# Patient Record
Sex: Male | Born: 1935 | Race: White | Hispanic: No | Marital: Single | Smoking: Current every day smoker
Health system: Southern US, Community
[De-identification: ages and names within clinical notes are randomized; demographics above are authoritative.]

## PROBLEM LIST (undated history)

## (undated) VITALS — BP 98/66 | HR 51 | Temp 98.1°F | Resp 16 | Ht 75.0 in | Wt 163.0 lb

## (undated) DIAGNOSIS — F329 Major depressive disorder, single episode, unspecified: Secondary | ICD-10-CM

## (undated) DIAGNOSIS — I2109 ST elevation (STEMI) myocardial infarction involving other coronary artery of anterior wall: Secondary | ICD-10-CM

## (undated) DIAGNOSIS — K8051 Calculus of bile duct without cholangitis or cholecystitis with obstruction: Secondary | ICD-10-CM

## (undated) DIAGNOSIS — G4700 Insomnia, unspecified: Secondary | ICD-10-CM

## (undated) DIAGNOSIS — K222 Esophageal obstruction: Secondary | ICD-10-CM

## (undated) DIAGNOSIS — H9190 Unspecified hearing loss, unspecified ear: Secondary | ICD-10-CM

## (undated) DIAGNOSIS — F0390 Unspecified dementia without behavioral disturbance: Secondary | ICD-10-CM

## (undated) DIAGNOSIS — F32A Depression, unspecified: Secondary | ICD-10-CM

## (undated) DIAGNOSIS — T1491XA Suicide attempt, initial encounter: Secondary | ICD-10-CM

## (undated) DIAGNOSIS — K2980 Duodenitis without bleeding: Secondary | ICD-10-CM

## (undated) DIAGNOSIS — I255 Ischemic cardiomyopathy: Secondary | ICD-10-CM

## (undated) DIAGNOSIS — K2981 Duodenitis with bleeding: Secondary | ICD-10-CM

## (undated) DIAGNOSIS — F419 Anxiety disorder, unspecified: Secondary | ICD-10-CM

## (undated) DIAGNOSIS — K2210 Ulcer of esophagus without bleeding: Secondary | ICD-10-CM

## (undated) DIAGNOSIS — K311 Adult hypertrophic pyloric stenosis: Secondary | ICD-10-CM

## (undated) DIAGNOSIS — N210 Calculus in bladder: Secondary | ICD-10-CM

## (undated) HISTORY — PX: BACK SURGERY: SHX140

## (undated) HISTORY — DX: Unspecified dementia, unspecified severity, without behavioral disturbance, psychotic disturbance, mood disturbance, and anxiety: F03.90

## (undated) HISTORY — PX: APPENDECTOMY: SHX54

---

## 2009-10-01 ENCOUNTER — Ambulatory Visit: Payer: Self-pay | Admitting: Cardiology

## 2009-10-01 ENCOUNTER — Observation Stay (HOSPITAL_COMMUNITY): Admission: EM | Admit: 2009-10-01 | Discharge: 2009-10-02 | Payer: Self-pay | Admitting: Emergency Medicine

## 2009-10-04 ENCOUNTER — Other Ambulatory Visit: Payer: Self-pay | Admitting: Emergency Medicine

## 2009-10-05 ENCOUNTER — Ambulatory Visit: Payer: Self-pay | Admitting: Psychiatry

## 2009-10-05 ENCOUNTER — Inpatient Hospital Stay (HOSPITAL_COMMUNITY): Admission: AD | Admit: 2009-10-05 | Discharge: 2009-10-12 | Payer: Self-pay | Admitting: Psychiatry

## 2009-10-23 ENCOUNTER — Inpatient Hospital Stay (HOSPITAL_COMMUNITY): Admission: AC | Admit: 2009-10-23 | Discharge: 2009-10-26 | Payer: Self-pay

## 2009-10-23 DIAGNOSIS — F331 Major depressive disorder, recurrent, moderate: Secondary | ICD-10-CM

## 2009-10-25 ENCOUNTER — Ambulatory Visit: Payer: Self-pay | Admitting: Psychiatry

## 2009-10-26 ENCOUNTER — Ambulatory Visit: Payer: Self-pay | Admitting: Psychiatry

## 2009-10-26 ENCOUNTER — Inpatient Hospital Stay (HOSPITAL_COMMUNITY): Admission: AD | Admit: 2009-10-26 | Discharge: 2009-11-20 | Payer: Self-pay | Admitting: Psychiatry

## 2010-02-07 ENCOUNTER — Inpatient Hospital Stay (HOSPITAL_COMMUNITY): Admission: EM | Admit: 2010-02-07 | Discharge: 2010-02-08 | Payer: Self-pay | Admitting: Emergency Medicine

## 2010-02-07 ENCOUNTER — Ambulatory Visit: Payer: Self-pay | Admitting: Cardiology

## 2010-02-08 ENCOUNTER — Encounter (INDEPENDENT_AMBULATORY_CARE_PROVIDER_SITE_OTHER): Payer: Self-pay | Admitting: Internal Medicine

## 2010-07-29 LAB — BASIC METABOLIC PANEL
Calcium: 8.5 mg/dL (ref 8.4–10.5)
Chloride: 107 mEq/L (ref 96–112)
Creatinine, Ser: 1.05 mg/dL (ref 0.4–1.5)
GFR calc non Af Amer: >60 mL/min (ref >60)
Potassium: 4.1 mEq/L (ref 3.5–5.1)
Sodium: 140 mEq/L (ref 135–145)

## 2010-07-29 LAB — COMPREHENSIVE METABOLIC PANEL
Alkaline Phosphatase: 94 U/L (ref 39–117)
BUN: 9 mg/dL (ref 6–23)
CO2: 25 mEq/L (ref 19–32)
Chloride: 104 mEq/L (ref 96–112)
Creatinine, Ser: 1.3 mg/dL (ref 0.4–1.5)
GFR calc Af Amer: >60 mL/min (ref >60)
GFR calc non Af Amer: 54 mL/min — ABNORMAL LOW (ref >60)
Glucose, Bld: 134 mg/dL — ABNORMAL HIGH (ref 70–99)
Sodium: 136 mEq/L (ref 135–145)
Total Bilirubin: 0.4 mg/dL (ref 0.3–1.2)
Total Protein: 7.1 g/dL (ref 6.0–8.3)

## 2010-07-29 LAB — T4, FREE: Free T4: 0.81 ng/dL (ref 0.80–1.80)

## 2010-07-29 LAB — RAPID URINE DRUG SCREEN, HOSP PERFORMED
Amphetamines: NOT DETECTED
Barbiturates: NOT DETECTED
Benzodiazepines: POSITIVE — AB
Opiates: NOT DETECTED
Tetrahydrocannabinol: NOT DETECTED

## 2010-07-29 LAB — CBC
HCT: 33.8 % — ABNORMAL LOW (ref 39.0–52.0)
HCT: 38 % — ABNORMAL LOW (ref 39.0–52.0)
Hemoglobin: 11.8 g/dL — ABNORMAL LOW (ref 13.0–17.0)
Hemoglobin: 13.1 g/dL (ref 13.0–17.0)
MCH: 33 pg (ref 26.0–34.0)
MCHC: 34.9 g/dL (ref 30.0–36.0)
MCV: 94.7 fL (ref 78.0–100.0)
MCV: 95.1 fL (ref 78.0–100.0)
Platelets: 187 10*3/uL (ref 150–400)
Platelets: 216 10*3/uL (ref 150–400)
RDW: 12.7 % (ref 11.5–15.5)
WBC: 10.2 10*3/uL (ref 4.0–10.5)
WBC: 5.7 10*3/uL (ref 4.0–10.5)

## 2010-07-29 LAB — CK TOTAL AND CKMB (NOT AT ARMC)
CK, MB: 0.8 ng/mL (ref 0.3–4.0)
Relative Index: INVALID (ref 0.0–2.5)
Total CK: 54 U/L (ref 7–232)

## 2010-07-29 LAB — CARDIAC PANEL(CRET KIN+CKTOT+MB+TROPI)
CK, MB: 0.8 ng/mL (ref 0.3–4.0)
Relative Index: INVALID (ref 0.0–2.5)
Relative Index: INVALID (ref 0.0–2.5)
Total CK: 46 U/L (ref 7–232)
Troponin I: 0.02 ng/mL (ref 0.00–0.06)
Troponin I: <0.01 ng/mL (ref 0.00–0.06)

## 2010-07-29 LAB — URINALYSIS, ROUTINE W REFLEX MICROSCOPIC
Bilirubin Urine: NEGATIVE
Glucose, UA: NEGATIVE mg/dL
Hgb urine dipstick: NEGATIVE
Nitrite: NEGATIVE
Protein, ur: NEGATIVE mg/dL
Urobilinogen, UA: 0.2 mg/dL (ref 0.0–1.0)
pH: 6.5 (ref 5.0–8.0)

## 2010-07-29 LAB — VITAMIN B12: Vitamin B-12: 310 pg/mL (ref 211–911)

## 2010-07-29 LAB — DIFFERENTIAL
Basophils Absolute: 0 10*3/uL (ref 0.0–0.1)
Basophils Relative: 0 % (ref 0–1)
Eosinophils Relative: 2 % (ref 0–5)
Monocytes Absolute: 0.6 10*3/uL (ref 0.1–1.0)
Monocytes Relative: 11 % (ref 3–12)
Neutro Abs: 3.2 10*3/uL (ref 1.7–7.7)

## 2010-07-29 LAB — MAGNESIUM: Magnesium: 2.1 mg/dL (ref 1.5–2.5)

## 2010-07-29 LAB — HEMOGLOBIN A1C: Hgb A1c MFr Bld: 5.6 % (ref <5.7)

## 2010-07-29 LAB — LIPID PANEL
HDL: 31 mg/dL — ABNORMAL LOW (ref >39)
Total CHOL/HDL Ratio: 4 RATIO
Triglycerides: 154 mg/dL — ABNORMAL HIGH (ref <150)

## 2010-07-29 LAB — POCT CARDIAC MARKERS: CKMB, poc: <1 ng/mL — ABNORMAL LOW (ref 1.0–8.0)

## 2010-07-29 LAB — TROPONIN I: Troponin I: <0.01 ng/mL (ref 0.00–0.06)

## 2010-08-01 LAB — COMPREHENSIVE METABOLIC PANEL
BUN: 17 mg/dL (ref 6–23)
CO2: 27 mEq/L (ref 19–32)
Chloride: 106 mEq/L (ref 96–112)
Creatinine, Ser: 1.06 mg/dL (ref 0.4–1.5)
GFR calc non Af Amer: >60 mL/min (ref >60)
Glucose, Bld: 115 mg/dL — ABNORMAL HIGH (ref 70–99)
Potassium: 4 mEq/L (ref 3.5–5.1)
Total Bilirubin: 0.6 mg/dL (ref 0.3–1.2)

## 2010-08-01 LAB — CBC
HCT: 34.2 % — ABNORMAL LOW (ref 39.0–52.0)
Hemoglobin: 12.3 g/dL — ABNORMAL LOW (ref 13.0–17.0)
MCHC: 35.8 g/dL (ref 30.0–36.0)
MCV: 93.4 fL (ref 78.0–100.0)
Platelets: 225 10*3/uL (ref 150–400)
RBC: 3.66 MIL/uL — ABNORMAL LOW (ref 4.22–5.81)
RDW: 12.4 % (ref 11.5–15.5)
WBC: 6.7 10*3/uL (ref 4.0–10.5)

## 2010-08-01 LAB — TSH: TSH: 5.12 u[IU]/mL — ABNORMAL HIGH (ref 0.350–4.500)

## 2010-08-02 LAB — COMPREHENSIVE METABOLIC PANEL
ALT: 19 U/L (ref 0–53)
ALT: 15 U/L (ref 0–53)
AST: 23 U/L (ref 0–37)
AST: 21 U/L (ref 0–37)
Albumin: 3.7 g/dL (ref 3.5–5.2)
Alkaline Phosphatase: 100 U/L (ref 39–117)
Alkaline Phosphatase: 95 U/L (ref 39–117)
CO2: 29 mEq/L (ref 19–32)
Calcium: 8.4 mg/dL (ref 8.4–10.5)
Chloride: 103 mEq/L (ref 96–112)
Creatinine, Ser: 1.1 mg/dL (ref 0.4–1.5)
GFR calc Af Amer: >60 mL/min (ref >60)
GFR calc Af Amer: >60 mL/min (ref >60)
GFR calc non Af Amer: >60 mL/min (ref >60)
Potassium: 4.1 mEq/L (ref 3.5–5.1)
Potassium: 3.7 mEq/L (ref 3.5–5.1)
Sodium: 140 mEq/L (ref 135–145)
Sodium: 137 mEq/L (ref 135–145)
Total Bilirubin: 0.4 mg/dL (ref 0.3–1.2)
Total Bilirubin: 0.4 mg/dL (ref 0.3–1.2)
Total Protein: 6.5 g/dL (ref 6.0–8.3)

## 2010-08-02 LAB — URINALYSIS, ROUTINE W REFLEX MICROSCOPIC
Bilirubin Urine: NEGATIVE
Bilirubin Urine: NEGATIVE
Ketones, ur: NEGATIVE mg/dL
Ketones, ur: NEGATIVE mg/dL
Leukocytes, UA: NEGATIVE
Leukocytes, UA: NEGATIVE
Nitrite: NEGATIVE
Nitrite: NEGATIVE
Protein, ur: NEGATIVE mg/dL
Protein, ur: NEGATIVE mg/dL
Urobilinogen, UA: 0.2 mg/dL (ref 0.0–1.0)
pH: 6.5 (ref 5.0–8.0)

## 2010-08-02 LAB — DIFFERENTIAL
Basophils Absolute: 0 10*3/uL (ref 0.0–0.1)
Basophils Relative: 0 % (ref 0–1)
Basophils Relative: 1 % (ref 0–1)
Eosinophils Absolute: 0.1 10*3/uL (ref 0.0–0.7)
Eosinophils Absolute: 0.1 10*3/uL (ref 0.0–0.7)
Eosinophils Relative: 1 % (ref 0–5)
Eosinophils Relative: 1 % (ref 0–5)
Lymphocytes Relative: 23 % (ref 12–46)
Monocytes Absolute: 0.8 10*3/uL (ref 0.1–1.0)
Monocytes Relative: 10 % (ref 3–12)
Neutro Abs: 3.6 10*3/uL (ref 1.7–7.7)
Neutrophils Relative %: 56 % (ref 43–77)

## 2010-08-02 LAB — MRSA PCR SCREENING: MRSA by PCR: NEGATIVE

## 2010-08-02 LAB — RAPID URINE DRUG SCREEN, HOSP PERFORMED
Amphetamines: NOT DETECTED
Amphetamines: NOT DETECTED
Benzodiazepines: POSITIVE — AB
Cocaine: NOT DETECTED
Opiates: NOT DETECTED
Tetrahydrocannabinol: NOT DETECTED

## 2010-08-02 LAB — CBC
HCT: 39.9 % (ref 39.0–52.0)
Hemoglobin: 14.1 g/dL (ref 13.0–17.0)
MCHC: 35.2 g/dL (ref 30.0–36.0)
MCV: 92.7 fL (ref 78.0–100.0)
MCV: 93.8 fL (ref 78.0–100.0)
Platelets: 225 10*3/uL (ref 150–400)
Platelets: 253 10*3/uL (ref 150–400)
RBC: 4.01 MIL/uL — ABNORMAL LOW (ref 4.22–5.81)
RBC: 4.26 MIL/uL (ref 4.22–5.81)
RDW: 12.7 % (ref 11.5–15.5)
WBC: 6.5 10*3/uL (ref 4.0–10.5)
WBC: 7.3 10*3/uL (ref 4.0–10.5)

## 2010-08-02 LAB — BASIC METABOLIC PANEL
BUN: 8 mg/dL (ref 6–23)
Calcium: 9.1 mg/dL (ref 8.4–10.5)
Chloride: 104 mEq/L (ref 96–112)
Creatinine, Ser: 1.13 mg/dL (ref 0.4–1.5)
GFR calc Af Amer: >60 mL/min (ref >60)
Glucose, Bld: 118 mg/dL — ABNORMAL HIGH (ref 70–99)
Sodium: 139 mEq/L (ref 135–145)

## 2010-08-02 LAB — SAMPLE TO BLOOD BANK

## 2010-08-02 LAB — PROTIME-INR: INR: 1 (ref 0.00–1.49)

## 2010-08-02 LAB — TSH: TSH: 2.727 u[IU]/mL (ref 0.350–4.500)

## 2010-08-02 LAB — POCT I-STAT, CHEM 8
BUN: 8 mg/dL (ref 6–23)
Creatinine, Ser: 1.1 mg/dL (ref 0.4–1.5)
Hemoglobin: 12.9 g/dL — ABNORMAL LOW (ref 13.0–17.0)
Potassium: 4.2 meq/L (ref 3.5–5.1)
Sodium: 142 meq/L (ref 135–145)
TCO2: 28 mmol/L (ref 0–100)

## 2010-08-02 LAB — POCT CARDIAC MARKERS
CKMB, poc: 1 ng/mL (ref 1.0–8.0)
Troponin i, poc: <0.05 ng/mL (ref 0.00–0.09)

## 2010-08-02 LAB — ETHANOL: Alcohol, Ethyl (B): <5 mg/dL (ref 0–10)

## 2010-08-02 LAB — URINE MICROSCOPIC-ADD ON

## 2011-01-24 ENCOUNTER — Emergency Department (HOSPITAL_COMMUNITY): Payer: Medicare Other

## 2011-01-24 ENCOUNTER — Emergency Department (HOSPITAL_COMMUNITY)
Admission: EM | Admit: 2011-01-24 | Discharge: 2011-01-25 | Disposition: A | Discharge status: Home / Self Care | Payer: Medicare Other | Attending: Emergency Medicine | Admitting: Emergency Medicine

## 2011-01-24 ENCOUNTER — Encounter: Payer: Self-pay | Admitting: *Deleted

## 2011-01-24 ENCOUNTER — Other Ambulatory Visit: Payer: Self-pay

## 2011-01-24 DIAGNOSIS — S0093XA Contusion of unspecified part of head, initial encounter: Secondary | ICD-10-CM

## 2011-01-24 DIAGNOSIS — S0083XA Contusion of other part of head, initial encounter: Secondary | ICD-10-CM | POA: Insufficient documentation

## 2011-01-24 DIAGNOSIS — R0602 Shortness of breath: Secondary | ICD-10-CM | POA: Insufficient documentation

## 2011-01-24 DIAGNOSIS — R42 Dizziness and giddiness: Secondary | ICD-10-CM | POA: Insufficient documentation

## 2011-01-24 DIAGNOSIS — Y92009 Unspecified place in unspecified non-institutional (private) residence as the place of occurrence of the external cause: Secondary | ICD-10-CM | POA: Insufficient documentation

## 2011-01-24 DIAGNOSIS — E860 Dehydration: Secondary | ICD-10-CM

## 2011-01-24 DIAGNOSIS — M25539 Pain in unspecified wrist: Secondary | ICD-10-CM | POA: Insufficient documentation

## 2011-01-24 DIAGNOSIS — R509 Fever, unspecified: Secondary | ICD-10-CM | POA: Insufficient documentation

## 2011-01-24 DIAGNOSIS — S50312A Abrasion of left elbow, initial encounter: Secondary | ICD-10-CM

## 2011-01-24 DIAGNOSIS — S0003XA Contusion of scalp, initial encounter: Secondary | ICD-10-CM | POA: Insufficient documentation

## 2011-01-24 DIAGNOSIS — S5000XA Contusion of unspecified elbow, initial encounter: Secondary | ICD-10-CM | POA: Insufficient documentation

## 2011-01-24 DIAGNOSIS — S60812A Abrasion of left wrist, initial encounter: Secondary | ICD-10-CM

## 2011-01-24 DIAGNOSIS — M25529 Pain in unspecified elbow: Secondary | ICD-10-CM | POA: Insufficient documentation

## 2011-01-24 DIAGNOSIS — W19XXXA Unspecified fall, initial encounter: Secondary | ICD-10-CM | POA: Insufficient documentation

## 2011-01-24 HISTORY — DX: Major depressive disorder, single episode, unspecified: F32.9

## 2011-01-24 HISTORY — DX: Anxiety disorder, unspecified: F41.9

## 2011-01-24 HISTORY — DX: Insomnia, unspecified: G47.00

## 2011-01-24 HISTORY — DX: Depression, unspecified: F32.A

## 2011-01-24 LAB — CBC
Hemoglobin: 13.4 g/dL (ref 13.0–17.0)
MCH: 33.1 pg (ref 26.0–34.0)
Platelets: 218 10*3/uL (ref 150–400)
RBC: 4.05 MIL/uL — ABNORMAL LOW (ref 4.22–5.81)
WBC: 8.3 10*3/uL (ref 4.0–10.5)

## 2011-01-24 LAB — COMPREHENSIVE METABOLIC PANEL
ALT: 10 U/L (ref 0–53)
AST: 16 U/L (ref 0–37)
Albumin: 3.7 g/dL (ref 3.5–5.2)
Alkaline Phosphatase: 125 U/L — ABNORMAL HIGH (ref 39–117)
CO2: 27 mEq/L (ref 19–32)
Calcium: 9 mg/dL (ref 8.4–10.5)
Chloride: 102 mEq/L (ref 96–112)
GFR calc Af Amer: 55 mL/min — ABNORMAL LOW (ref >60)
GFR calc non Af Amer: 45 mL/min — ABNORMAL LOW (ref >60)
Glucose, Bld: 91 mg/dL (ref 70–99)
Potassium: 3.8 mEq/L (ref 3.5–5.1)
Sodium: 138 mEq/L (ref 135–145)
Total Bilirubin: 0.2 mg/dL — ABNORMAL LOW (ref 0.3–1.2)

## 2011-01-24 LAB — URINALYSIS, ROUTINE W REFLEX MICROSCOPIC
Bilirubin Urine: NEGATIVE
Hgb urine dipstick: NEGATIVE
Ketones, ur: NEGATIVE mg/dL
Nitrite: NEGATIVE
Protein, ur: NEGATIVE mg/dL
Specific Gravity, Urine: >1.03 — ABNORMAL HIGH (ref 1.005–1.030)
Urobilinogen, UA: 0.2 mg/dL (ref 0.0–1.0)

## 2011-01-24 LAB — POCT I-STAT TROPONIN I: Troponin i, poc: 0 ng/mL (ref 0.00–0.08)

## 2011-01-24 MED ORDER — ONDANSETRON HCL 4 MG/2ML IJ SOLN
4.0000 mg | Freq: Once | INTRAMUSCULAR | Status: AC
  Administered 2011-01-24: 4 mg via INTRAVENOUS
  Filled 2011-01-24: qty 2

## 2011-01-24 MED ORDER — ONDANSETRON HCL 4 MG PO TABS: 4.0000 mg | ORAL_TABLET | Freq: Four times a day (QID) | ORAL | Status: AC

## 2011-01-24 MED ORDER — SODIUM CHLORIDE 0.9 % IV BOLUS (SEPSIS)
1000.0000 mL | Freq: Once | INTRAVENOUS | Status: AC
  Administered 2011-01-24: 1000 mL via INTRAVENOUS

## 2011-01-24 MED ORDER — SODIUM CHLORIDE 0.9 % IV BOLUS (SEPSIS)
500.0000 mL | Freq: Once | INTRAVENOUS | Status: AC
  Administered 2011-01-24: 500 mL via INTRAVENOUS

## 2011-01-24 MED ORDER — ONDANSETRON HCL 4 MG/2ML IJ SOLN: 4.0000 mg | Freq: Four times a day (QID) | INTRAMUSCULAR | Status: DC | PRN
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
## 2011-01-24 NOTE — ED Notes (Signed)
Pt returned from xray. Pt states still unable to get urine sample at this time. Bed in low position, side rails up x2. NAD noted & no needs voiced at this time.

## 2011-01-24 NOTE — ED Notes (Signed)
Pt able to drink w/ no problems. No nausea reported. Family at bedside. Bed in low position, side rails up x2. NAD noted & no needs voiced at this time.

## 2011-01-24 NOTE — ED Notes (Addendum)
Pt complains of having dizziess w/ standing. Denies dizziness now. Having some diarrhea. Loss of appitite & being tired.
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
## 2011-01-24 NOTE — ED Notes (Signed)
Cleaned wounds on hand, elbow and ear. Wounds are so old that they have scabbed over and cleaned as well as could be without taking off scab

## 2012-07-22 ENCOUNTER — Inpatient Hospital Stay (HOSPITAL_COMMUNITY)
Admission: EM | Admit: 2012-07-22 | Discharge: 2012-07-26 | DRG: 445 | Disposition: A | Discharge status: Other Acute Inpatient Hospital | Payer: Medicare Other | Attending: Internal Medicine | Admitting: Internal Medicine

## 2012-07-22 ENCOUNTER — Emergency Department (HOSPITAL_COMMUNITY): Payer: Medicare Other

## 2012-07-22 ENCOUNTER — Other Ambulatory Visit: Payer: Self-pay

## 2012-07-22 ENCOUNTER — Encounter (HOSPITAL_COMMUNITY): Payer: Self-pay

## 2012-07-22 DIAGNOSIS — R109 Unspecified abdominal pain: Secondary | ICD-10-CM

## 2012-07-22 DIAGNOSIS — R7402 Elevation of levels of lactic acid dehydrogenase (LDH): Secondary | ICD-10-CM | POA: Diagnosis present

## 2012-07-22 DIAGNOSIS — F32A Depression, unspecified: Secondary | ICD-10-CM | POA: Diagnosis present

## 2012-07-22 DIAGNOSIS — R45851 Suicidal ideations: Secondary | ICD-10-CM

## 2012-07-22 DIAGNOSIS — K8051 Calculus of bile duct without cholangitis or cholecystitis with obstruction: Principal | ICD-10-CM | POA: Diagnosis present

## 2012-07-22 DIAGNOSIS — Z79899 Other long term (current) drug therapy: Secondary | ICD-10-CM

## 2012-07-22 DIAGNOSIS — K311 Adult hypertrophic pyloric stenosis: Secondary | ICD-10-CM | POA: Diagnosis present

## 2012-07-22 DIAGNOSIS — K838 Other specified diseases of biliary tract: Secondary | ICD-10-CM | POA: Diagnosis present

## 2012-07-22 DIAGNOSIS — R079 Chest pain, unspecified: Secondary | ICD-10-CM | POA: Diagnosis present

## 2012-07-22 DIAGNOSIS — K2980 Duodenitis without bleeding: Secondary | ICD-10-CM | POA: Diagnosis present

## 2012-07-22 DIAGNOSIS — Z66 Do not resuscitate: Secondary | ICD-10-CM | POA: Diagnosis present

## 2012-07-22 DIAGNOSIS — F1010 Alcohol abuse, uncomplicated: Secondary | ICD-10-CM | POA: Diagnosis present

## 2012-07-22 DIAGNOSIS — F329 Major depressive disorder, single episode, unspecified: Secondary | ICD-10-CM | POA: Diagnosis present

## 2012-07-22 DIAGNOSIS — K2210 Ulcer of esophagus without bleeding: Secondary | ICD-10-CM | POA: Diagnosis present

## 2012-07-22 DIAGNOSIS — K831 Obstruction of bile duct: Secondary | ICD-10-CM | POA: Diagnosis present

## 2012-07-22 DIAGNOSIS — F3289 Other specified depressive episodes: Secondary | ICD-10-CM | POA: Diagnosis present

## 2012-07-22 DIAGNOSIS — K208 Other esophagitis without bleeding: Secondary | ICD-10-CM | POA: Diagnosis present

## 2012-07-22 DIAGNOSIS — R0789 Other chest pain: Secondary | ICD-10-CM | POA: Diagnosis present

## 2012-07-22 DIAGNOSIS — Z23 Encounter for immunization: Secondary | ICD-10-CM

## 2012-07-22 DIAGNOSIS — F17200 Nicotine dependence, unspecified, uncomplicated: Secondary | ICD-10-CM | POA: Diagnosis present

## 2012-07-22 DIAGNOSIS — R7401 Elevation of levels of liver transaminase levels: Secondary | ICD-10-CM | POA: Diagnosis present

## 2012-07-22 HISTORY — DX: Suicide attempt, initial encounter: T14.91XA

## 2012-07-22 HISTORY — DX: Ulcer of esophagus without bleeding: K22.10

## 2012-07-22 HISTORY — DX: Adult hypertrophic pyloric stenosis: K31.1

## 2012-07-22 LAB — CBC WITH DIFFERENTIAL/PLATELET
Basophils Absolute: 0 10*3/uL (ref 0.0–0.1)
Basophils Relative: 0 % (ref 0–1)
Eosinophils Absolute: 0 10*3/uL (ref 0.0–0.7)
Eosinophils Relative: 1 % (ref 0–5)
HCT: 37.3 % — ABNORMAL LOW (ref 39.0–52.0)
Hemoglobin: 12.9 g/dL — ABNORMAL LOW (ref 13.0–17.0)
MCH: 33.2 pg (ref 26.0–34.0)
MCHC: 34.6 g/dL (ref 30.0–36.0)
MCV: 96.1 fL (ref 78.0–100.0)
Monocytes Absolute: 0.7 10*3/uL (ref 0.1–1.0)
Monocytes Relative: 11 % (ref 3–12)
Neutro Abs: 4.3 10*3/uL (ref 1.7–7.7)
Platelets: 204 10*3/uL (ref 150–400)
RDW: 12.9 % (ref 11.5–15.5)

## 2012-07-22 LAB — COMPREHENSIVE METABOLIC PANEL
AST: 112 U/L — ABNORMAL HIGH (ref 0–37)
Albumin: 3.4 g/dL — ABNORMAL LOW (ref 3.5–5.2)
BUN: 12 mg/dL (ref 6–23)
Calcium: 9 mg/dL (ref 8.4–10.5)
Creatinine, Ser: 1.12 mg/dL (ref 0.50–1.35)
Total Protein: 7.1 g/dL (ref 6.0–8.3)

## 2012-07-22 LAB — RAPID URINE DRUG SCREEN, HOSP PERFORMED
Amphetamines: NOT DETECTED
Barbiturates: NOT DETECTED
Cocaine: NOT DETECTED
Opiates: NOT DETECTED
Tetrahydrocannabinol: NOT DETECTED

## 2012-07-22 LAB — TROPONIN I
Troponin I: <0.3 ng/mL (ref <0.30)
Troponin I: <0.3 ng/mL (ref <0.30)

## 2012-07-22 LAB — AMMONIA: Ammonia: 25 umol/L (ref 11–60)

## 2012-07-22 LAB — URINALYSIS, ROUTINE W REFLEX MICROSCOPIC
Glucose, UA: NEGATIVE mg/dL
Ketones, ur: NEGATIVE mg/dL
Leukocytes, UA: NEGATIVE
Protein, ur: 30 mg/dL — AB
Urobilinogen, UA: 0.2 mg/dL (ref 0.0–1.0)

## 2012-07-22 LAB — ETHANOL: Alcohol, Ethyl (B): <11 mg/dL (ref 0–11)

## 2012-07-22 LAB — URINE MICROSCOPIC-ADD ON

## 2012-07-22 MED ORDER — SODIUM CHLORIDE 0.9 % IV BOLUS (SEPSIS)
500.0000 mL | Freq: Once | INTRAVENOUS | Status: AC
  Administered 2012-07-22: 500 mL via INTRAVENOUS

## 2012-07-22 MED ORDER — SODIUM CHLORIDE 0.9 % IV SOLN
INTRAVENOUS | Status: DC
  Administered 2012-07-22 – 2012-07-23 (×2): via INTRAVENOUS

## 2012-07-22 NOTE — ED Notes (Signed)
Pt placed in blue paper scrub bottoms because he wants to get up and walk around his room and he has no bottoms on.

## 2012-07-22 NOTE — ED Notes (Signed)
Patient given PM food tray 
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
## 2012-07-22 NOTE — ED Notes (Signed)
The patient eats only bites of his tray, states that he does not have much of an appetite.
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
## 2012-07-22 NOTE — ED Notes (Signed)
Pt reports being more depressed and having more "thoughts about hurting self and others", tearful in triage. Hard to get to answer ?'s. Stated I think I just reached my limit and I need to get help.   Also reports cp for 3 days off/on, sob at times w/ pain,, +cough denies any fever.

## 2012-07-22 NOTE — ED Notes (Signed)
States that she is having some nausea, states he wants medication for his stomach.

## 2012-07-22 NOTE — ED Notes (Signed)
Pt sleeping. 

## 2012-07-22 NOTE — ED Notes (Signed)
Pt seeing birds in his room and wanted sitter to come kill it. Pt also says "I'm going straight to hell to convert them people."

## 2012-07-22 NOTE — ED Notes (Signed)
The patient states that he would rather live on the street than to go back to his current situation at home.  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

## 2012-07-23 ENCOUNTER — Emergency Department (HOSPITAL_COMMUNITY): Payer: Medicare Other

## 2012-07-23 ENCOUNTER — Encounter (HOSPITAL_COMMUNITY): Payer: Self-pay | Admitting: *Deleted

## 2012-07-23 DIAGNOSIS — K8051 Calculus of bile duct without cholangitis or cholecystitis with obstruction: Secondary | ICD-10-CM

## 2012-07-23 DIAGNOSIS — F1010 Alcohol abuse, uncomplicated: Secondary | ICD-10-CM

## 2012-07-23 DIAGNOSIS — F32A Depression, unspecified: Secondary | ICD-10-CM | POA: Diagnosis present

## 2012-07-23 DIAGNOSIS — K831 Obstruction of bile duct: Secondary | ICD-10-CM | POA: Diagnosis present

## 2012-07-23 DIAGNOSIS — R45851 Suicidal ideations: Secondary | ICD-10-CM

## 2012-07-23 DIAGNOSIS — K2100 Gastro-esophageal reflux disease with esophagitis, without bleeding: Secondary | ICD-10-CM

## 2012-07-23 DIAGNOSIS — F329 Major depressive disorder, single episode, unspecified: Secondary | ICD-10-CM | POA: Diagnosis present

## 2012-07-23 DIAGNOSIS — F3289 Other specified depressive episodes: Secondary | ICD-10-CM

## 2012-07-23 DIAGNOSIS — R079 Chest pain, unspecified: Secondary | ICD-10-CM

## 2012-07-23 DIAGNOSIS — K838 Other specified diseases of biliary tract: Secondary | ICD-10-CM

## 2012-07-23 DIAGNOSIS — K5710 Diverticulosis of small intestine without perforation or abscess without bleeding: Secondary | ICD-10-CM

## 2012-07-23 LAB — CBC
Hemoglobin: 13.2 g/dL (ref 13.0–17.0)
MCH: 32.8 pg (ref 26.0–34.0)
MCV: 96 fL (ref 78.0–100.0)
RBC: 4.03 MIL/uL — ABNORMAL LOW (ref 4.22–5.81)
WBC: 5.5 10*3/uL (ref 4.0–10.5)

## 2012-07-23 LAB — TROPONIN I
Troponin I: <0.3 ng/mL (ref <0.30)
Troponin I: <0.3 ng/mL (ref <0.30)

## 2012-07-23 LAB — CREATININE, SERUM
Creatinine, Ser: 1.02 mg/dL (ref 0.50–1.35)
GFR calc Af Amer: 79 mL/min — ABNORMAL LOW (ref >90)

## 2012-07-23 LAB — PROTIME-INR
INR: 0.98 (ref 0.00–1.49)
Prothrombin Time: 12.9 seconds (ref 11.6–15.2)

## 2012-07-23 MED ORDER — LORAZEPAM 1 MG PO TABS
1.0000 mg | ORAL_TABLET | Freq: Three times a day (TID) | ORAL | Status: DC | PRN
  Administered 2012-07-23: 1 mg via ORAL
  Filled 2012-07-23: qty 1

## 2012-07-23 MED ORDER — ENOXAPARIN SODIUM 40 MG/0.4ML ~~LOC~~ SOLN
40.0000 mg | SUBCUTANEOUS | Status: DC
  Administered 2012-07-23: 40 mg via SUBCUTANEOUS
  Filled 2012-07-23: qty 0.4

## 2012-07-23 MED ORDER — ACETAMINOPHEN 650 MG RE SUPP: 650.0000 mg | Freq: Four times a day (QID) | RECTAL | Status: DC | PRN

## 2012-07-23 MED ORDER — MIRTAZAPINE 30 MG PO TABS
ORAL_TABLET | ORAL | Status: AC
  Filled 2012-07-23: qty 1

## 2012-07-23 MED ORDER — LORAZEPAM 2 MG/ML IJ SOLN: 1.0000 mg | Freq: Four times a day (QID) | INTRAMUSCULAR | Status: AC | PRN

## 2012-07-23 MED ORDER — VITAMIN B-1 100 MG PO TABS
100.0000 mg | ORAL_TABLET | Freq: Every day | ORAL | Status: DC
  Administered 2012-07-23 – 2012-07-26 (×4): 100 mg via ORAL
  Filled 2012-07-23 (×4): qty 1

## 2012-07-23 MED ORDER — ACETAMINOPHEN 325 MG PO TABS: 650.0000 mg | ORAL_TABLET | Freq: Four times a day (QID) | ORAL | Status: DC | PRN

## 2012-07-23 MED ORDER — ACETAMINOPHEN 325 MG PO TABS: 650.0000 mg | ORAL_TABLET | ORAL | Status: DC | PRN

## 2012-07-23 MED ORDER — CLONAZEPAM 0.5 MG PO TABS: 0.5000 mg | ORAL_TABLET | Freq: Three times a day (TID) | ORAL | Status: DC | PRN

## 2012-07-23 MED ORDER — MIRTAZAPINE 30 MG PO TABS
30.0000 mg | ORAL_TABLET | Freq: Every day | ORAL | Status: DC
  Administered 2012-07-23 – 2012-07-25 (×3): 30 mg via ORAL
  Filled 2012-07-23 (×3): qty 1

## 2012-07-23 MED ORDER — METRONIDAZOLE IN NACL 5-0.79 MG/ML-% IV SOLN
500.0000 mg | Freq: Three times a day (TID) | INTRAVENOUS | Status: DC
  Administered 2012-07-23 – 2012-07-26 (×10): 500 mg via INTRAVENOUS
  Filled 2012-07-23 (×13): qty 100

## 2012-07-23 MED ORDER — IOHEXOL 300 MG/ML  SOLN: 50.0000 mL | Freq: Once | INTRAMUSCULAR | Status: DC | PRN

## 2012-07-23 MED ORDER — ONDANSETRON HCL 4 MG PO TABS: 4.0000 mg | ORAL_TABLET | Freq: Four times a day (QID) | ORAL | Status: DC | PRN

## 2012-07-23 MED ORDER — QUETIAPINE FUMARATE 100 MG PO TABS
ORAL_TABLET | ORAL | Status: AC
  Filled 2012-07-23: qty 1

## 2012-07-23 MED ORDER — SODIUM CHLORIDE 0.9 % IV SOLN: INTRAVENOUS | Status: DC

## 2012-07-23 MED ORDER — PNEUMOCOCCAL VAC POLYVALENT 25 MCG/0.5ML IJ INJ
0.5000 mL | INJECTION | INTRAMUSCULAR | Status: AC
  Administered 2012-07-24: 0.5 mL via INTRAMUSCULAR
  Filled 2012-07-23: qty 0.5

## 2012-07-23 MED ORDER — CITALOPRAM HYDROBROMIDE 20 MG PO TABS
ORAL_TABLET | ORAL | Status: AC
  Filled 2012-07-23: qty 1

## 2012-07-23 MED ORDER — PANTOPRAZOLE SODIUM 40 MG IV SOLR
40.0000 mg | INTRAVENOUS | Status: DC
  Administered 2012-07-23: 40 mg via INTRAVENOUS
  Filled 2012-07-23: qty 40

## 2012-07-23 MED ORDER — ADULT MULTIVITAMIN W/MINERALS CH
1.0000 | ORAL_TABLET | Freq: Every day | ORAL | Status: DC
  Administered 2012-07-23 – 2012-07-26 (×4): 1 via ORAL
  Filled 2012-07-23 (×4): qty 1

## 2012-07-23 MED ORDER — FOLIC ACID 1 MG PO TABS
1.0000 mg | ORAL_TABLET | Freq: Every day | ORAL | Status: DC
  Administered 2012-07-23 – 2012-07-26 (×4): 1 mg via ORAL
  Filled 2012-07-23 (×4): qty 1

## 2012-07-23 MED ORDER — ONDANSETRON HCL 4 MG/2ML IJ SOLN: 4.0000 mg | Freq: Four times a day (QID) | INTRAMUSCULAR | Status: DC | PRN

## 2012-07-23 MED ORDER — QUETIAPINE FUMARATE 100 MG PO TABS
100.0000 mg | ORAL_TABLET | Freq: Three times a day (TID) | ORAL | Status: DC
  Administered 2012-07-23 – 2012-07-26 (×10): 100 mg via ORAL
  Filled 2012-07-23 (×13): qty 1

## 2012-07-23 MED ORDER — CITALOPRAM HYDROBROMIDE 20 MG PO TABS
20.0000 mg | ORAL_TABLET | ORAL | Status: DC
  Administered 2012-07-23 – 2012-07-26 (×4): 20 mg via ORAL
  Filled 2012-07-23 (×4): qty 1

## 2012-07-23 MED ORDER — MIRTAZAPINE 30 MG PO TABS
30.0000 mg | ORAL_TABLET | Freq: Every day | ORAL | Status: DC
  Administered 2012-07-23: 30 mg via ORAL
  Filled 2012-07-23 (×2): qty 1

## 2012-07-23 MED ORDER — THIAMINE HCL 100 MG/ML IJ SOLN: 100.0000 mg | Freq: Every day | INTRAMUSCULAR | Status: DC

## 2012-07-23 MED ORDER — ZOLPIDEM TARTRATE 5 MG PO TABS
5.0000 mg | ORAL_TABLET | Freq: Every evening | ORAL | Status: DC | PRN
  Administered 2012-07-23: 5 mg via ORAL
  Filled 2012-07-23: qty 1

## 2012-07-23 MED ORDER — LORAZEPAM 2 MG/ML IJ SOLN
0.0000 mg | Freq: Four times a day (QID) | INTRAMUSCULAR | Status: AC
  Administered 2012-07-23: 2 mg via INTRAVENOUS
  Filled 2012-07-23 (×2): qty 1

## 2012-07-23 MED ORDER — POTASSIUM CHLORIDE IN NACL 20-0.9 MEQ/L-% IV SOLN
INTRAVENOUS | Status: DC
  Administered 2012-07-23 – 2012-07-25 (×4): via INTRAVENOUS
  Filled 2012-07-23: qty 1000

## 2012-07-23 MED ORDER — CLONAZEPAM 0.5 MG PO TABS
0.5000 mg | ORAL_TABLET | Freq: Three times a day (TID) | ORAL | Status: DC | PRN
  Administered 2012-07-23: 0.5 mg via ORAL
  Filled 2012-07-23: qty 1

## 2012-07-23 MED ORDER — QUETIAPINE FUMARATE ER 50 MG PO TB24
100.0000 mg | ORAL_TABLET | Freq: Every day | ORAL | Status: DC
  Administered 2012-07-23: 100 mg via ORAL
  Filled 2012-07-23 (×2): qty 2

## 2012-07-23 MED ORDER — CIPROFLOXACIN IN D5W 400 MG/200ML IV SOLN
400.0000 mg | Freq: Two times a day (BID) | INTRAVENOUS | Status: DC
  Administered 2012-07-23 – 2012-07-26 (×7): 400 mg via INTRAVENOUS
  Filled 2012-07-23 (×9): qty 200

## 2012-07-23 MED ORDER — SODIUM CHLORIDE 0.9 % IV SOLN
INTRAVENOUS | Status: AC
  Administered 2012-07-23: 07:00:00 via INTRAVENOUS

## 2012-07-23 MED ORDER — LORAZEPAM 2 MG/ML IJ SOLN: 0.0000 mg | Freq: Two times a day (BID) | INTRAMUSCULAR | Status: DC

## 2012-07-23 MED ORDER — IOHEXOL 300 MG/ML  SOLN
100.0000 mL | Freq: Once | INTRAMUSCULAR | Status: AC | PRN
  Administered 2012-07-23: 100 mL via INTRAVENOUS

## 2012-07-23 MED ORDER — LORAZEPAM 1 MG PO TABS: 1.0000 mg | ORAL_TABLET | Freq: Four times a day (QID) | ORAL | Status: AC | PRN

## 2012-07-23 MED ORDER — CITALOPRAM HYDROBROMIDE 20 MG PO TABS
20.0000 mg | ORAL_TABLET | Freq: Every day | ORAL | Status: DC
  Filled 2012-07-23 (×2): qty 1

## 2012-07-23 NOTE — ED Notes (Signed)
GI Dr consult was placed per Dr.Memon. Pt is in ED holding awaiting an inpatient bed. Dr.Fields is the on call GI Dr. Mervyn Skeeters message was left for her at office and with [facility] in Endo.

## 2012-07-23 NOTE — Consult Note (Signed)
REVIEWED. AGREE. 

## 2012-07-23 NOTE — B[facility] Counselor (Signed)
Spoke with Dr. Dierdre Highman prior to coming on shift, due to having a doctor's appointment. He requested that Mr. Roseboom be seen for an assessment. After arriving at the ED, informed by Dr Lynelle Doctor that the patient was being admitted for gall stones. Spoke with Dr Kerry Hough who is the admitting physician. He has canceled the the consult as the patient is not medically clear,. He will contact  [facility] regarding the consult after the patient is medically clear.

## 2012-07-23 NOTE — Consult Note (Signed)
Referring Provider: Dr Kerry Hough ([facility]) Primary Care Physician:  No primary provider on file. Primary Gastroenterologist:  Dr. Jonette Eva  Reason for Consultation:  Obstructive jaundice  HPI: Edward Trevino is a 79 y.o. male admitted with obstructive jaundice.  He reports pain in chest & epsigastric area that started 2 days ago.  C/o nausea.  Denies vomiting.  Denies fever or chills. Pain 7/10 at worst.  BM normal.  Denies dark urine, clay colored stools or pruritis.  Denies weight loss.   Denies heartburn, indigestion, dysphagia, odynophagia or anorexia.  CT shows Intra and extrahepatic biliary ductal dilatation to the level of the ampulla. Infiltration of the porta hepatis fat. Recommend ERCP. Cholangitis or underlying mass is not excluded. Duodenal diverticulum or less likely contained perforation. Nonspecific circumferential distal esophageal wall thickening, may reflect esophagitis or gastroesophageal reflux disease.  AST/ALT/ALP & bilirubin all significantly elevated.  Lipase normal.    Past Medical History  Diagnosis Date  . Depression   . Insomnia   . Anxiety   . Suicide attempt     Past Surgical History  Procedure Laterality Date  . Appendectomy    . Back surgery      Prior to Admission medications   Medication Sig Start Date End Date Taking? Authorizing Provider  acetaminophen (TYLENOL) 500 MG tablet Take 1,000 mg by mouth daily as needed. For pain    Yes Historical Provider, MD  clonazePAM (KLONOPIN) 0.5 MG tablet Take 0.5 mg by mouth 3 (three) times daily. Takes one tablet twice daily and 2 tablets at bedtime.   Yes Historical Provider, MD  mirtazapine (REMERON) 30 MG tablet Take 30 mg by mouth at bedtime.     Yes Historical Provider, MD  QUEtiapine (SEROQUEL) 100 MG tablet Take 100 mg by mouth 3 (three) times daily.     Yes Historical Provider, MD  venlafaxine XR (EFFEXOR-XR) 75 MG 24 hr capsule Take 75 mg by mouth daily.   Yes Historical Provider, MD    Current  Facility-Administered Medications  Medication Dose Route Frequency Provider Last Rate Last Dose  . 0.9 % NaCl with KCl 20 mEq/ L  infusion   Intravenous Continuous Erick Blinks, MD 75 mL/hr at 07/23/12 0957    . acetaminophen (TYLENOL) tablet 650 mg  650 mg Oral Q6H PRN Erick Blinks, MD       Or  . acetaminophen (TYLENOL) suppository 650 mg  650 mg Rectal Q6H PRN Erick Blinks, MD      . acetaminophen (TYLENOL) tablet 650 mg  650 mg Oral Q4H PRN Shelda Jakes, MD      . ciprofloxacin (CIPRO) IVPB 400 mg  400 mg Intravenous Q12H Erick Blinks, MD   400 mg at 07/23/12 0958  . citalopram (CELEXA) tablet 20 mg  20 mg Oral BH-q7a Erick Blinks, MD   20 mg at 07/23/12 1003  . clonazePAM (KLONOPIN) tablet 0.5 mg  0.5 mg Oral TID PRN Erick Blinks, MD      . enoxaparin (LOVENOX) injection 40 mg  40 mg Subcutaneous Q24H Erick Blinks, MD   40 mg at 07/23/12 0958  . folic acid (FOLVITE) tablet 1 mg  1 mg Oral Daily Erick Blinks, MD   1 mg at 07/23/12 0956  . LORazepam (ATIVAN) injection 0-4 mg  0-4 mg Intravenous Q6H Erick Blinks, MD       Followed by  . [START ON 07/25/2012] LORazepam (ATIVAN) injection 0-4 mg  0-4 mg Intravenous Q12H Erick Blinks, MD      .  LORazepam (ATIVAN) tablet 1 mg  1 mg Oral Q6H PRN Erick Blinks, MD       Or  . LORazepam (ATIVAN) injection 1 mg  1 mg Intravenous Q6H PRN Erick Blinks, MD      . metroNIDAZOLE (FLAGYL) IVPB 500 mg  500 mg Intravenous Q8H Erick Blinks, MD   500 mg at 07/23/12 1112  . mirtazapine (REMERON) tablet 30 mg  30 mg Oral QHS Shelda Jakes, MD   30 mg at 07/23/12 0304  . multivitamin with minerals tablet 1 tablet  1 tablet Oral Daily Erick Blinks, MD   1 tablet at 07/23/12 0956  . ondansetron (ZOFRAN) tablet 4 mg  4 mg Oral Q6H PRN Erick Blinks, MD       Or  . ondansetron (ZOFRAN) injection 4 mg  4 mg Intravenous Q6H PRN Erick Blinks, MD      . QUEtiapine (SEROQUEL) tablet 100 mg  100 mg Oral TID Erick Blinks, MD   100  mg at 07/23/12 1111  . thiamine (VITAMIN B-1) tablet 100 mg  100 mg Oral Daily Erick Blinks, MD   100 mg at 07/23/12 0957   Or  . thiamine (B-1) injection 100 mg  100 mg Intravenous Daily Erick Blinks, MD        Allergies as of 07/22/2012  . (No Known Allergies)    History reviewed. No pertinent family history.  History   Social History  . Marital Status: Single    Spouse Name: N/A    Number of Children: N/A  . Years of Education: N/A   Occupational History  . Not on file.   Social History Main Topics  . Smoking status: Current Every Day Smoker    Types: Cigars  . Smokeless tobacco: Not on file  . Alcohol Use: No     Comment: reported from ED son stated he has been drinking a lot of wine recently  . Drug Use: No  . Sexually Active: No   Other Topics Concern  . Not on file   Social History Narrative  . No narrative on file    Review of Systems: Gen: see HPI CV: Denies palpitations, syncope, orthopnea, PND, peripheral edema, and claudication. Resp: Denies dyspnea at rest, dyspnea with exercise, cough, sputum, wheezing, coughing up blood, and pleurisy. GI: Denies vomiting blood, jaundice, and fecal incontinence.   Denies dysphagia or odynophagia. GU : Denies urinary burning, blood in urine, urinary frequency, urinary hesitancy, nocturnal urination, and urinary incontinence. MS: Denies joint pain, limitation of movement, and swelling, stiffness, low back pain, extremity pain. Denies muscle weakness, cramps, atrophy.  Derm: Denies rash, itching, dry skin, hives, moles, warts, or unhealing ulcers.  Psych: Denies depression, anxiety, memory loss, suicidal ideation, hallucinations, paranoia, and confusion. Heme: Denies bruising, bleeding, and enlarged lymph nodes. Neuro:  Denies any headaches, dizziness, paresthesias. Endo:  Denies any problems with DM, thyroid, adrenal function.  Physical Exam: Vital signs in last 24 hours: Temp:  [97.2 F (36.2 C)-98.9 F (37.2  C)] 97.2 F (36.2 C) (03/10 1521) Pulse Rate:  [67-95] 74 (03/10 1521) Resp:  [13-23] 18 (03/10 1503) BP: (97-134)/(55-94) 133/66 mmHg (03/10 1521) SpO2:  [96 %-100 %] 100 % (03/10 1521) Weight:  [180 lb 3.2 oz (81.738 kg)] 180 lb 3.2 oz (81.738 kg) (03/10 1521)   No LMP for male patient. General:   Alert,  Well-developed, well-nourished, pleasant and cooperative in NAD Head:  Normocephalic and atraumatic. Eyes:  Scleral icterus   Conjunctiva pink. Ears:  Normal auditory acuity. Nose:  No deformity, discharge, or lesions. Mouth:  No deformity or lesions,oropharynx pink & moist. Neck:  Supple; no masses or thyromegaly. Lungs:  Clear throughout to auscultation.   No wheezes, crackles, or rhonchi. No acute distress. Heart:  Regular rate and rhythm; no murmurs, clicks, rubs,  or gallops. Abdomen:  Normal bowel sounds.  No bruits.  Soft,  Mildly distended without masses, hepatosplenomegaly or hernias noted.  + mild epigastric TTP.  No guarding or rebound tenderness.   Rectal:  Deferred. Msk:  Symmetrical without gross deformities. Normal posture. Pulses:  Normal pulses noted. Extremities:  No clubbing or edema. Neurologic:  Alert and oriented x4;  grossly normal neurologically. Skin:  Mild jaudice,.  Intact without significant lesions or rashes. Lymph Nodes:  No significant cervical adenopathy. Psych:  Alert and cooperative. Normal mood and affect.  Intake/Output from previous day:   Intake/Output this shift:    Lab Results:  Recent Labs  07/22/12 1556 07/23/12 1010  WBC 6.4 5.5  HGB 12.9* 13.2  HCT 37.3* 38.7*  PLT 204 206   BMET  Recent Labs  07/22/12 1556 07/23/12 1010  NA 139  --   K 3.8  --   CL 104  --   CO2 25  --   GLUCOSE 118*  --   BUN 12  --   CREATININE 1.12 1.02  CALCIUM 9.0  --    LFT  Recent Labs  07/22/12 1556 07/22/12 1832  PROT 7.1  --   ALBUMIN 3.4*  --   AST 112*  --   ALT 217*  --   ALKPHOS 314*  --   BILITOT 5.2*  --   LIPASE   --  28   Studies/Results: Ct Head Wo Contrast  07/22/2012  *RADIOLOGY REPORT*  Clinical Data: Insomnia.  Anxiety.  CT HEAD WITHOUT CONTRAST  Technique:  Contiguous axial images were obtained from the base of the skull through the vertex without contrast.  Comparison: Head CT scan 01/24/2011.  Findings: There is some cortical atrophy and chronic microvascular ischemic change, stable in appearance.  No evidence of acute intracranial abnormality including infarct, hemorrhage, mass lesion, mass effect, midline shift or abnormal extra-axial fluid collection is identified.  The right maxillary sinus is hypoplastic with mild mucosal thickening identified.  IMPRESSION: No acute finding.  Stable compared to prior exam.   Original Report Authenticated By: Holley Dexter, M.D.    Ct Abdomen Pelvis W Contrast  07/23/2012  *RADIOLOGY REPORT*  Clinical Data: Chest pain, abdominal pain.  CT ABDOMEN AND PELVIS WITH CONTRAST  Technique:  Multidetector CT imaging of the abdomen and pelvis was performed following the standard protocol during bolus administration of intravenous contrast.  Contrast: OMNIPAQUE IOHEXOL 300 MG/ML  SOLN  Comparison: 02/07/2010 radiograph  Findings: Circumferential distal esophageal wall thickening.  And there are a couple calcific densities within the lower lobes, most in keeping with sequelae of prior granulomas infection.  Intra and extrahepatic biliary ductal dilatation.  Infiltrative porta hepatis soft tissue inseparable from the CBD and gallbladder. Duodenal diverticulum versus contained perforation.  Unremarkable spleen and.  Homogeneous pancreatic enhancement. Unremarkable adrenal glands.  Symmetric renal enhancement.  No hydronephrosis or hydroureter.  Colonic diverticulosis.  No CT evidence for colitis or diverticulitis.  Appendix not identified.  No right lower quadrant inflammation.  Small bowel loops are normal course and caliber.  There is scattered atherosclerotic calcification  of the aorta and its branches. No aneurysmal dilatation.  Thin-walled bladder.  Prostatomegaly.  Fat containing left greater than right inguinal hernias.  Osteopenia and multilevel degenerative change.  No acute osseous finding.  IMPRESSION: Intra and extrahepatic biliary ductal dilatation to the level of the ampulla. Infiltration of the porta hepatis fat.  Recommend ERCP. Cholangitis or underlying mass is not excluded.  Duodenal diverticulum or less likely contained perforation.  No free intraperitoneal air or fluid.  Nonspecific circumferential distal esophageal wall thickening, may reflect esophagitis or gastroesophageal reflux disease.   Original Report Authenticated By: Jearld Lesch, M.D.    Dg Chest Portable 1 View  07/22/2012  *RADIOLOGY REPORT*  Clinical Data: Chest pain  PORTABLE CHEST - 1 VIEW  Comparison: CT chest dated 02/07/2010  Findings: Chronic interstitial markings.  Biapical pleural parenchymal scarring, right greater than left.  No focal consolidation.  No pleural effusion or pneumothorax.  The heart is top normal in size.  IMPRESSION: No evidence of acute cardiopulmonary disease.   Original Report Authenticated By: Charline Bills, M.D.     Impression: Edward Trevino is a pleasant 79 y.o. male admitted with 2 days of chest/epigastric pain & obstructive jaundice.   CT shows Intra and extrahepatic biliary ductal dilatation to the level of the ampulla. Infiltration of the porta hepatis fat.  ERCP recommended. Cholangitis or underlying mass is not excluded. Duodenal diverticulum or less likely contained perforation. AST/ALT/ALP & bilirubin all significantly elevated.  No evidence of pancreatitis.  He also has nonspecific circumferential distal esophageal wall thickening which could be due to esophagitis or gastroesophageal reflux disease.  Discussed with Dr Darrick Penna & Dr Karilyn Cota.      Plan: ERCP tomorrow with possible sphincterotomy, stent placement & biopsy with Dr Jena Gauss.  The risks,  benefits, limitations, alternatives, and imponderables have been reviewed with the patient. I specifically discussed a1 in 10 chance of pancreatitis, reaction to medications, bleeding, perforation and the possibility of a failed ERCP. Potential for sphincterotomy and stent placement also reviewed. Questions have been answered. All parties agreeable. Check INR PPI daily Hold Lovenox for procedure Check LFTs in AM Agree with cipro & flagyl  LOS: 1 day   Lorenza Burton  07/23/2012, 3:41 PM [facility]   ;

## 2012-07-23 NOTE — H&P (Signed)
[facility] History and Physical  Edward Trevino YNW:[id number] DOB: [birth year] DOA: 07/22/2012  Referring physician: Dr. Deretha Emory PCP: No primary provider on file.  Specialists:   Chief Complaint: abdominal pain  HPI: Edward Trevino is a 79 y.o. male with a history of depression and suicide attempts in the past, comes to the emergency room today with abdominal pain and chest pain. Apparently for the past 2 or 3 days patient has had developed abdominal pain in his upper abdomen as well as some left-sided chest pain. He denies any associated shortness of breath, vomiting, diarrhea or constipation. The patient is inconsistent with his history. Majority of history is obtained from his son. Patient denies any fever at home. His by mouth intake has been poor for the past 2 days. He was evaluated in the emergency room and was found to have elevated liver function tests. CT of the abdomen pelvis indicates obstructed bile duct. Patient was referred for admission for further treatment.  This and reports that the patient has been increasingly depressed and has been having thoughts of suicide. The patient denies the time of my evaluation, the review of ER notes indicate that on his arrival he did confirm thoughts of suicide. His son reports that he has been hospitalized multiple times for mental health issues. ED physician discussed the case with telepsychiatrist and recommendations were for inpatient psychiatric placement.  Review of Systems: Pertinent positives as per HPI, otherwise negative  Past Medical History  Diagnosis Date  . Depression   . Insomnia   . Anxiety   . Suicide attempt    Past Surgical History  Procedure Laterality Date  . Appendectomy    . Back surgery     Social History:  reports that he has been smoking Cigars.  He does not have any smokeless tobacco history on file. He reports that he does not drink alcohol or use illicit drugs. Lives alone, son lives nearby  No Known  Allergies  Family history: patient did not know his father.  His mother died at the age of 66.  She had a history of depression.  Prior to Admission medications   Medication Sig Start Date End Date Taking? Authorizing Provider  acetaminophen (TYLENOL) 500 MG tablet Take 1,000 mg by mouth daily as needed. For pain     Historical Provider, MD  citalopram (CELEXA) 20 MG tablet Take 20 mg by mouth every morning.      Historical Provider, MD  clonazePAM (KLONOPIN) 0.5 MG tablet Take 1 mg by mouth daily.      Historical Provider, MD  mirtazapine (REMERON) 30 MG tablet Take 30 mg by mouth at bedtime.      Historical Provider, MD  QUEtiapine (SEROQUEL) 100 MG tablet Take 100 mg by mouth 3 (three) times daily.      Historical Provider, MD   Physical Exam: Filed Vitals:   07/22/12 2100 07/22/12 2155 07/22/12 2329 07/23/12 0716  BP: 124/64 124/64 126/67 115/61  Pulse: 74 77 71 92  Temp:   98.9 F (37.2 C) 97.8 F (36.6 C)  TempSrc:   Oral Axillary  Resp: 23 22 18 18   SpO2: 98% 98% 100% 96%     General:  No acute distress, sitting up in bed  Eyes: Pupils are equal round react to light  ENT: Mucous members are moist  Neck: Supple  Cardiovascular: S1, S2, regular rhythm  Respiratory: Clear to auscultation bilaterally  Abdomen: Soft, distended (per son, this is chronic), nontender, bowel sounds  are active  Skin: Appears jaundiced  Musculoskeletal: No cyanosis, edema  Psychiatric: Normal affect, cooperative with exam, denies any suicidal ideations at the time of my exam is consistent with history that was previously provided  Neurologic: Grossly intact, nonfocal  Labs on Admission:  Basic Metabolic Panel:  Recent Labs Lab 07/22/12 1556  NA 139  K 3.8  CL 104  CO2 25  GLUCOSE 118*  BUN 12  CREATININE 1.12  CALCIUM 9.0   Liver Function Tests:  Recent Labs Lab 07/22/12 1556  AST 112*  ALT 217*  ALKPHOS 314*  BILITOT 5.2*  PROT 7.1  ALBUMIN 3.4*    Recent  Labs Lab 07/22/12 1832  LIPASE 28    Recent Labs Lab 07/22/12 1741  AMMONIA 25   CBC:  Recent Labs Lab 07/22/12 1556  WBC 6.4  NEUTROABS 4.3  HGB 12.9*  HCT 37.3*  MCV 96.1  PLT 204   Cardiac Enzymes:  Recent Labs Lab 07/22/12 1556 07/22/12 1832  TROPONINI <0.30 <0.30    BNP (last 3 results) No results found for this basename: PROBNP,  in the last 8760 hours CBG: No results found for this basename: GLUCAP,  in the last 168 hours  Radiological Exams on Admission: Ct Head Wo Contrast  07/22/2012  *RADIOLOGY REPORT*  Clinical Data: Insomnia.  Anxiety.  CT HEAD WITHOUT CONTRAST  Technique:  Contiguous axial images were obtained from the base of the skull through the vertex without contrast.  Comparison: Head CT scan 01/24/2011.  Findings: There is some cortical atrophy and chronic microvascular ischemic change, stable in appearance.  No evidence of acute intracranial abnormality including infarct, hemorrhage, mass lesion, mass effect, midline shift or abnormal extra-axial fluid collection is identified.  The right maxillary sinus is hypoplastic with mild mucosal thickening identified.  IMPRESSION: No acute finding.  Stable compared to prior exam.   Original Report Authenticated By: Holley Dexter, M.D.    Ct Abdomen Pelvis W Contrast  07/23/2012  *RADIOLOGY REPORT*  Clinical Data: Chest pain, abdominal pain.  CT ABDOMEN AND PELVIS WITH CONTRAST  Technique:  Multidetector CT imaging of the abdomen and pelvis was performed following the standard protocol during bolus administration of intravenous contrast.  Contrast: OMNIPAQUE IOHEXOL 300 MG/ML  SOLN  Comparison: 02/07/2010 radiograph  Findings: Circumferential distal esophageal wall thickening.  And there are a couple calcific densities within the lower lobes, most in keeping with sequelae of prior granulomas infection.  Intra and extrahepatic biliary ductal dilatation.  Infiltrative porta hepatis soft tissue inseparable  from the CBD and gallbladder. Duodenal diverticulum versus contained perforation.  Unremarkable spleen and.  Homogeneous pancreatic enhancement. Unremarkable adrenal glands.  Symmetric renal enhancement.  No hydronephrosis or hydroureter.  Colonic diverticulosis.  No CT evidence for colitis or diverticulitis.  Appendix not identified.  No right lower quadrant inflammation.  Small bowel loops are normal course and caliber.  There is scattered atherosclerotic calcification of the aorta and its branches. No aneurysmal dilatation.  Thin-walled bladder.  Prostatomegaly.  Fat containing left greater than right inguinal hernias.  Osteopenia and multilevel degenerative change.  No acute osseous finding.  IMPRESSION: Intra and extrahepatic biliary ductal dilatation to the level of the ampulla. Infiltration of the porta hepatis fat.  Recommend ERCP. Cholangitis or underlying mass is not excluded.  Duodenal diverticulum or less likely contained perforation.  No free intraperitoneal air or fluid.  Nonspecific circumferential distal esophageal wall thickening, may reflect esophagitis or gastroesophageal reflux disease.   Original Report Authenticated By: Greig Castilla  DelGaizo, M.D.    Dg Chest Portable 1 View  07/22/2012  *RADIOLOGY REPORT*  Clinical Data: Chest pain  PORTABLE CHEST - 1 VIEW  Comparison: CT chest dated 02/07/2010  Findings: Chronic interstitial markings.  Biapical pleural parenchymal scarring, right greater than left.  No focal consolidation.  No pleural effusion or pneumothorax.  The heart is top normal in size.  IMPRESSION: No evidence of acute cardiopulmonary disease.   Original Report Authenticated By: Charline Bills, M.D.     EKG: Independently reviewed. No acute ST or T changes  Assessment/Plan Principal Problem:   Obstructive jaundice Active Problems:   Alcohol abuse   Depression   Suicidal ideations   Chest pain   1. Obstructive jaundice. Etiology likely gallstones versus underlying mass.  He will need EGD for further evaluation. He'll be admitted to the hospital in a telemetry bed. We will consult gastroenterology for further evaluation. He'll be kept on clear liquids today and kept n.p.o. after midnight. He is already had breakfast this morning. We will start the patient on ciprofloxacin and Flagyl for antibiotic coverage. Follow serial LFTs. Lipase is normal. 2. Chest pain. Unclear etiology. Cardiac enzymes thus far negative. EKG is unremarkable. Continue to cycle cardiac markers until he rules out for ACS. 3. Depression with suicidal ideations. Patient be maintained on a one-to-one sitter. We will need act team consult once his acute medical issues have resolved. 4. Alcohol abuse. Patient denies ever drinking alcohol. His son reports that he frequently drinks wine and drinks heavily. He will be placed on alcohol withdrawal protocol.    Code Status: Patient requests DNR Family Communication: discussed with son Edward Trevino who is POA over the phone Disposition Plan: will likely need transfer to inpatient psychiatry  Time spent:  MEMON,JEHANZEB [facility] Pager [phone]  If 7PM-7AM, please contact night-coverage [email or web address] Password [email or web address] 07/23/2012, 8:24 AM

## 2012-07-23 NOTE — ED Notes (Signed)
Pt up to use urinal.

## 2012-07-23 NOTE — ED Notes (Signed)
Lunched served. Pt's son here visiting. Info given to son, Dmarco Baldus. Left # [phone]

## 2012-07-24 ENCOUNTER — Encounter (HOSPITAL_COMMUNITY)
Admission: EM | Disposition: A | Discharge status: Other Acute Inpatient Hospital | Payer: Self-pay | Source: Home / Self Care | Attending: Internal Medicine

## 2012-07-24 ENCOUNTER — Inpatient Hospital Stay (HOSPITAL_COMMUNITY): Payer: Medicare Other | Admitting: Anesthesiology

## 2012-07-24 ENCOUNTER — Encounter (HOSPITAL_COMMUNITY): Payer: Self-pay | Admitting: *Deleted

## 2012-07-24 ENCOUNTER — Inpatient Hospital Stay (HOSPITAL_COMMUNITY): Payer: Medicare Other

## 2012-07-24 ENCOUNTER — Encounter (HOSPITAL_COMMUNITY): Payer: Self-pay | Admitting: Anesthesiology

## 2012-07-24 DIAGNOSIS — K8020 Calculus of gallbladder without cholecystitis without obstruction: Secondary | ICD-10-CM

## 2012-07-24 DIAGNOSIS — R45851 Suicidal ideations: Secondary | ICD-10-CM

## 2012-07-24 DIAGNOSIS — K311 Adult hypertrophic pyloric stenosis: Secondary | ICD-10-CM

## 2012-07-24 DIAGNOSIS — K2100 Gastro-esophageal reflux disease with esophagitis, without bleeding: Secondary | ICD-10-CM

## 2012-07-24 HISTORY — PX: BALLOON DILATION: SHX5330

## 2012-07-24 HISTORY — PX: BIOPSY: SHX5522

## 2012-07-24 HISTORY — PX: SPHINCTEROTOMY: SHX5544

## 2012-07-24 HISTORY — PX: ERCP: SHX5425

## 2012-07-24 HISTORY — PX: REMOVAL OF STONES: SHX5545

## 2012-07-24 HISTORY — PX: ESOPHAGOGASTRODUODENOSCOPY: SHX5428

## 2012-07-24 LAB — COMPREHENSIVE METABOLIC PANEL
AST: 94 U/L — ABNORMAL HIGH (ref 0–37)
Alkaline Phosphatase: 312 U/L — ABNORMAL HIGH (ref 39–117)
BUN: 8 mg/dL (ref 6–23)
CO2: 23 mEq/L (ref 19–32)
Calcium: 8.4 mg/dL (ref 8.4–10.5)
Chloride: 109 mEq/L (ref 96–112)
Creatinine, Ser: 0.97 mg/dL (ref 0.50–1.35)
GFR calc non Af Amer: 77 mL/min — ABNORMAL LOW (ref >90)
Total Bilirubin: 5.8 mg/dL — ABNORMAL HIGH (ref 0.3–1.2)

## 2012-07-24 LAB — CBC
HCT: 33.7 % — ABNORMAL LOW (ref 39.0–52.0)
Hemoglobin: 11.5 g/dL — ABNORMAL LOW (ref 13.0–17.0)
MCH: 33 pg (ref 26.0–34.0)
Platelets: 199 10*3/uL (ref 150–400)
RBC: 3.49 MIL/uL — ABNORMAL LOW (ref 4.22–5.81)
WBC: 4.8 10*3/uL (ref 4.0–10.5)

## 2012-07-24 SURGERY — ERCP, WITH INTERVENTION IF INDICATED
Anesthesia: General

## 2012-07-24 MED ORDER — IOHEXOL 350 MG/ML SOLN
INTRAVENOUS | Status: DC | PRN
  Administered 2012-07-24: 13:00:00

## 2012-07-24 MED ORDER — MIDAZOLAM HCL 2 MG/2ML IJ SOLN
1.0000 mg | INTRAMUSCULAR | Status: DC | PRN
  Administered 2012-07-24 (×2): 2 mg via INTRAVENOUS

## 2012-07-24 MED ORDER — GLYCOPYRROLATE 0.2 MG/ML IJ SOLN
INTRAMUSCULAR | Status: DC | PRN
  Administered 2012-07-24: 0.6 mg via INTRAVENOUS

## 2012-07-24 MED ORDER — LIDOCAINE HCL (PF) 1 % IJ SOLN
INTRAMUSCULAR | Status: AC
  Filled 2012-07-24: qty 5

## 2012-07-24 MED ORDER — PROPOFOL 10 MG/ML IV EMUL
INTRAVENOUS | Status: DC | PRN
  Administered 2012-07-24: 150 mg via INTRAVENOUS

## 2012-07-24 MED ORDER — FENTANYL CITRATE 0.05 MG/ML IJ SOLN
INTRAMUSCULAR | Status: AC
  Filled 2012-07-24: qty 2

## 2012-07-24 MED ORDER — ONDANSETRON HCL 4 MG/2ML IJ SOLN
INTRAMUSCULAR | Status: AC
  Filled 2012-07-24: qty 2

## 2012-07-24 MED ORDER — ROCURONIUM BROMIDE 50 MG/5ML IV SOLN
INTRAVENOUS | Status: AC
  Filled 2012-07-24: qty 1

## 2012-07-24 MED ORDER — NEOSTIGMINE METHYLSULFATE 1 MG/ML IJ SOLN
INTRAMUSCULAR | Status: DC | PRN
  Administered 2012-07-24: 4 mg via INTRAVENOUS

## 2012-07-24 MED ORDER — ARTIFICIAL TEARS OP OINT
TOPICAL_OINTMENT | OPHTHALMIC | Status: AC
  Filled 2012-07-24: qty 3.5

## 2012-07-24 MED ORDER — GLYCOPYRROLATE 0.2 MG/ML IJ SOLN
INTRAMUSCULAR | Status: AC
  Filled 2012-07-24: qty 1

## 2012-07-24 MED ORDER — MIDAZOLAM HCL 2 MG/2ML IJ SOLN
INTRAMUSCULAR | Status: AC
  Filled 2012-07-24: qty 2

## 2012-07-24 MED ORDER — ONDANSETRON HCL 4 MG/2ML IJ SOLN
4.0000 mg | Freq: Once | INTRAMUSCULAR | Status: AC
  Administered 2012-07-24: 4 mg via INTRAVENOUS

## 2012-07-24 MED ORDER — FENTANYL CITRATE 0.05 MG/ML IJ SOLN
INTRAMUSCULAR | Status: DC | PRN
  Administered 2012-07-24 (×2): 100 ug via INTRAVENOUS

## 2012-07-24 MED ORDER — LACTATED RINGERS IV SOLN
INTRAVENOUS | Status: DC
  Administered 2012-07-24: 12:00:00 via INTRAVENOUS

## 2012-07-24 MED ORDER — SUCCINYLCHOLINE CHLORIDE 20 MG/ML IJ SOLN
INTRAMUSCULAR | Status: AC
  Filled 2012-07-24: qty 1

## 2012-07-24 MED ORDER — STERILE WATER FOR IRRIGATION IR SOLN
Status: DC | PRN
  Administered 2012-07-24: 13:00:00

## 2012-07-24 MED ORDER — ROCURONIUM BROMIDE 100 MG/10ML IV SOLN
INTRAVENOUS | Status: DC | PRN
  Administered 2012-07-24: 20 mg via INTRAVENOUS

## 2012-07-24 MED ORDER — SUCCINYLCHOLINE CHLORIDE 20 MG/ML IJ SOLN
INTRAMUSCULAR | Status: DC | PRN
  Administered 2012-07-24: 120 mg via INTRAVENOUS

## 2012-07-24 MED ORDER — FENTANYL CITRATE 0.05 MG/ML IJ SOLN: 25.0000 ug | INTRAMUSCULAR | Status: DC | PRN

## 2012-07-24 MED ORDER — PROPOFOL 10 MG/ML IV EMUL
INTRAVENOUS | Status: AC
  Filled 2012-07-24: qty 20

## 2012-07-24 MED ORDER — LACTATED RINGERS IV SOLN
INTRAVENOUS | Status: DC | PRN
  Administered 2012-07-24 (×2): via INTRAVENOUS

## 2012-07-24 MED ORDER — PANTOPRAZOLE SODIUM 40 MG PO TBEC
40.0000 mg | DELAYED_RELEASE_TABLET | Freq: Two times a day (BID) | ORAL | Status: DC
  Administered 2012-07-24 – 2012-07-26 (×5): 40 mg via ORAL
  Filled 2012-07-24 (×5): qty 1

## 2012-07-24 MED ORDER — ONDANSETRON HCL 4 MG/2ML IJ SOLN: 4.0000 mg | Freq: Once | INTRAMUSCULAR | Status: DC | PRN

## 2012-07-24 MED ORDER — GLYCOPYRROLATE 0.2 MG/ML IJ SOLN
0.2000 mg | Freq: Once | INTRAMUSCULAR | Status: AC
  Administered 2012-07-24: 0.2 mg via INTRAVENOUS

## 2012-07-24 SURGICAL SUPPLY — 32 items
BALLN DILATOR CRE WIREGUIDE (BALLOONS) ×2
BALLN RETRIEVAL 12X15 (BALLOONS) ×1 IMPLANT
BALLOON DILATOR CRE WIREGUIDE (BALLOONS) IMPLANT
BALN RTRVL 200 6-7FR 12-15 (BALLOONS) ×1
BASKET TRAPEZOID 3X6 (MISCELLANEOUS) IMPLANT
BASKET TRAPEZOID LITHO 2.5X5 (MISCELLANEOUS) IMPLANT
BLOCK BITE 60FR ADLT L/F BLUE (MISCELLANEOUS) IMPLANT
BRUSH CYTOL RX .035 2.1X8X200 (MISCELLANEOUS) ×1 IMPLANT
BSKT STON RTRVL TRAPEZOID 3X6 (MISCELLANEOUS)
DEVICE INFLATION ENCORE 26 (MISCELLANEOUS) IMPLANT
DEVICE LOCKING W-BIOPSY CAP (MISCELLANEOUS) ×2 IMPLANT
FLOOR PAD 36X40 (MISCELLANEOUS) ×2
FORCEPS BIOP RAD 4 LRG CAP 4 (CUTTING FORCEPS) ×2 IMPLANT
GUIDEWIRE HYDRA JAGWIRE .35 (WIRE) IMPLANT
GUIDEWIRE JAG HINI 025X260CM (WIRE) IMPLANT
NDL HYPO 18GX1.5 BLUNT FILL (NEEDLE) IMPLANT
NEEDLE HYPO 18GX1.5 BLUNT FILL (NEEDLE) ×2 IMPLANT
PAD FLOOR 36X40 (MISCELLANEOUS) IMPLANT
SNARE ROTATE MED OVAL 20MM (MISCELLANEOUS) IMPLANT
SNARE SHORT THROW 27M MED OVAL (MISCELLANEOUS) IMPLANT
SPHINCTEROTOME AUTOTOME .25 (MISCELLANEOUS) IMPLANT
SPHINCTEROTOME HYDRATOME 44 (MISCELLANEOUS) ×4 IMPLANT
SPONGE GAUZE 4X4 12PLY (GAUZE/BANDAGES/DRESSINGS) IMPLANT
SYR 20CC LL (SYRINGE) IMPLANT
SYR 3ML LL SCALE MARK (SYRINGE) ×1 IMPLANT
SYR 50ML LL SCALE MARK (SYRINGE) ×2 IMPLANT
SYR INFLATION 60ML (SYRINGE) ×1 IMPLANT
SYSTEM CONTINUOUS INJECTION (MISCELLANEOUS) ×2 IMPLANT
TUBING ENDO SMARTCAP PENTAX (MISCELLANEOUS) ×1 IMPLANT
WALLSTENT METAL COVERED 10X60 (STENTS) IMPLANT
WALLSTENT METAL COVERED 10X80 (STENTS) IMPLANT
WATER STERILE IRR 1000ML POUR (IV SOLUTION) ×2 IMPLANT

## 2012-07-24 NOTE — Preoperative (Signed)
Beta Blockers   Reason not to administer Beta Blockers:Not Applicable 

## 2012-07-24 NOTE — Op Note (Addendum)
NAME:  LESHAWN, STRAKA NO.:  [id number]  MEDICAL RECORD NO.:  [id number]  LOCATION:  [facility]                          FACILITY:  [facility]  PHYSICIAN:  R. Roetta Sessions, MD FACP FACGDATE OF BIRTH:  1931-05-21  DATE OF PROCEDURE:  07/24/2012 DATE OF DISCHARGE:                              OPERATIVE REPORT   PROCEDURE:  ERCP with biliary sphincterotomy, biliary sphincter balloon dilation, balloon stone extraction, biliary stent placement along with EGD with duodenal biopsy.  INDICATIONS FOR PROCEDURE:  The patient is a 79 year old gentleman with no primary care physician, admitted to the hospital with obstructing jaundice.  CT scan demonstrates extreme intrahepatic biliary dilation without discrete stricture or filling defect concerning for stone disease being seen.  The right upper quadrant is diffusely inflamed. Gallbladder grossly normal.  ERCP is now being done to decompress his biliary tree and investigate the cause of mechanical obstruction further.  Risks, benefits, limitations, alternatives, and imponderables have been discussed.  Questions were answered.  Specifically talked about the risk of pancreatitis, perforation, bleeding, reaction to medications, potential for failed procedure.  The patient's questions were answered.  He is agreeable.  PROCEDURE NOTE:  General endotracheal anesthesia was induced by Dr. Marcos Eke and associates.  The patient was placed in semiprone position.  Instrumentation: Pentax video chip side-viewing duodenum scope and adult gastroscope.  FINDINGS:  Examination of the distal esophagus revealed extensive erosions.  The stomach was empty and appeared to be dilated.  The pylorus was open; however, going into the bulb, there was diffuse friability and edema with significant encroachment on the lumen.  I was able to negotiate the duodenum scope down into the duodenum only after some difficulty ; scope pulled back to the short position,  approximately 55 cm from the incisors.  The ampulla was readily identified on the medial wall of the second portion of duodenum. There was no peri-ampullary duodenal diverticulum seen.  There was no ampullary tumor. Utilizing the Microvasive sphincterotome and guide wire, I palpated/sounded for the bile duct.  With the guidewire, I almost immediately achieved deep biliary cannulation and followed up with the Sphincterotome; a cholangiogram was performed.  The patient had a diffusely dilated biliary tree.  There appeared to be at least a couple of filling defects.  Distally, there was smooth tapering.  Keeping the safety wire deep in the biliary tree, I pulled the sphincterotome back across the ampullary orifice and utilized an Erbe unit at 12 o'clock position.  I performed an 8 to 9 mm sphincterotomy.  This was done without difficulty or apparent complication.  I felt this was the limit  On the cut that I can make given the anatomy.  Subsequently, I railed a CRE balloon across the ampullary orifice and inflated it to 9 mm for 1 minute and then took it down.  Subsequently, I railed a graduated balloon to the confluence and performed a balloon occlusion cholangiogram x4.  Please note, I retrieved over 20, 3 - 5 mm cholesterol appearing gallstones.  Please see multiple photographs.  Stone, debris, and sludge kept coming out of the ampullary orifice.  There did not appear to be a stricture.  Because of  persisting expression of stone material through the ampullary orifice, I elected to go ahead and place a stent.  This was done by railing off the sphincterotome running on a 10-French, 5 cm, between flaps /plastic stent. This was done and appeared to placed in excellent position under fluoroscopic control.  The pancreatic duct was not manipulated.  At this point, the ERCP was terminated and the side-viewing scope was removed and the adult gastroscope was introduced into the esophagus where an EGD  was performed.  The patient had exuberant linear erosions circumferentially extending 2/3rds up the body of the esophagus.  I did not really see anything consistent with Candida esophagitis.  I did not see any obvious Barrett's or tumor.  EG junction was easily traversed.  Stomach insufflated well with air.  Stomach was dilated.  The patient has small hiatal hernia on retroflexion.  No ulcer infiltrating process.  The pylorus itself again was patent, but there was marked inflammatory changes immediately post pyloric channel as described above.  No obvious infiltrating process, but this degree of inflammation /encroachment was suspicious.  I biopsied the abnormal appearing duodenal mucosa and also biopsy of the esophageal mucosa.  The patient tolerated the procedure well and was taken to the PACU in stable condition.  IMPRESSION:  Significant abnormalities of the bulb and proximal second portion producing partial gastric outlet obstruction with secondary gastric dilation and severe erosive reflux esophagitis, status post biopsy of the duodenum and esophagus.  Normal-appearing ampulla, status post biliary sphincterotomy and ampullary balloon dilation, status post stone extraction and stent placement.  No biliary stricture observed.  Status post placement of biliary stent.   Pancreatic duct not manipulated.  I suppose the degree of inflammation seen in the right upper quadrant on CT and the inflammatory changes seen in the duodenum could all be related to smoldering cholangitis related to choledocholithiasis.  I would think less likely there are two separate process i.e., common duct stone disease and infiltrating process involving his proximal duodenum.  RECOMMENDATIONS: 1. Clear liquid diet, we will advance to full liquids, and hold there     for the time being. 2. B.i.d. proton pump inhibitor therapy. 3. Follow up on Pathology. 4. Consider repeat CT scanning in 3 to 4 weeks  versus endoscopic     ultrasound, pending review of Pathology. 5. Biliary stent removal at a later date.  I have discussed my findings at length with Dr. Kerry Hough via telephone this afternoon.  Final interpretation of the films pending with the radiologist.     Jonathon Bellows, MD [facility] Essex     RMR/MEDQ  D:  07/24/2012  T:  07/24/2012  Job:  [id number]

## 2012-07-24 NOTE — Transfer of Care (Signed)
Immediate Anesthesia Transfer of Care Note  Patient: Edward Trevino  Procedure(s) Performed: Procedure(s) with comments: ENDOSCOPIC RETROGRADE CHOLANGIOPANCREATOGRAPHY (ERCP) (N/A) SPHINCTEROTOMY (N/A) BIOPSY (N/A) - Duodenal and Esophageal Biopsies ESOPHAGOGASTRODUODENOSCOPY (EGD) (N/A) BALLOON DILATION (N/A) - Balloon Stone Extraction, Drudging  Patient Location: PACU  Anesthesia Type:General  Level of Consciousness: awake, alert , oriented and patient cooperative  Airway & Oxygen Therapy: Patient Spontanous Breathing and Patient connected to face mask oxygen  Post-op Assessment: Report given to PACU RN and Post -op Vital signs reviewed and stable  Post vital signs: Reviewed and stable  Complications: No apparent anesthesia complications

## 2012-07-24 NOTE — Progress Notes (Signed)
[facility] PROGRESS NOTE  Edward Trevino JYN:[id number] DOB: May 17, 1931 DOA: 07/22/2012 PCP: No primary provider on file.  Assessment/Plan: 1. Obstructive jaundice.  Possibly due to gall stones.  Appreciate GI assistance.  For ERCP today. Continue antibiotics for now 2. Chest pain, atypical, resolved, cardiac enzymes negative 3. Depression with suicidal ideations. Will need inpatient psychiatric placement once medically cleared.  ACT team [facility]) is aware of the patient. 4. Alcohol abuse.  No signs of withdrawal at present, on CIWA protocol.  Code Status: DNR Family Communication: discussed with son Disposition Plan: will need inpatient psychiatric placement when medically cleared   Consultants:  Gastroenterology  Procedures:  ERCP pending  Antibiotics:  cipro 3/10  Flagyl 3/10  HPI/Subjective: Denies any abdominal pain, nausea and vomiting  Objective: Filed Vitals:   07/24/12 0629 07/24/12 1040 07/24/12 1102 07/24/12 1145  BP: 118/66 121/73 121/73 120/73  Pulse: 78 81 81   Temp: 98.4 F (36.9 C) 98.6 F (37 C) 98.3 F (36.8 C)   TempSrc: Oral  Oral   Resp: 20  20 12   Height:   6' 3.25" (1.911 m)   Weight:   81.647 kg (180 lb)   SpO2: 98% 96% 96% 97%    Intake/Output Summary (Last 24 hours) at 07/24/12 1150 Last data filed at 07/24/12 8657  Gross per 24 hour  Intake 2328.75 ml  Output    900 ml  Net 1428.75 ml   Filed Weights   07/23/12 1521 07/24/12 1102  Weight: 81.738 kg (180 lb 3.2 oz) 81.647 kg (180 lb)    Exam:   General:  NAD  Cardiovascular: S1, s2, RRR  Respiratory: CTA B  Abdomen: soft, distended, non tender, bs+  Musculoskeletal: no edema b/l   Data Reviewed: Basic Metabolic Panel:  Recent Labs Lab 07/22/12 1556 07/23/12 1010 07/24/12 0436  NA 139  --  142  K 3.8  --  3.9  CL 104  --  109  CO2 25  --  23  GLUCOSE 118*  --  94  BUN 12  --  8  CREATININE 1.12 1.02 0.97  CALCIUM 9.0  --  8.4   Liver Function  Tests:  Recent Labs Lab 07/22/12 1556 07/24/12 0436  AST 112* 94*  ALT 217* 164*  ALKPHOS 314* 312*  BILITOT 5.2* 5.8*  PROT 7.1 6.0  ALBUMIN 3.4* 2.9*    Recent Labs Lab 07/22/12 1832  LIPASE 28    Recent Labs Lab 07/22/12 1741  AMMONIA 25   CBC:  Recent Labs Lab 07/22/12 1556 07/23/12 1010 07/24/12 0436  WBC 6.4 5.5 4.8  NEUTROABS 4.3  --   --   HGB 12.9* 13.2 11.5*  HCT 37.3* 38.7* 33.7*  MCV 96.1 96.0 96.6  PLT 204 206 199   Cardiac Enzymes:  Recent Labs Lab 07/22/12 1556 07/22/12 1832 07/23/12 1010 07/23/12 1523 07/23/12 2059  TROPONINI <0.30 <0.30 <0.30 <0.30 <0.30   BNP (last 3 results) No results found for this basename: PROBNP,  in the last 8760 hours CBG: No results found for this basename: GLUCAP,  in the last 168 hours  No results found for this or any previous visit (from the past 240 hour(s)).   Studies: Ct Head Wo Contrast  07/22/2012  *RADIOLOGY REPORT*  Clinical Data: Insomnia.  Anxiety.  CT HEAD WITHOUT CONTRAST  Technique:  Contiguous axial images were obtained from the base of the skull through the vertex without contrast.  Comparison: Head CT scan 01/24/2011.  Findings: There is  some cortical atrophy and chronic microvascular ischemic change, stable in appearance.  No evidence of acute intracranial abnormality including infarct, hemorrhage, mass lesion, mass effect, midline shift or abnormal extra-axial fluid collection is identified.  The right maxillary sinus is hypoplastic with mild mucosal thickening identified.  IMPRESSION: No acute finding.  Stable compared to prior exam.   Original Report Authenticated By: Holley Dexter, M.D.    Ct Abdomen Pelvis W Contrast  07/23/2012  *RADIOLOGY REPORT*  Clinical Data: Chest pain, abdominal pain.  CT ABDOMEN AND PELVIS WITH CONTRAST  Technique:  Multidetector CT imaging of the abdomen and pelvis was performed following the standard protocol during bolus administration of intravenous  contrast.  Contrast: OMNIPAQUE IOHEXOL 300 MG/ML  SOLN  Comparison: 02/07/2010 radiograph  Findings: Circumferential distal esophageal wall thickening.  And there are a couple calcific densities within the lower lobes, most in keeping with sequelae of prior granulomas infection.  Intra and extrahepatic biliary ductal dilatation.  Infiltrative porta hepatis soft tissue inseparable from the CBD and gallbladder. Duodenal diverticulum versus contained perforation.  Unremarkable spleen and.  Homogeneous pancreatic enhancement. Unremarkable adrenal glands.  Symmetric renal enhancement.  No hydronephrosis or hydroureter.  Colonic diverticulosis.  No CT evidence for colitis or diverticulitis.  Appendix not identified.  No right lower quadrant inflammation.  Small bowel loops are normal course and caliber.  There is scattered atherosclerotic calcification of the aorta and its branches. No aneurysmal dilatation.  Thin-walled bladder.  Prostatomegaly.  Fat containing left greater than right inguinal hernias.  Osteopenia and multilevel degenerative change.  No acute osseous finding.  IMPRESSION: Intra and extrahepatic biliary ductal dilatation to the level of the ampulla. Infiltration of the porta hepatis fat.  Recommend ERCP. Cholangitis or underlying mass is not excluded.  Duodenal diverticulum or less likely contained perforation.  No free intraperitoneal air or fluid.  Nonspecific circumferential distal esophageal wall thickening, may reflect esophagitis or gastroesophageal reflux disease.   Original Report Authenticated By: Jearld Lesch, M.D.    Dg Chest Portable 1 View  07/22/2012  *RADIOLOGY REPORT*  Clinical Data: Chest pain  PORTABLE CHEST - 1 VIEW  Comparison: CT chest dated 02/07/2010  Findings: Chronic interstitial markings.  Biapical pleural parenchymal scarring, right greater than left.  No focal consolidation.  No pleural effusion or pneumothorax.  The heart is top normal in size.  IMPRESSION: No  evidence of acute cardiopulmonary disease.   Original Report Authenticated By: Charline Bills, M.D.     Scheduled Meds: . [facility] HOLD] ciprofloxacin  400 mg Intravenous Q12H  . [facility] HOLD] citalopram  20 mg Oral BH-q7a  . [facility] HOLD] folic acid  1 mg Oral Daily  . [[facility] HOLD] LORazepam  0-4 mg Intravenous Q6H   Followed by  . [[facility] HOLD] LORazepam  0-4 mg Intravenous Q12H  . [facility] HOLD] metronidazole  500 mg Intravenous Q8H  . [[facility] HOLD] mirtazapine  30 mg Oral QHS  . [[facility] HOLD] multivitamin with minerals  1 tablet Oral Daily  . [[facility] HOLD] pantoprazole (PROTONIX) IV  40 mg Intravenous Q24H  . [facility] HOLD] pneumococcal 23 valent vaccine  0.5 mL Intramuscular Tomorrow-1000  . [facility] HOLD] QUEtiapine  100 mg Oral TID  . [facility] HOLD] thiamine  100 mg Oral Daily   Or  . [facility] HOLD] thiamine  100 mg Intravenous Daily   Continuous Infusions: . sodium chloride    . 0.9 % NaCl with KCl 20 mEq / L 75 mL/hr at 07/24/12 0436  . lactated ringers  75 mL/hr at 07/24/12 1134    Principal Problem:   Obstructive jaundice Active Problems:   Alcohol abuse   Depression   Suicidal ideations   Chest pain    Time spent:    [facility]  [facility] Pager [phone]. If 7PM-7AM, please contact night-coverage at [email or web address], password [facility] 07/24/2012, 11:50 AM  LOS: 2 days

## 2012-07-24 NOTE — Progress Notes (Signed)
Pt. Transferred to pre-op from floor.  Suicide precautions carried out & documented.  I was at bedside with pt. While he was in my care.

## 2012-07-24 NOTE — Anesthesia Preprocedure Evaluation (Signed)
Anesthesia Evaluation  Patient identified by MRN, date of birth, ID band Patient awake    Reviewed: Allergy & Precautions, H&P , NPO status , Patient's Chart, lab work & pertinent test results  History of Anesthesia Complications Negative for: history of anesthetic complications  Airway Mallampati: III TM Distance: >3 FB Neck ROM: Full    Dental  (+) Edentulous Upper and Edentulous Lower   Pulmonary Current Smoker,  breath sounds clear to auscultation        Cardiovascular negative cardio ROS  Rhythm:Regular Rate:Normal     Neuro/Psych PSYCHIATRIC DISORDERS Anxiety Depression    GI/Hepatic GERD-  Medicated and Controlled,  Endo/Other    Renal/GU      Musculoskeletal   Abdominal   Peds  Hematology   Anesthesia Other Findings   Reproductive/Obstetrics                           Anesthesia Physical Anesthesia Plan  ASA: III  Anesthesia Plan: General   Post-op Pain Management:    Induction: Intravenous, Rapid sequence and Cricoid pressure planned  Airway Management Planned: Oral ETT  Additional Equipment:   Intra-op Plan:   Post-operative Plan: Extubation in OR  Informed Consent: I have reviewed the patients History and Physical, chart, labs and discussed the procedure including the risks, benefits and alternatives for the proposed anesthesia with the patient or authorized representative who has indicated his/her understanding and acceptance.     Plan Discussed with:   Anesthesia Plan Comments:         Anesthesia Quick Evaluation

## 2012-07-24 NOTE — Anesthesia Procedure Notes (Signed)
Procedure Name: Intubation Date/Time: 07/24/2012 12:22 PM Performed by: Carolyne Littles, AMY L Pre-anesthesia Checklist: Patient identified, Patient being monitored, Timeout performed, Emergency Drugs available and Suction available Patient Re-evaluated:Patient Re-evaluated prior to inductionOxygen Delivery Method: Circle System Utilized Preoxygenation: Pre-oxygenation with 100% oxygen Intubation Type: IV induction Ventilation: Mask ventilation without difficulty Laryngoscope Size: 3 and Miller Grade View: Grade I Tube type: Oral Tube size: 8.0 mm Number of attempts: 1 Airway Equipment and Method: stylet Placement Confirmation: ETT inserted through vocal cords under direct vision,  positive ETCO2 and breath sounds checked- equal and bilateral Secured at: 21 cm Tube secured with: Tape Dental Injury: Teeth and Oropharynx as per pre-operative assessment

## 2012-07-24 NOTE — Anesthesia Postprocedure Evaluation (Signed)
  Anesthesia Post-op Note  Patient: Edward Trevino  Procedure(s) Performed: Procedure(s) with comments: ENDOSCOPIC RETROGRADE CHOLANGIOPANCREATOGRAPHY (ERCP) (N/A) SPHINCTEROTOMY (N/A) BIOPSY (N/A) - Duodenal and Esophageal Biopsies ESOPHAGOGASTRODUODENOSCOPY (EGD) (N/A) BALLOON DILATION (N/A) - Balloon Stone Extraction, Drudging  Patient Location: PACU  Anesthesia Type:General  Level of Consciousness: awake, alert , oriented and patient cooperative  Airway and Oxygen Therapy: Patient Spontanous Breathing and Patient connected to face mask oxygen  Post-op Pain: none  Post-op Assessment: Post-op Vital signs reviewed, Patient's Cardiovascular Status Stable, Respiratory Function Stable, Patent Airway, No signs of Nausea or vomiting and Pain level controlled  Post-op Vital Signs: Reviewed and stable  Complications: No apparent anesthesia complications

## 2012-07-25 ENCOUNTER — Encounter (HOSPITAL_COMMUNITY): Payer: Self-pay | Admitting: Internal Medicine

## 2012-07-25 ENCOUNTER — Inpatient Hospital Stay (HOSPITAL_COMMUNITY): Payer: Medicare Other

## 2012-07-25 DIAGNOSIS — K208 Other esophagitis without bleeding: Secondary | ICD-10-CM

## 2012-07-25 DIAGNOSIS — K2210 Ulcer of esophagus without bleeding: Secondary | ICD-10-CM | POA: Diagnosis present

## 2012-07-25 DIAGNOSIS — K311 Adult hypertrophic pyloric stenosis: Secondary | ICD-10-CM | POA: Diagnosis present

## 2012-07-25 DIAGNOSIS — K8051 Calculus of bile duct without cholangitis or cholecystitis with obstruction: Principal | ICD-10-CM

## 2012-07-25 DIAGNOSIS — R7401 Elevation of levels of liver transaminase levels: Secondary | ICD-10-CM

## 2012-07-25 HISTORY — DX: Calculus of bile duct without cholangitis or cholecystitis with obstruction: K80.51

## 2012-07-25 LAB — CBC
HCT: 32.1 % — ABNORMAL LOW (ref 39.0–52.0)
Hemoglobin: 11 g/dL — ABNORMAL LOW (ref 13.0–17.0)
MCH: 32.9 pg (ref 26.0–34.0)
MCHC: 34.3 g/dL (ref 30.0–36.0)
MCV: 96.1 fL (ref 78.0–100.0)
Platelets: 205 10*3/uL (ref 150–400)
WBC: 6.1 10*3/uL (ref 4.0–10.5)

## 2012-07-25 LAB — COMPREHENSIVE METABOLIC PANEL
ALT: 127 U/L — ABNORMAL HIGH (ref 0–53)
AST: 63 U/L — ABNORMAL HIGH (ref 0–37)
Albumin: 2.7 g/dL — ABNORMAL LOW (ref 3.5–5.2)
BUN: 9 mg/dL (ref 6–23)
CO2: 25 mEq/L (ref 19–32)
Calcium: 8.5 mg/dL (ref 8.4–10.5)
Creatinine, Ser: 1.02 mg/dL (ref 0.50–1.35)
GFR calc non Af Amer: 68 mL/min — ABNORMAL LOW (ref >90)
Sodium: 139 mEq/L (ref 135–145)
Total Bilirubin: 3.3 mg/dL — ABNORMAL HIGH (ref 0.3–1.2)
Total Protein: 5.6 g/dL — ABNORMAL LOW (ref 6.0–8.3)

## 2012-07-25 LAB — HEPATIC FUNCTION PANEL
ALT: 131 U/L — ABNORMAL HIGH (ref 0–53)
AST: 62 U/L — ABNORMAL HIGH (ref 0–37)
Alkaline Phosphatase: 327 U/L — ABNORMAL HIGH (ref 39–117)
Bilirubin, Direct: 2.1 mg/dL — ABNORMAL HIGH (ref 0.0–0.3)
Indirect Bilirubin: 1.2 mg/dL — ABNORMAL HIGH (ref 0.3–0.9)
Total Bilirubin: 3.3 mg/dL — ABNORMAL HIGH (ref 0.3–1.2)
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
## 2012-07-25 NOTE — Progress Notes (Signed)
Subjective: The patient is lying in bed. He denies chest pain, abdominal pain, nausea, or vomiting. He denies increase in abdominal pain following full liquid diet.  Objective: Vital signs in last 24 hours: Filed Vitals:   07/24/12 1445 07/24/12 1510 07/24/12 2042 07/25/12 0513  BP: 120/69 135/77 132/70 108/60  Pulse: 63 87 76 77  Temp:  97.3 F (36.3 C) 97.2 F (36.2 C) 99.3 F (37.4 C)  TempSrc:  Oral Oral Oral  Resp: 13 18 20 20   Height:      Weight:      SpO2: 100% 98% 100% 94%    Intake/Output Summary (Last 24 hours) at 07/25/12 1158 Last data filed at 07/25/12 9604  Gross per 24 hour  Intake   2840 ml  Output      0 ml  Net   2840 ml    Weight change: -0.09 kg (-3.2 oz)  Physical exam: General: Alert 79 year old Caucasian man laying in bed, in no acute distress. The sitter is in the room. Lungs: Clear to auscultation bilaterally. Heart: S1, S2, with a soft systolic murmur. Abdomen: Positive bowel sounds, soft, nontender, nondistended. Extremities: No pedal edema. Psychiatric: He has a very flat affect. He is alert. His speech is clear. He denies saying that he was "suicidal", but he says that at times he does not want to live and does not have anything to live for. Neurologic: Cranial nerves II through XII are intact.    Lab Results: Basic Metabolic Panel:  Recent Labs  54/09/81 0436 07/25/12 0437  NA 142 139  K 3.9 3.6  CL 109 106  CO2 23 25  GLUCOSE 94 112*  BUN 8 9  CREATININE 0.97 1.02  CALCIUM 8.4 8.5   Liver Function Tests:  Recent Labs  07/24/12 0436 07/25/12 0437  AST 94* 63*  62*  ALT 164* 127*  131*  ALKPHOS 312* 321*  327*  BILITOT 5.8* 3.3*  3.3*  PROT 6.0 5.6*  5.6*  ALBUMIN 2.9* 2.7*  2.7*    Recent Labs  07/22/12 1832  LIPASE 28    Recent Labs  07/22/12 1741  AMMONIA 25   CBC:  Recent Labs  07/22/12 1556  07/24/12 0436 07/25/12 0437  WBC 6.4  < > 4.8 6.1  NEUTROABS 4.3  --   --   --   HGB 12.9*  < >  11.5* 11.0*  HCT 37.3*  < > 33.7* 32.1*  MCV 96.1  < > 96.6 96.1  PLT 204  < > 199 205  < > = values in this interval not displayed. Cardiac Enzymes:  Recent Labs  07/23/12 1010 07/23/12 1523 07/23/12 2059  TROPONINI <0.30 <0.30 <0.30   BNP: No results found for this basename: PROBNP,  in the last 72 hours D-Dimer: No results found for this basename: DDIMER,  in the last 72 hours CBG: No results found for this basename: GLUCAP,  in the last 72 hours Hemoglobin A1C: No results found for this basename: HGBA1C,  in the last 72 hours Fasting Lipid Panel: No results found for this basename: CHOL, HDL, LDLCALC, TRIG, CHOLHDL, LDLDIRECT,  in the last 72 hours Thyroid Function Tests: No results found for this basename: TSH, T4TOTAL, FREET4, T3FREE, THYROIDAB,  in the last 72 hours Anemia Panel: No results found for this basename: VITAMINB12, FOLATE, FERRITIN, TIBC, IRON, RETICCTPCT,  in the last 72 hours Coagulation:  Recent Labs  07/23/12 2059  LABPROT 12.9  INR 0.98   Urine Drug Screen:  Drugs of Abuse     Component Value Date/Time   LABOPIA NONE DETECTED 07/22/2012 1549   COCAINSCRNUR NONE DETECTED 07/22/2012 1549   LABBENZ NONE DETECTED 07/22/2012 1549   AMPHETMU NONE DETECTED 07/22/2012 1549   THCU NONE DETECTED 07/22/2012 1549   LABBARB NONE DETECTED 07/22/2012 1549    Alcohol Level:  Recent Labs  07/22/12 1556  ETH <11   Urinalysis:  Recent Labs  07/22/12 1549  COLORURINE AMBER*  LABSPEC 1.020  PHURINE 6.5  GLUCOSEU NEGATIVE  HGBUR TRACE*  BILIRUBINUR LARGE*  KETONESUR NEGATIVE  PROTEINUR 30*  UROBILINOGEN 0.2  NITRITE NEGATIVE  LEUKOCYTESUR NEGATIVE   Misc. Labs:   Micro: No results found for this or any previous visit (from the past 240 hour(s)).  Studies/Results: Dg Ercp Biliary & Pancreatic Ducts  07/24/2012  *RADIOLOGY REPORT*  Clinical Data: Common bile duct stone, obstructive jaundice  ERCP  Comparison:  CT abdomen pelvis - 07/23/2012  Findings:   Multiple spot fluoroscopic radiographic images during ERCP are provided for review.  Images demonstrate an ERCP probe overlying the right upper abdominal quadrant.  There is selective cannulation of the common bile duct.  Contrast injection demonstrates marked dilatation of the extra and intrahepatic biliary system as demonstrated on preprocedural abdominal CT.  There is mild apparent narrowing of the distal end of the common bile duct regional to the ampulla of Vater.  Subsequent images demonstrate sweeping of the dilated common bile duct with a sphincterotomy balloon.  Completion image demonstrates placement of a plastic indwelling biliary stent within the distal CBD.  A large duodenal diverticulum is suspected.  IMPRESSION: 1.  ERCP with sphincterotomy and stent placement as above. 2.  Moderate intra and extrahepatic biliary ductal dilatation with apparent mild irregular narrowing of the distal aspect of the CBD. Correlation with ERCP results is recommended.  3.  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   Original Report Authenticated By: Tacey Ruiz, MD     Medications:  Scheduled: . ciprofloxacin  400 mg Intravenous Q12H  . citalopram  20 mg Oral BH-q7a  . folic acid  1 mg Oral Daily  . LORazepam  0-4 mg Intravenous Q12H  . metronidazole  500 mg Intravenous Q8H  . mirtazapine  30 mg Oral QHS  . multivitamin with minerals  1 tablet Oral Daily  . pantoprazole  40 mg Oral BID  . QUEtiapine  100 mg Oral TID  . thiamine  100 mg Oral Daily   Or  . thiamine  100 mg Intravenous Daily   Continuous: . 0.9 % NaCl with KCl 20 mEq / L 75 mL/hr at 07/25/12 1610   RUE:[id number], acetaminophen, clonazePAM, LORazepam, LORazepam, ondansetron (ZOFRAN) IV, ondansetron  Assessment: Principal Problem:   Obstructive jaundice Active Problems:   Choledocholithiasis with obstruction   Alcohol abuse   Depression   Suicidal ideations   Chest pain   Erosive esophagitis   Gastric out let  obstruction   1. Obstructive jaundice secondary to choledocholithiasis. Status post ERCP, sphincterotomy/stone extraction, stent placement, and ampullary balloon dilatation, by Dr. Jena Gauss on 07/24/2012. Continue Cipro and Flagyl as ordered. The patient appears to be improving clinically without too much pain. Abdominal ultrasound pending.  Hepatic transaminitis secondary to #1. His LFTs are trending downward.  Gastric outlet obstruction, likely secondary to abnormalities of the duodenum. Biopsies pending. The patient appears to be tolerating a full liquid diet.  Severe erosive esophagitis. Biopsy pending. Continue proton pump inhibitor therapy.  Depression with suicidal ideations. Will consult ACT team as  the patient is medically improved and stable. Continue Celexa, Seroquel and Klonopin.  History of alcohol abuse. No signs of alcohol withdrawal syndrome. Continue vitamin therapy.  Chest pain. May be related to GI symptomatology. Troponin I negative times 4.   Plan:  1. Act team consult. Continue sitter for now. 2. Abdominal ultrasound pending per GI.    LOS: 3 days   FISHER,DENISE 07/25/2012, 11:58 AM

## 2012-07-25 NOTE — Anesthesia Postprocedure Evaluation (Signed)
Anesthesia Post Note  Patient: [name]  Procedure(s) Performed: Procedure(s) (LRB): ENDOSCOPIC RETROGRADE CHOLANGIOPANCREATOGRAPHY (ERCP) (N/A) SPHINCTEROTOMY (N/A) BIOPSY (N/A) ESOPHAGOGASTRODUODENOSCOPY (EGD) (N/A) BALLOON DILATION (N/A) REMOVAL OF STONES (N/A)  Anesthesia type: General  Patient location: 301  Post pain: Pain level controlled  Post assessment: Post-op Vital signs reviewed, Patient's Cardiovascular Status Stable, Respiratory Function Stable, Patent Airway, No signs of Nausea or vomiting and Pain level controlled  Last Vitals:  Filed Vitals:   07/25/12 0513  BP: 108/60  Pulse: 77  Temp: 37.4 C  Resp: 20    Post vital signs: Reviewed and stable  Level of consciousness: awake and alert   Complications: No apparent anesthesia complications

## 2012-07-25 NOTE — Progress Notes (Signed)
Gallbladder ultrasound demonstrates a contracted gallbladder with multiple stones. No evidence of cholecystitis. I have discussed with Dr. Sherrie Mustache. I have recommended a surgical consultation. I have called Dr. Lovell Sheehan. He will see the patient tomorrow morning.

## 2012-07-25 NOTE — Progress Notes (Addendum)
Subjective: Pt denies nausea or vomiting.  Denies abdominal pain.  Objective: Vital signs in last 24 hours: Temp:  [97.2 F (36.2 C)-99.3 F (37.4 C)] 99.3 F (37.4 C) (03/12 0513) Pulse Rate:  [63-87] 77 (03/12 0513) Resp:  [12-20] 20 (03/12 0513) BP: (108-135)/(60-77) 108/60 mmHg (03/12 0513) SpO2:  [94 %-100 %] 94 % (03/12 0513) Weight:  [180 lb (81.647 kg)] 180 lb (81.647 kg) (03/11 1102) Last BM Date: 07/22/12 No LMP for male patient. Body mass index is 22.36 kg/(m^2). General:   Alert, pleasant and cooperative in NAD.  Sitter at bedside. Eyes:  Sclera clear, no icterus.   Conjunctiva pink. Mouth:   oropharynx pink & moist. Heart:  Regular rate and rhythm Abdomen:   Normal bowel sounds.  Soft, nontender and nondistended.  No guarding or rebound tenderness.   Msk:  Symmetrical without gross deformities.  Extremities:  Without edema. Neurologic:  Alert and  oriented x4;  grossly normal neurologically. Skin:  Intact without significant lesions or rashes. Cervical Nodes:  No significant cervical adenopathy. Psych:  Alert and cooperative. Normal mood and affect.  Intake/Output from previous day: 03/11 0701 - 03/12 0700 In: 2840 [P.O.:840; I.V.:1700; IV Piggyback:300] Out: -   Lab Results:  Recent Labs  07/23/12 1010 07/24/12 0436 07/25/12 0437  WBC 5.5 4.8 6.1  HGB 13.2 11.5* 11.0*  HCT 38.7* 33.7* 32.1*  PLT 206 199 205   BMET  Recent Labs  07/22/12 1556 07/23/12 1010 07/24/12 0436 07/25/12 0437  NA 139  --  142 139  K 3.8  --  3.9 3.6  CL 104  --  109 106  CO2 25  --  23 25  GLUCOSE 118*  --  94 112*  BUN 12  --  8 9  CREATININE 1.12 1.02 0.97 1.02  CALCIUM 9.0  --  8.4 8.5   LFT  Recent Labs  07/22/12 1556 07/22/12 1832 07/24/12 0436 07/25/12 0437  PROT 7.1  --  6.0 5.6*  5.6*  ALBUMIN 3.4*  --  2.9* 2.7*  2.7*  AST 112*  --  94* 63*  62*  ALT 217*  --  164* 127*  131*  ALKPHOS 314*  --  312* 321*  327*  BILITOT 5.2*  --  5.8* 3.3*   3.3*  BILIDIR  --   --   --  2.1*  IBILI  --   --   --  1.2*  LIPASE  --  28  --   --    PT/INR  Recent Labs  07/23/12 2059  LABPROT 12.9  INR 0.98   Studies/Results: Dg Ercp Biliary & Pancreatic Ducts  07/24/2012  *RADIOLOGY REPORT*  Clinical Data: Common bile duct stone, obstructive jaundice  ERCP  Comparison:  CT abdomen pelvis - 07/23/2012  Findings:  Multiple spot fluoroscopic radiographic images during ERCP are provided for review.  Images demonstrate an ERCP probe overlying the right upper abdominal quadrant.  There is selective cannulation of the common bile duct.  Contrast injection demonstrates marked dilatation of the extra and intrahepatic biliary system as demonstrated on preprocedural abdominal CT.  There is mild apparent narrowing of the distal end of the common bile duct regional to the ampulla of Vater.  Subsequent images demonstrate sweeping of the dilated common bile duct with a sphincterotomy balloon.  Completion image demonstrates placement of a plastic indwelling biliary stent within the distal CBD.  A large duodenal diverticulum is suspected.  IMPRESSION: 1.  ERCP with sphincterotomy and stent  placement as above. 2.  Moderate intra and extrahepatic biliary ductal dilatation with apparent mild irregular narrowing of the distal aspect of the CBD. Correlation with ERCP results is recommended.  3.  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   Original Report Authenticated By: Tacey Ruiz, MD     Assessment: 1. Obstructive jaundice:  Choledocholithiasis, s/p ERCP , sphincterotomy & ampullary balloon dilation, stone extraction and stent placement.   2. GOO:   Secondary to abnormalities of bulb & D2.  Bx pending 3. Severe  Erosive esophagitis:  Bx pending  On PPI.   Plan: 1. Abdominal ultrasound 2. Add SCDs 3. Continue supportive measures 4. Continue PPI  LOS: 3 days   Edward Trevino  07/25/2012, 8:07 AM   Reviewed cholangiogram and CT scan with the radiologist today. No  convincing evidence for duodenal diverticulum on either cholangiogram or the recent CT scan. He may well have occult cholelithiasis/cholecystitis. Transabdominal ultrasound will help sort that out-will be done a little later today.  With complete lithiasis alone, he may benefit from surgical consultation while he is here. I have reviewed results of workup thus far with patient and son at length at the bedside today.

## 2012-07-26 ENCOUNTER — Encounter (HOSPITAL_COMMUNITY): Payer: Self-pay | Admitting: Internal Medicine

## 2012-07-26 DIAGNOSIS — K838 Other specified diseases of biliary tract: Secondary | ICD-10-CM

## 2012-07-26 DIAGNOSIS — K311 Adult hypertrophic pyloric stenosis: Secondary | ICD-10-CM

## 2012-07-26 LAB — COMPREHENSIVE METABOLIC PANEL
AST: 132 U/L — ABNORMAL HIGH (ref 0–37)
Albumin: 3.1 g/dL — ABNORMAL LOW (ref 3.5–5.2)
BUN: 8 mg/dL (ref 6–23)
CO2: 27 mEq/L (ref 19–32)
Chloride: 105 mEq/L (ref 96–112)
Creatinine, Ser: 1.09 mg/dL (ref 0.50–1.35)
GFR calc Af Amer: 73 mL/min — ABNORMAL LOW (ref >90)
Glucose, Bld: 134 mg/dL — ABNORMAL HIGH (ref 70–99)
Total Protein: 6.4 g/dL (ref 6.0–8.3)

## 2012-07-26 LAB — H. PYLORI ANTIBODY, IGG: H Pylori IgG: 0.66 {ISR}

## 2012-07-26 MED ORDER — PANTOPRAZOLE SODIUM 40 MG PO TBEC: 40.0000 mg | DELAYED_RELEASE_TABLET | Freq: Two times a day (BID) | ORAL | Status: DC

## 2012-07-26 MED ORDER — VENLAFAXINE HCL ER 75 MG PO CP24: 75.0000 mg | ORAL_CAPSULE | Freq: Every day | ORAL | Status: DC

## 2012-07-26 MED ORDER — MIRTAZAPINE 30 MG PO TABS: 30.0000 mg | ORAL_TABLET | Freq: Every day | ORAL | Status: DC

## 2012-07-26 MED ORDER — METRONIDAZOLE 500 MG PO TABS: 500.0000 mg | ORAL_TABLET | Freq: Three times a day (TID) | ORAL | Status: DC

## 2012-07-26 MED ORDER — CIPROFLOXACIN HCL 250 MG PO TABS: 500.0000 mg | ORAL_TABLET | Freq: Two times a day (BID) | ORAL | Status: DC

## 2012-07-26 MED ORDER — ADULT MULTIVITAMIN W/MINERALS CH: 1.0000 | ORAL_TABLET | Freq: Every day | ORAL | Status: DC

## 2012-07-26 MED ORDER — CLONAZEPAM 0.5 MG PO TABS: 0.5000 mg | ORAL_TABLET | Freq: Three times a day (TID) | ORAL | Status: DC

## 2012-07-26 MED ORDER — METRONIDAZOLE 500 MG PO TABS
500.0000 mg | ORAL_TABLET | Freq: Three times a day (TID) | ORAL | Status: DC
  Administered 2012-07-26: 500 mg via ORAL
  Filled 2012-07-26: qty 1

## 2012-07-26 MED ORDER — QUETIAPINE FUMARATE 100 MG PO TABS: 100.0000 mg | ORAL_TABLET | Freq: Three times a day (TID) | ORAL | Status: DC

## 2012-07-26 MED ORDER — CIPROFLOXACIN HCL 500 MG PO TABS: 500.0000 mg | ORAL_TABLET | Freq: Two times a day (BID) | ORAL | Status: DC

## 2012-07-26 NOTE — Progress Notes (Signed)
Pt continues to be despondent. Accepted for admission to [facility]. Pt status changed to involuntary per recommendation of telepsych.

## 2012-07-26 NOTE — Progress Notes (Signed)
UR Chart Review Completed  

## 2012-07-26 NOTE — Progress Notes (Addendum)
Subjective: Pleasant, states he would rather not be examined as "the nurse and doctor already did this earlier". Denies abdominal pain, N/V. States he misses his wife who passed away. He doesn't remember when and appears slightly confused but aware of why he is here.   Objective: Vital signs in last 24 hours: Temp:  [98.2 F (36.8 C)-98.4 F (36.9 C)] 98.2 F (36.8 C) (03/12 2012) Pulse Rate:  [65-73] 65 (03/12 2012) Resp:  [20] 20 (03/12 2012) BP: (120-130)/(58-71) 120/58 mmHg (03/12 2012) SpO2:  [95 %-100 %] 100 % (03/12 2012) Last BM Date: 07/22/12 Patient refused physical exam.  Intake/Output from previous day: 03/12 0701 - 03/13 0700 In: 1200 [P.O.:1200] Out: -  Intake/Output this shift:    Lab Results:  Recent Labs  07/23/12 1010 07/24/12 0436 07/25/12 0437  WBC 5.5 4.8 6.1  HGB 13.2 11.5* 11.0*  HCT 38.7* 33.7* 32.1*  PLT 206 199 205   BMET  Recent Labs  07/23/12 1010 07/24/12 0436 07/25/12 0437  NA  --  142 139  K  --  3.9 3.6  CL  --  109 106  CO2  --  23 25  GLUCOSE  --  94 112*  BUN  --  8 9  CREATININE 1.02 0.97 1.02  CALCIUM  --  8.4 8.5   LFT  Recent Labs  07/24/12 0436 07/25/12 0437  PROT 6.0 5.6*  5.6*  ALBUMIN 2.9* 2.7*  2.7*  AST 94* 63*  62*  ALT 164* 127*  131*  ALKPHOS 312* 321*  327*  BILITOT 5.8* 3.3*  3.3*  BILIDIR  --  2.1*  IBILI  --  1.2*   PT/INR  Recent Labs  07/23/12 2059  LABPROT 12.9  INR 0.98     Studies/Results: US Abdomen Complete  07/25/2012  *RADIOLOGY REPORT*  Clinical Data:  Common bile duct stones.  COMPLETE ABDOMINAL ULTRASOUND  Comparison:  CT scan of the abdomen dated 07/23/2012 and ERCP dated 07/24/2012  Findings:  Gallbladder: There are multiple small stones in the contracted gallbladder.  Common bile duct:  The common bile duct is slightly prominent at 6.9 mm.  A stent is visible in the common bile duct.  No visible common bile duct stones.               Liver:  Normal.  IVC:  Normal.   Pancreas:  Normal.  Spleen:  Normal.  9.0 cm in length.  Right Kidney:  Normal.  10.1 cm in length.  Left Kidney:  Normal.  10.6 cm in length  Abdominal aorta:  No aneurysm identified.  Maximal diameter 2.2 cm.  IMPRESSION: Multiple stones in the contracted gallbladder.  No visible common bile duct stones.  Slight residual prominence of the biliary tree. Common bile duct stent in place.   Original Report Authenticated By: Francene Boyers, M.D.    Dg Ercp Biliary & Pancreatic Ducts  07/24/2012  *RADIOLOGY REPORT*  Clinical Data: Common bile duct stone, obstructive jaundice  ERCP  Comparison:  CT abdomen pelvis - 07/23/2012  Findings:  Multiple spot fluoroscopic radiographic images during ERCP are provided for review.  Images demonstrate an ERCP probe overlying the right upper abdominal quadrant.  There is selective cannulation of the common bile duct.  Contrast injection demonstrates marked dilatation of the extra and intrahepatic biliary system as demonstrated on preprocedural abdominal CT.  There is mild apparent narrowing of the distal end of the common bile duct regional to the ampulla of Vater.  Subsequent  images demonstrate sweeping of the dilated common bile duct with a sphincterotomy balloon.  Completion image demonstrates placement of a plastic indwelling biliary stent within the distal CBD.  A large duodenal diverticulum is suspected.  IMPRESSION: 1.  ERCP with sphincterotomy and stent placement as above. 2.  Moderate intra and extrahepatic biliary ductal dilatation with apparent mild irregular narrowing of the distal aspect of the CBD. Correlation with ERCP results is recommended.  3.  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   Original Report Authenticated By: Tacey Ruiz, MD     Assessment: 79 year old male with obstructive jaundice s/p ERCP on 3/11 with stone extraction, stent placement, sphincterotomy, ampullary balloon dilation. Also noted severe erosive esophagitis, abnormalities of bulb and  proximal second portion of duodenum resulting in partial gastric outlet obstruction. Biopsies and H.pylori serology pending. US abdomen noted contracted gallbladder with multiple stones. Surgery (Dr. Lovell Sheehan) has been consulted for consideration of cholecystectomy. No need for cholecystectomy during this admission, rather recommendations to evaluate after biopsy results obtained. May need future evaluation to include possible CT versus EUS in several weeks after review of final pathology.    Plan: Follow-up biopsy and H.pylori serology BID PPI CMP today, ordered Hopeful advance of diet after review of CMP   LOS: 4 days    07/26/2012, 9:30 AM  Bilirubin down to 2.4 today. Dr. Lovell Sheehan consultation reviewed and appreciated. Will advance to a heart healthy diet. We'll plan outpatient EGD in about a month assuming recent duodenal biopsies negative for malignancy. I would then anticipate subsequent stent removal and cholecystectomy.  Discussed with Dr. Sherrie Mustache.

## 2012-07-26 NOTE — Consult Note (Signed)
Reason for Consult: Cholelithiasis, choledocholithiasis, duodenitis Referring Physician: Dr. Amedeo Trevino is an 79 y.o. male.  HPI: Patient is a 79 year old white male who recently underwent an ERCP for jaundice. He was noted to have multiple gallstones within the common bile duct. They could not all be removed. The patient was stented. Ultrasound the gallbladder reveals a retracted gallbladder with stones. Clinically, the patient feels fine and denies any abdominal pain. The patient is somewhat a poor historian.  Past Medical History  Diagnosis Date  . Depression   . Insomnia   . Anxiety   . Suicide attempt   . Erosive esophagitis 07/25/2012  . Gastric out let obstruction 07/25/2012    Past Surgical History  Procedure Laterality Date  . Appendectomy    . Back surgery      History reviewed. No pertinent family history.  Social History:  reports that he has been smoking Cigars.  He does not have any smokeless tobacco history on file. He reports that he does not drink alcohol or use illicit drugs.  Allergies: No Known Allergies  Medications: I have reviewed the patient's current medications.  Results for orders placed during the hospital encounter of 07/22/12 (from the past 48 hour(s))  COMPREHENSIVE METABOLIC PANEL     Status: Abnormal   Collection Time    07/25/12  4:37 AM      Result Value Range   Sodium 139  135 - 145 mEq/L   Potassium 3.6  3.5 - 5.1 mEq/L   Chloride 106  96 - 112 mEq/L   CO2 25  19 - 32 mEq/L   Glucose, Bld 112 (*) 70 - 99 mg/dL   BUN 9  6 - 23 mg/dL   Creatinine, Ser 0.86  0.50 - 1.35 mg/dL   Calcium 8.5  8.4 - 57.8 mg/dL   Total Protein 5.6 (*) 6.0 - 8.3 g/dL   Albumin 2.7 (*) 3.5 - 5.2 g/dL   AST 63 (*) 0 - 37 U/L   ALT 127 (*) 0 - 53 U/L   Alkaline Phosphatase 321 (*) 39 - 117 U/L   Total Bilirubin 3.3 (*) 0.3 - 1.2 mg/dL   GFR calc non Af Amer 68 (*) >90 mL/min   GFR calc Af Amer 79 (*) >90 mL/min   Comment:            The eGFR has  been calculated     using the CKD EPI equation.     This calculation has not been     validated in all clinical     situations.     eGFR's persistently     <90 mL/min signify     possible Chronic Kidney Disease.  CBC     Status: Abnormal   Collection Time    07/25/12  4:37 AM      Result Value Range   WBC 6.1  4.0 - 10.5 K/uL   RBC 3.34 (*) 4.22 - 5.81 MIL/uL   Hemoglobin 11.0 (*) 13.0 - 17.0 g/dL   HCT 46.9 (*) 62.9 - 52.8 %   MCV 96.1  78.0 - 100.0 fL   MCH 32.9  26.0 - 34.0 pg   MCHC 34.3  30.0 - 36.0 g/dL   RDW 41.3  24.4 - 01.0 %   Platelets 205  150 - 400 K/uL  HEPATIC FUNCTION PANEL     Status: Abnormal   Collection Time    07/25/12  4:37 AM      Result  Value Range   Total Protein 5.6 (*) 6.0 - 8.3 g/dL   Albumin 2.7 (*) 3.5 - 5.2 g/dL   AST 62 (*) 0 - 37 U/L   ALT 131 (*) 0 - 53 U/L   Alkaline Phosphatase 327 (*) 39 - 117 U/L   Total Bilirubin 3.3 (*) 0.3 - 1.2 mg/dL   Bilirubin, Direct 2.1 (*) 0.0 - 0.3 mg/dL   Indirect Bilirubin 1.2 (*) 0.3 - 0.9 mg/dL    US Abdomen Complete  07/25/2012  *RADIOLOGY REPORT*  Clinical Data:  Common bile duct stones.  COMPLETE ABDOMINAL ULTRASOUND  Comparison:  CT scan of the abdomen dated 07/23/2012 and ERCP dated 07/24/2012  Findings:  Gallbladder: There are multiple small stones in the contracted gallbladder.  Common bile duct:  The common bile duct is slightly prominent at 6.9 mm.  A stent is visible in the common bile duct.  No visible common bile duct stones.               Liver:  Normal.  IVC:  Normal.  Pancreas:  Normal.  Spleen:  Normal.  9.0 cm in length.  Right Kidney:  Normal.  10.1 cm in length.  Left Kidney:  Normal.  10.6 cm in length  Abdominal aorta:  No aneurysm identified.  Maximal diameter 2.2 cm.  IMPRESSION: Multiple stones in the contracted gallbladder.  No visible common bile duct stones.  Slight residual prominence of the biliary tree. Common bile duct stent in place.   Original Report Authenticated By: Edward Trevino, M.D.    Dg Ercp Biliary & Pancreatic Ducts  07/24/2012  *RADIOLOGY REPORT*  Clinical Data: Common bile duct stone, obstructive jaundice  ERCP  Comparison:  CT abdomen pelvis - 07/23/2012  Findings:  Multiple spot fluoroscopic radiographic images during ERCP are provided for review.  Images demonstrate an ERCP probe overlying the right upper abdominal quadrant.  There is selective cannulation of the common bile duct.  Contrast injection demonstrates marked dilatation of the extra and intrahepatic biliary system as demonstrated on preprocedural abdominal CT.  There is mild apparent narrowing of the distal end of the common bile duct regional to the ampulla of Vater.  Subsequent images demonstrate sweeping of the dilated common bile duct with a sphincterotomy balloon.  Completion image demonstrates placement of a plastic indwelling biliary stent within the distal CBD.  A large duodenal diverticulum is suspected.  IMPRESSION: 1.  ERCP with sphincterotomy and stent placement as above. 2.  Moderate intra and extrahepatic biliary ductal dilatation with apparent mild irregular narrowing of the distal aspect of the CBD. Correlation with ERCP results is recommended.  3.  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   Original Report Authenticated By: Edward Ruiz, MD     ROS: See chart Blood pressure 120/58, pulse 65, temperature 98.2 F (36.8 C), temperature source Oral, resp. rate 20, height 6' 3.25" (1.911 m), weight 81.647 kg (180 lb), SpO2 100.00%. Physical Exam: Pleasant white male no acute distress. Abdomen is soft, nontender, nondistended. No rigidity noted.  Assessment/Plan: Impression: Cholelithiasis, choledocholithiasis, esophagitis, duodenitis. Biopsies of the duodenum are still pending. Plan: I have discussed the case with Dr. Jena Trevino. No need for urgent cholecystectomy at this time. Pending the biopsy results, a followup EGD may be indicated. In addition, may need repeat ERCP to evaluate the  common bile duct for additional stones prior to laparoscopic cholecystectomy. We'll continue to follow with you peripherally.  Edward Trevino A 07/26/2012, 8:46 AM

## 2012-07-26 NOTE — Discharge Summary (Signed)
Physician Discharge Summary  Edward Trevino ZOX:[id number] DOB: November 12, 1931 DOA: 07/22/2012  PCP: Louie Boston, MD  Admit date: 07/22/2012 Discharge date: 07/26/2012  Time spent: Greater than 30 minutes  Recommendations for Outpatient Follow-up:  1. Transfer to inpatient psychiatric facility when bed is available. 2. Gastrointestinal biopsies pending. The results will need to be followed up upon. 3. The patient will followup with the gastroenterology  team (Dr. Jena Gauss or Dr. Darrick Penna) in the next few weeks for a followup EGD and removal of the biliary stent. He will then be referred to general surgery for an elective cholecystectomy.  Discharge Diagnoses:  1. Obstructive jaundice secondary to choledocholithiasis with obstruction. Status post ERCP, sphincterotomy/stone extraction, stent placement, and ampullary balloon dilatation by Dr. Jena Gauss on 07/24/2012. 2. Partial gastric outlet obstruction per EGD by Dr. Benard Rink on 07/24/2012. 3. Abnormal appearing duodenum, per EGD. Biopsy results pending. 4. Severe erosive esophagitis per EGD.  5. Chest pain secondary to GI symptomatology. Myocardial infarction ruled out. 6. Hepatic transaminitis secondary to #1. 7. Mild dilutional anemia. 8. History of alcohol abuse. No withdrawal syndrome evident during the hospitalization. 9. Depression with suicidal ideation and history of attempted suicide with gun in the past.   Discharge Condition: Improved and medically cleared.  Diet recommendation: low-fat/heart healthy.   Filed Weights   07/23/12 1521 07/24/12 1102  Weight: 81.738 kg (180 lb 3.2 oz) 81.647 kg (180 lb)    History of present illness:  The patient is a 79 year old man with a history of depression and previous suicidal attempts,  Who presented to the emergency department on 07/22/2012 with a chief complaint of chest pain and abdominal pain. He also endorsed suicidal thoughts. Tele-psychiatry consult was performed in the ED. The psychiatrist  recommended inpatient psychiatric treatment pending medical stability. For further evaluation of the patient's abdominal and chest pain, additional studies were ordered. His troponin I was negative. His EKG revealed no acute ST or T wave abnormalities. His liver transaminases were elevated including his bilirubin which was elevated at 5.2. CT scan of his abdomen and pelvis revealed extrahepatic biliary ductal dilatation, infiltration of the porta hepatis fat, probable duodenal diverticulum, and nonspecific circumferential distal esophageal wall thickening. He was admitted for further evaluation and management.   Hospital Course:    Obstructive jaundice secondary to choledocholithiasis. The patient was started on IV fluid hydration and analgesics as needed for pain. He was made to be virtually n.p.o. IV Cipro and Flagyl was started empirically for possible cholangitis. Gastroenterology was consulted. Dr. Darrick Penna and colleague provided the initial evaluation and recommendations. Following their evaluation, the patient was started on proton pump inhibitor therapy with Protonix. Further diagnostic workup was recommended with an ERCP with possible sphincterotomy stent placement and biopsy. Gastroenterologist Dr. Jena Gauss followed up. Subsequently, he performed an ERCP, sphincterotomy/stone extraction, stent placement, and ampullary balloon dilatation on 07/24/2012 successfully.  Dr. Jena Gauss noted an abnormal appearing duodenum and obtained biopsies. He did not see a diverticulum. He reviewed the cholangiogram and CT with the radiologist and per their assessment, there was no convincing evidence of a duodenum diverticulum on either study. Eventually, the patient's pain subsided and his diet was progressively advanced. With the advancement he had no abdominal pain, nausea, or vomiting. An ultrasound of the abdomen was ordered following the ERCP. Not surprisingly, it revealed multiple small gallstones. The common bile duct  stent was in place. General surgeon Dr. Lovell Sheehan was consulted. Per Dr. Lovell Sheehan assessment, there was no urgent need for cholecystectomy at this time. He  stated that the patient may need a repeat ERCP to evaluate the common bile duct for additional stones prior to a laparoscopic cholecystectomy. The patient's liver transaminases waxed and waned but overall improved. The patient became abdominal pain free. The future plan is for the patient to undergo an outpatient EGD in a few weeks assuming the recent duodenal biopsies are negative for malignancy and to have the stent removed and subsequent cholecystectomy thereafter. The duodenal biopsy results are pending at the time of this discharge summary.  Hepatic transaminitis was secondary to #1. Overall, the patient's LFTs waxed and waned but improved. His bilirubin was 2.4 at the time of this dictation.   Gastric outlet obstruction, per EGD. This was likely secondary to abnormalities of the duodenum. Biopsies obtained and the pathology results are pending. With advancement of his diet, clinically, there was no evidence of gastric outlet obstruction.  Severe erosive esophagitis, per EGD. The patient is being maintained on twice a day Protonix. Biopsies obtained and pathology results are pending.   Depression with suicidal ideations. The patient was initially seen by the  Tele-psychiatrist who recommended inpatient psychiatric treatment. A 24-hour sitter was ordered. The ACT team was re\re consulted. The plan is to discharge him to an inpatient psychiatric facility when a bed is available. He was maintained on Seroquel and Klonopin. Celexa was prescribed, but he had been treated with Effexor in the past. Therefore, Celexa was discontinued in favor of starting Effexor. The patient has been medically cleared for discharge.   History of alcohol abuse. Apparently, the patient had started drinking heavily recently per family account. He was started on vitamin therapy  and the Ativan alcohol withdrawal protocol. He demonstrated no signs or symptoms consistent with alcohol withdrawal syndrome.   Chest pain. This was thought to be secondary to the GI abnormalities noted above. His Troponin I was negative times 4. His chest pain resolved.     Procedures:  EGD/ERCP, etc. per Dr. Jena Gauss on 07/24/2012   Consultations:  Gastroenterologist, Dr. Darrick Penna and Dr. Jena Gauss  General surgeon, Dr. Franky Macho   Discharge Exam: Filed Vitals:   07/25/12 0513 07/25/12 1440 07/25/12 2012 07/26/12 1423  BP: 108/60 130/71 120/58 117/56  Pulse: 77 73 65 80  Temp: 99.3 F (37.4 C) 98.4 F (36.9 C) 98.2 F (36.8 C) 97.6 F (36.4 C)  TempSrc: Oral Oral Oral Oral  Resp: 20 20 20 20   Height:      Weight:      SpO2: 94% 95% 100% 100%    General: No acute distress.  Cardiovascular: S1, S2, with no murmurs rubs or gallops.  Respiratory:Clear to auscultation bilaterally.   abdomen: Positive bowel sounds, soft, nontender, nondistended. Psychiatric: Flat affect. Speech clear. Acknowledges suicidal thoughts but denies suicidal ideation currently.  Discharge Instructions      Discharge Orders   Future Orders Complete By Expires     Diet - low sodium heart healthy  As directed     Increase activity slowly  As directed         Medication List    TAKE these medications       acetaminophen 500 MG tablet  Commonly known as:  TYLENOL  Take 1,000 mg by mouth daily as needed. For pain     ciprofloxacin 500 MG tablet  Commonly known as:  CIPRO  Take 1 tablet (500 mg total) by mouth 2 (two) times daily.     clonazePAM 0.5 MG tablet  Commonly known as:  Scarlette Calico  Take 1 tablet (0.5 mg total) by mouth 3 (three) times daily. Takes one tablet twice daily and 2 tablets at bedtime.     metroNIDAZOLE 500 MG tablet  Commonly known as:  FLAGYL  Take 1 tablet (500 mg total) by mouth every 8 (eight) hours.     mirtazapine 30 MG tablet  Commonly known as:  REMERON  Take  1 tablet (30 mg total) by mouth at bedtime.     multivitamin with minerals Tabs  Take 1 tablet by mouth daily.     pantoprazole 40 MG tablet  Commonly known as:  PROTONIX  Take 1 tablet (40 mg total) by mouth 2 (two) times daily.     QUEtiapine 100 MG tablet  Commonly known as:  SEROQUEL  Take 1 tablet (100 mg total) by mouth 3 (three) times daily.     venlafaxine XR 75 MG 24 hr capsule  Commonly known as:  EFFEXOR-XR  Take 1 capsule (75 mg total) by mouth daily.          The results of significant diagnostics from this hospitalization (including imaging, microbiology, ancillary and laboratory) are listed below for reference.    Significant Diagnostic Studies: Ct Head Wo Contrast  07/22/2012  *RADIOLOGY REPORT*  Clinical Data: Insomnia.  Anxiety.  CT HEAD WITHOUT CONTRAST  Technique:  Contiguous axial images were obtained from the base of the skull through the vertex without contrast.  Comparison: Head CT scan 01/24/2011.  Findings: There is some cortical atrophy and chronic microvascular ischemic change, stable in appearance.  No evidence of acute intracranial abnormality including infarct, hemorrhage, mass lesion, mass effect, midline shift or abnormal extra-axial fluid collection is identified.  The right maxillary sinus is hypoplastic with mild mucosal thickening identified.  IMPRESSION: No acute finding.  Stable compared to prior exam.   Original Report Authenticated By: Holley Dexter, M.D.    US Abdomen Complete  07/25/2012  *RADIOLOGY REPORT*  Clinical Data:  Common bile duct stones.  COMPLETE ABDOMINAL ULTRASOUND  Comparison:  CT scan of the abdomen dated 07/23/2012 and ERCP dated 07/24/2012  Findings:  Gallbladder: There are multiple small stones in the contracted gallbladder.  Common bile duct:  The common bile duct is slightly prominent at 6.9 mm.  A stent is visible in the common bile duct.  No visible common bile duct stones.               Liver:  Normal.  IVC:  Normal.   Pancreas:  Normal.  Spleen:  Normal.  9.0 cm in length.  Right Kidney:  Normal.  10.1 cm in length.  Left Kidney:  Normal.  10.6 cm in length  Abdominal aorta:  No aneurysm identified.  Maximal diameter 2.2 cm.  IMPRESSION: Multiple stones in the contracted gallbladder.  No visible common bile duct stones.  Slight residual prominence of the biliary tree. Common bile duct stent in place.   Original Report Authenticated By: Francene Boyers, M.D.    Ct Abdomen Pelvis W Contrast  07/23/2012  *RADIOLOGY REPORT*  Clinical Data: Chest pain, abdominal pain.  CT ABDOMEN AND PELVIS WITH CONTRAST  Technique:  Multidetector CT imaging of the abdomen and pelvis was performed following the standard protocol during bolus administration of intravenous contrast.  Contrast: OMNIPAQUE IOHEXOL 300 MG/ML  SOLN  Comparison: 02/07/2010 radiograph  Findings: Circumferential distal esophageal wall thickening.  And there are a couple calcific densities within the lower lobes, most in keeping with sequelae of prior granulomas infection.  Intra  and extrahepatic biliary ductal dilatation.  Infiltrative porta hepatis soft tissue inseparable from the CBD and gallbladder. Duodenal diverticulum versus contained perforation.  Unremarkable spleen and.  Homogeneous pancreatic enhancement. Unremarkable adrenal glands.  Symmetric renal enhancement.  No hydronephrosis or hydroureter.  Colonic diverticulosis.  No CT evidence for colitis or diverticulitis.  Appendix not identified.  No right lower quadrant inflammation.  Small bowel loops are normal course and caliber.  There is scattered atherosclerotic calcification of the aorta and its branches. No aneurysmal dilatation.  Thin-walled bladder.  Prostatomegaly.  Fat containing left greater than right inguinal hernias.  Osteopenia and multilevel degenerative change.  No acute osseous finding.  IMPRESSION: Intra and extrahepatic biliary ductal dilatation to the level of the ampulla. Infiltration of  the porta hepatis fat.  Recommend ERCP. Cholangitis or underlying mass is not excluded.  Duodenal diverticulum or less likely contained perforation.  No free intraperitoneal air or fluid.  Nonspecific circumferential distal esophageal wall thickening, may reflect esophagitis or gastroesophageal reflux disease.   Original Report Authenticated By: Jearld Lesch, M.D.    Dg Chest Portable 1 View  07/22/2012  *RADIOLOGY REPORT*  Clinical Data: Chest pain  PORTABLE CHEST - 1 VIEW  Comparison: CT chest dated 02/07/2010  Findings: Chronic interstitial markings.  Biapical pleural parenchymal scarring, right greater than left.  No focal consolidation.  No pleural effusion or pneumothorax.  The heart is top normal in size.  IMPRESSION: No evidence of acute cardiopulmonary disease.   Original Report Authenticated By: Charline Bills, M.D.    Dg Ercp Biliary & Pancreatic Ducts  07/24/2012  *RADIOLOGY REPORT*  Clinical Data: Common bile duct stone, obstructive jaundice  ERCP  Comparison:  CT abdomen pelvis - 07/23/2012  Findings:  Multiple spot fluoroscopic radiographic images during ERCP are provided for review.  Images demonstrate an ERCP probe overlying the right upper abdominal quadrant.  There is selective cannulation of the common bile duct.  Contrast injection demonstrates marked dilatation of the extra and intrahepatic biliary system as demonstrated on preprocedural abdominal CT.  There is mild apparent narrowing of the distal end of the common bile duct regional to the ampulla of Vater.  Subsequent images demonstrate sweeping of the dilated common bile duct with a sphincterotomy balloon.  Completion image demonstrates placement of a plastic indwelling biliary stent within the distal CBD.  A large duodenal diverticulum is suspected.  IMPRESSION: 1.  ERCP with sphincterotomy and stent placement as above. 2.  Moderate intra and extrahepatic biliary ductal dilatation with apparent mild irregular narrowing of the  distal aspect of the CBD. Correlation with ERCP results is recommended.  3.  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   Original Report Authenticated By: Tacey Ruiz, MD     Microbiology: No results found for this or any previous visit (from the past 240 hour(s)).   Labs: Basic Metabolic Panel:  Recent Labs Lab 07/22/12 1556 07/23/12 1010 07/24/12 0436 07/25/12 0437 07/26/12 0903  NA 139  --  142 139 141  K 3.8  --  3.9 3.6 3.8  CL 104  --  109 106 105  CO2 25  --  23 25 27   GLUCOSE 118*  --  94 112* 134*  BUN 12  --  8 9 8   CREATININE 1.12 1.02 0.97 1.02 1.09  CALCIUM 9.0  --  8.4 8.5 9.1   Liver Function Tests:  Recent Labs Lab 07/22/12 1556 07/24/12 0436 07/25/12 0437 07/26/12 0903  AST 112* 94* 63*  62* 132*  ALT  217* 164* 127*  131* 163*  ALKPHOS 314* 312* 321*  327* 330*  BILITOT 5.2* 5.8* 3.3*  3.3* 2.4*  PROT 7.1 6.0 5.6*  5.6* 6.4  ALBUMIN 3.4* 2.9* 2.7*  2.7* 3.1*    Recent Labs Lab 07/22/12 1832  LIPASE 28    Recent Labs Lab 07/22/12 1741  AMMONIA 25   CBC:  Recent Labs Lab 07/22/12 1556 07/23/12 1010 07/24/12 0436 07/25/12 0437  WBC 6.4 5.5 4.8 6.1  NEUTROABS 4.3  --   --   --   HGB 12.9* 13.2 11.5* 11.0*  HCT 37.3* 38.7* 33.7* 32.1*  MCV 96.1 96.0 96.6 96.1  PLT 204 206 199 205   Cardiac Enzymes:  Recent Labs Lab 07/22/12 1556 07/22/12 1832 07/23/12 1010 07/23/12 1523 07/23/12 2059  TROPONINI <0.30 <0.30 <0.30 <0.30 <0.30   BNP: BNP (last 3 results) No results found for this basename: PROBNP,  in the last 8760 hours CBG: No results found for this basename: GLUCAP,  in the last 168 hours     Signed:  FISHER,DENISE  [facility] 07/26/2012, 2:39 PM

## 2012-07-26 NOTE — Progress Notes (Signed)
Pt transported to [facility] in [place] via [facility]. Pt transported in safe and stable condition. Report given.

## 2012-07-29 ENCOUNTER — Encounter: Payer: Self-pay | Admitting: Internal Medicine

## 2012-07-30 ENCOUNTER — Encounter: Payer: Self-pay | Admitting: *Deleted

## 2012-07-30 NOTE — Progress Notes (Signed)
Path and letter faxed to PCP, letter mailed to pt, appt scheduled 

## 2012-07-30 NOTE — Progress Notes (Unsigned)
Letter from: Corbin Ade  Reason for Letter: Results Review  Send letter to patient.  Send copy of letter with path to referring provider and PCP.   Needs f/u appt in 1 month; will need egd w a f/u ercp w stent removal couple a months from now

## 2012-09-20 ENCOUNTER — Encounter: Payer: Self-pay | Admitting: Internal Medicine

## 2012-09-24 ENCOUNTER — Telehealth: Payer: Self-pay | Admitting: Gastroenterology

## 2012-09-24 ENCOUNTER — Ambulatory Visit: Payer: Medicare Other | Admitting: Gastroenterology

## 2012-09-24 NOTE — Telephone Encounter (Signed)
Pt was a no show

## 2012-09-27 ENCOUNTER — Telehealth: Payer: Self-pay | Admitting: Internal Medicine

## 2012-09-27 NOTE — Telephone Encounter (Signed)
Pt is aware of OV on Monday 5/19 at 1pm with [facility] and son is also aware

## 2012-09-27 NOTE — Telephone Encounter (Signed)
I tried calling patient, but it was the wrong number. I called his emergency contract (son, Marcial Pacas) and told him that we were trying to reach patient and did he have a number we could reach him. Son gave me new number and said if patient doesn't recognize the number he will not answer the phone and if I couldn't reach him to call him (son) back. I called patient at [phone] and spoke with him about missing his appointment on Monday and he said that he forgot. I offered to get him rescheduled and explained it was about his stent removal, but he said he didn't feel like he needed to come back and asked if I could call him back later because he was outside. Could you call patient and explain to him the importance of OV and I will get him scheduled with [facility] in [facility] for next week.

## 2012-09-27 NOTE — Telephone Encounter (Signed)
Pt needs ov please

## 2012-09-27 NOTE — Telephone Encounter (Signed)
Darl Pikes made ov for pt.

## 2012-09-27 NOTE — Telephone Encounter (Signed)
We need to get this patient into the office ASAP. He no showed this week. He has a biliary stent which needs to be removed. Please stress to the patient that this stent CANNOT be left in.

## 2012-10-01 ENCOUNTER — Encounter: Payer: Self-pay | Admitting: Gastroenterology

## 2012-10-01 ENCOUNTER — Ambulatory Visit (INDEPENDENT_AMBULATORY_CARE_PROVIDER_SITE_OTHER): Payer: Medicare Other | Admitting: Gastroenterology

## 2012-10-01 VITALS — BP 96/60 | HR 107 | Temp 97.4°F | Ht 75.0 in | Wt 184.2 lb

## 2012-10-01 DIAGNOSIS — K8020 Calculus of gallbladder without cholecystitis without obstruction: Secondary | ICD-10-CM

## 2012-10-01 DIAGNOSIS — K2210 Ulcer of esophagus without bleeding: Secondary | ICD-10-CM

## 2012-10-01 DIAGNOSIS — R945 Abnormal results of liver function studies: Secondary | ICD-10-CM

## 2012-10-01 DIAGNOSIS — R7989 Other specified abnormal findings of blood chemistry: Secondary | ICD-10-CM

## 2012-10-01 DIAGNOSIS — K208 Other esophagitis without bleeding: Secondary | ICD-10-CM

## 2012-10-01 DIAGNOSIS — K8051 Calculus of bile duct without cholangitis or cholecystitis with obstruction: Secondary | ICD-10-CM

## 2012-10-01 DIAGNOSIS — D649 Anemia, unspecified: Secondary | ICD-10-CM

## 2012-10-01 NOTE — Progress Notes (Signed)
Primary Care Physician:  TAPPER,DAVID B, MD  Primary Gastroenterologist:  Michael Rourk, MD   Chief Complaint  Patient presents with  . Follow-up    HPI:  Edward Trevino is a 79 y.o. male here for biliary stent removal. He had ERCP with biliary sphincterotomy, ampullary balloon dilation, balloon stone extraction, biliary stent placement along with EGD and duodenal biopsy on 07/24/2012 when patient presented with obstructive jaundice. Also noted to have severe erosive esophagitis, abnormalities of the bulb and proximal second portion of the duodenum resulting in a partial gastric outlet obstruction. Biopsy showed benign ulcer. H. pylori serologies were negative. CT had shown intra-and extrahepatic biliary ductal dilation. Infiltrative portal hepatus soft tissue inseparable from the common bile duct and gallbladder.  Patient states he's been doing reasonably well. His appetite is okay. He eats when he feels hungry. Denies any weight loss.  No abdominal pain. No heartburn. BM regular. No melena, brbpr. No prior colonoscopy. No dysphagia. Denies etoh use. Bottle of wine every six months. There is report of alcohol abuse in his chart (per son's report) but he denies any history of significant alcohol use. He also tells me he does not want to have his gallbladder out less he has to.   Current Outpatient Prescriptions  Medication Sig Dispense Refill  . acetaminophen (TYLENOL) 500 MG tablet Take 1,000 mg by mouth daily as needed. For pain       . clonazePAM (KLONOPIN) 0.5 MG tablet Take 1 tablet (0.5 mg total) by mouth 3 (three) times daily. Takes one tablet twice daily and 2 tablets at bedtime.  90 tablet  0  . mirtazapine (REMERON) 30 MG tablet Take 1 tablet (30 mg total) by mouth at bedtime.  30 tablet  0  . Multiple Vitamin (MULTIVITAMIN WITH MINERALS) TABS Take 1 tablet by mouth daily.      . pantoprazole (PROTONIX) 40 MG tablet Take 1 tablet (40 mg total) by mouth 2 (two) times daily.  60 tablet  3   . QUEtiapine (SEROQUEL) 100 MG tablet Take 1 tablet (100 mg total) by mouth 3 (three) times daily.  90 tablet  0  . venlafaxine XR (EFFEXOR-XR) 75 MG 24 hr capsule Take 1 capsule (75 mg total) by mouth daily.  30 capsule  0   No current facility-administered medications for this visit.    Allergies as of 10/01/2012  . (No Known Allergies)    Past Medical History  Diagnosis Date  . Depression   . Insomnia   . Anxiety   . Suicide attempt   . Erosive esophagitis 07/25/2012  . Gastric out let obstruction 07/25/2012    Past Surgical History  Procedure Laterality Date  . Appendectomy    . Back surgery    . Ercp N/A 07/24/2012    Dr. Rourk. Significant abnormalities of the bowl and proximal second portion producing partial gastric outlet stricture and with secondary gastric dilation and severe erosive reflux esophagitis. Biopsy showed benign ulceration. Normal-appearing ampulla, status post biliary sphincterotomy and ampulla balloon dilation, stone extraction and stent placement.  . Sphincterotomy N/A 07/24/2012    Procedure: SPHINCTEROTOMY;  Surgeon: Robert M Rourk, MD;  Location: [facility];  Service: Endoscopy;  Laterality: N/A;  . Esophageal biopsy N/A 07/24/2012    Procedure: BIOPSY;  Surgeon: Robert M Rourk, MD;  Location: [facility];  Service: Endoscopy;  Laterality: N/A;  Duodenal and Esophageal Biopsies  . Esophagogastroduodenoscopy N/A 07/24/2012    RMR:Normal-appearing ampulla s/p biliary sphincterotomy and ampullary  . Balloon   dilation N/A 07/24/2012    Procedure: BALLOON DILATION;  Surgeon: Robert M Rourk, MD;  Location: [facility];  Service: Endoscopy;  Laterality: N/A;  Balloon Stone Extraction, Drudging  . Removal of stones N/A 07/24/2012    Procedure: REMOVAL OF STONES;  Surgeon: Robert M Rourk, MD;  Location: [facility];  Service: Endoscopy;  Laterality: N/A;    Family History  Problem Relation Age of Onset  . Colon cancer Neg Hx     History   Social History  . Marital Status:  Single    Spouse Name: N/A    Number of Children: N/A  . Years of Education: N/A   Occupational History  . retired    Social History Main Topics  . Smoking status: Current Every Day Smoker    Types: Cigars  . Smokeless tobacco: Not on file  . Alcohol Use: No     Comment: reported from ED son stated he has been drinking a lot of wine recently  . Drug Use: No  . Sexually Active: No   Other Topics Concern  . Not on file   Social History Narrative  . No narrative on file      ROS:  General: Negative for anorexia, weight loss, fever, chills, fatigue, weakness. Eyes: Negative for vision changes.  ENT: Negative for hoarseness, difficulty swallowing , nasal congestion. CV: Negative for chest pain, angina, palpitations, dyspnea on exertion, peripheral edema.  Respiratory: Negative for dyspnea at rest, dyspnea on exertion, cough, sputum, wheezing.  GI: See history of present illness. GU:  Negative for dysuria, hematuria, urinary incontinence, urinary frequency, nocturnal urination.  MS: Negative for joint pain, low back pain.  Derm: Negative for rash or itching.  Neuro: Negative for weakness, abnormal sensation, seizure, frequent headaches, memory loss, confusion.  Psych: Negative for anxiety, depression, suicidal ideation, hallucinations.  Endo: Negative for unusual weight change.  Heme: Negative for bruising or bleeding. Allergy: Negative for rash or hives.    Physical Examination:  BP 96/60  Pulse 107  Temp(Src) 97.4 F (36.3 C) (Oral)  Ht 6' 3" (1.905 m)  Wt 184 lb 3.2 oz (83.553 kg)  BMI 23.02 kg/m2   General: Elderly, somewhat disheveled appearing white male in no acute distress. Accompanied by friend.  Head: Normocephalic, atraumatic.   Eyes: Conjunctiva pink, no icterus. Mouth: Oropharyngeal mucosa moist and pink , no lesions erythema or exudate. Neck: Supple without thyromegaly, masses, or lymphadenopathy.  Lungs: Clear to auscultation bilaterally.  Heart:  Regular rate and rhythm, no murmurs rubs or gallops.  Abdomen: Bowel sounds are normal, nontender, nondistended, no hepatosplenomegaly or masses, no abdominal bruits or    hernia , no rebound or guarding.   Rectal: Not performed Extremities: No lower extremity edema. No clubbing or deformities.  Neuro: Alert and oriented x 4 , grossly normal neurologically.  Skin: Warm and dry, no rash or jaundice.   Psych: Alert and cooperative, normal mood and affect.  Labs: Lab Results  Component Value Date   CREATININE 1.09 07/26/2012   BUN 8 07/26/2012   NA 141 07/26/2012   K 3.8 07/26/2012   CL 105 07/26/2012   CO2 27 07/26/2012   Lab Results  Component Value Date   ALT 163* 07/26/2012   AST 132* 07/26/2012   ALKPHOS 330* 07/26/2012   BILITOT 2.4* 07/26/2012   Lab Results  Component Value Date   WBC 6.1 07/25/2012   HGB 11.0* 07/25/2012   HCT 32.1* 07/25/2012   MCV 96.1 07/25/2012   PLT   205 07/25/2012     Imaging Studies: No results found.    

## 2012-10-01 NOTE — Patient Instructions (Addendum)
1. Please have your bloodwork done. 2. We have scheduled you for an endoscopy and ERCP with bile stent removal. Please see separate instructions.

## 2012-10-01 NOTE — Assessment & Plan Note (Signed)
79 year old gentleman seen during recent hospitalization in March with obstructing jaundice. CT showed Intra and extrahepatic biliary ductal dilatation to the level of the ampulla. Infiltration of the porta hepatis fat. EGD showed severe erosive reflux esophagitis and abnormal bulb and proximal second portion duodenum producing partial GOO with secondary gastric dilation. Biopsies were benign. ERCP with biliary sphincterotomy, ampullary balloon dilation, balloon stone extraction, biliary stent placement was performed. Biliary stent remains in place at this time. Clinically he has been doing well. Need to repeat LFTs, followup on anemia. Discussed at length with Dr. Jena Gauss. At this point we will pursue ERCP/EGD with stent removal. Based on findings he may or may not need further imaging such as CT scan or EUS.  I have discussed the risks, alternatives, benefits with regards to but not limited to the risk of reaction to medication, bleeding, infection, perforation. Discussed 10% chance of pancreatitis. Patient is agreeable. Written consent to be obtained.

## 2012-10-01 NOTE — Progress Notes (Signed)
Cc PCP 

## 2012-10-03 NOTE — Telephone Encounter (Signed)
Pt was [facility] to be seen on 5/19 at 115 with LSL

## 2012-10-09 ENCOUNTER — Encounter (HOSPITAL_COMMUNITY): Payer: Self-pay | Admitting: Pharmacy Technician

## 2012-10-15 ENCOUNTER — Encounter (HOSPITAL_COMMUNITY): Payer: Self-pay

## 2012-10-15 ENCOUNTER — Encounter (HOSPITAL_COMMUNITY)
Admission: RE | Admit: 2012-10-15 | Discharge: 2012-10-15 | Disposition: A | Discharge status: Home / Self Care | Payer: Medicare Other | Source: Ambulatory Visit | Attending: Internal Medicine | Admitting: Internal Medicine

## 2012-10-15 HISTORY — DX: Unspecified hearing loss, unspecified ear: H91.90

## 2012-10-15 LAB — BASIC METABOLIC PANEL
BUN: 9 mg/dL (ref 6–23)
Chloride: 101 mEq/L (ref 96–112)
Creatinine, Ser: 1.11 mg/dL (ref 0.50–1.35)
GFR calc Af Amer: 71 mL/min — ABNORMAL LOW (ref >90)
GFR calc non Af Amer: 62 mL/min — ABNORMAL LOW (ref >90)
Potassium: 3.9 mEq/L (ref 3.5–5.1)
Sodium: 140 mEq/L (ref 135–145)

## 2012-10-15 LAB — HEMOGLOBIN AND HEMATOCRIT, BLOOD
HCT: 38.8 % — ABNORMAL LOW (ref 39.0–52.0)
Hemoglobin: 13.8 g/dL (ref 13.0–17.0)

## 2012-10-15 NOTE — Patient Instructions (Addendum)
Edward Trevino  10/15/2012   Your procedure is scheduled on:  10/18/2012  Report to [facility] at  800  AM.  Call this number if you have problems the morning of surgery: [phone]   Remember:   Do not eat food or drink liquids after midnight.   Take these medicines the morning of surgery with A SIP OF WATER: klonopin,effexor   Do not wear jewelry, make-up or nail polish.  Do not wear lotions, powders, or perfumes.   Do not shave 48 hours prior to surgery. Men may shave face and neck.  Do not bring valuables to the hospital.  [facility] is not responsile  for any belongings or valuables.  Contacts, dentures or bridgework may not be worn into surgery.  Leave suitcase in the car. After surgery it may be brought to your room.  For patients admitted to the hospital, checkout time is 11:00 AM the day of discharge.   Patients discharged the day of surgery will not be allowed to drive  home.  Name and phone number of your driver: family  Special Instructions: N/A   Please read over the following fact sheets that you were given: Pain Booklet, Coughing and Deep Breathing, MRSA Information, Surgical Site Infection Prevention, Anesthesia Post-op Instructions and Care and Recovery After Surgery Endoscopic Retrograde Cholangiopancreatography This is a test used to evaluate people with jaundice (a condition in which the person's skin is turning yellow because of the high level of bilirubin in your blood). Bilirubin is a product of the blood which is elevated when an obstruction in the bile duct occurs. The bile ducts are the pipe-like system which carry the bile from the liver to the gallbladder and out into your small bowel. In this test, a fiber optic endoscope (a small pencil-sized telescope) is inserted and a catheter is put into ducts for examination. This test can reveal stones, strictures, cysts, tumors, and other irregularities within the pancreatic ducts and bile ducts. PREPARATION FOR  TEST Do not eat or drink after midnight the day before the test.  NORMAL FINDINGS Normal size of biliary and pancreatic ducts. No obstruction or filling defects within the biliary or pancreatic ducts. Ranges for normal findings may vary among different laboratories and hospitals. You should always check with your doctor after having lab work or other tests done to discuss the meaning of your test results and whether your values are considered within normal limits. MEANING OF TEST  Your caregiver will go over the test results with you and discuss the importance and meaning of your results, as well as treatment options and the need for additional tests if necessary. OBTAINING THE TEST RESULTS  It is your responsibility to obtain your test results. Ask the lab or department performing the test when and how you will get your results. Document Released: 08/26/2004 Document Revised: 01/12/2011 Document Reviewed: 04/11/2008 [facility] Patient Information 2012 [place], [place].Esophagogastroduodenoscopy Esophagogastroduodenoscopy (EGD) is a procedure to examine the lining of the esophagus, stomach, and first part of the small intestine (duodenum). A long, flexible, lighted tube with a camera attached (endoscope) is inserted down the throat to view these organs. This procedure is done to detect problems or abnormalities, such as inflammation, bleeding, ulcers, or growths, in order to treat them. The procedure lasts about 5 20 minutes. It is usually an outpatient procedure, but it may need to be performed in emergency cases in the hospital. LET YOUR CAREGIVER KNOW ABOUT:  Allergies to food or medicine.  All medicines you are taking, including vitamins, herbs, eyedrops, and over-the-counter medicines and creams.  Use of steroids (by mouth or creams).  Previous problems you or members of your family have had with the use of anesthetics.  Any blood disorders you have.  Previous surgeries you have  had.  Other health problems you have.  Possibility of pregnancy, if this applies. RISKS AND COMPLICATIONS  Generally, EGD is a safe procedure. However, as with any procedure, complications can occur. Possible complications include:  Infection.  Bleeding.  Tearing (perforation) of the esophagus, stomach, or duodenum.  Difficulty breathing or not being able to breath.  Excessive sweating.  Spasms of the larynx.  Slowed heartbeat.  Low blood pressure. BEFORE THE PROCEDURE  Do not eat or drink anything for 6 8 hours before the procedure or as directed by your caregiver.  Ask your caregiver about changing or stopping your regular medicines.  If you wear dentures, be prepared to remove them before the procedure.  Arrange for someone to drive you home after the procedure. PROCEDURE   A vein will be accessed to give medicines and fluids. A medicine to relax you (sedative) and a pain reliever will be given through that access into the vein.  A numbing medicine (local anesthetic) may be sprayed on your throat for comfort and to stop you from gagging or coughing.  A mouth guard may be placed in your mouth to protect your teeth and to keep you from biting on the endoscope.  You will be asked to lie on your left side.  The endoscope is inserted down your throat and into the esophagus, stomach, and duodenum.  Air is put through the endoscope to allow your caregiver to view the lining of your esophagus clearly.  The esophagus, stomach, and duodenum is then examined. During the exam, your caregiver may:  Remove tissue to be examined under a microscope (biopsy) for inflammation, infection, or other medical problems.  Remove growths.  Remove objects (foreign bodies) that are stuck.  Treat any bleeding with medicines or other devices that stop tissues from bleeding (hot cauters, clipping devices).  Widen (dilate) or stretch narrowed areas of the esophagus and stomach.  The  endoscope will then be withdrawn. AFTER THE PROCEDURE  You will be taken to a recovery area to be monitored. You will be able to go home once you are stable and alert.  Do not eat or drink anything until the local anesthetic and numbing medicines have worn off. You may choke.  It is normal to feel bloated, have pain with swallowing, or have a sore throat for a short time. This will wear off.  Your caregiver should be able to discuss his or her findings with you. It will take longer to discuss the test results if any biopsies were taken. Document Released: 09/02/2004 Document Revised: 04/18/2012 Document Reviewed: 04/04/2012 [facility] Patient Information 2014 [place], [place]. PATIENT INSTRUCTIONS POST-ANESTHESIA  IMMEDIATELY FOLLOWING SURGERY:  Do not drive or operate machinery for the first twenty four hours after surgery.  Do not make any important decisions for twenty four hours after surgery or while taking narcotic pain medications or sedatives.  If you develop intractable nausea and vomiting or a severe headache please notify your doctor immediately.  FOLLOW-UP:  Please make an appointment with your surgeon as instructed. You do not need to follow up with anesthesia unless specifically instructed to do so.  WOUND CARE INSTRUCTIONS (if applicable):  Keep a  dry clean dressing on the anesthesia/puncture wound site if there is drainage.  Once the wound has quit draining you may leave it open to air.  Generally you should leave the bandage intact for twenty four hours unless there is drainage.  If the epidural site drains for more than 36-48 hours please call the anesthesia department.  QUESTIONS?:  Please feel free to call your physician or the hospital operator if you have any questions, and they will be happy to assist you.

## 2012-10-16 NOTE — Progress Notes (Signed)
Received lab work dated 10/05/2012: Glucose 146, BUN 10, creatinine 1.09, calcium 8.7, total bilirubin 0.3, alkaline phosphatase 97, AST 23, ALT 22, albumin 4.0, lipase 27, white blood cell count 5500, hemoglobin 13.5, MCV 95.3, platelets 224,000.  Please let patient know his labs look good. ERCP/EGD as scheduled. If everything OK on this exam, would consider screening colonoscopy at later date since he's never had one per medical records.

## 2012-10-17 LAB — COMPREHENSIVE METABOLIC PANEL
ALT: 22 U/L (ref 10–40)
AST: 23 U/L
Calcium: 8.7 mg/dL
Creat: 1.09
Glucose: 146
Lipase: 27 units/L (ref 0–53)
Potassium: 3.7 mmol/L
Sodium: 137 mmol/L (ref 137–147)
Total Bilirubin: 0.3 mg/dL

## 2012-10-17 LAB — CBC: platelet count: 224

## 2012-10-18 ENCOUNTER — Encounter (HOSPITAL_COMMUNITY): Payer: Self-pay

## 2012-10-18 ENCOUNTER — Ambulatory Visit (HOSPITAL_COMMUNITY): Payer: Medicare Other | Admitting: Anesthesiology

## 2012-10-18 ENCOUNTER — Encounter (HOSPITAL_COMMUNITY): Payer: Self-pay | Admitting: Anesthesiology

## 2012-10-18 ENCOUNTER — Ambulatory Visit (HOSPITAL_COMMUNITY)
Admission: RE | Admit: 2012-10-18 | Discharge: 2012-10-18 | Disposition: A | Discharge status: Home / Self Care | Payer: Medicare Other | Source: Ambulatory Visit | Attending: Internal Medicine | Admitting: Internal Medicine

## 2012-10-18 ENCOUNTER — Encounter (HOSPITAL_COMMUNITY)
Admission: RE | Disposition: A | Discharge status: Home / Self Care | Payer: Self-pay | Source: Ambulatory Visit | Attending: Internal Medicine

## 2012-10-18 ENCOUNTER — Ambulatory Visit (HOSPITAL_COMMUNITY): Payer: Medicare Other

## 2012-10-18 DIAGNOSIS — F341 Dysthymic disorder: Secondary | ICD-10-CM | POA: Diagnosis present

## 2012-10-18 DIAGNOSIS — K2210 Ulcer of esophagus without bleeding: Secondary | ICD-10-CM

## 2012-10-18 DIAGNOSIS — Z79899 Other long term (current) drug therapy: Secondary | ICD-10-CM | POA: Insufficient documentation

## 2012-10-18 DIAGNOSIS — E860 Dehydration: Secondary | ICD-10-CM | POA: Diagnosis present

## 2012-10-18 DIAGNOSIS — R110 Nausea: Secondary | ICD-10-CM | POA: Insufficient documentation

## 2012-10-18 DIAGNOSIS — R109 Unspecified abdominal pain: Principal | ICD-10-CM | POA: Diagnosis present

## 2012-10-18 DIAGNOSIS — K8051 Calculus of bile duct without cholangitis or cholecystitis with obstruction: Secondary | ICD-10-CM

## 2012-10-18 DIAGNOSIS — R945 Abnormal results of liver function studies: Secondary | ICD-10-CM

## 2012-10-18 DIAGNOSIS — K8050 Calculus of bile duct without cholangitis or cholecystitis without obstruction: Secondary | ICD-10-CM

## 2012-10-18 DIAGNOSIS — K311 Adult hypertrophic pyloric stenosis: Secondary | ICD-10-CM | POA: Insufficient documentation

## 2012-10-18 DIAGNOSIS — Z01812 Encounter for preprocedural laboratory examination: Secondary | ICD-10-CM | POA: Insufficient documentation

## 2012-10-18 DIAGNOSIS — D649 Anemia, unspecified: Secondary | ICD-10-CM

## 2012-10-18 DIAGNOSIS — R7989 Other specified abnormal findings of blood chemistry: Secondary | ICD-10-CM

## 2012-10-18 DIAGNOSIS — K8020 Calculus of gallbladder without cholecystitis without obstruction: Secondary | ICD-10-CM

## 2012-10-18 DIAGNOSIS — F17200 Nicotine dependence, unspecified, uncomplicated: Secondary | ICD-10-CM | POA: Diagnosis present

## 2012-10-18 HISTORY — PX: REMOVAL OF STONES: SHX5545

## 2012-10-18 HISTORY — PX: BIOPSY: SHX5522

## 2012-10-18 HISTORY — PX: ERCP: SHX5425

## 2012-10-18 HISTORY — PX: ESOPHAGOGASTRODUODENOSCOPY (EGD) WITH PROPOFOL: SHX5813

## 2012-10-18 HISTORY — PX: OTHER SURGICAL HISTORY: SHX169

## 2012-10-18 SURGERY — ESOPHAGOGASTRODUODENOSCOPY (EGD) WITH PROPOFOL
Anesthesia: General

## 2012-10-18 MED ORDER — LIDOCAINE HCL (PF) 1 % IJ SOLN
INTRAMUSCULAR | Status: AC
  Filled 2012-10-18: qty 5

## 2012-10-18 MED ORDER — STERILE WATER FOR IRRIGATION IR SOLN
Status: DC | PRN
  Administered 2012-10-18: 10:00:00

## 2012-10-18 MED ORDER — CIPROFLOXACIN IN D5W 400 MG/200ML IV SOLN
400.0000 mg | Freq: Two times a day (BID) | INTRAVENOUS | Status: DC
  Administered 2012-10-18: 400 mg via INTRAVENOUS

## 2012-10-18 MED ORDER — FENTANYL CITRATE 0.05 MG/ML IJ SOLN: 25.0000 ug | INTRAMUSCULAR | Status: DC | PRN

## 2012-10-18 MED ORDER — SUCCINYLCHOLINE CHLORIDE 20 MG/ML IJ SOLN: INTRAMUSCULAR | Status: DC | PRN

## 2012-10-18 MED ORDER — ROCURONIUM BROMIDE 100 MG/10ML IV SOLN: INTRAVENOUS | Status: DC | PRN

## 2012-10-18 MED ORDER — FENTANYL CITRATE 0.05 MG/ML IJ SOLN
INTRAMUSCULAR | Status: DC | PRN
  Administered 2012-10-18 (×3): 50 ug via INTRAVENOUS

## 2012-10-18 MED ORDER — ONDANSETRON HCL 4 MG/2ML IJ SOLN
INTRAMUSCULAR | Status: AC
  Filled 2012-10-18: qty 2

## 2012-10-18 MED ORDER — BUTAMBEN-TETRACAINE-BENZOCAINE 2-2-14 % EX AERO
1.0000 | INHALATION_SPRAY | Freq: Once | CUTANEOUS | Status: AC
  Administered 2012-10-18: 1 via TOPICAL
  Filled 2012-10-18: qty 56

## 2012-10-18 MED ORDER — ROCURONIUM BROMIDE 100 MG/10ML IV SOLN
INTRAVENOUS | Status: DC | PRN
  Administered 2012-10-18: 5 mg via INTRAVENOUS

## 2012-10-18 MED ORDER — MIDAZOLAM HCL 2 MG/2ML IJ SOLN
1.0000 mg | INTRAMUSCULAR | Status: DC | PRN
  Administered 2012-10-18: 2 mg via INTRAVENOUS

## 2012-10-18 MED ORDER — LIDOCAINE HCL (CARDIAC) 20 MG/ML IV SOLN
INTRAVENOUS | Status: DC | PRN
  Administered 2012-10-18: 30 mg via INTRAVENOUS

## 2012-10-18 MED ORDER — PROPOFOL 10 MG/ML IV BOLUS
INTRAVENOUS | Status: DC | PRN
  Administered 2012-10-18: 150 mg via INTRAVENOUS

## 2012-10-18 MED ORDER — ROCURONIUM BROMIDE 50 MG/5ML IV SOLN
INTRAVENOUS | Status: AC
  Filled 2012-10-18: qty 1

## 2012-10-18 MED ORDER — MIDAZOLAM HCL 2 MG/2ML IJ SOLN
INTRAMUSCULAR | Status: AC
  Filled 2012-10-18: qty 2

## 2012-10-18 MED ORDER — GLYCOPYRROLATE 0.2 MG/ML IJ SOLN
INTRAMUSCULAR | Status: AC
  Filled 2012-10-18: qty 1

## 2012-10-18 MED ORDER — ONDANSETRON HCL 4 MG/2ML IJ SOLN: 4.0000 mg | Freq: Once | INTRAMUSCULAR | Status: DC | PRN

## 2012-10-18 MED ORDER — EPHEDRINE SULFATE 50 MG/ML IJ SOLN
INTRAMUSCULAR | Status: AC
  Filled 2012-10-18: qty 1

## 2012-10-18 MED ORDER — SUCCINYLCHOLINE CHLORIDE 20 MG/ML IJ SOLN
INTRAMUSCULAR | Status: DC | PRN
  Administered 2012-10-18: 120 mg via INTRAVENOUS

## 2012-10-18 MED ORDER — PROPOFOL 10 MG/ML IV EMUL
INTRAVENOUS | Status: AC
  Filled 2012-10-18: qty 20

## 2012-10-18 MED ORDER — SODIUM CHLORIDE 0.9 % IV SOLN
INTRAVENOUS | Status: DC | PRN
  Administered 2012-10-18: 10:00:00

## 2012-10-18 MED ORDER — FENTANYL CITRATE 0.05 MG/ML IJ SOLN
INTRAMUSCULAR | Status: AC
  Filled 2012-10-18: qty 5

## 2012-10-18 MED ORDER — ONDANSETRON HCL 4 MG/2ML IJ SOLN
4.0000 mg | Freq: Once | INTRAMUSCULAR | Status: AC
  Administered 2012-10-18: 4 mg via INTRAVENOUS

## 2012-10-18 MED ORDER — CIPROFLOXACIN IN D5W 400 MG/200ML IV SOLN
INTRAVENOUS | Status: AC
  Filled 2012-10-18: qty 200

## 2012-10-18 MED ORDER — GLYCOPYRROLATE 0.2 MG/ML IJ SOLN
0.2000 mg | Freq: Once | INTRAMUSCULAR | Status: AC
  Administered 2012-10-18: 0.2 mg via INTRAVENOUS

## 2012-10-18 MED ORDER — EPHEDRINE SULFATE 50 MG/ML IJ SOLN
INTRAMUSCULAR | Status: DC | PRN
  Administered 2012-10-18: 5 mg via INTRAVENOUS

## 2012-10-18 MED ORDER — LACTATED RINGERS IV SOLN
INTRAVENOUS | Status: DC
  Administered 2012-10-18 (×2): via INTRAVENOUS

## 2012-10-18 MED ORDER — SUCCINYLCHOLINE CHLORIDE 20 MG/ML IJ SOLN
INTRAMUSCULAR | Status: AC
  Filled 2012-10-18: qty 1

## 2012-10-18 SURGICAL SUPPLY — 45 items
BALLN CRE LF 10-12 240X5.5 (BALLOONS) ×2
BALLN DILATOR CRE 12-15 240 (BALLOONS) ×2
BALLN DILATOR CRE 15-18 240 (BALLOONS) ×1 IMPLANT
BALLN RETRIEVAL 12X15 (BALLOONS) ×1 IMPLANT
BALLOON CRE LF 10-12 240X5.5 (BALLOONS) IMPLANT
BALLOON DILATOR CRE 12-15 240 (BALLOONS) IMPLANT
BALN RTRVL 200 6-7FR 12-15 (BALLOONS) ×1
BASKET TRAPEZOID 3X6 (MISCELLANEOUS) IMPLANT
BASKET TRAPEZOID LITHO 2.5X5 (MISCELLANEOUS) IMPLANT
BLOCK BITE 60FR ADLT L/F BLUE (MISCELLANEOUS) ×1 IMPLANT
BSKT STON RTRVL TRAPEZOID 3X6 (MISCELLANEOUS)
DEVICE CLIP HEMOSTAT 235CM (CLIP) IMPLANT
DEVICE INFLATION ENCORE 26 (MISCELLANEOUS) IMPLANT
DEVICE LOCKING W-BIOPSY CAP (MISCELLANEOUS) ×2 IMPLANT
ELECT REM PT RETURN 9FT ADLT (ELECTROSURGICAL) ×2
ELECTRODE REM PT RTRN 9FT ADLT (ELECTROSURGICAL) IMPLANT
FLOOR PAD 36X40 (MISCELLANEOUS) ×2
FORCEPS BIOP RAD 4 LRG CAP 4 (CUTTING FORCEPS) ×1 IMPLANT
GUIDEWIRE HYDRA JAGWIRE .35 (WIRE) IMPLANT
GUIDEWIRE JAG HINI 025X260CM (WIRE) IMPLANT
MANIFOLD NEPTUNE II (INSTRUMENTS) ×1 IMPLANT
NDL HYPO 18GX1.5 BLUNT FILL (NEEDLE) IMPLANT
NDL SCLEROTHERAPY 25GX240 (NEEDLE) IMPLANT
NEEDLE HYPO 18GX1.5 BLUNT FILL (NEEDLE) ×2 IMPLANT
NEEDLE SCLEROTHERAPY 25GX240 (NEEDLE) IMPLANT
PAD FLOOR 36X40 (MISCELLANEOUS) IMPLANT
PROBE APC STR FIRE (PROBE) IMPLANT
PROBE INJECTION GOLD (MISCELLANEOUS)
PROBE INJECTION GOLD 7FR (MISCELLANEOUS) IMPLANT
SNARE ROTATE MED OVAL 20MM (MISCELLANEOUS) ×1 IMPLANT
SNARE SHORT THROW 13M SML OVAL (MISCELLANEOUS) ×1 IMPLANT
SNARE SHORT THROW 27M MED OVAL (MISCELLANEOUS) IMPLANT
SPHINCTEROTOME AUTOTOME .25 (MISCELLANEOUS) IMPLANT
SPHINCTEROTOME HYDRATOME 44 (MISCELLANEOUS) ×3 IMPLANT
SPONGE GAUZE 4X4 12PLY (GAUZE/BANDAGES/DRESSINGS) ×1 IMPLANT
SYR 20CC LL (SYRINGE) ×1 IMPLANT
SYR 3ML LL SCALE MARK (SYRINGE) ×1 IMPLANT
SYR 50ML LL SCALE MARK (SYRINGE) ×1 IMPLANT
SYR INFLATION 60ML (SYRINGE) ×1 IMPLANT
SYSTEM CONTINUOUS INJECTION (MISCELLANEOUS) ×2 IMPLANT
TUBING ENDO SMARTCAP PENTAX (MISCELLANEOUS) ×1 IMPLANT
TUBING IRRIGATION ENDOGATOR (MISCELLANEOUS) IMPLANT
WALLSTENT METAL COVERED 10X60 (STENTS) IMPLANT
WALLSTENT METAL COVERED 10X80 (STENTS) IMPLANT
WATER STERILE IRR 1000ML POUR (IV SOLUTION) ×1 IMPLANT

## 2012-10-18 NOTE — Anesthesia Procedure Notes (Signed)
Procedure Name: Intubation Date/Time: 10/18/2012 9:09 AM Performed by: Glynn Octave E Pre-anesthesia Checklist: Patient identified, Patient being monitored, Timeout performed, Emergency Drugs available and Suction available Patient Re-evaluated:Patient Re-evaluated prior to inductionOxygen Delivery Method: Circle System Utilized Preoxygenation: Pre-oxygenation with 100% oxygen Intubation Type: IV induction, Rapid sequence and Cricoid Pressure applied Laryngoscope Size: Mac and 3 Grade View: Grade I Tube type: Oral Tube size: 7.0 mm Number of attempts: 1 Airway Equipment and Method: stylet Placement Confirmation: ETT inserted through vocal cords under direct vision,  positive ETCO2 and breath sounds checked- equal and bilateral Secured at: 21 cm Tube secured with: Tape Dental Injury: Teeth and Oropharynx as per pre-operative assessment

## 2012-10-18 NOTE — Anesthesia Preprocedure Evaluation (Addendum)
Anesthesia Evaluation  Patient identified by MRN, date of birth, ID band Patient awake    Reviewed: Allergy & Precautions, H&P , NPO status , Patient's Chart, lab work & pertinent test results  History of Anesthesia Complications Negative for: history of anesthetic complications  Airway Mallampati: III TM Distance: >3 FB Neck ROM: Full    Dental  (+) Edentulous Upper and Edentulous Lower   Pulmonary Current Smoker,  breath sounds clear to auscultation        Cardiovascular negative cardio ROS  Rhythm:Regular Rate:Normal     Neuro/Psych PSYCHIATRIC DISORDERS Anxiety Depression    GI/Hepatic GERD-  Medicated and Controlled,  Endo/Other    Renal/GU      Musculoskeletal   Abdominal   Peds  Hematology   Anesthesia Other Findings   Reproductive/Obstetrics                           Anesthesia Physical Anesthesia Plan  ASA: III  Anesthesia Plan: General   Post-op Pain Management:    Induction: Intravenous, Rapid sequence and Cricoid pressure planned  Airway Management Planned: Oral ETT  Additional Equipment:   Intra-op Plan:   Post-operative Plan: Extubation in OR  Informed Consent: I have reviewed the patients History and Physical, chart, labs and discussed the procedure including the risks, benefits and alternatives for the proposed anesthesia with the patient or authorized representative who has indicated his/her understanding and acceptance.     Plan Discussed with:   Anesthesia Plan Comments:         Anesthesia Quick Evaluation

## 2012-10-18 NOTE — Progress Notes (Signed)
Procedure scheduled for today.

## 2012-10-18 NOTE — Anesthesia Postprocedure Evaluation (Signed)
  Anesthesia Post-op Note  Patient: Edward Trevino  Procedure(s) Performed: Procedure(s): ESOPHAGOGASTRODUODENOSCOPY (EGD) WITH PROPOFOL (N/A) ENDOSCOPIC RETROGRADE CHOLANGIOPANCREATOGRAPHY (ERCP) (N/A) STENT REMOVAL (N/A) REMOVAL OF STONES (N/A) BIOPSY (N/A)  Patient Location: PACU  Anesthesia Type:General  Level of Consciousness: awake, alert  and oriented  Airway and Oxygen Therapy: Patient Spontanous Breathing and Patient connected to face mask oxygen  Post-op Pain: none  Post-op Assessment: Post-op Vital signs reviewed, Patient's Cardiovascular Status Stable, Respiratory Function Stable, Patent Airway and No signs of Nausea or vomiting  Post-op Vital Signs: Reviewed and stable  Complications: No apparent anesthesia complications

## 2012-10-18 NOTE — Op Note (Signed)
NAME:  Edward Trevino, Edward Trevino NO.:  [id number]  MEDICAL RECORD NO.:  [id number]  LOCATION:  [facility]                          FACILITY:  [facility]  PHYSICIAN:  R. Roetta Sessions, MD FACP FACGDATE OF BIRTH:  19-Mar-1932  DATE OF PROCEDURE:  10/18/2012 DATE OF DISCHARGE:                              OPERATIVE REPORT   PROCEDURE:  ERCP with EGD, pyloric channel balloon dilation, cholangiogram with extension of prior biliary sphincterotomy, balloon dilation of sphincterotomy site, stone extraction/stent removal.  INDICATIONS FOR PROCEDURE:  A 79 year old gentleman presented with cholangitis secondary to choledocholithiasis 3 months ago.  He had numerous stones removed from his bile duct.  He underwent a sphincterotomy and balloon dilation of the sphincterotomy.  He has done very well.  His LFTs have completely normalized.  He also had marked inflammation of the pyloric channel and proximal duodenum.  Biopsies were negative for malignancy.  He returns now for stent removal and further evaluation of his pyloric channel.  We reviewed the risks of the procedure, potential for further therapeutic biliary intervention if there were additional stones present.  We talked about the risk of bleeding, perforation, 1 in 10 chance of pancreatitis and potential for subsequent procedure if a stent has to be placed.  All questions answered and all parties agreeable.  PROCEDURE NOTE:  General endotracheal anesthesia induced by Dr. [facility].  He was placed in the semi-prone position on the OR table.  PREOPERATIVE ANTIBIOTICS:  IV Cipro.  INSTRUMENTATION:  Pentax video chip side-viewing scope and diagnostic gastroscope.  FINDINGS:  Cursory examination of the distal esophagus revealed no esophagitis, gastric mucosa was slightly erythematous.  Pyloric channel was deformed and stenotic.  Initially, I was not able to advance the side-viewing scope.  I removed the scope, obtained a  diagnostic gastroscope and was able to advance the scope into the posterior bulb leading into the second portion of the duodenum.  There was a slit-like opening, which had somewhat of an elastic character and I was able to make it down into the duodenum and see the protruding stent.  Subsequently, I placed a TTS CRE balloon across the area of stenosis and inflated it to 12 mm for 1 minute and took it down.  This seemed to open up the lumen. Subsequently, I reintroduced the side-viewing scope into the distal stomach, but again, could not advance the side-viewing scope into the second portion of the duodenum.  I removed the side-viewing scope. I Obtained a diagnostic gastroscope, went back and placed a graduated CRE balloon across the area of stenosis and inflated gradually to 15 mm, held it there for 1 minute and took it down.  This seemed to open up the lumen even more.  I withdrew the gastroscope, obtained the side-viewing scope and I was able to advance across this area without too much difficulty into the duodenum.  Scout film was taken.  The weathered indwelling biliary stent was grasped with a snare and removed with the side-viewing scope, it was removed intact, subsequently reintroduced the side-viewing scope down into the duodenum and pulled it back to the short position 55 cm from incisors.  Scout films were taken  using the Microvasive sphincterotome.  The ampulla was approached.  Prior sphincterotomy and balloon dilation appeared to have scarred down somewhat, almost immediately obtained a deep biliary cannulation with the wire and sphincterotome.  Injections were made.  There were multiple distal filling defects and couple of cholesterol stones were delivered through the ampullary orifice with manipulation.  I saw where the sphincterotomy needed to be extended, utilizing the safety wire, under fluoroscopic control, the sphincterotome was pulled back across the ampullary orifice  at the 12 o'clock position. Approximately 9-10 mm sphincterotomy was made without difficulty or apparent complication.  There was no bleeding with this maneuver.  A few more small 2-3 mm stones were delivered through the ampullary orifice as manipulations were made.  I elected to go ahead and balloon up the sphincterotomy further as I did not feel I could extend the sphincterotomy safely.  This was done with a graduated balloon.  I inflated it to 12 mm for 1 minute and took it down, then went to 13.5 mm for 1 minute and took it down.  This was done without apparent complications.  There was minimal bleeding with this maneuver. Additional small "pebble" like stones were delivered with this maneuver. Subsequently, the safety wire came out with the sphincterotome.  I placed a graduated stone extractor balloon to the confluence under fluoroscopic control and inflated it to accommodate the size of the bile duct, which appeared to be mildly dilated but less so than seen previously.  I subsequently performed dredging maneuver with occlusion cholangiogram 4 times with recovery of multiple stones.  There was one nearly 1 cm stone recovered with this maneuver.  The last two passes yielded no further stone debris and the biliary tree appeared to drain extremely well with this maneuver.  The pancreatic duct was not manipulated or injected.  Finally, I elected to go ahead and biopsy the stenotic pyloric channel/posterior bulb / duodenal mucosa, all of this appeared to be benign peptic process.  I took two cold biopsies through the side-viewing scope.  The patient tolerated the procedure overall very well and was taken to PACU in stable condition.  IMPRESSION: 1. Pyloric channel stenosis, status post balloon dilation and     subsequent biopsy as described above. 2. Persisting common bile duct stones with extension of sphincterotomy     and sphincterotomy balloon dilation and stone extraction.   Biliary     stent removal also performed. 3. Good drainage of the biliary tree seen after the above     interventions.  RECOMMENDATIONS: 1. Follow up on pathology. 2. Recheck LFTs the first of next week. 3. Standard post ERCP instructions given. 4. Follow up on pathology. 5. Office followup in 4-6 weeks to discuss proceeding with his first ever screening colonoscopy 5. Daily acid suppression therapy in the way of proton pump inhibitor.  Further recommendations to follow.     Jonathon Bellows, MD Cale[facility] Essex     RMR/MEDQ  D:  10/18/2012  T:  10/18/2012  Job:  [id number]  cc:   Wyvonnia Lora, MD Fax: [phone]

## 2012-10-18 NOTE — Interval H&P Note (Signed)
History and Physical Interval Note:  10/18/2012 8:47 AM  Edward Trevino  has presented today for surgery, with the diagnosis of ANEMIA, ELEVATED LFTS GALLSTONES BILIARY STENT  The various methods of treatment have been discussed with the patient and family. After consideration of risks, benefits and other options for treatment, the patient has consented to  Procedure(s) with comments: ESOPHAGOGASTRODUODENOSCOPY (EGD) WITH PROPOFOL (N/A) - 9:30 ENDOSCOPIC RETROGRADE CHOLANGIOPANCREATOGRAPHY (ERCP) (N/A) STENT REMOVAL (N/A) as a surgical intervention .  The patient's history has been reviewed, patient examined, no change in status, stable for surgery.  I have reviewed the patient's chart and labs.  Questions were answered to the patient's satisfaction.     Edward Trevino  Patient seen and examined. Interviewed with family. He's had no of nominal pain. His LFTs are normal. ERCP with stent removal and possible stone extraction and further sphincterotomy balloon dilation may be performed, depending on what is seen with the side viewing scope.  We may do an EGD as well.The risks, benefits, limitations, alternatives, and mponderable have been reviewed with the patient. I specifically discussed a1 in 10 chance of pancreatitis, reaction to medications, bleeding, perforation and the possibility of a failed ERCP. Potential for sphincterotomy and stent placement also reviewed. Questions have been answered. All parties agreeable.

## 2012-10-18 NOTE — H&P (View-Only) (Signed)
Primary Care Physician:  Louie Boston, MD  Primary Gastroenterologist:  Roetta Sessions, MD   Chief Complaint  Patient presents with  . Follow-up    HPI:  Edward Trevino is a 79 y.o. male here for biliary stent removal. He had ERCP with biliary sphincterotomy, ampullary balloon dilation, balloon stone extraction, biliary stent placement along with EGD and duodenal biopsy on 07/24/2012 when patient presented with obstructive jaundice. Also noted to have severe erosive esophagitis, abnormalities of the bulb and proximal second portion of the duodenum resulting in a partial gastric outlet obstruction. Biopsy showed benign ulcer. H. pylori serologies were negative. CT had shown intra-and extrahepatic biliary ductal dilation. Infiltrative portal hepatus soft tissue inseparable from the common bile duct and gallbladder.  Patient states he's been doing reasonably well. His appetite is okay. He eats when he feels hungry. Denies any weight loss.  No abdominal pain. No heartburn. BM regular. No melena, brbpr. No prior colonoscopy. No dysphagia. Denies etoh use. Bottle of wine every six months. There is report of alcohol abuse in his chart (per son's report) but he denies any history of significant alcohol use. He also tells me he does not want to have his gallbladder out less he has to.   Current Outpatient Prescriptions  Medication Sig Dispense Refill  . acetaminophen (TYLENOL) 500 MG tablet Take 1,000 mg by mouth daily as needed. For pain       . clonazePAM (KLONOPIN) 0.5 MG tablet Take 1 tablet (0.5 mg total) by mouth 3 (three) times daily. Takes one tablet twice daily and 2 tablets at bedtime.  90 tablet  0  . mirtazapine (REMERON) 30 MG tablet Take 1 tablet (30 mg total) by mouth at bedtime.  30 tablet  0  . Multiple Vitamin (MULTIVITAMIN WITH MINERALS) TABS Take 1 tablet by mouth daily.      . pantoprazole (PROTONIX) 40 MG tablet Take 1 tablet (40 mg total) by mouth 2 (two) times daily.  60 tablet  3   . QUEtiapine (SEROQUEL) 100 MG tablet Take 1 tablet (100 mg total) by mouth 3 (three) times daily.  90 tablet  0  . venlafaxine XR (EFFEXOR-XR) 75 MG 24 hr capsule Take 1 capsule (75 mg total) by mouth daily.  30 capsule  0   No current facility-administered medications for this visit.    Allergies as of 10/01/2012  . (No Known Allergies)    Past Medical History  Diagnosis Date  . Depression   . Insomnia   . Anxiety   . Suicide attempt   . Erosive esophagitis 07/25/2012  . Gastric out let obstruction 07/25/2012    Past Surgical History  Procedure Laterality Date  . Appendectomy    . Back surgery    . Ercp N/A 07/24/2012    Dr. Jena Gauss. Significant abnormalities of the bowl and proximal second portion producing partial gastric outlet stricture and with secondary gastric dilation and severe erosive reflux esophagitis. Biopsy showed benign ulceration. Normal-appearing ampulla, status post biliary sphincterotomy and ampulla balloon dilation, stone extraction and stent placement.  Marland Kitchen Sphincterotomy N/A 07/24/2012    Procedure: SPHINCTEROTOMY;  Surgeon: Corbin Ade, MD;  Location: [facility];  Service: Endoscopy;  Laterality: N/A;  . Esophageal biopsy N/A 07/24/2012    Procedure: BIOPSY;  Surgeon: Corbin Ade, MD;  Location: [facility];  Service: Endoscopy;  Laterality: N/A;  Duodenal and Esophageal Biopsies  . Esophagogastroduodenoscopy N/A 07/24/2012    [id number] ampulla s/p biliary sphincterotomy and ampullary  . Balloon  dilation N/A 07/24/2012    Procedure: BALLOON DILATION;  Surgeon: Corbin Ade, MD;  Location: [facility];  Service: Endoscopy;  Laterality: N/A;  Balloon Stone Extraction, Drudging  . Removal of stones N/A 07/24/2012    Procedure: REMOVAL OF STONES;  Surgeon: Corbin Ade, MD;  Location: [facility];  Service: Endoscopy;  Laterality: N/A;    Family History  Problem Relation Age of Onset  . Colon cancer Neg Hx     History   Social History  . Marital Status:  Single    Spouse Name: N/A    Number of Children: N/A  . Years of Education: N/A   Occupational History  . retired    Social History Main Topics  . Smoking status: Current Every Day Smoker    Types: Cigars  . Smokeless tobacco: Not on file  . Alcohol Use: No     Comment: reported from ED son stated he has been drinking a lot of wine recently  . Drug Use: No  . Sexually Active: No   Other Topics Concern  . Not on file   Social History Narrative  . No narrative on file      ROS:  General: Negative for anorexia, weight loss, fever, chills, fatigue, weakness. Eyes: Negative for vision changes.  ENT: Negative for hoarseness, difficulty swallowing , nasal congestion. CV: Negative for chest pain, angina, palpitations, dyspnea on exertion, peripheral edema.  Respiratory: Negative for dyspnea at rest, dyspnea on exertion, cough, sputum, wheezing.  GI: See history of present illness. GU:  Negative for dysuria, hematuria, urinary incontinence, urinary frequency, nocturnal urination.  MS: Negative for joint pain, low back pain.  Derm: Negative for rash or itching.  Neuro: Negative for weakness, abnormal sensation, seizure, frequent headaches, memory loss, confusion.  Psych: Negative for anxiety, depression, suicidal ideation, hallucinations.  Endo: Negative for unusual weight change.  Heme: Negative for bruising or bleeding. Allergy: Negative for rash or hives.    Physical Examination:  BP 96/60  Pulse 107  Temp(Src) 97.4 F (36.3 C) (Oral)  Ht 6\' 3"  (1.905 m)  Wt 184 lb 3.2 oz (83.553 kg)  BMI 23.02 kg/m2   General: Elderly, somewhat disheveled appearing white male in no acute distress. Accompanied by friend.  Head: Normocephalic, atraumatic.   Eyes: Conjunctiva pink, no icterus. Mouth: Oropharyngeal mucosa moist and pink , no lesions erythema or exudate. Neck: Supple without thyromegaly, masses, or lymphadenopathy.  Lungs: Clear to auscultation bilaterally.  Heart:  Regular rate and rhythm, no murmurs rubs or gallops.  Abdomen: Bowel sounds are normal, nontender, nondistended, no hepatosplenomegaly or masses, no abdominal bruits or    hernia , no rebound or guarding.   Rectal: Not performed Extremities: No lower extremity edema. No clubbing or deformities.  Neuro: Alert and oriented x 4 , grossly normal neurologically.  Skin: Warm and dry, no rash or jaundice.   Psych: Alert and cooperative, normal mood and affect.  Labs: Lab Results  Component Value Date   CREATININE 1.09 07/26/2012   BUN 8 07/26/2012   NA 141 07/26/2012   K 3.8 07/26/2012   CL 105 07/26/2012   CO2 27 07/26/2012   Lab Results  Component Value Date   ALT 163* 07/26/2012   AST 132* 07/26/2012   ALKPHOS 330* 07/26/2012   BILITOT 2.4* 07/26/2012   Lab Results  Component Value Date   WBC 6.1 07/25/2012   HGB 11.0* 07/25/2012   HCT 32.1* 07/25/2012   MCV 96.1 07/25/2012   PLT  205 07/25/2012     Imaging Studies: No results found.

## 2012-10-18 NOTE — Transfer of Care (Signed)
Immediate Anesthesia Transfer of Care Note  Patient: Edward Trevino  Procedure(s) Performed: Procedure(s): ESOPHAGOGASTRODUODENOSCOPY (EGD) WITH PROPOFOL (N/A) ENDOSCOPIC RETROGRADE CHOLANGIOPANCREATOGRAPHY (ERCP) (N/A) STENT REMOVAL (N/A) REMOVAL OF STONES (N/A) BIOPSY (N/A)  Patient Location: PACU  Anesthesia Type:General  Level of Consciousness: awake, alert  and oriented  Airway & Oxygen Therapy: Patient Spontanous Breathing and Patient connected to face mask oxygen  Post-op Assessment: Report given to PACU RN  Post vital signs: Reviewed and stable  Complications: No apparent anesthesia complications

## 2012-10-19 ENCOUNTER — Emergency Department (HOSPITAL_COMMUNITY): Payer: Medicare Other

## 2012-10-19 ENCOUNTER — Encounter: Payer: Self-pay | Admitting: Internal Medicine

## 2012-10-19 ENCOUNTER — Encounter (HOSPITAL_COMMUNITY): Payer: Self-pay | Admitting: *Deleted

## 2012-10-19 ENCOUNTER — Observation Stay (HOSPITAL_COMMUNITY)
Admission: EM | Admit: 2012-10-19 | Discharge: 2012-10-20 | DRG: 392 | Disposition: A | Discharge status: Home / Self Care | Payer: Medicare Other | Attending: Internal Medicine | Admitting: Internal Medicine

## 2012-10-19 DIAGNOSIS — K311 Adult hypertrophic pyloric stenosis: Secondary | ICD-10-CM

## 2012-10-19 DIAGNOSIS — D649 Anemia, unspecified: Secondary | ICD-10-CM

## 2012-10-19 DIAGNOSIS — Z9889 Other specified postprocedural states: Secondary | ICD-10-CM

## 2012-10-19 DIAGNOSIS — R1110 Vomiting, unspecified: Secondary | ICD-10-CM

## 2012-10-19 DIAGNOSIS — R109 Unspecified abdominal pain: Principal | ICD-10-CM

## 2012-10-19 DIAGNOSIS — F329 Major depressive disorder, single episode, unspecified: Secondary | ICD-10-CM | POA: Diagnosis present

## 2012-10-19 DIAGNOSIS — R110 Nausea: Secondary | ICD-10-CM

## 2012-10-19 DIAGNOSIS — F32A Depression, unspecified: Secondary | ICD-10-CM | POA: Diagnosis present

## 2012-10-19 DIAGNOSIS — F419 Anxiety disorder, unspecified: Secondary | ICD-10-CM

## 2012-10-19 DIAGNOSIS — R1011 Right upper quadrant pain: Secondary | ICD-10-CM

## 2012-10-19 LAB — COMPREHENSIVE METABOLIC PANEL
ALT: 24 U/L (ref 0–53)
Albumin: 4 g/dL (ref 3.5–5.2)
BUN: 11 mg/dL (ref 6–23)
Calcium: 9.4 mg/dL (ref 8.4–10.5)
Chloride: 102 mEq/L (ref 96–112)
Creatinine, Ser: 1.24 mg/dL (ref 0.50–1.35)
GFR calc non Af Amer: 54 mL/min — ABNORMAL LOW (ref >90)
Potassium: 3.9 mEq/L (ref 3.5–5.1)
Sodium: 142 mEq/L (ref 135–145)
Total Bilirubin: 0.4 mg/dL (ref 0.3–1.2)
Total Protein: 7.3 g/dL (ref 6.0–8.3)

## 2012-10-19 LAB — URINALYSIS, ROUTINE W REFLEX MICROSCOPIC
Ketones, ur: NEGATIVE mg/dL
Leukocytes, UA: NEGATIVE
Nitrite: NEGATIVE
Protein, ur: NEGATIVE mg/dL

## 2012-10-19 LAB — CBC WITH DIFFERENTIAL/PLATELET
Basophils Relative: 0 % (ref 0–1)
Eosinophils Absolute: 0 10*3/uL (ref 0.0–0.7)
Eosinophils Relative: 0 % (ref 0–5)
HCT: 39.5 % (ref 39.0–52.0)
Hemoglobin: 13.7 g/dL (ref 13.0–17.0)
MCH: 33.1 pg (ref 26.0–34.0)
MCHC: 34.7 g/dL (ref 30.0–36.0)
MCV: 95.4 fL (ref 78.0–100.0)
Monocytes Absolute: 0.8 10*3/uL (ref 0.1–1.0)
Monocytes Relative: 8 % (ref 3–12)
Neutro Abs: 7.9 10*3/uL — ABNORMAL HIGH (ref 1.7–7.7)
Neutrophils Relative %: 80 % — ABNORMAL HIGH (ref 43–77)
RDW: 12.2 % (ref 11.5–15.5)

## 2012-10-19 LAB — LIPASE, BLOOD: Lipase: 33 U/L (ref 11–59)

## 2012-10-19 MED ORDER — MORPHINE SULFATE 4 MG/ML IJ SOLN: 4.0000 mg | Freq: Once | INTRAMUSCULAR | Status: AC

## 2012-10-19 MED ORDER — ONDANSETRON HCL 4 MG PO TABS
4.0000 mg | ORAL_TABLET | ORAL | Status: DC
  Administered 2012-10-19 – 2012-10-20 (×4): 4 mg via ORAL
  Filled 2012-10-19 (×3): qty 1

## 2012-10-19 MED ORDER — IOHEXOL 300 MG/ML  SOLN
50.0000 mL | Freq: Once | INTRAMUSCULAR | Status: AC | PRN
  Administered 2012-10-19: 50 mL via ORAL

## 2012-10-19 MED ORDER — ACETAMINOPHEN 650 MG RE SUPP: 650.0000 mg | Freq: Four times a day (QID) | RECTAL | Status: DC | PRN

## 2012-10-19 MED ORDER — SODIUM CHLORIDE 0.9 % IV SOLN: INTRAVENOUS | Status: DC

## 2012-10-19 MED ORDER — ONDANSETRON HCL 4 MG/2ML IJ SOLN: 4.0000 mg | Freq: Three times a day (TID) | INTRAMUSCULAR | Status: DC | PRN

## 2012-10-19 MED ORDER — SODIUM CHLORIDE 0.9 % IV SOLN
1000.0000 mL | Freq: Once | INTRAVENOUS | Status: AC
  Administered 2012-10-19: 1000 mL via INTRAVENOUS

## 2012-10-19 MED ORDER — MORPHINE SULFATE 4 MG/ML IJ SOLN
4.0000 mg | Freq: Once | INTRAMUSCULAR | Status: AC
  Administered 2012-10-19: 4 mg via INTRAVENOUS
  Filled 2012-10-19: qty 1

## 2012-10-19 MED ORDER — ONDANSETRON HCL 4 MG/2ML IJ SOLN
4.0000 mg | Freq: Once | INTRAMUSCULAR | Status: AC
  Administered 2012-10-19: 4 mg via INTRAVENOUS
  Filled 2012-10-19: qty 2

## 2012-10-19 MED ORDER — ONDANSETRON HCL 4 MG/2ML IJ SOLN
4.0000 mg | INTRAMUSCULAR | Status: DC
  Administered 2012-10-19 – 2012-10-20 (×2): 4 mg via INTRAVENOUS
  Filled 2012-10-19 (×3): qty 2

## 2012-10-19 MED ORDER — IOHEXOL 300 MG/ML  SOLN
100.0000 mL | Freq: Once | INTRAMUSCULAR | Status: AC | PRN
  Administered 2012-10-19: 100 mL via INTRAVENOUS

## 2012-10-19 MED ORDER — MORPHINE SULFATE 4 MG/ML IJ SOLN
INTRAMUSCULAR | Status: AC
  Administered 2012-10-19: 4 mg via INTRAVENOUS
  Filled 2012-10-19: qty 1

## 2012-10-19 MED ORDER — CLONAZEPAM 0.5 MG PO TABS
0.5000 mg | ORAL_TABLET | Freq: Three times a day (TID) | ORAL | Status: DC
Start: 2012-10-19 — End: 2012-10-20
  Administered 2012-10-19 – 2012-10-20 (×4): 0.5 mg via ORAL
  Filled 2012-10-19 (×4): qty 1

## 2012-10-19 MED ORDER — ACETAMINOPHEN 325 MG PO TABS: 650.0000 mg | ORAL_TABLET | Freq: Four times a day (QID) | ORAL | Status: DC | PRN

## 2012-10-19 MED ORDER — SODIUM CHLORIDE 0.9 % IV SOLN
INTRAVENOUS | Status: DC
  Administered 2012-10-19 – 2012-10-20 (×3): via INTRAVENOUS

## 2012-10-19 MED ORDER — QUETIAPINE FUMARATE 100 MG PO TABS
100.0000 mg | ORAL_TABLET | Freq: Three times a day (TID) | ORAL | Status: DC
  Administered 2012-10-19 – 2012-10-20 (×4): 100 mg via ORAL
  Filled 2012-10-19 (×4): qty 1

## 2012-10-19 MED ORDER — SODIUM CHLORIDE 0.9 % IV SOLN
1000.0000 mL | INTRAVENOUS | Status: DC
  Administered 2012-10-19: 1000 mL via INTRAVENOUS

## 2012-10-19 MED ORDER — ALUM & MAG HYDROXIDE-SIMETH 200-200-20 MG/5ML PO SUSP: 30.0000 mL | Freq: Four times a day (QID) | ORAL | Status: DC | PRN

## 2012-10-19 MED ORDER — ENOXAPARIN SODIUM 40 MG/0.4ML ~~LOC~~ SOLN
40.0000 mg | SUBCUTANEOUS | Status: DC
  Administered 2012-10-19 – 2012-10-20 (×2): 40 mg via SUBCUTANEOUS
  Filled 2012-10-19 (×2): qty 0.4

## 2012-10-19 MED ORDER — MORPHINE SULFATE 4 MG/ML IJ SOLN: 4.0000 mg | INTRAMUSCULAR | Status: DC | PRN

## 2012-10-19 MED ORDER — ONDANSETRON HCL 4 MG/2ML IJ SOLN
INTRAMUSCULAR | Status: AC
  Administered 2012-10-19: 4 mg via INTRAVENOUS
  Filled 2012-10-19: qty 2

## 2012-10-19 MED ORDER — ZOLPIDEM TARTRATE 5 MG PO TABS
5.0000 mg | ORAL_TABLET | Freq: Every evening | ORAL | Status: DC | PRN
  Administered 2012-10-19: 5 mg via ORAL
  Filled 2012-10-19: qty 1

## 2012-10-19 MED ORDER — ONDANSETRON HCL 4 MG/2ML IJ SOLN: 4.0000 mg | Freq: Once | INTRAMUSCULAR | Status: AC

## 2012-10-19 MED ORDER — HYDROMORPHONE HCL PF 1 MG/ML IJ SOLN
0.5000 mg | INTRAMUSCULAR | Status: DC | PRN
  Administered 2012-10-19 (×2): 0.5 mg via INTRAVENOUS
  Filled 2012-10-19 (×2): qty 1

## 2012-10-19 NOTE — H&P (Signed)
[facility] History and Physical  ALEXUS MICHAEL ZOX:[id number] DOB: [birth year] DOA: 10/19/2012  Referring physician:  PCP: Louie Boston, MD  Specialists:   Chief Complaint: Intractable nausea and abdominal pain  HPI: Edward Trevino is a 79 y.o. male with past medical history that includes depression anxiety gastric outlet obstruction presents to the emergency department today with chief complaint of abdominal pain and nausea. Information is obtained from the patient. Patient is status post EGD yesterday with pyloric channel balloon dilation cholangiogram with extension of prior biliary sphincterotomy, balloon dilation at sphincterotomy site, stone extraction and stent removal. He reports that he did well yesterday until about 8 PM in the evening when he developed diffuse abdominal pain and persistent nausea denies any vomiting. He has had nothing to eat or drink since last evening. He reports that the pain is throughout his entire abdomen it is constant nothing makes it better or worse. He denies any emesis or coffee ground emesis. He denies a sensation of bloating. He reports having 1 episode of diarrhea and has not had a BM since yesterday evening. He denies any flatulence. He denies fever chills chest pain shortness of breath headache. Workup in the emergency room yielded normal LFTs, lipase, white blood cell count, hemoglobin. Acute abdominal series with no acute findings. Abdominal ultrasound showed improved intrahepatic biliary dilation. CT of the abdomen and pelvis with contrast showed mild haziness in the porta hepatis and common bile duct region felt to be reflective of recent ERCP yesterday. There was no free air . Patient seen by GI in the emergency room who recommended an overnight admission.   Review of Systems: 14 point review of systems all negative except as dictated in the history of present illness  Past Medical History  Diagnosis Date  . Depression   . Insomnia   . Anxiety    . Suicide attempt   . Erosive esophagitis 07/25/2012  . Gastric out let obstruction 07/25/2012  . HOH (hard of hearing)    Past Surgical History  Procedure Laterality Date  . Appendectomy    . Back surgery    . Ercp N/A 07/24/2012    Dr. Jena Gauss. Significant abnormalities of the bowl and proximal second portion producing partial gastric outlet stricture and with secondary gastric dilation and severe erosive reflux esophagitis. Biopsy showed benign ulceration. Normal-appearing ampulla, status post biliary sphincterotomy and ampulla balloon dilation, stone extraction and stent placement.  Marland Kitchen Sphincterotomy N/A 07/24/2012    Procedure: SPHINCTEROTOMY;  Surgeon: Corbin Ade, MD;  Location: [facility];  Service: Endoscopy;  Laterality: N/A;  . Esophageal biopsy N/A 07/24/2012    Procedure: BIOPSY;  Surgeon: Corbin Ade, MD;  Location: [facility];  Service: Endoscopy;  Laterality: N/A;  Duodenal and Esophageal Biopsies  . Esophagogastroduodenoscopy N/A 07/24/2012    [id number] ampulla s/p biliary sphincterotomy and ampullary  . Balloon dilation N/A 07/24/2012    Procedure: BALLOON DILATION;  Surgeon: Corbin Ade, MD;  Location: [facility];  Service: Endoscopy;  Laterality: N/A;  Balloon Stone Extraction, Drudging  . Removal of stones N/A 07/24/2012    Procedure: REMOVAL OF STONES;  Surgeon: Corbin Ade, MD;  Location: [facility];  Service: Endoscopy;  Laterality: N/A;  . Egd/ercp  10/18/2012    Dr. Jena Gauss: ;yloric channel stenosis, s/p dilation and bx, persisting CBD stones with extension of sphincterotomy and sphincterotomy balloon dilation and stone extraction. Removal of biliary stent  . Esophagogastroduodenoscopy (egd) with propofol N/A 10/18/2012    Procedure: ESOPHAGOGASTRODUODENOSCOPY (EGD)  WITH PROPOFOL;  Surgeon: Corbin Ade, MD;  Location: [facility];  Service: Endoscopy;  Laterality: N/A;  . Ercp N/A 10/18/2012    Procedure: ENDOSCOPIC RETROGRADE CHOLANGIOPANCREATOGRAPHY (ERCP);  Surgeon: Corbin Ade, MD;  Location: [facility];  Service: Endoscopy;  Laterality: N/A;  . Removal of stones N/A 10/18/2012    Procedure: REMOVAL OF STONES;  Surgeon: Corbin Ade, MD;  Location: [facility];  Service: Endoscopy;  Laterality: N/A;  . Esophageal biopsy N/A 10/18/2012    Procedure: BIOPSY;  Surgeon: Corbin Ade, MD;  Location: [facility];  Service: Endoscopy;  Laterality: N/A;   Social History:  reports that he has been smoking Cigars.  He does not have any smokeless tobacco history on file. He reports that he does not drink alcohol or use illicit drugs. Patient lives alone he denies smoking he denies EtOH use. He is independent with activities of daily living  No Known Allergies  Family History  Problem Relation Age of Onset  . Colon cancer Neg Hx    both parents are deceased patient is on sure of their past medical history Prior to Admission medications   Medication Sig Start Date End Date Taking? Authorizing Provider  clonazePAM (KLONOPIN) 0.5 MG tablet Take 1 tablet (0.5 mg total) by mouth 3 (three) times daily. Takes one tablet twice daily and 2 tablets at bedtime. 07/26/12  Yes Elliot Cousin, MD  mirtazapine (REMERON) 30 MG tablet Take 1 tablet (30 mg total) by mouth at bedtime. 07/26/12  Yes Elliot Cousin, MD  Multiple Vitamin (MULTIVITAMIN WITH MINERALS) TABS Take 1 tablet by mouth daily. 07/26/12  Yes Elliot Cousin, MD  QUEtiapine (SEROQUEL) 100 MG tablet Take 1 tablet (100 mg total) by mouth 3 (three) times daily. 07/26/12  Yes Elliot Cousin, MD  venlafaxine XR (EFFEXOR-XR) 75 MG 24 hr capsule Take 1 capsule (75 mg total) by mouth daily. 07/26/12  Yes Elliot Cousin, MD  vitamin C (ASCORBIC ACID) 500 MG tablet Take 500 mg by mouth daily.   Yes Historical Provider, MD   Physical Exam: Filed Vitals:   10/19/12 0826 10/19/12 1211 10/19/12 1327  BP: 139/75 128/80 159/78  Pulse: 89 67 68  Temp: 98.9 F (37.2 C)    TempSrc: Oral    Resp: 20 18 18   Height: 6\' 3"  (1.905 m)    Weight: 83.462 kg  (184 lb)    SpO2: 99% 98% 98%     General:  Well-nourished somewhat unkept no acute distress  Eyes: PERRL, EOMI , no scleral icterus  ENT: Ears clear nose without drainage oropharynx pink very dry no erythema or exudate  Neck: Supple, no lymphadenopathy  Cardiovascular: Regular rate and rhythm, no murmur gallop or rub no lower extremity edema  Respiratory: Normal effort breath sounds clear bilaterally no wheeze no rhonchi  Abdomen: Round only slightly firm positive bowel sounds mild tenderness diffusely to palpation no mass organomegaly noted  Skin: Warm and dry no rash or lesion  Musculoskeletal: No clubbing or cyanosis  Psychiatric: Calm cooperative  Neurologic: Cranial nerves II through XII grossly intact speech clear  Labs on Admission:  Basic Metabolic Panel:  Recent Labs Lab 10/15/12 1410 10/19/12 0842  NA 140 142  K 3.9 3.9  CL 101 102  CO2 28 32  GLUCOSE 144* 127*  BUN 9 11  CREATININE 1.11 1.24  CALCIUM 9.2 9.4   Liver Function Tests:  Recent Labs Lab 10/19/12 0842  AST 26  ALT 24  ALKPHOS 117  BILITOT  0.4  PROT 7.3  ALBUMIN 4.0    Recent Labs Lab 10/19/12 0842  LIPASE 33   No results found for this basename: AMMONIA,  in the last 168 hours CBC:  Recent Labs Lab 10/15/12 1410 10/19/12 0842  WBC  --  9.9  NEUTROABS  --  7.9*  HGB 13.8 13.7  HCT 38.8* 39.5  MCV  --  95.4  PLT  --  206   Cardiac Enzymes: No results found for this basename: CKTOTAL, CKMB, CKMBINDEX, TROPONINI,  in the last 168 hours  BNP (last 3 results) No results found for this basename: PROBNP,  in the last 8760 hours CBG: No results found for this basename: GLUCAP,  in the last 168 hours  Radiological Exams on Admission: Ct Abdomen Pelvis W Contrast  10/19/2012   **ADDENDUM** CREATED: 10/19/2012 15:13:52  The patient has apparently not had a cholecystectomy despite the history provided.  The gallbladder is not visualized on the ultrasound examination.  **END  ADDENDUM** SIGNED BY: Cyndie Chime, M.D.  10/19/2012   **ADDENDUM** CREATED: 10/19/2012 15:03:34  Despite a previous report of recent cholecystectomy, provided on sonographic examination of 10/19/2012, the patient adamantly denies such history.  On the current examination, gallbladder is markedly decompressed.  **END ADDENDUM** SIGNED BY: Reyes Ivan, M.D.  10/19/2012   *RADIOLOGY REPORT*  Clinical Data: Years CT yesterday with removal of biliary stent. Mild abdominal pain and vomiting.  CT ABDOMEN AND PELVIS WITH CONTRAST  Technique:  Multidetector CT imaging of the abdomen and pelvis was performed following the standard protocol during bolus administration of intravenous contrast.  Contrast: 50mL OMNIPAQUE IOHEXOL 300 MG/ML  SOLN, OMNIPAQUE IOHEXOL 300 MG/ML  SOLN  Comparison: 07/23/2012.  Findings: Lung bases show scattered scarring and calcified granulomas. Trace left pleural fluid.  Heart size normal.  No pericardial effusion.  There is apparent thickening of the lower esophageal wall which can be seen with gastroesophageal reflux disease.  Liver is unremarkable.  Interval cholecystectomy.  Haziness about the porta hepatis and extrahepatic bile duct likely relate to recent intervention performed yesterday with removal of the biliary stent.  Extrahepatic bile duct does not appear dilated.  Adrenal glands are unremarkable.  There may be a sub centimeter low attenuation lesion in the lower pole right kidney, too small to characterize.  Kidneys, spleen, pancreas, stomach and bowel are otherwise unremarkable.  Small bilateral inguinal hernias contain fat.  Bladder is distended with urine.  Prostate is enlarged.  No pathologically enlarged lymph nodes.  Atherosclerotic calcification of the arterial vasculature without abdominal aortic aneurysm.  No extraluminal contrast or air.  No worrisome lytic or sclerotic lesions.  IMPRESSION:  1.  Mild haziness in the porta hepatis and common bile duct region is  likely reflective of recent ERCP and stent removal performed yesterday.  No evidence of free air. 2.  Trace left pleural fluid. 3.  Small bilateral inguinal hernias contain fat. 4.  Prostate enlargement.   Original Report Authenticated By: Leanna Battles, M.D.   Dg Ercp Biliary & Pancreatic Ducts  10/18/2012   *RADIOLOGY REPORT*  Clinical Data: Choledocholithiasis  ERCP  Comparison:  Abdominal ultrasound 07/25/2012  Technique:  Multiple spot images obtained with the fluoroscopic device and submitted for interpretation post-procedure.  ERCP was performed by Dr.  [name].  Findings: Multiple intraoperative spot images depict an endoscope in the duodenum and cannulation of the common bile duct.  A past retrograde opacification of the extra and intrahepatic bile duct demonstrates several filling  defects in the common bile that consistent with choledocholithiasis.  The images then demonstrate balloon sweeping of the distal common duct.  The plastic biliary sphincter was also removed during the course of the procedure.  IMPRESSION: ERCP, plastic stent removal, sphincterotomy and balloon sweep of the common bile duct as above.  These images were submitted for radiologic interpretation only. Please see the procedural report for the amount of contrast and the fluoroscopy time utilized.   Original Report Authenticated By: Malachy Moan, M.D.   Dg Abd Acute W/chest  10/19/2012   *RADIOLOGY REPORT*  Clinical Data: Abdominal pain, bloating and nausea.  ACUTE ABDOMEN SERIES (ABDOMEN 2 VIEW & CHEST 1 VIEW)  Comparison: Chest x-ray on 07/22/2012 and ERCP procedure yesterday.  Findings: The lungs show evidence of stable chronic disease.  No acute edema or infiltrate is identified.  Heart size is normal.  Abdominal films show no evidence of bowel obstruction, significant ileus or free intraperitoneal air.  There is some loss of contrast in the colon likely related to the contrast injected at the time of ERCP yesterday.   No abnormal calcifications are seen.  Degenerative changes are present in the lower lumbar spine.  IMPRESSION: No acute findings.  Stable chronic lung disease.   Original Report Authenticated By: Irish Lack, M.D.   US Abdomen Limited Ruq  10/19/2012   *RADIOLOGY REPORT*  Clinical Data:  History of cholecystectomy 8 weeks ago.  Removal of common bile duct stent.  LIMITED ABDOMINAL ULTRASOUND - RIGHT UPPER QUADRANT  Comparison:  07/25/2012.  Findings:  Gallbladder:  Surgically absent.  Common bile duct:  Normal in caliber measuring a maximum of 4.4mm.  Liver:  Mild diffuse fatty infiltration.  The intrahepatic biliary dilatation is much improved.  A 7 mm echogenic lesion in the right lobe is most likely a small hemangioma.  IMPRESSION:  1.  Status post cholecystectomy and common bile duct stent removal. 2.  No common bile duct dilatation and improved intrahepatic biliary dilatation. 3.  Mild diffuse fatty infiltration of the liver and probable small, 7 mm, right hepatic lobe hemangioma.                    Original Report Authenticated By: Rudie Meyer, M.D.    EKG: Independently reviewed.   Assessment/Plan Principal Problem:   Abdominal pain: Etiology uncertain. Concern for post ERCP pancreatitis. Also concerned 4 evolving acute cholecystitis. Will admit to medical floor. Will provide clear liquid trial per GI. Will provide antimanic and pain meds as needed. Will request a non-urgent surgical consult. Active Problems:    Gastric out let obstruction: Patient with history of biliary sphincterotomy balloon stone extraction, biliary stent placement along with EGD and duodenal biopsy back in March 2014. He had severe erosive esophagitis, abnormalities of the bulb and proximal second portion of the duodenum resulting in partial gastric outlet obstruction at that time. Biopsy showed benign ulcer. H. pylori serologies were negative. See #1. Will monitor close       Nausea: Intractable. See #1. Will offer  anti-emetic on an as-needed basis. If no improvement will consider scheduled Zofran  Depression. Appears to be at baseline. Will continue Seroquel and Klonopin. Will hold Remeron and Effexor. Will monitor close   Gastroenterology Code Status: full Family Communication: friend at bedside Disposition Plan: home when ready  Time spent: 70 minutes  [facility]   If 7PM-7AM, please contact night-coverage [email or web address] Password [facility] 10/19/2012, 3:58 PM Attending: Patient seen and examined. Based on his  history, physical examination and labwork, he does not appear to have any significant acute biliary process. He is clinically dehydrated. I doubt that he has pancreatitis post ERCP. Agree with intravenous fluids for hydration. Agree with surgical consultation to give an opinion regarding his gallbladder. Appreciate gastroenterology input.

## 2012-10-19 NOTE — ED Notes (Signed)
Patient accidentally pulled IV out while attempting to stand to use urinal.  Patient cleaned up and nurse notified.

## 2012-10-19 NOTE — Consult Note (Signed)
Attending note:  Please see our complete consultation note for more information. Patient seen and examined in the ED.  Recent imaging reviewed with Dr. Fredirick Lathe and discussed with Drs. Lynelle Doctor and [place]; recurrent RUQ abdominal pain.  Biliary decompression yesterday with pyloric channel dilation.  No evidence of of any complication related to ERCP.  GB appears abnormal/contracted.  Previously, recommended cholecystectomy. Agree with admission and surgical consultation.  Addendum:   Duodenal biopsies from yesterday revealed only mild inflammation. No evidence for malignancy.

## 2012-10-19 NOTE — ED Provider Notes (Signed)
History  This chart was scribed for [name], MD by Jiles Prows, ED Scribe. The patient was seen in room APA06/APA06 and the patient's care was started at 8:25 AM.  CSN: [id number]  Arrival date & time 10/19/12  0813  Chief Complaint  Patient presents with  . Abdominal Pain   The history is provided by the patient, medical records and a friend. No language interpreter was used.   HPI Comments: Edward Trevino is a 79 y.o. male who presents to the Emergency Department complaining of moderate constant diffuse abdominal pain and nausea that began last night around bed time around 8 pm.  He reports that the pain kept him from sleeping last night.  He states that laying on his back exacerbates the pain, and laying on his side relieves the pain.  He reports diarrhea last night and constipation since.  He claims that he "wants to throw up," but is unable to do so. Pt reports he was seen here yesterday for removal of a bile stint.  His neighbor reports the stint was placed about 8 weeks ago after gallbladder surger, and pt had been feeling much better since the placement.  Today, however, both pt and neighbor report that pt is doing as poorly as he was before placement of stint.  Pt reports taking Pepto Bismol last night and this morning without relief.  Pt reports subjective fever last night (98.9 currently).  He states he is feeling weak upon standing today.  Pt denies headache, diaphoresis, fever, chills, cough, SOB and any other pain. He states his abdomen feels bloated.   Pt reports smoking 8-10 cigars a day since he was 17.  Pt does reports he does not drink.   PCP Dr Margo Common GI Dr Jena Gauss  Past Medical History  Diagnosis Date  . Depression   . Insomnia   . Anxiety   . Suicide attempt   . Erosive esophagitis 07/25/2012  . Gastric out let obstruction 07/25/2012  . HOH (hard of hearing)     Past Surgical History  Procedure Laterality Date  . Appendectomy    . Back surgery    . Ercp N/A  07/24/2012    Dr. Jena Gauss. Significant abnormalities of the bowl and proximal second portion producing partial gastric outlet stricture and with secondary gastric dilation and severe erosive reflux esophagitis. Biopsy showed benign ulceration. Normal-appearing ampulla, status post biliary sphincterotomy and ampulla balloon dilation, stone extraction and stent placement.  Marland Kitchen Sphincterotomy N/A 07/24/2012    Procedure: SPHINCTEROTOMY;  Surgeon: Corbin Ade, MD;  Location: [facility];  Service: Endoscopy;  Laterality: N/A;  . Esophageal biopsy N/A 07/24/2012    Procedure: BIOPSY;  Surgeon: Corbin Ade, MD;  Location: [facility];  Service: Endoscopy;  Laterality: N/A;  Duodenal and Esophageal Biopsies  . Esophagogastroduodenoscopy N/A 07/24/2012    VQQ:[id number] ampulla s/p biliary sphincterotomy and ampullary  . Balloon dilation N/A 07/24/2012    Procedure: BALLOON DILATION;  Surgeon: Corbin Ade, MD;  Location: [facility];  Service: Endoscopy;  Laterality: N/A;  Balloon Stone Extraction, Drudging  . Removal of stones N/A 07/24/2012    Procedure: REMOVAL OF STONES;  Surgeon: Corbin Ade, MD;  Location: [facility];  Service: Endoscopy;  Laterality: N/A;  . Egd/ercp  10/18/2012    Dr. Jena Gauss: ;yloric channel stenosis, s/p dilation and bx, persisting CBD stones with extension of sphincterotomy and sphincterotomy balloon dilation and stone extraction. Removal of biliary stent  . Esophagogastroduodenoscopy (egd) with propofol N/A  10/18/2012    Procedure: ESOPHAGOGASTRODUODENOSCOPY (EGD) WITH PROPOFOL;  Surgeon: Corbin Ade, MD;  Location: [facility];  Service: Endoscopy;  Laterality: N/A;  . Ercp N/A 10/18/2012    Procedure: ENDOSCOPIC RETROGRADE CHOLANGIOPANCREATOGRAPHY (ERCP);  Surgeon: Corbin Ade, MD;  Location: [facility];  Service: Endoscopy;  Laterality: N/A;  . Removal of stones N/A 10/18/2012    Procedure: REMOVAL OF STONES;  Surgeon: Corbin Ade, MD;  Location: [facility];  Service: Endoscopy;  Laterality: N/A;   . Esophageal biopsy N/A 10/18/2012    Procedure: BIOPSY;  Surgeon: Corbin Ade, MD;  Location: [facility];  Service: Endoscopy;  Laterality: N/A;    Family History  Problem Relation Age of Onset  . Colon cancer Neg Hx     History  Substance Use Topics  . Smoking status: Current Every Day Smoker    Types: Cigars  . Smokeless tobacco: Not on file  . Alcohol Use: No     Comment: reported from ED son stated he has been drinking a lot of wine recently  lives at home Lives alone   Review of Systems  Constitutional: Negative for fever and chills.  HENT: Negative for sore throat and sneezing.   Gastrointestinal: Positive for nausea, diarrhea and constipation. Negative for vomiting.  All other systems reviewed and are negative.   Allergies  Review of patient's allergies indicates no known allergies.  Home Medications   Current Outpatient Rx  Name  Route  Sig  Dispense  Refill  . clonazePAM (KLONOPIN) 0.5 MG tablet   Oral   Take 1 tablet (0.5 mg total) by mouth 3 (three) times daily. Takes one tablet twice daily and 2 tablets at bedtime.   90 tablet   0   . mirtazapine (REMERON) 30 MG tablet   Oral   Take 1 tablet (30 mg total) by mouth at bedtime.   30 tablet   0   . Multiple Vitamin (MULTIVITAMIN WITH MINERALS) TABS   Oral   Take 1 tablet by mouth daily.         . QUEtiapine (SEROQUEL) 100 MG tablet   Oral   Take 1 tablet (100 mg total) by mouth 3 (three) times daily.   90 tablet   0   . venlafaxine XR (EFFEXOR-XR) 75 MG 24 hr capsule   Oral   Take 1 capsule (75 mg total) by mouth daily.   30 capsule   0   . vitamin C (ASCORBIC ACID) 500 MG tablet   Oral   Take 500 mg by mouth daily.          Triage Vitals: BP 139/75  Pulse 89  Temp(Src) 98.9 F (37.2 C) (Oral)  Resp 20  Ht 6\' 3"  (1.905 m)  Wt 184 lb (83.462 kg)  BMI 23 kg/m2  SpO2 99%  Vital signs normal    Physical Exam  Nursing note and vitals reviewed. Constitutional: He is oriented to  person, place, and time. He appears well-developed and well-nourished.  Non-toxic appearance. He does not appear ill. No distress.  HENT:  Head: Normocephalic and atraumatic.  Right Ear: External ear normal.  Left Ear: External ear normal.  Nose: Nose normal. No mucosal edema or rhinorrhea.  Mouth/Throat: Oropharynx is clear and moist and mucous membranes are normal. No dental abscesses or edematous.  Eyes: Conjunctivae and EOM are normal. Pupils are equal, round, and reactive to light.  Neck: Normal range of motion and full passive range of motion without pain. Neck  supple.  Cardiovascular: Normal rate, regular rhythm and normal heart sounds.  Exam reveals no gallop and no friction rub.   No murmur heard. Pulmonary/Chest: Effort normal and breath sounds normal. No respiratory distress. He has no wheezes. He has no rhonchi. He has no rales. He exhibits no tenderness and no crepitus.  Abdominal: Soft. Normal appearance and bowel sounds are normal. He exhibits no distension. There is no tenderness. There is no rebound and no guarding.    Tenderness over right lateral abdomen.  Musculoskeletal: Normal range of motion. He exhibits no edema and no tenderness.  Moves all extremities well.   Neurological: He is alert and oriented to person, place, and time. He has normal strength. No cranial nerve deficit.  Skin: Skin is warm, dry and intact. No rash noted. No erythema. No pallor.  Nicotine stains on right index finger.  Psychiatric: He has a normal mood and affect. His speech is normal and behavior is normal. His mood appears not anxious.    ED Course  Procedures (including critical care time)  Medications  ondansetron (ZOFRAN) injection 4 mg (4 mg Intravenous Given 10/19/12 0850)  morphine 4 MG/ML injection 4 mg (4 mg Intravenous Given 10/19/12 0850)  morphine 4 MG/ML injection 4 mg (4 mg Intravenous Given 10/19/12 1004)  ondansetron (ZOFRAN) injection 4 mg (4 mg Intravenous Given 10/19/12 1005)   iohexol (OMNIPAQUE) 300 MG/ML solution 50 mL (50 mLs Oral Contrast Given 10/19/12 1138)  iohexol (OMNIPAQUE) 300 MG/ML solution 100 mL (100 mLs Intravenous Contrast Given 10/19/12 1256)  morphine 4 MG/ML injection 4 mg (4 mg Intravenous Given 10/19/12 1328)  ondansetron (ZOFRAN) injection 4 mg (4 mg Intravenous Given 10/19/12 1326)    DIAGNOSTIC STUDIES: Oxygen Saturation is 99% on RA, normal by my interpretation.    COORDINATION OF CARE: 8:34 AM - Discussed ED treatment with pt at bedside including IV fluids, pain and nausea management, and x-ray and pt agrees.   11:05 AM - Rechecked pt.  X-rays showed no blockages.  Pt reports medications have eased pain in stomach and nausea.    11:13-11:16 AM - Consult with Dr. Benard Rink.  Requests CT of abdomen.  11:17 AM - Spoke with Radiologist.  She recommended contrast CT.  11:31 AM - Discussed need for CAT scan with Pt.  13:18 PM Dr Jena Gauss called, has reviewed his CT scan feels he can go home.   Nurse reports patient has started vomiting and have pain again.   14:55 Dr Karilyn Cota, admit to med-surg, [place]   Results for orders placed during the hospital encounter of 10/19/12  CBC WITH DIFFERENTIAL      Result Value Range   WBC 9.9  4.0 - 10.5 K/uL   RBC 4.14 (*) 4.22 - 5.81 MIL/uL   Hemoglobin 13.7  13.0 - 17.0 g/dL   HCT 40.9  81.1 - 91.4 %   MCV 95.4  78.0 - 100.0 fL   MCH 33.1  26.0 - 34.0 pg   MCHC 34.7  30.0 - 36.0 g/dL   RDW 78.2  95.6 - 21.3 %   Platelets 206  150 - 400 K/uL   Neutrophils Relative % 80 (*) 43 - 77 %   Neutro Abs 7.9 (*) 1.7 - 7.7 K/uL   Lymphocytes Relative 11 (*) 12 - 46 %   Lymphs Abs 1.1  0.7 - 4.0 K/uL   Monocytes Relative 8  3 - 12 %   Monocytes Absolute 0.8  0.1 - 1.0 K/uL   Eosinophils Relative  0  0 - 5 %   Eosinophils Absolute 0.0  0.0 - 0.7 K/uL   Basophils Relative 0  0 - 1 %   Basophils Absolute 0.0  0.0 - 0.1 K/uL  COMPREHENSIVE METABOLIC PANEL      Result Value Range   Sodium 142  135 - 145 mEq/L    Potassium 3.9  3.5 - 5.1 mEq/L   Chloride 102  96 - 112 mEq/L   CO2 32  19 - 32 mEq/L   Glucose, Bld 127 (*) 70 - 99 mg/dL   BUN 11  6 - 23 mg/dL   Creatinine, Ser 1.19  0.50 - 1.35 mg/dL   Calcium 9.4  8.4 - 14.7 mg/dL   Total Protein 7.3  6.0 - 8.3 g/dL   Albumin 4.0  3.5 - 5.2 g/dL   AST 26  0 - 37 U/L   ALT 24  0 - 53 U/L   Alkaline Phosphatase 117  39 - 117 U/L   Total Bilirubin 0.4  0.3 - 1.2 mg/dL   GFR calc non Af Amer 54 (*) >90 mL/min   GFR calc Af Amer 62 (*) >90 mL/min  LIPASE, BLOOD      Result Value Range   Lipase 33  11 - 59 U/L  URINALYSIS, ROUTINE W REFLEX MICROSCOPIC      Result Value Range   Color, Urine YELLOW  YELLOW   APPearance CLEAR  CLEAR   Specific Gravity, Urine 1.010  1.005 - 1.030   pH 8.0  5.0 - 8.0   Glucose, UA NEGATIVE  NEGATIVE mg/dL   Hgb urine dipstick NEGATIVE  NEGATIVE   Bilirubin Urine NEGATIVE  NEGATIVE   Ketones, ur NEGATIVE  NEGATIVE mg/dL   Protein, ur NEGATIVE  NEGATIVE mg/dL   Urobilinogen, UA 0.2  0.0 - 1.0 mg/dL   Nitrite NEGATIVE  NEGATIVE   Leukocytes, UA NEGATIVE  NEGATIVE   Laboratory interpretation all normal    Dg Abd Acute W/chest  10/19/2012   *RADIOLOGY REPORT*  Clinical Data: Abdominal pain, bloating and nausea.  ACUTE ABDOMEN SERIES (ABDOMEN 2 VIEW & CHEST 1 VIEW)  Comparison: Chest x-ray on 07/22/2012 and ERCP procedure yesterday.  Findings: The lungs show evidence of stable chronic disease.  No acute edema or infiltrate is identified.  Heart size is normal.  Abdominal films show no evidence of bowel obstruction, significant ileus or free intraperitoneal air.  There is some loss of contrast in the colon likely related to the contrast injected at the time of ERCP yesterday.  No abnormal calcifications are seen.  Degenerative changes are present in the lower lumbar spine.  IMPRESSION: No acute findings.  Stable chronic lung disease.   Original Report Authenticated By: Irish Lack, M.D.   US Abdomen Limited  Ruq  10/19/2012   *RADIOLOGY REPORT*  Clinical Data:  History of cholecystectomy 8 weeks ago.  Removal of common bile duct stent.  LIMITED ABDOMINAL ULTRASOUND - RIGHT UPPER QUADRANT  Comparison:  07/25/2012.  Findings:  Gallbladder:  Surgically absent.  Common bile duct:  Normal in caliber measuring a maximum of 4.71mm.  Liver:  Mild diffuse fatty infiltration.  The intrahepatic biliary dilatation is much improved.  A 7 mm echogenic lesion in the right lobe is most likely a small hemangioma.  IMPRESSION:  1.  Status post cholecystectomy and common bile duct stent removal. 2.  No common bile duct dilatation and improved intrahepatic biliary dilatation. 3.  Mild diffuse fatty infiltration of the liver and  probable small, 7 mm, right hepatic lobe hemangioma.                    Original Report Authenticated By: Rudie Meyer, M.D.     Dg Ercp Biliary & Pancreatic Ducts  10/18/2012   *RADIOLOGY REPORT*  Clinical Data: Choledocholithiasis  ERCP  Comparison:  Abdominal ultrasound 07/25/2012  Technique:  Multiple spot images obtained with the fluoroscopic device and submitted for interpretation post-procedure.  ERCP was performed by Dr.  [name].  Findings: Multiple intraoperative spot images depict an endoscope in the duodenum and cannulation of the common bile duct.  A past retrograde opacification of the extra and intrahepatic bile duct demonstrates several filling defects in the common bile that consistent with choledocholithiasis.  The images then demonstrate balloon sweeping of the distal common duct.  The plastic biliary sphincter was also removed during the course of the procedure.  IMPRESSION: ERCP, plastic stent removal, sphincterotomy and balloon sweep of the common bile duct as above.  These images were submitted for radiologic interpretation only. Please see the procedural report for the amount of contrast and the fluoroscopy time utilized.   Original Report Authenticated By: Malachy Moan, M.D.       1. Vomiting   2. Abdominal pain   3. Anxiety    Plan admission  MDM    I personally performed the services described in this documentation, which was scribed in my presence. The recorded information has been reviewed and considered.  Devoria Albe, MD, Arm[facility] Gang    [name], MD 10/19/12 [phone]

## 2012-10-19 NOTE — Consult Note (Signed)
Referring Provider: Wilson Singer, MD Primary Care Physician:  Louie Boston, MD Primary Gastroenterologist:  Roetta Sessions, MD  Reason for Consultation:  Abdominal pain  HPI: Edward Trevino is a 79 y.o. male who presented to the emergency department today with complaints of abdominal pain. He has a history of ERCP with biliary sphincterotomy, ampullary balloon dilation, balloon stone extraction, biliary stent placement along with EGD and duodenal biopsy back in March 2014 he presented to the hospital with obstructive jaundice/cholangitis. He had severe erosive esophagitis, abnormalities of the bulb and proximal second portion of the duodenum resulting in partial gastric outlet obstruction. Biopsy showed benign ulcer. H. pylori serologies were negative. CT had shown intra-and extra hepatic biliary ductal dilation. Infiltrative portal hepatus soft tissue inseparable from the common bile duct and gallbladder.  Patient saw Dr. Lovell Sheehan during hospitalization in 07/2012 who advised no need for urgent cholecystectomy and recommended on f/u all biopsy results and consider repeat EGD/ERCP prior to cholecystectomy to ensure no malignancy, retained stones, etc.   I saw patient in the office back on 10/01/2012 to schedule him for EGD/ERCP with stent removal. At that time he denied abdominal pain. Appetite fair. He denied significant alcohol use stating he only drank a bottle of wine once every 6 months.  Yesterday he had EGD, pyloric channel balloon dilation, cholangiogram with extension of prior biliary sphincterotomy, balloon dilation at sphincterotomy site, stone extraction/stent removal. 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  Evaluation in the ED today has shown normal LFTs, lipase, white blood cell count, hemoglobin. Acute abdominal series with no acute findings. Abdominal ultrasound  showed improved intrahepatic biliary dilation. CT of the abdomen pelvis with contrast showed mild haziness in the porta hepatis and common bile duct region felt to be reflective of recent ERCP yesterday. There was no free air.  According to CT and U/S report from today he has had interval cholecystectomy. Discussed with patient who denies prior cholecystectomy. Reexamined patient and there is no evidence of abdominal scars to support recent cholecystectomy. Reviewed images of CT with Dr. Jena Gauss and Dr. Fredirick Lathe. GB was not visualized on current u/s and after new information of no prior cholecystectomy it appears the gallbladder is markedly decompressed on current CT.   Prior to Admission medications   Medication Sig Start Date End Date Taking? Authorizing Provider  clonazePAM (KLONOPIN) 0.5 MG tablet Take 1 tablet (0.5 mg total) by mouth 3 (three) times daily. Takes one tablet twice daily and 2 tablets at bedtime. 07/26/12  Yes Elliot Cousin, MD  mirtazapine (REMERON) 30 MG tablet Take 1 tablet (30 mg total) by mouth at bedtime. 07/26/12  Yes Elliot Cousin, MD  Multiple Vitamin (MULTIVITAMIN WITH MINERALS) TABS Take 1 tablet by mouth daily. 07/26/12  Yes Elliot Cousin, MD  QUEtiapine (SEROQUEL) 100 MG tablet Take 1 tablet (100 mg total) by mouth 3 (three) times daily. 07/26/12  Yes Elliot Cousin, MD  venlafaxine XR (EFFEXOR-XR) 75 MG 24 hr capsule Take 1 capsule (75 mg total) by mouth daily. 07/26/12  Yes Elliot Cousin, MD  vitamin C (ASCORBIC ACID) 500 MG tablet Take 500 mg by mouth daily.   Yes Historical Provider, MD    Current Facility-Administered Medications  Medication Dose Route Frequency Provider Last Rate Last Dose  . 0.9 %  sodium chloride infusion  1,000 mL Intravenous Continuous [name], MD 125 mL/hr at 10/19/12 1145 1,000  mL at 10/19/12 1145   Current Outpatient Prescriptions  Medication Sig Dispense Refill  . clonazePAM (KLONOPIN) 0.5 MG tablet Take 1 tablet (0.5 mg total) by mouth 3  (three) times daily. Takes one tablet twice daily and 2 tablets at bedtime.  90 tablet  0  . mirtazapine (REMERON) 30 MG tablet Take 1 tablet (30 mg total) by mouth at bedtime.  30 tablet  0  . Multiple Vitamin (MULTIVITAMIN WITH MINERALS) TABS Take 1 tablet by mouth daily.      . QUEtiapine (SEROQUEL) 100 MG tablet Take 1 tablet (100 mg total) by mouth 3 (three) times daily.  90 tablet  0  . venlafaxine XR (EFFEXOR-XR) 75 MG 24 hr capsule Take 1 capsule (75 mg total) by mouth daily.  30 capsule  0  . vitamin C (ASCORBIC ACID) 500 MG tablet Take 500 mg by mouth daily.        Allergies as of 10/19/2012  . (No Known Allergies)    Past Medical History  Diagnosis Date  . Depression   . Insomnia   . Anxiety   . Suicide attempt   . Erosive esophagitis 07/25/2012  . Gastric out let obstruction 07/25/2012  . HOH (hard of hearing)     Past Surgical History  Procedure Laterality Date  . Appendectomy    . Back surgery    . Ercp N/A 07/24/2012    Dr. Jena Gauss. Significant abnormalities of the bowl and proximal second portion producing partial gastric outlet stricture and with secondary gastric dilation and severe erosive reflux esophagitis. Biopsy showed benign ulceration. Normal-appearing ampulla, status post biliary sphincterotomy and ampulla balloon dilation, stone extraction and stent placement.  Marland Kitchen Sphincterotomy N/A 07/24/2012    Procedure: SPHINCTEROTOMY;  Surgeon: Corbin Ade, MD;  Location: [facility];  Service: Endoscopy;  Laterality: N/A;  . Esophageal biopsy N/A 07/24/2012    Procedure: BIOPSY;  Surgeon: Corbin Ade, MD;  Location: [facility];  Service: Endoscopy;  Laterality: N/A;  Duodenal and Esophageal Biopsies  . Esophagogastroduodenoscopy N/A 07/24/2012    [id number] ampulla s/p biliary sphincterotomy and ampullary  . Balloon dilation N/A 07/24/2012    Procedure: BALLOON DILATION;  Surgeon: Corbin Ade, MD;  Location: [facility];  Service: Endoscopy;  Laterality: N/A;  Balloon  Stone Extraction, Drudging  . Removal of stones N/A 07/24/2012    Procedure: REMOVAL OF STONES;  Surgeon: Corbin Ade, MD;  Location: [facility];  Service: Endoscopy;  Laterality: N/A;  . Egd/ercp  10/18/2012    Dr. Jena Gauss: ;yloric channel stenosis, s/p dilation and bx, persisting CBD stones with extension of sphincterotomy and sphincterotomy balloon dilation and stone extraction. Removal of biliary stent    Family History  Problem Relation Age of Onset  . Colon cancer Neg Hx     History   Social History  . Marital Status: Single    Spouse Name: N/A    Number of Children: N/A  . Years of Education: N/A   Occupational History  . retired    Social History Main Topics  . Smoking status: Current Every Day Smoker    Types: Cigars  . Smokeless tobacco: Not on file  . Alcohol Use: No     Comment: reported from ED son stated he has been drinking a lot of wine recently  . Drug Use: No  . Sexually Active: No   Other Topics Concern  . Not on file   Social History Narrative  . No narrative on file  ROS:  General: + for anorexia. No weight loss, fever, chills, fatigue. + weakness. Eyes: Negative for vision changes.  ENT: Negative for hoarseness, difficulty swallowing , nasal congestion. CV: Negative for chest pain, angina, palpitations, dyspnea on exertion, peripheral edema.  Respiratory: Negative for dyspnea at rest, dyspnea on exertion, cough, sputum, wheezing.  GI: See history of present illness. GU:  Negative for dysuria, hematuria, urinary incontinence, urinary frequency, nocturnal urination.  MS: Negative for joint pain, low back pain.  Derm: Negative for rash or itching.  Neuro: Negative for weakness, abnormal sensation, seizure, frequent headaches, memory loss, confusion.  Psych: Negative for anxiety, depression, suicidal ideation, hallucinations.  Endo: Negative for unusual weight change.  Heme: Negative for bruising or bleeding. Allergy: Negative for rash or hives.        Physical Examination: Vital signs in last 24 hours: Temp:  [98.9 F (37.2 C)] 98.9 F (37.2 C) (06/06 0826) Pulse Rate:  [67-89] 68 (06/06 1327) Resp:  [18-20] 18 (06/06 1327) BP: (128-159)/(75-80) 159/78 mmHg (06/06 1327) SpO2:  [98 %-99 %] 98 % (06/06 1327) Weight:  [184 lb (83.462 kg)] 184 lb (83.462 kg) (06/06 0826)    General: Well-nourished, well-developed in no acute distress. Somewhat disheveled. Neighbor at bedside. Head: Normocephalic, atraumatic.   Eyes: Conjunctiva pink, no icterus. Mouth: Oropharyngeal mucosa moist and pink , no lesions erythema or exudate.Poor dentition. Neck: Supple without thyromegaly, masses, or lymphadenopathy.  Lungs: Clear to auscultation bilaterally.  Heart: Regular rate and rhythm, no murmurs rubs or gallops.  Abdomen: Bowel sounds are normal, right mid-abd tenderness with subjective guarding. nondistended, no hepatosplenomegaly or masses, no abdominal bruits or    hernia , no rebound.   Rectal: not performed Extremities: No lower extremity edema, clubbing, deformity.  Neuro: Alert and oriented x 4 , grossly normal neurologically.  Skin: Warm and dry, no rash or jaundice.   Psych: Alert and cooperative, normal mood and affect.        Intake/Output from previous day:   Intake/Output this shift:    Lab Results: CBC  Recent Labs  10/19/12 0842  WBC 9.9  HGB 13.7  HCT 39.5  MCV 95.4  PLT 206   BMET  Recent Labs  10/19/12 0842  NA 142  K 3.9  CL 102  CO2 32  GLUCOSE 127*  BUN 11  CREATININE 1.24  CALCIUM 9.4   LFT  Recent Labs  10/19/12 0842  BILITOT 0.4  ALKPHOS 117  AST 26  ALT 24  PROT 7.3  ALBUMIN 4.0   Lab Results  Component Value Date   LIPASE 33 10/19/2012      Imaging Studies: Ct Abdomen Pelvis W Contrast     **ADDENDUM** CREATED: 10/19/2012 15:13:52  The patient has apparently not had a cholecystectomy despite the  history provided. The gallbladder is not visualized on the  ultrasound  examination.  **END ADDENDUM** SIGNED BY: Cyndie Chime, M.D.       Reyes Ivan, MD Caleen Essex Oct 19, 2012 3:11:33 PM EDT       **ADDENDUM** CREATED: 10/19/2012 15:03:34  Despite a previous report of recent cholecystectomy, provided on  sonographic examination of 10/19/2012, the patient adamantly denies  such history. On the current examination, gallbladder is markedly  decompressed.    10/19/12*RADIOLOGY REPORT*  Clinical Data: Years CT yesterday with removal of biliary stent. Mild abdominal pain and vomiting.  CT ABDOMEN AND PELVIS WITH CONTRAST  Technique:  Multidetector CT imaging of the abdomen and pelvis was performed following  the standard protocol during bolus administration of intravenous contrast.  Contrast: 50mL OMNIPAQUE IOHEXOL 300 MG/ML  SOLN, OMNIPAQUE IOHEXOL 300 MG/ML  SOLN  Comparison: 07/23/2012.  Findings: Lung bases show scattered scarring and calcified granulomas. Trace left pleural fluid.  Heart size normal.  No pericardial effusion.  There is apparent thickening of the lower esophageal wall which can be seen with gastroesophageal reflux disease.  Liver is unremarkable.  Interval cholecystectomy.  Haziness about the porta hepatis and extrahepatic bile duct likely relate to recent intervention performed yesterday with removal of the biliary stent.  Extrahepatic bile duct does not appear dilated.  Adrenal glands are unremarkable.  There may be a sub centimeter low attenuation lesion in the lower pole right kidney, too small to characterize.  Kidneys, spleen, pancreas, stomach and bowel are otherwise unremarkable.  Small bilateral inguinal hernias contain fat.  Bladder is distended with urine.  Prostate is enlarged.  No pathologically enlarged lymph nodes.  Atherosclerotic calcification of the arterial vasculature without abdominal aortic aneurysm.  No extraluminal contrast or air.  No worrisome lytic or sclerotic lesions.  IMPRESSION:  1.  Mild haziness in the porta hepatis  and common bile duct region is likely reflective of recent ERCP and stent removal performed yesterday.  No evidence of free air. 2.  Trace left pleural fluid. 3.  Small bilateral inguinal hernias contain fat. 4.  Prostate enlargement.   Original Report Authenticated By: Leanna Battles, M.D.   Dg Ercp Biliary & Pancreatic Ducts  10/18/2012   *RADIOLOGY REPORT*  Clinical Data: Choledocholithiasis  ERCP  Comparison:  Abdominal ultrasound 07/25/2012  Technique:  Multiple spot images obtained with the fluoroscopic device and submitted for interpretation post-procedure.  ERCP was performed by Dr.  [name].  Findings: Multiple intraoperative spot images depict an endoscope in the duodenum and cannulation of the common bile duct.  A past retrograde opacification of the extra and intrahepatic bile duct demonstrates several filling defects in the common bile that consistent with choledocholithiasis.  The images then demonstrate balloon sweeping of the distal common duct.  The plastic biliary sphincter was also removed during the course of the procedure.  IMPRESSION: ERCP, plastic stent removal, sphincterotomy and balloon sweep of the common bile duct as above.  These images were submitted for radiologic interpretation only. Please see the procedural report for the amount of contrast and the fluoroscopy time utilized.   Original Report Authenticated By: Malachy Moan, M.D.   Dg Abd Acute W/chest  10/19/2012   *RADIOLOGY REPORT*  Clinical Data: Abdominal pain, bloating and nausea.  ACUTE ABDOMEN SERIES (ABDOMEN 2 VIEW & CHEST 1 VIEW)  Comparison: Chest x-ray on 07/22/2012 and ERCP procedure yesterday.  Findings: The lungs show evidence of stable chronic disease.  No acute edema or infiltrate is identified.  Heart size is normal.  Abdominal films show no evidence of bowel obstruction, significant ileus or free intraperitoneal air.  There is some loss of contrast in the colon likely related to the contrast injected at  the time of ERCP yesterday.  No abnormal calcifications are seen.  Degenerative changes are present in the lower lumbar spine.  IMPRESSION: No acute findings.  Stable chronic lung disease.   Original Report Authenticated By: Irish Lack, M.D.   US Abdomen Limited Ruq Cristi Loron, MD Caleen Essex Oct 19, 2012 3:18:21 PM EDT       **ADDENDUM** CREATED: 10/19/2012 15:13:52  The patient has apparently not had a cholecystectomy despite the  history provided. The gallbladder is  not visualized on the  ultrasound examination.  **END ADDENDUM** SIGNED BY: Cyndie Chime, M.D.   10/19/12*RADIOLOGY REPORT*  Clinical Data:  History of cholecystectomy 8 weeks ago.  Removal of common bile duct stent.  LIMITED ABDOMINAL ULTRASOUND - RIGHT UPPER QUADRANT  Comparison:  07/25/2012.  Findings:  Gallbladder:  Surgically absent.  Common bile duct:  Normal in caliber measuring a maximum of 4.50mm.  Liver:  Mild diffuse fatty infiltration.  The intrahepatic biliary dilatation is much improved.  A 7 mm echogenic lesion in the right lobe is most likely a small hemangioma.  IMPRESSION:  1.  Status post cholecystectomy and common bile duct stent removal. 2.  No common bile duct dilatation and improved intrahepatic biliary dilatation. 3.  Mild diffuse fatty infiltration of the liver and probable small, 7 mm, right hepatic lobe hemangioma.                    Original Report Authenticated By: Rudie Meyer, M.D.  Pierre.Alas week]   Impression: 79 y/o with right sided abdominal pain and nausea s/p EGD, pyloric channel balloon dilation, cholangiogram with extension of prior biliary sphincterotomy, balloon dilation at sphincterotomy site, stone extraction/stent removal. No evidence of biliary obstruction, post-ERCP pancreatitis, retroperitoneal perforation. No evidence of post-procedure complication. Comparing prior CT A/P and abd U/S from 07/2012 to today, his gallbladder appears markedly decompressed. ?evolving acute cholecystitis as cause  of his abdominal pain, nausea.   Plan: 1. Discussed with Dr. Karilyn Cota. Plans for overnight admission. Recommend nonurgent surgical consultation during his hospital stay.  2. Trial of clear liquids as per Dr. Jena Gauss.  I would like to thank Dr. Lynelle Doctor for allowing [place] to take part in the care of this nice patient.    LOS: 0 days   Tana Coast  10/19/2012, 3:25 PM

## 2012-10-19 NOTE — ED Notes (Signed)
Pt states abdominal pain and nausea began at 1930 last night. He had a bile stent removed yesterday in short stay.

## 2012-10-20 DIAGNOSIS — R109 Unspecified abdominal pain: Secondary | ICD-10-CM

## 2012-10-20 DIAGNOSIS — K311 Adult hypertrophic pyloric stenosis: Secondary | ICD-10-CM

## 2012-10-20 DIAGNOSIS — D649 Anemia, unspecified: Secondary | ICD-10-CM

## 2012-10-20 LAB — CBC
HCT: 40.5 % (ref 39.0–52.0)
Hemoglobin: 13.8 g/dL (ref 13.0–17.0)
MCH: 32.9 pg (ref 26.0–34.0)
MCHC: 34.1 g/dL (ref 30.0–36.0)
MCV: 96.7 fL (ref 78.0–100.0)
Platelets: 178 K/uL (ref 150–400)
RBC: 4.19 MIL/uL — ABNORMAL LOW (ref 4.22–5.81)
RDW: 12.3 % (ref 11.5–15.5)
WBC: 13 K/uL — ABNORMAL HIGH (ref 4.0–10.5)

## 2012-10-20 LAB — COMPREHENSIVE METABOLIC PANEL
ALT: 20 U/L (ref 0–53)
AST: 24 U/L (ref 0–37)
Calcium: 8.4 mg/dL (ref 8.4–10.5)
Chloride: 101 mEq/L (ref 96–112)
Glucose, Bld: 116 mg/dL — ABNORMAL HIGH (ref 70–99)
Sodium: 134 mEq/L — ABNORMAL LOW (ref 135–145)
Total Protein: 6.9 g/dL (ref 6.0–8.3)

## 2012-10-20 LAB — GLUCOSE, CAPILLARY
Glucose-Capillary: 99 mg/dL (ref 70–99)
Glucose-Capillary: 101 mg/dL — ABNORMAL HIGH (ref 70–99)

## 2012-10-20 NOTE — Progress Notes (Signed)
Subjective: Patient states nausea much better. Right-sided abdominal pain much better with dilaudid.  Objective: Vital signs in last 24 hours: Temp:  [98 F (36.7 C)-99.5 F (37.5 C)] 99.5 F (37.5 C) (06/07 0630) Pulse Rate:  [65-88] 88 (06/07 0630) Resp:  [18-20] 20 (06/07 0630) BP: (108-159)/(62-80) 112/65 mmHg (06/07 0630) SpO2:  [95 %-100 %] 95 % (06/07 0630) Weight:  [184 lb 15.5 oz (83.9 kg)] 184 lb 15.5 oz (83.9 kg) (06/06 1726) Last BM Date: 10/18/12 General:   Alert, conversant appears in no acute distress. He by family members.  Abdomen:  Nondistended. Positive bowel sounds soft with minimal right upper quadrant tenderness to palpation. No mass organomegaly. Extremities:  Without clubbing or edema.    Intake/Output from previous day: 06/06 0701 - 06/07 0700 In: 360 [P.O.:360] Out: 800 [Urine:800] Intake/Output this shift: Total I/O In: 120 [P.O.:120] Out: 400 [Urine:400]  Lab Results:  Recent Labs  10/19/12 0842 10/20/12 0706  WBC 9.9 13.0*  HGB 13.7 13.8  HCT 39.5 40.5  PLT 206 178   BMET  Recent Labs  10/19/12 0842 10/20/12 0706  NA 142 134*  K 3.9 4.2  CL 102 101  CO2 32 26  GLUCOSE 127* 116*  BUN 11 7  CREATININE 1.24 0.94  CALCIUM 9.4 8.4   LFT  Recent Labs  10/20/12 0706  PROT 6.9  ALBUMIN 3.5  AST 24  ALT 20  ALKPHOS 111  BILITOT 0.5   Impression: Recurrent right-sided biliary-like pain. Gallbladder appears markedly abnormal on recent imaging. No apparent complications from recent ERCP. He overall appears clinically improved from how he looked in the ED yesterday.  Recommendations:    Keep him on a clear liquid diet. Await surgery consultation. I believe he would benefit from cholecystectomy-discussed with Dr. Kerry Hough.

## 2012-10-20 NOTE — Progress Notes (Signed)
[facility] PROGRESS NOTE  Edward Trevino JXB:[id number] DOB: 12-13-1931 DOA: 10/19/2012 PCP: Louie Boston, MD  Assessment/Plan: 1. Abdominal pain.  Biliary-like pain. Gallbladder appears markedly abnormal on recent imaging.  Patient recently had ERCP done.  GI following and does not report any complications from procedure.  It is felt that patient may benefit from cholecystectomy to help alleviate his symptoms.  Surgical consult is pending. 2. Anxiety on benzodiazepines 3. Gastric outlet obstruction. Nausea and vomiting appears to have improved with medications. 4. Leukocytosis.  Possibly related to #1, will continue to follow  Code Status: full code Family Communication: discussed with patient at the bedside Disposition Plan: discharge home once improved   Consultants:  Gastroenterology, Dr. Jena Gauss  Procedures:  none  Antibiotics:  none  HPI/Subjective: Feeling better, no nausea or vomiting.  Did not feel like eating anything this morning.  Objective: Filed Vitals:   10/19/12 1619 10/19/12 1726 10/19/12 2042 10/20/12 0630  BP: 146/63 141/65 108/62 112/65  Pulse: 73 65 76 88  Temp: 98.8 F (37.1 C) 98.1 F (36.7 C) 98 F (36.7 C) 99.5 F (37.5 C)  TempSrc: Oral Oral Oral Oral  Resp: 19 20 20 20   Height:  6\' 3"  (1.905 m)    Weight:  83.9 kg (184 lb 15.5 oz)    SpO2: 99% 100% 100% 95%    Intake/Output Summary (Last 24 hours) at 10/20/12 1225 Last data filed at 10/20/12 0904  Gross per 24 hour  Intake    480 ml  Output   1200 ml  Net   -720 ml   Filed Weights   10/19/12 0826 10/19/12 1726  Weight: 83.462 kg (184 lb) 83.9 kg (184 lb 15.5 oz)    Exam:   General:  NAD  Cardiovascular: S1, s2, RRR  Respiratory: CTA B  Abdomen: soft, nt, nd, bs+  Musculoskeletal: no edema b/l   Data Reviewed: Basic Metabolic Panel:  Recent Labs Lab 10/15/12 1410 10/19/12 0842 10/20/12 0706  NA 140 142 134*  K 3.9 3.9 4.2  CL 101 102 101  CO2 28 32 26   GLUCOSE 144* 127* 116*  BUN 9 11 7   CREATININE 1.11 1.24 0.94  CALCIUM 9.2 9.4 8.4   Liver Function Tests:  Recent Labs Lab 10/19/12 0842 10/20/12 0706  AST 26 24  ALT 24 20  ALKPHOS 117 111  BILITOT 0.4 0.5  PROT 7.3 6.9  ALBUMIN 4.0 3.5    Recent Labs Lab 10/19/12 0842  LIPASE 33   No results found for this basename: AMMONIA,  in the last 168 hours CBC:  Recent Labs Lab 10/15/12 1410 10/19/12 0842 10/20/12 0706  WBC  --  9.9 13.0*  NEUTROABS  --  7.9*  --   HGB 13.8 13.7 13.8  HCT 38.8* 39.5 40.5  MCV  --  95.4 96.7  PLT  --  206 178   Cardiac Enzymes: No results found for this basename: CKTOTAL, CKMB, CKMBINDEX, TROPONINI,  in the last 168 hours BNP (last 3 results) No results found for this basename: PROBNP,  in the last 8760 hours CBG:  Recent Labs Lab 10/20/12 0758 10/20/12 1206  GLUCAP 99 101*    No results found for this or any previous visit (from the past 240 hour(s)).   Studies: Ct Abdomen Pelvis W Contrast  10/19/2012   **ADDENDUM** CREATED: 10/19/2012 15:13:52  The patient has apparently not had a cholecystectomy despite the history provided.  The gallbladder is not visualized on the ultrasound examination.  **  END ADDENDUM** SIGNED BY: Cyndie Chime, M.D.  10/19/2012   **ADDENDUM** CREATED: 10/19/2012 15:03:34  Despite a previous report of recent cholecystectomy, provided on sonographic examination of 10/19/2012, the patient adamantly denies such history.  On the current examination, gallbladder is markedly decompressed.  **END ADDENDUM** SIGNED BY: Reyes Ivan, M.D.  10/19/2012   *RADIOLOGY REPORT*  Clinical Data: Years CT yesterday with removal of biliary stent. Mild abdominal pain and vomiting.  CT ABDOMEN AND PELVIS WITH CONTRAST  Technique:  Multidetector CT imaging of the abdomen and pelvis was performed following the standard protocol during bolus administration of intravenous contrast.  Contrast: 50mL OMNIPAQUE IOHEXOL 300 MG/ML   SOLN, OMNIPAQUE IOHEXOL 300 MG/ML  SOLN  Comparison: 07/23/2012.  Findings: Lung bases show scattered scarring and calcified granulomas. Trace left pleural fluid.  Heart size normal.  No pericardial effusion.  There is apparent thickening of the lower esophageal wall which can be seen with gastroesophageal reflux disease.  Liver is unremarkable.  Interval cholecystectomy.  Haziness about the porta hepatis and extrahepatic bile duct likely relate to recent intervention performed yesterday with removal of the biliary stent.  Extrahepatic bile duct does not appear dilated.  Adrenal glands are unremarkable.  There may be a sub centimeter low attenuation lesion in the lower pole right kidney, too small to characterize.  Kidneys, spleen, pancreas, stomach and bowel are otherwise unremarkable.  Small bilateral inguinal hernias contain fat.  Bladder is distended with urine.  Prostate is enlarged.  No pathologically enlarged lymph nodes.  Atherosclerotic calcification of the arterial vasculature without abdominal aortic aneurysm.  No extraluminal contrast or air.  No worrisome lytic or sclerotic lesions.  IMPRESSION:  1.  Mild haziness in the porta hepatis and common bile duct region is likely reflective of recent ERCP and stent removal performed yesterday.  No evidence of free air. 2.  Trace left pleural fluid. 3.  Small bilateral inguinal hernias contain fat. 4.  Prostate enlargement.   Original Report Authenticated By: Leanna Battles, M.D.   Dg Abd Acute W/chest  10/19/2012   *RADIOLOGY REPORT*  Clinical Data: Abdominal pain, bloating and nausea.  ACUTE ABDOMEN SERIES (ABDOMEN 2 VIEW & CHEST 1 VIEW)  Comparison: Chest x-ray on 07/22/2012 and ERCP procedure yesterday.  Findings: The lungs show evidence of stable chronic disease.  No acute edema or infiltrate is identified.  Heart size is normal.  Abdominal films show no evidence of bowel obstruction, significant ileus or free intraperitoneal air.  There is some  loss of contrast in the colon likely related to the contrast injected at the time of ERCP yesterday.  No abnormal calcifications are seen.  Degenerative changes are present in the lower lumbar spine.  IMPRESSION: No acute findings.  Stable chronic lung disease.   Original Report Authenticated By: Irish Lack, M.D.   US Abdomen Limited Ruq  10/19/2012   *RADIOLOGY REPORT*  Clinical Data:  History of cholecystectomy 8 weeks ago.  Removal of common bile duct stent.  LIMITED ABDOMINAL ULTRASOUND - RIGHT UPPER QUADRANT  Comparison:  07/25/2012.  Findings:  Gallbladder:  Surgically absent.  Common bile duct:  Normal in caliber measuring a maximum of 4.82mm.  Liver:  Mild diffuse fatty infiltration.  The intrahepatic biliary dilatation is much improved.  A 7 mm echogenic lesion in the right lobe is most likely a small hemangioma.  IMPRESSION:  1.  Status post cholecystectomy and common bile duct stent removal. 2.  No common bile duct dilatation and improved intrahepatic biliary  dilatation. 3.  Mild diffuse fatty infiltration of the liver and probable small, 7 mm, right hepatic lobe hemangioma.                    Original Report Authenticated By: Rudie Meyer, M.D.    Scheduled Meds: . clonazePAM  0.5 mg Oral TID  . enoxaparin (LOVENOX) injection  40 mg Subcutaneous Q24H  . ondansetron  4 mg Oral Q4H   Or  . ondansetron (ZOFRAN) IV  4 mg Intravenous Q4H  . QUEtiapine  100 mg Oral TID   Continuous Infusions: . sodium chloride 125 mL/hr at 10/20/12 1914    Principal Problem:   Abdominal pain Active Problems:   Depression   Gastric out let obstruction   Anemia   Nausea    Time spent:    [facility]  [facility] Pager [phone]. If 7PM-7AM, please contact night-coverage at [email or web address], password [facility] 10/20/2012, 12:25 PM  LOS: 1 day

## 2012-10-20 NOTE — Discharge Summary (Signed)
Physician Discharge Summary  Edward Trevino FAO:[id number] DOB: 08/07/1931 DOA: 10/19/2012  PCP: Louie Boston, MD  Admit date: 10/19/2012 Discharge date: 10/20/2012  Time spent: 45 minutes  Recommendations for Outpatient Follow-up:  1. Patient will follow up with general surgery as an outpatient to further discuss need for cholecystectomy. He can followup with gastroenterology as previously scheduled. Follow primary care physician in 2 weeks.  Discharge Diagnoses:  Principal Problem:   Abdominal pain Active Problems:   Depression   Gastric out let obstruction   Anemia   Nausea   Discharge Condition: Improved  Diet recommendation: Low-salt, low-fat  Filed Weights   10/19/12 0826 10/19/12 1726  Weight: 83.462 kg (184 lb) 83.9 kg (184 lb 15.5 oz)    History of present illness:  Edward Trevino is a 79 y.o. male with past medical history that includes depression anxiety gastric outlet obstruction presents to the emergency department today with chief complaint of abdominal pain and nausea. Information is obtained from the patient. Patient is status post EGD yesterday with pyloric channel balloon dilation cholangiogram with extension of prior biliary sphincterotomy, balloon dilation at sphincterotomy site, stone extraction and stent removal. He reports that he did well yesterday until about 8 PM in the evening when he developed diffuse abdominal pain and persistent nausea denies any vomiting. He has had nothing to eat or drink since last evening. He reports that the pain is throughout his entire abdomen it is constant nothing makes it better or worse. He denies any emesis or coffee ground emesis. He denies a sensation of bloating. He reports having 1 episode of diarrhea and has not had a BM since yesterday evening. He denies any flatulence. He denies fever chills chest pain shortness of breath headache. Workup in the emergency room yielded normal LFTs, lipase, white blood cell count, hemoglobin.  Acute abdominal series with no acute findings. Abdominal ultrasound showed improved intrahepatic biliary dilation. CT of the abdomen and pelvis with contrast showed mild haziness in the porta hepatis and common bile duct region felt to be reflective of recent ERCP yesterday. There was no free air . Patient seen by GI in the emergency room who recommended an overnight admission.   Hospital Course:  The stoma was admitted to the hospital for intractable nausea and abdominal pain. He recently had ERCP done and had a biliary stent removed as well as biliary stone extraction. LFTs are felt to be normal range. On imaging, gallbladder was noted to be markedly contracted. It was felt that his symptoms might be related to a gallbladder etiology. He was seen by gastroenterology and did not feel the patient had any complications from ERCP. They did recommend a surgical consultation with possible cholecystectomy. Patient had been off for cholecystectomy in the past few months but had declined. He was seen by Dr. Leticia Trevino in the hospital and wished to pursue further workup as an outpatient. He felt significantly improved since admission and was able to tolerate a regular diet. He was very adamant about being discharged home today. He will followup with Dr. Leticia Trevino in the outpatient setting to further discuss need for cholecystectomy. He did have a mildly elevated white count at 13, but does not have any evidence for acute infection, he does not appear toxic , appears to be at baseline and is tolerating a by mouth diet. This was discussed with general surgery, and it was felt that if the patient is tolerating a solid food diet without any recurrence of symptoms, then it  would be reasonable to pursue this workup as an outpatient. Patient will be discharged home today.  Procedures:  None  Consultations:  Gastroenterology, Dr. Jena Gauss  General surgery, Dr. Leticia Trevino  Discharge Exam: Filed Vitals:   10/19/12 1726  10/19/12 2042 10/20/12 0630 10/20/12 1432  BP: 141/65 108/62 112/65 118/66  Pulse: 65 76 88 80  Temp: 98.1 F (36.7 C) 98 F (36.7 C) 99.5 F (37.5 C) 98.8 F (37.1 C)  TempSrc: Oral Oral Oral Oral  Resp: 20 20 20 20   Height: 6\' 3"  (1.905 m)     Weight: 83.9 kg (184 lb 15.5 oz)     SpO2: 100% 100% 95% 100%    General: No acute distress Cardiovascular: S1, S2, regular rate and rhythm Respiratory: Clear to auscultation bilaterally Abdomen: Soft, nontender, nondistended, positive bowel sounds  Discharge Instructions  Discharge Orders   Future Appointments Provider Department Dept Phone   11/13/2012 10:00 AM Nira Retort, NP [facility] [phone]   Future Orders Complete By Expires     Call MD for:  persistant nausea and vomiting  As directed     Call MD for:  severe uncontrolled pain  As directed     Call MD for:  temperature >100.4  As directed     Diet - low sodium heart healthy  As directed     Increase activity slowly  As directed         Medication List    TAKE these medications       clonazePAM 0.5 MG tablet  Commonly known as:  KLONOPIN  Take 1 tablet (0.5 mg total) by mouth 3 (three) times daily. Takes one tablet twice daily and 2 tablets at bedtime.     mirtazapine 30 MG tablet  Commonly known as:  REMERON  Take 1 tablet (30 mg total) by mouth at bedtime.     multivitamin with minerals Tabs  Take 1 tablet by mouth daily.     QUEtiapine 100 MG tablet  Commonly known as:  SEROQUEL  Take 1 tablet (100 mg total) by mouth 3 (three) times daily.     venlafaxine XR 75 MG 24 hr capsule  Commonly known as:  EFFEXOR-XR  Take 1 capsule (75 mg total) by mouth daily.     vitamin C 500 MG tablet  Commonly known as:  ASCORBIC ACID  Take 500 mg by mouth daily.       No Known Allergies     Follow-up Information   Follow up with TAPPER,DAVID B, MD. Schedule an appointment as soon as possible for a visit in 2 weeks.   Contact  information:   [address] [place] [address] [phone]       Follow up with Fabio Bering, MD. Schedule an appointment as soon as possible for a visit in 1 week. (general surgeon)    Contact information:   [address] [place] [address] [phone]        The results of significant diagnostics from this hospitalization (including imaging, microbiology, ancillary and laboratory) are listed below for reference.    Significant Diagnostic Studies: Ct Abdomen Pelvis W Contrast  10/19/2012   **ADDENDUM** CREATED: 10/19/2012 15:13:52  The patient has apparently not had a cholecystectomy despite the history provided.  The gallbladder is not visualized on the ultrasound examination.  **END ADDENDUM** SIGNED BY: Cyndie Chime, M.D.  10/19/2012   **ADDENDUM** CREATED: 10/19/2012 15:03:34  Despite a previous report of recent cholecystectomy, provided  on sonographic examination of 10/19/2012, the patient adamantly denies such history.  On the current examination, gallbladder is markedly decompressed.  **END ADDENDUM** SIGNED BY: Reyes Ivan, M.D.  10/19/2012   *RADIOLOGY REPORT*  Clinical Data: Years CT yesterday with removal of biliary stent. Mild abdominal pain and vomiting.  CT ABDOMEN AND PELVIS WITH CONTRAST  Technique:  Multidetector CT imaging of the abdomen and pelvis was performed following the standard protocol during bolus administration of intravenous contrast.  Contrast: 50mL OMNIPAQUE IOHEXOL 300 MG/ML  SOLN, OMNIPAQUE IOHEXOL 300 MG/ML  SOLN  Comparison: 07/23/2012.  Findings: Lung bases show scattered scarring and calcified granulomas. Trace left pleural fluid.  Heart size normal.  No pericardial effusion.  There is apparent thickening of the lower esophageal wall which can be seen with gastroesophageal reflux disease.  Liver is unremarkable.  Interval cholecystectomy.  Haziness about the porta hepatis and extrahepatic bile duct likely relate to recent  intervention performed yesterday with removal of the biliary stent.  Extrahepatic bile duct does not appear dilated.  Adrenal glands are unremarkable.  There may be a sub centimeter low attenuation lesion in the lower pole right kidney, too small to characterize.  Kidneys, spleen, pancreas, stomach and bowel are otherwise unremarkable.  Small bilateral inguinal hernias contain fat.  Bladder is distended with urine.  Prostate is enlarged.  No pathologically enlarged lymph nodes.  Atherosclerotic calcification of the arterial vasculature without abdominal aortic aneurysm.  No extraluminal contrast or air.  No worrisome lytic or sclerotic lesions.  IMPRESSION:  1.  Mild haziness in the porta hepatis and common bile duct region is likely reflective of recent ERCP and stent removal performed yesterday.  No evidence of free air. 2.  Trace left pleural fluid. 3.  Small bilateral inguinal hernias contain fat. 4.  Prostate enlargement.   Original Report Authenticated By: Leanna Battles, M.D.   Dg Ercp Biliary & Pancreatic Ducts  10/18/2012   *RADIOLOGY REPORT*  Clinical Data: Choledocholithiasis  ERCP  Comparison:  Abdominal ultrasound 07/25/2012  Technique:  Multiple spot images obtained with the fluoroscopic device and submitted for interpretation post-procedure.  ERCP was performed by Dr.  [name].  Findings: Multiple intraoperative spot images depict an endoscope in the duodenum and cannulation of the common bile duct.  A past retrograde opacification of the extra and intrahepatic bile duct demonstrates several filling defects in the common bile that consistent with choledocholithiasis.  The images then demonstrate balloon sweeping of the distal common duct.  The plastic biliary sphincter was also removed during the course of the procedure.  IMPRESSION: ERCP, plastic stent removal, sphincterotomy and balloon sweep of the common bile duct as above.  These images were submitted for radiologic interpretation only.  Please see the procedural report for the amount of contrast and the fluoroscopy time utilized.   Original Report Authenticated By: Malachy Moan, M.D.   Dg Abd Acute W/chest  10/19/2012   *RADIOLOGY REPORT*  Clinical Data: Abdominal pain, bloating and nausea.  ACUTE ABDOMEN SERIES (ABDOMEN 2 VIEW & CHEST 1 VIEW)  Comparison: Chest x-ray on 07/22/2012 and ERCP procedure yesterday.  Findings: The lungs show evidence of stable chronic disease.  No acute edema or infiltrate is identified.  Heart size is normal.  Abdominal films show no evidence of bowel obstruction, significant ileus or free intraperitoneal air.  There is some loss of contrast in the colon likely related to the contrast injected at the time of ERCP yesterday.  No abnormal calcifications are seen.  Degenerative changes are present in the lower lumbar spine.  IMPRESSION: No acute findings.  Stable chronic lung disease.   Original Report Authenticated By: Irish Lack, M.D.   US Abdomen Limited Ruq  10/19/2012   *RADIOLOGY REPORT*  Clinical Data:  History of cholecystectomy 8 weeks ago.  Removal of common bile duct stent.  LIMITED ABDOMINAL ULTRASOUND - RIGHT UPPER QUADRANT  Comparison:  07/25/2012.  Findings:  Gallbladder:  Surgically absent.  Common bile duct:  Normal in caliber measuring a maximum of 4.79mm.  Liver:  Mild diffuse fatty infiltration.  The intrahepatic biliary dilatation is much improved.  A 7 mm echogenic lesion in the right lobe is most likely a small hemangioma.  IMPRESSION:  1.  Status post cholecystectomy and common bile duct stent removal. 2.  No common bile duct dilatation and improved intrahepatic biliary dilatation. 3.  Mild diffuse fatty infiltration of the liver and probable small, 7 mm, right hepatic lobe hemangioma.                    Original Report Authenticated By: Rudie Meyer, M.D.    Microbiology: No results found for this or any previous visit (from the past 240 hour(s)).   Labs: Basic Metabolic  Panel:  Recent Labs Lab 10/15/12 1410 10/19/12 0842 10/20/12 0706  NA 140 142 134*  K 3.9 3.9 4.2  CL 101 102 101  CO2 28 32 26  GLUCOSE 144* 127* 116*  BUN 9 11 7   CREATININE 1.11 1.24 0.94  CALCIUM 9.2 9.4 8.4   Liver Function Tests:  Recent Labs Lab 10/19/12 0842 10/20/12 0706  AST 26 24  ALT 24 20  ALKPHOS 117 111  BILITOT 0.4 0.5  PROT 7.3 6.9  ALBUMIN 4.0 3.5    Recent Labs Lab 10/19/12 0842  LIPASE 33   No results found for this basename: AMMONIA,  in the last 168 hours CBC:  Recent Labs Lab 10/15/12 1410 10/19/12 0842 10/20/12 0706  WBC  --  9.9 13.0*  NEUTROABS  --  7.9*  --   HGB 13.8 13.7 13.8  HCT 38.8* 39.5 40.5  MCV  --  95.4 96.7  PLT  --  206 178   Cardiac Enzymes: No results found for this basename: CKTOTAL, CKMB, CKMBINDEX, TROPONINI,  in the last 168 hours BNP: BNP (last 3 results) No results found for this basename: PROBNP,  in the last 8760 hours CBG:  Recent Labs Lab 10/20/12 0758 10/20/12 1206  GLUCAP 99 101*       Signed:  Mandel Seiden  [facility] 10/20/2012, 6:08 PM

## 2012-10-20 NOTE — Consult Note (Signed)
Reason for Consult: Abdominal pain, recent history of biliary stent. Referring Physician: [facility] hospitalist  Edward Trevino is an 79 y.o. male.  HPI: Patient presented to [facility] with epigastric pain referred to the right upper quadrant. Patient states it had similar symptomatology in the past. He is actually had a biliary stent placed this was removed yesterday. Since that time patient states his symptomatology is actually improved. He denies any ongoing pain at this time. He is tolerating a liquid diet. No nausea. Occasional indigestion in the past but status is improved currently. No fevers or chills. Patient is adamant about wanting to be discharged as soon as possible.  Past Medical History  Diagnosis Date  . Depression   . Insomnia   . Anxiety   . Suicide attempt   . Erosive esophagitis 07/25/2012  . Gastric out let obstruction 07/25/2012  . HOH (hard of hearing)     Past Surgical History  Procedure Laterality Date  . Appendectomy    . Back surgery    . Ercp N/A 07/24/2012    Dr. Jena Gauss. Significant abnormalities of the bowl and proximal second portion producing partial gastric outlet stricture and with secondary gastric dilation and severe erosive reflux esophagitis. Biopsy showed benign ulceration. Normal-appearing ampulla, status post biliary sphincterotomy and ampulla balloon dilation, stone extraction and stent placement.  Marland Kitchen Sphincterotomy N/A 07/24/2012    Procedure: SPHINCTEROTOMY;  Surgeon: Corbin Ade, MD;  Location: [facility];  Service: Endoscopy;  Laterality: N/A;  . Esophageal biopsy N/A 07/24/2012    Procedure: BIOPSY;  Surgeon: Corbin Ade, MD;  Location: [facility];  Service: Endoscopy;  Laterality: N/A;  Duodenal and Esophageal Biopsies  . Esophagogastroduodenoscopy N/A 07/24/2012    [id number] ampulla s/p biliary sphincterotomy and ampullary  . Balloon dilation N/A 07/24/2012    Procedure: BALLOON DILATION;  Surgeon: Corbin Ade, MD;  Location: [facility];  Service: Endoscopy;  Laterality: N/A;  Balloon Stone Extraction, Drudging  . Removal of stones N/A 07/24/2012    Procedure: REMOVAL OF STONES;  Surgeon: Corbin Ade, MD;  Location: [facility];  Service: Endoscopy;  Laterality: N/A;  . Egd/ercp  10/18/2012    Dr. Jena Gauss: ;yloric channel stenosis, s/p dilation and bx, persisting CBD stones with extension of sphincterotomy and sphincterotomy balloon dilation and stone extraction. Removal of biliary stent  . Esophagogastroduodenoscopy (egd) with propofol N/A 10/18/2012    Procedure: ESOPHAGOGASTRODUODENOSCOPY (EGD) WITH PROPOFOL;  Surgeon: Corbin Ade, MD;  Location: [facility];  Service: Endoscopy;  Laterality: N/A;  . Ercp N/A 10/18/2012    Procedure: ENDOSCOPIC RETROGRADE CHOLANGIOPANCREATOGRAPHY (ERCP);  Surgeon: Corbin Ade, MD;  Location: [facility];  Service: Endoscopy;  Laterality: N/A;  . Removal of stones N/A 10/18/2012    Procedure: REMOVAL OF STONES;  Surgeon: Corbin Ade, MD;  Location: [facility];  Service: Endoscopy;  Laterality: N/A;  . Esophageal biopsy N/A 10/18/2012    Procedure: BIOPSY;  Surgeon: Corbin Ade, MD;  Location: [facility];  Service: Endoscopy;  Laterality: N/A;    Family History  Problem Relation Age of Onset  . Colon cancer Neg Hx     Social History:  reports that he has been smoking Cigars.  He does not have any smokeless tobacco history on file. He reports that he does not drink alcohol or use illicit drugs.  Allergies: No Known Allergies  Medications:  I have reviewed the patient's current medications. Prior to Admission:  Prescriptions prior to admission  Medication Sig  Dispense Refill  . clonazePAM (KLONOPIN) 0.5 MG tablet Take 1 tablet (0.5 mg total) by mouth 3 (three) times daily. Takes one tablet twice daily and 2 tablets at bedtime.  90 tablet  0  . mirtazapine (REMERON) 30 MG tablet Take 1 tablet (30 mg total) by mouth at bedtime.  30 tablet  0  . Multiple Vitamin (MULTIVITAMIN WITH MINERALS) TABS Take 1  tablet by mouth daily.      . QUEtiapine (SEROQUEL) 100 MG tablet Take 1 tablet (100 mg total) by mouth 3 (three) times daily.  90 tablet  0  . venlafaxine XR (EFFEXOR-XR) 75 MG 24 hr capsule Take 1 capsule (75 mg total) by mouth daily.  30 capsule  0  . vitamin C (ASCORBIC ACID) 500 MG tablet Take 500 mg by mouth daily.       Scheduled: . clonazePAM  0.5 mg Oral TID  . enoxaparin (LOVENOX) injection  40 mg Subcutaneous Q24H  . ondansetron  4 mg Oral Q4H   Or  . ondansetron (ZOFRAN) IV  4 mg Intravenous Q4H  . QUEtiapine  100 mg Oral TID   Continuous: . sodium chloride 50 mL/hr at 10/20/12 1236   KZS:[id number], acetaminophen, alum & mag hydroxide-simeth, HYDROmorphone (DILAUDID) injection, zolpidem Anti-infectives   None      Results for orders placed during the hospital encounter of 10/19/12 (from the past 48 hour(s))  CBC WITH DIFFERENTIAL     Status: Abnormal   Collection Time    10/19/12  8:42 AM      Result Value Range   WBC 9.9  4.0 - 10.5 K/uL   RBC 4.14 (*) 4.22 - 5.81 MIL/uL   Hemoglobin 13.7  13.0 - 17.0 g/dL   HCT 02.5  42.7 - 06.2 %   MCV 95.4  78.0 - 100.0 fL   MCH 33.1  26.0 - 34.0 pg   MCHC 34.7  30.0 - 36.0 g/dL   RDW 37.6  28.3 - 15.1 %   Platelets 206  150 - 400 K/uL   Neutrophils Relative % 80 (*) 43 - 77 %   Neutro Abs 7.9 (*) 1.7 - 7.7 K/uL   Lymphocytes Relative 11 (*) 12 - 46 %   Lymphs Abs 1.1  0.7 - 4.0 K/uL   Monocytes Relative 8  3 - 12 %   Monocytes Absolute 0.8  0.1 - 1.0 K/uL   Eosinophils Relative 0  0 - 5 %   Eosinophils Absolute 0.0  0.0 - 0.7 K/uL   Basophils Relative 0  0 - 1 %   Basophils Absolute 0.0  0.0 - 0.1 K/uL  COMPREHENSIVE METABOLIC PANEL     Status: Abnormal   Collection Time    10/19/12  8:42 AM      Result Value Range   Sodium 142  135 - 145 mEq/L   Potassium 3.9  3.5 - 5.1 mEq/L   Chloride 102  96 - 112 mEq/L   CO2 32  19 - 32 mEq/L   Glucose, Bld 127 (*) 70 - 99 mg/dL   BUN 11  6 - 23 mg/dL   Creatinine, Ser  7.61  0.50 - 1.35 mg/dL   Calcium 9.4  8.4 - 60.7 mg/dL   Total Protein 7.3  6.0 - 8.3 g/dL   Albumin 4.0  3.5 - 5.2 g/dL   AST 26  0 - 37 U/L   ALT 24  0 - 53 U/L   Alkaline Phosphatase 117  39 -  117 U/L   Total Bilirubin 0.4  0.3 - 1.2 mg/dL   GFR calc non Af Amer 54 (*) >90 mL/min   GFR calc Af Amer 62 (*) >90 mL/min   Comment:            The eGFR has been calculated     using the CKD EPI equation.     This calculation has not been     validated in all clinical     situations.     eGFR's persistently     <90 mL/min signify     possible Chronic Kidney Disease.  LIPASE, BLOOD     Status: None   Collection Time    10/19/12  8:42 AM      Result Value Range   Lipase 33  11 - 59 U/L  URINALYSIS, ROUTINE W REFLEX MICROSCOPIC     Status: None   Collection Time    10/19/12 10:04 AM      Result Value Range   Color, Urine YELLOW  YELLOW   APPearance CLEAR  CLEAR   Specific Gravity, Urine 1.010  1.005 - 1.030   pH 8.0  5.0 - 8.0   Glucose, UA NEGATIVE  NEGATIVE mg/dL   Hgb urine dipstick NEGATIVE  NEGATIVE   Bilirubin Urine NEGATIVE  NEGATIVE   Ketones, ur NEGATIVE  NEGATIVE mg/dL   Protein, ur NEGATIVE  NEGATIVE mg/dL   Urobilinogen, UA 0.2  0.0 - 1.0 mg/dL   Nitrite NEGATIVE  NEGATIVE   Leukocytes, UA NEGATIVE  NEGATIVE   Comment: MICROSCOPIC NOT DONE ON URINES WITH NEGATIVE PROTEIN, BLOOD, LEUKOCYTES, NITRITE, OR GLUCOSE <1000 mg/dL.  COMPREHENSIVE METABOLIC PANEL     Status: Abnormal   Collection Time    10/20/12  7:06 AM      Result Value Range   Sodium 134 (*) 135 - 145 mEq/L   Comment: DELTA CHECK NOTED   Potassium 4.2  3.5 - 5.1 mEq/L   Chloride 101  96 - 112 mEq/L   CO2 26  19 - 32 mEq/L   Glucose, Bld 116 (*) 70 - 99 mg/dL   BUN 7  6 - 23 mg/dL   Creatinine, Ser 1.61  0.50 - 1.35 mg/dL   Calcium 8.4  8.4 - 09.6 mg/dL   Total Protein 6.9  6.0 - 8.3 g/dL   Albumin 3.5  3.5 - 5.2 g/dL   AST 24  0 - 37 U/L   ALT 20  0 - 53 U/L   Alkaline Phosphatase 111  39 -  117 U/L   Total Bilirubin 0.5  0.3 - 1.2 mg/dL   GFR calc non Af Amer 78 (*) >90 mL/min   GFR calc Af Amer >90  >90 mL/min   Comment:            The eGFR has been calculated     using the CKD EPI equation.     This calculation has not been     validated in all clinical     situations.     eGFR's persistently     <90 mL/min signify     possible Chronic Kidney Disease.  CBC     Status: Abnormal   Collection Time    10/20/12  7:06 AM      Result Value Range   WBC 13.0 (*) 4.0 - 10.5 K/uL   RBC 4.19 (*) 4.22 - 5.81 MIL/uL   Hemoglobin 13.8  13.0 - 17.0 g/dL   HCT 04.5  39.0 - 52.0 %   MCV 96.7  78.0 - 100.0 fL   MCH 32.9  26.0 - 34.0 pg   MCHC 34.1  30.0 - 36.0 g/dL   RDW 16.1  09.6 - 04.5 %   Platelets 178  150 - 400 K/uL  GLUCOSE, CAPILLARY     Status: None   Collection Time    10/20/12  7:58 AM      Result Value Range   Glucose-Capillary 99  70 - 99 mg/dL   Comment 1 Notify RN    GLUCOSE, CAPILLARY     Status: Abnormal   Collection Time    10/20/12 12:06 PM      Result Value Range   Glucose-Capillary 101 (*) 70 - 99 mg/dL   Comment 1 Notify RN      Ct Abdomen Pelvis W Contrast  10/19/2012   **ADDENDUM** CREATED: 10/19/2012 15:13:52  The patient has apparently not had a cholecystectomy despite the history provided.  The gallbladder is not visualized on the ultrasound examination.  **END ADDENDUM** SIGNED BY: Cyndie Chime, M.D.  10/19/2012   **ADDENDUM** CREATED: 10/19/2012 15:03:34  Despite a previous report of recent cholecystectomy, provided on sonographic examination of 10/19/2012, the patient adamantly denies such history.  On the current examination, gallbladder is markedly decompressed.  **END ADDENDUM** SIGNED BY: Reyes Ivan, M.D.  10/19/2012   *RADIOLOGY REPORT*  Clinical Data: Years CT yesterday with removal of biliary stent. Mild abdominal pain and vomiting.  CT ABDOMEN AND PELVIS WITH CONTRAST  Technique:  Multidetector CT imaging of the abdomen and pelvis was  performed following the standard protocol during bolus administration of intravenous contrast.  Contrast: 50mL OMNIPAQUE IOHEXOL 300 MG/ML  SOLN, OMNIPAQUE IOHEXOL 300 MG/ML  SOLN  Comparison: 07/23/2012.  Findings: Lung bases show scattered scarring and calcified granulomas. Trace left pleural fluid.  Heart size normal.  No pericardial effusion.  There is apparent thickening of the lower esophageal wall which can be seen with gastroesophageal reflux disease.  Liver is unremarkable.  Interval cholecystectomy.  Haziness about the porta hepatis and extrahepatic bile duct likely relate to recent intervention performed yesterday with removal of the biliary stent.  Extrahepatic bile duct does not appear dilated.  Adrenal glands are unremarkable.  There may be a sub centimeter low attenuation lesion in the lower pole right kidney, too small to characterize.  Kidneys, spleen, pancreas, stomach and bowel are otherwise unremarkable.  Small bilateral inguinal hernias contain fat.  Bladder is distended with urine.  Prostate is enlarged.  No pathologically enlarged lymph nodes.  Atherosclerotic calcification of the arterial vasculature without abdominal aortic aneurysm.  No extraluminal contrast or air.  No worrisome lytic or sclerotic lesions.  IMPRESSION:  1.  Mild haziness in the porta hepatis and common bile duct region is likely reflective of recent ERCP and stent removal performed yesterday.  No evidence of free air. 2.  Trace left pleural fluid. 3.  Small bilateral inguinal hernias contain fat. 4.  Prostate enlargement.   Original Report Authenticated By: Leanna Battles, M.D.   Dg Abd Acute W/chest  10/19/2012   *RADIOLOGY REPORT*  Clinical Data: Abdominal pain, bloating and nausea.  ACUTE ABDOMEN SERIES (ABDOMEN 2 VIEW & CHEST 1 VIEW)  Comparison: Chest x-ray on 07/22/2012 and ERCP procedure yesterday.  Findings: The lungs show evidence of stable chronic disease.  No acute edema or infiltrate is identified.   Heart size is normal.  Abdominal films show no evidence of bowel obstruction, significant ileus  or free intraperitoneal air.  There is some loss of contrast in the colon likely related to the contrast injected at the time of ERCP yesterday.  No abnormal calcifications are seen.  Degenerative changes are present in the lower lumbar spine.  IMPRESSION: No acute findings.  Stable chronic lung disease.   Original Report Authenticated By: Irish Lack, M.D.   US Abdomen Limited Ruq  10/19/2012   *RADIOLOGY REPORT*  Clinical Data:  History of cholecystectomy 8 weeks ago.  Removal of common bile duct stent.  LIMITED ABDOMINAL ULTRASOUND - RIGHT UPPER QUADRANT  Comparison:  07/25/2012.  Findings:  Gallbladder:  Surgically absent.  Common bile duct:  Normal in caliber measuring a maximum of 4.7mm.  Liver:  Mild diffuse fatty infiltration.  The intrahepatic biliary dilatation is much improved.  A 7 mm echogenic lesion in the right lobe is most likely a small hemangioma.  IMPRESSION:  1.  Status post cholecystectomy and common bile duct stent removal. 2.  No common bile duct dilatation and improved intrahepatic biliary dilatation. 3.  Mild diffuse fatty infiltration of the liver and probable small, 7 mm, right hepatic lobe hemangioma.                    Original Report Authenticated By: Rudie Meyer, M.D.    Review of Systems  Constitutional: Negative.   HENT: Negative.   Eyes: Negative.   Respiratory: Negative.   Cardiovascular: Negative.   Gastrointestinal: Positive for nausea and abdominal pain (now resolved). Negative for vomiting.  Genitourinary: Negative.   Musculoskeletal: Negative.   Skin: Negative.   Neurological: Negative.   Endo/Heme/Allergies: Negative.   Psychiatric/Behavioral: Negative.    Blood pressure 118/66, pulse 80, temperature 98.8 F (37.1 C), temperature source Oral, resp. rate 20, height 6\' 3"  (1.905 m), weight 83.9 kg (184 lb 15.5 oz), SpO2 100.00%. Physical Exam   Constitutional: He is oriented to person, place, and time. He appears well-developed and well-nourished. No distress.  Slightly disheveled  HENT:  Head: Normocephalic and atraumatic.  Eyes: Conjunctivae and EOM are normal. Pupils are equal, round, and reactive to light. No scleral icterus.  Neck: Normal range of motion. Neck supple. No tracheal deviation present. No thyromegaly present.  Cardiovascular: Normal rate, regular rhythm and normal heart sounds.   Respiratory: Effort normal and breath sounds normal. No respiratory distress.  GI: Soft. Bowel sounds are normal. He exhibits no distension and no mass. There is no tenderness. There is no rebound and no guarding.  Lymphadenopathy:    He has no cervical adenopathy.  Neurological: He is alert and oriented to person, place, and time.  Skin: Skin is warm and dry.    Assessment/Plan: History of biliary stones/cholelithiasis. Clinically patient is doing well today. I do feel it is reasonable to advance his diet and see if he tolerates this. As his workup has demonstrated a very small contracted gallbladder I'm not sure all the symptoms are necessarily coming from the gallbladder source although I have discussed addressing the gallbladder with the patient. He is adamant he wishes to pursue this as an outpatient if possible. Given patient's current course and current presentation I didn't think this is reasonable as long as she is able to tolerate a regular diet without symptomatology. I will continue to follow his course either as an inpatient or out patient.    Luann Aspinwall C 10/20/2012, 4:07 PM

## 2012-10-20 NOTE — Progress Notes (Signed)
Patient tolerated dinner without any complication. States understanding of discharge instructions.

## 2012-10-20 NOTE — Discharge Instructions (Signed)
Cholelithiasis Cholelithiasis (also called gallstones) is a form of gallbladder disease where gallstones form in your gallbladder. The gallbladder is a non-essential organ that stores bile made in the liver, which helps digest fats. Gallstones begin as small crystals and slowly grow into stones. Gallstone pain occurs when the gallbladder spasms, and a gallstone is blocking the duct. Pain can also occur when a stone passes out of the duct.  Women are more likely to develop gallstones than men. Other factors that increase the risk of gallbladder disease are:  Having multiple pregnancies. Physicians sometimes advise removing diseased gallbladders before future pregnancies.  Obesity.  Diets heavy in fried foods and fat.  Increasing age (older than 60).  Prolonged use of medications containing male hormones.  Diabetes mellitus.  Rapid weight loss.  Family history of gallstones (heredity). SYMPTOMS  Feeling sick to your stomach (nauseous).  Abdominal pain.  Yellowing of the skin (jaundice).  Sudden pain. It may persist from several minutes to several hours.  Worsening pain with deep breathing or when jarred.  Fever.  Tenderness to the touch. In some cases, when gallstones do not move into the bile duct, people have no pain or symptoms. These are called "silent" gallstones. TREATMENT In severe cases, emergency surgery may be required. HOME CARE INSTRUCTIONS   Only take over-the-counter or prescription medicines for pain, discomfort, or fever as directed by your caregiver.  Follow a low-fat diet until seen again. Fat causes the gallbladder to contract, which can result in pain.  Follow up as instructed. Attacks are almost always recurrent and surgery is usually required for permanent treatment. SEEK IMMEDIATE MEDICAL CARE IF:   Your pain increases and is not controlled by medications.  You have an oral temperature above 102 F (38.9 C), not controlled by medication.  You  develop nausea and vomiting. MAKE SURE YOU:   Understand these instructions.  Will watch your condition.  Will get help right away if you are not doing well or get worse. Document Released: 04/28/2005 Document Revised: 07/25/2011 Document Reviewed: 07/01/2010 [employer] Patient Information 2014 [employer].  

## 2012-10-23 ENCOUNTER — Encounter (HOSPITAL_COMMUNITY): Payer: Self-pay | Admitting: Emergency Medicine

## 2012-10-23 ENCOUNTER — Inpatient Hospital Stay (HOSPITAL_COMMUNITY)
Admission: EM | Admit: 2012-10-23 | Discharge: 2012-10-27 | DRG: 418 | Disposition: A | Discharge status: Home / Self Care | Payer: Medicare Other | Attending: Internal Medicine | Admitting: Internal Medicine

## 2012-10-23 ENCOUNTER — Emergency Department (HOSPITAL_COMMUNITY): Payer: Medicare Other

## 2012-10-23 DIAGNOSIS — K8020 Calculus of gallbladder without cholecystitis without obstruction: Principal | ICD-10-CM | POA: Diagnosis present

## 2012-10-23 DIAGNOSIS — Z66 Do not resuscitate: Secondary | ICD-10-CM | POA: Diagnosis present

## 2012-10-23 DIAGNOSIS — R945 Abnormal results of liver function studies: Secondary | ICD-10-CM

## 2012-10-23 DIAGNOSIS — E440 Moderate protein-calorie malnutrition: Secondary | ICD-10-CM | POA: Diagnosis present

## 2012-10-23 DIAGNOSIS — F17200 Nicotine dependence, unspecified, uncomplicated: Secondary | ICD-10-CM | POA: Diagnosis present

## 2012-10-23 DIAGNOSIS — R110 Nausea: Secondary | ICD-10-CM | POA: Diagnosis present

## 2012-10-23 DIAGNOSIS — R112 Nausea with vomiting, unspecified: Secondary | ICD-10-CM

## 2012-10-23 DIAGNOSIS — IMO0002 Reserved for concepts with insufficient information to code with codable children: Secondary | ICD-10-CM

## 2012-10-23 DIAGNOSIS — R109 Unspecified abdominal pain: Secondary | ICD-10-CM | POA: Diagnosis present

## 2012-10-23 DIAGNOSIS — K5900 Constipation, unspecified: Secondary | ICD-10-CM | POA: Diagnosis present

## 2012-10-23 DIAGNOSIS — H9190 Unspecified hearing loss, unspecified ear: Secondary | ICD-10-CM | POA: Diagnosis present

## 2012-10-23 DIAGNOSIS — R1110 Vomiting, unspecified: Secondary | ICD-10-CM | POA: Diagnosis present

## 2012-10-23 DIAGNOSIS — G4700 Insomnia, unspecified: Secondary | ICD-10-CM | POA: Diagnosis present

## 2012-10-23 DIAGNOSIS — E878 Other disorders of electrolyte and fluid balance, not elsewhere classified: Secondary | ICD-10-CM | POA: Diagnosis present

## 2012-10-23 DIAGNOSIS — F411 Generalized anxiety disorder: Secondary | ICD-10-CM | POA: Diagnosis present

## 2012-10-23 DIAGNOSIS — R319 Hematuria, unspecified: Secondary | ICD-10-CM | POA: Diagnosis not present

## 2012-10-23 DIAGNOSIS — K922 Gastrointestinal hemorrhage, unspecified: Secondary | ICD-10-CM

## 2012-10-23 DIAGNOSIS — D638 Anemia in other chronic diseases classified elsewhere: Secondary | ICD-10-CM | POA: Diagnosis present

## 2012-10-23 DIAGNOSIS — F3289 Other specified depressive episodes: Secondary | ICD-10-CM | POA: Diagnosis present

## 2012-10-23 DIAGNOSIS — E870 Hyperosmolality and hypernatremia: Secondary | ICD-10-CM | POA: Diagnosis present

## 2012-10-23 DIAGNOSIS — E860 Dehydration: Secondary | ICD-10-CM | POA: Diagnosis present

## 2012-10-23 DIAGNOSIS — F329 Major depressive disorder, single episode, unspecified: Secondary | ICD-10-CM | POA: Diagnosis present

## 2012-10-23 DIAGNOSIS — R7989 Other specified abnormal findings of blood chemistry: Secondary | ICD-10-CM

## 2012-10-23 DIAGNOSIS — E876 Hypokalemia: Secondary | ICD-10-CM | POA: Diagnosis not present

## 2012-10-23 DIAGNOSIS — D72829 Elevated white blood cell count, unspecified: Secondary | ICD-10-CM | POA: Diagnosis present

## 2012-10-23 LAB — APTT: aPTT: 25 seconds (ref 24–37)

## 2012-10-23 LAB — URINALYSIS, ROUTINE W REFLEX MICROSCOPIC
Bilirubin Urine: NEGATIVE
Glucose, UA: NEGATIVE mg/dL
Hgb urine dipstick: NEGATIVE
Ketones, ur: 15 mg/dL — AB
Protein, ur: NEGATIVE mg/dL
pH: 7 (ref 5.0–8.0)

## 2012-10-23 LAB — COMPREHENSIVE METABOLIC PANEL
ALT: 16 U/L (ref 0–53)
AST: 19 U/L (ref 0–37)
BUN: 35 mg/dL — ABNORMAL HIGH (ref 6–23)
CO2: 29 mEq/L (ref 19–32)
Calcium: 9.2 mg/dL (ref 8.4–10.5)
Creatinine, Ser: 1.26 mg/dL (ref 0.50–1.35)
GFR calc Af Amer: 61 mL/min — ABNORMAL LOW (ref >90)
GFR calc non Af Amer: 53 mL/min — ABNORMAL LOW (ref >90)
Glucose, Bld: 137 mg/dL — ABNORMAL HIGH (ref 70–99)
Sodium: 146 mEq/L — ABNORMAL HIGH (ref 135–145)
Total Bilirubin: 0.3 mg/dL (ref 0.3–1.2)

## 2012-10-23 LAB — CBC WITH DIFFERENTIAL/PLATELET
Eosinophils Absolute: 0 10*3/uL (ref 0.0–0.7)
Eosinophils Relative: 0 % (ref 0–5)
HCT: 37.9 % — ABNORMAL LOW (ref 39.0–52.0)
Hemoglobin: 12.9 g/dL — ABNORMAL LOW (ref 13.0–17.0)
Lymphocytes Relative: 5 % — ABNORMAL LOW (ref 12–46)
Lymphs Abs: 0.8 10*3/uL (ref 0.7–4.0)
MCH: 32.7 pg (ref 26.0–34.0)
MCHC: 34 g/dL (ref 30.0–36.0)
MCV: 95.9 fL (ref 78.0–100.0)
Monocytes Absolute: 1.4 10*3/uL — ABNORMAL HIGH (ref 0.1–1.0)
Monocytes Relative: 8 % (ref 3–12)
Neutrophils Relative %: 87 % — ABNORMAL HIGH (ref 43–77)
RBC: 3.95 MIL/uL — ABNORMAL LOW (ref 4.22–5.81)
WBC: 16.7 10*3/uL — ABNORMAL HIGH (ref 4.0–10.5)

## 2012-10-23 LAB — PROTIME-INR: INR: 1.07 (ref 0.00–1.49)

## 2012-10-23 LAB — OCCULT BLOOD, POC DEVICE: Fecal Occult Bld: NEGATIVE

## 2012-10-23 MED ORDER — ONDANSETRON HCL 4 MG PO TABS
4.0000 mg | ORAL_TABLET | Freq: Four times a day (QID) | ORAL | Status: DC | PRN
  Administered 2012-10-23 – 2012-10-24 (×2): 4 mg via ORAL
  Filled 2012-10-23: qty 1

## 2012-10-23 MED ORDER — ACETAMINOPHEN 650 MG RE SUPP: 650.0000 mg | Freq: Four times a day (QID) | RECTAL | Status: DC | PRN

## 2012-10-23 MED ORDER — SODIUM CHLORIDE 0.9 % IV BOLUS (SEPSIS)
1000.0000 mL | Freq: Once | INTRAVENOUS | Status: AC
  Administered 2012-10-23: 1000 mL via INTRAVENOUS

## 2012-10-23 MED ORDER — DEXTROSE 5 % IV SOLN
1.0000 g | INTRAVENOUS | Status: DC
  Administered 2012-10-23 – 2012-10-26 (×4): 1 g via INTRAVENOUS
  Filled 2012-10-23 (×6): qty 10

## 2012-10-23 MED ORDER — ACETAMINOPHEN 325 MG PO TABS
650.0000 mg | ORAL_TABLET | Freq: Four times a day (QID) | ORAL | Status: DC | PRN
  Administered 2012-10-27: 650 mg via ORAL
  Filled 2012-10-23 (×2): qty 2

## 2012-10-23 MED ORDER — ONDANSETRON HCL 4 MG/2ML IJ SOLN
INTRAMUSCULAR | Status: AC
  Filled 2012-10-23: qty 2

## 2012-10-23 MED ORDER — MILK AND MOLASSES ENEMA: Freq: Once | RECTAL | Status: DC

## 2012-10-23 MED ORDER — ONDANSETRON HCL 4 MG/2ML IJ SOLN: 4.0000 mg | Freq: Four times a day (QID) | INTRAMUSCULAR | Status: DC | PRN

## 2012-10-23 MED ORDER — POTASSIUM CHLORIDE IN NACL 20-0.45 MEQ/L-% IV SOLN
INTRAVENOUS | Status: DC
  Administered 2012-10-24 – 2012-10-26 (×4): via INTRAVENOUS
  Filled 2012-10-23 (×15): qty 1000

## 2012-10-23 MED ORDER — ONDANSETRON HCL 4 MG/2ML IJ SOLN
4.0000 mg | Freq: Once | INTRAMUSCULAR | Status: AC
  Administered 2012-10-23: 4 mg via INTRAVENOUS

## 2012-10-23 MED ORDER — CLONAZEPAM 0.5 MG PO TABS
0.5000 mg | ORAL_TABLET | Freq: Three times a day (TID) | ORAL | Status: DC
  Administered 2012-10-23 – 2012-10-27 (×10): 0.5 mg via ORAL
  Filled 2012-10-23 (×11): qty 1

## 2012-10-23 MED ORDER — ONDANSETRON HCL 4 MG/2ML IJ SOLN
4.0000 mg | Freq: Three times a day (TID) | INTRAMUSCULAR | Status: AC | PRN
  Administered 2012-10-23: 4 mg via INTRAVENOUS
  Filled 2012-10-23 (×2): qty 2

## 2012-10-23 MED ORDER — MORPHINE SULFATE 2 MG/ML IJ SOLN
1.0000 mg | INTRAMUSCULAR | Status: DC | PRN
  Administered 2012-10-23: 1 mg via INTRAVENOUS
  Filled 2012-10-23: qty 1

## 2012-10-23 MED ORDER — SODIUM CHLORIDE 0.9 % IV SOLN: INTRAVENOUS | Status: DC

## 2012-10-23 MED ORDER — QUETIAPINE FUMARATE 100 MG PO TABS
100.0000 mg | ORAL_TABLET | Freq: Three times a day (TID) | ORAL | Status: DC
  Administered 2012-10-24 – 2012-10-27 (×9): 100 mg via ORAL
  Filled 2012-10-23 (×9): qty 1

## 2012-10-23 MED ORDER — POTASSIUM CHLORIDE IN NACL 20-0.45 MEQ/L-% IV SOLN
INTRAVENOUS | Status: AC
  Filled 2012-10-23: qty 2000

## 2012-10-23 MED ORDER — PANTOPRAZOLE SODIUM 40 MG IV SOLR
40.0000 mg | Freq: Two times a day (BID) | INTRAVENOUS | Status: DC
  Administered 2012-10-23 – 2012-10-27 (×7): 40 mg via INTRAVENOUS
  Filled 2012-10-23 (×7): qty 40

## 2012-10-23 MED ORDER — ONDANSETRON HCL 4 MG/2ML IJ SOLN
4.0000 mg | Freq: Once | INTRAMUSCULAR | Status: AC
  Administered 2012-10-23: 4 mg via INTRAVENOUS
  Filled 2012-10-23: qty 2

## 2012-10-23 MED ORDER — DEXTROSE 5 % IV SOLN
INTRAVENOUS | Status: AC
  Filled 2012-10-23: qty 10

## 2012-10-23 MED ORDER — PROMETHAZINE HCL 25 MG/ML IJ SOLN
12.5000 mg | Freq: Four times a day (QID) | INTRAMUSCULAR | Status: DC | PRN
  Administered 2012-10-23 – 2012-10-24 (×2): 12.5 mg via INTRAVENOUS
  Filled 2012-10-23 (×2): qty 1

## 2012-10-23 NOTE — ED Notes (Signed)
Pt states that he needs something for his stomach, states his stomach feels like it is "doing flip flops."  Pt states that he does feel like his nausea continues.

## 2012-10-23 NOTE — Progress Notes (Signed)
Pt began vomiting will give PRN dose of zofran IV and continue to monitor

## 2012-10-23 NOTE — ED Notes (Signed)
Friend at bedside states that the patient was complaining of nausea, see MAR.

## 2012-10-23 NOTE — ED Notes (Signed)
Diet remains NPO, oral care provided for patient.

## 2012-10-23 NOTE — Progress Notes (Signed)
MD paged about persistent nausea and vomiting, and pt refusing enema.  Telephone order was given for PRN phenergan. Will administer the medication and monitor the pt.

## 2012-10-23 NOTE — ED Notes (Signed)
Patient very disheveled, states that he lives alone.

## 2012-10-23 NOTE — ED Notes (Signed)
Cold washcloth placed on patients head.

## 2012-10-23 NOTE — H&P (Signed)
[facility] History and Physical  BALDWIN RACICOT UEA:[id number] DOB: 04-05-1932 DOA: 10/23/2012  Referring physician: Dr. Devoria Albe, ER physician PCP: Louie Boston, MD  Specialists: Gastroenterologist: Dr. Jena Gauss  Chief Complaint: Vomiting  HPI: Edward Trevino is a 79 y.o. male who was recently discharged from the hospital approximately 3 days ago when he was evaluated for abdominal pain and vomiting. Patient recently had an ERCP done by gastroenterology for bile duct stones as well as partial gastric outlet obstruction. He underwent pyloric channel auscultation as well as stone removal from bile duct. Biliary Stent had been placed and this was recently removed last week. Post-stent removal, he developed abdominal pain and vomiting and was admitted to the hospital. he was seen by general surgery and recommendations were to consider cholecystectomy. The patient wished to pursue this as an outpatient. He was told that if he would tolerate a solid food tray that he could discharge home and pursue this as an outpatient. The patient received his solid food tray, he threw the food in the trash and told nursing staff that he ate all of his food. He was discharged home to be a socially he was tolerating solid food. Upon returning home, he had recurrence of his symptoms of abdominal pain.  Patient has been unable to eat or drink anything in the past 3 days. He growing increasingly weak. He says that he may feel feverish. He's not had a bowel movement in 4 days. He does not report any flatus. He started to have vomiting last night which continued through this morning. He reports this is dark in color. He feels his abdomen is also somewhat distended. He did feel some relief after vomiting. Abdominal pain is mostly located on the right side, intermittent and crampy type pain.  Review of Systems: Pertinent positives as per history of present illness, otherwise negative  Past Medical History  Diagnosis Date   . Depression   . Insomnia   . Anxiety   . Suicide attempt   . Erosive esophagitis 07/25/2012  . Gastric out let obstruction 07/25/2012  . HOH (hard of hearing)    Past Surgical History  Procedure Laterality Date  . Appendectomy    . Back surgery    . Ercp N/A 07/24/2012    Dr. Jena Gauss. Significant abnormalities of the bowl and proximal second portion producing partial gastric outlet stricture and with secondary gastric dilation and severe erosive reflux esophagitis. Biopsy showed benign ulceration. Normal-appearing ampulla, status post biliary sphincterotomy and ampulla balloon dilation, stone extraction and stent placement.  Marland Kitchen Sphincterotomy N/A 07/24/2012    Procedure: SPHINCTEROTOMY;  Surgeon: Corbin Ade, MD;  Location: [facility];  Service: Endoscopy;  Laterality: N/A;  . Esophageal biopsy N/A 07/24/2012    Procedure: BIOPSY;  Surgeon: Corbin Ade, MD;  Location: [facility];  Service: Endoscopy;  Laterality: N/A;  Duodenal and Esophageal Biopsies  . Esophagogastroduodenoscopy N/A 07/24/2012    YNW:[id number] ampulla s/p biliary sphincterotomy and ampullary  . Balloon dilation N/A 07/24/2012    Procedure: BALLOON DILATION;  Surgeon: Corbin Ade, MD;  Location: [facility];  Service: Endoscopy;  Laterality: N/A;  Balloon Stone Extraction, Drudging  . Removal of stones N/A 07/24/2012    Procedure: REMOVAL OF STONES;  Surgeon: Corbin Ade, MD;  Location: [facility];  Service: Endoscopy;  Laterality: N/A;  . Egd/ercp  10/18/2012    Dr. Jena Gauss: ;yloric channel stenosis, s/p dilation and bx, persisting CBD stones with extension of sphincterotomy and sphincterotomy balloon  dilation and stone extraction. Removal of biliary stent  . Esophagogastroduodenoscopy (egd) with propofol N/A 10/18/2012    Procedure: ESOPHAGOGASTRODUODENOSCOPY (EGD) WITH PROPOFOL;  Surgeon: Corbin Ade, MD;  Location: [facility];  Service: Endoscopy;  Laterality: N/A;  . Ercp N/A 10/18/2012    Procedure: ENDOSCOPIC RETROGRADE  CHOLANGIOPANCREATOGRAPHY (ERCP);  Surgeon: Corbin Ade, MD;  Location: [facility];  Service: Endoscopy;  Laterality: N/A;  . Removal of stones N/A 10/18/2012    Procedure: REMOVAL OF STONES;  Surgeon: Corbin Ade, MD;  Location: [facility];  Service: Endoscopy;  Laterality: N/A;  . Esophageal biopsy N/A 10/18/2012    Procedure: BIOPSY;  Surgeon: Corbin Ade, MD;  Location: [facility];  Service: Endoscopy;  Laterality: N/A;   Social History:  reports that he has been smoking Cigars.  He does not have any smokeless tobacco history on file. He reports that he does not drink alcohol or use illicit drugs.   No Known Allergies  Family History  Problem Relation Age of Onset  . Colon cancer Neg Hx     Prior to Admission medications   Medication Sig Start Date End Date Taking? Authorizing Provider  clonazePAM (KLONOPIN) 0.5 MG tablet Take 1 tablet (0.5 mg total) by mouth 3 (three) times daily. Takes one tablet twice daily and 2 tablets at bedtime. 07/26/12  Yes Elliot Cousin, MD  mirtazapine (REMERON) 30 MG tablet Take 1 tablet (30 mg total) by mouth at bedtime. 07/26/12  Yes Elliot Cousin, MD  Multiple Vitamin (MULTIVITAMIN WITH MINERALS) TABS Take 1 tablet by mouth daily. 07/26/12  Yes Elliot Cousin, MD  QUEtiapine (SEROQUEL) 100 MG tablet Take 1 tablet (100 mg total) by mouth 3 (three) times daily. 07/26/12  Yes Elliot Cousin, MD  venlafaxine XR (EFFEXOR-XR) 75 MG 24 hr capsule Take 1 capsule (75 mg total) by mouth daily. 07/26/12  Yes Elliot Cousin, MD  vitamin C (ASCORBIC ACID) 500 MG tablet Take 500 mg by mouth daily.   Yes Historical Provider, MD   Physical Exam: Filed Vitals:   10/23/12 1535 10/23/12 1545 10/23/12 1600 10/23/12 1630  BP: 129/71 129/71 126/79 128/100  Pulse: 93 94 96 99  Temp:      TempSrc:      Resp: 34 16 17 20   Height:      Weight:      SpO2: 98% 100% 100% 98%     General: No acute distress   Eyes: Pupils are equal round reactive to light  ENT: Mucous membranes are  dry  Neck: Supple  Cardiovascular: S1, S2, regular rate and rhythm  Respiratory: Clear to auscultation bilaterally  Abdomen: Soft, mildly tender in the right upper quadrant and right lower corner, distended, bowel sounds are sluggish  Skin: No rashes  Musculoskeletal: No pedal edema bilaterally  Psychiatric: Normal affect, cooperative with exam  Neurologic: Grossly intact, nonfocal  Labs on Admission:  Basic Metabolic Panel:  Recent Labs Lab 10/19/12 0842 10/20/12 0706 10/23/12 0941  NA 142 134* 146*  K 3.9 4.2 3.4*  CL 102 101 104  CO2 32 26 29  GLUCOSE 127* 116* 137*  BUN 11 7 35*  CREATININE 1.24 0.94 1.26  CALCIUM 9.4 8.4 9.2   Liver Function Tests:  Recent Labs Lab 10/19/12 0842 10/20/12 0706 10/23/12 0941  AST 26 24 19   ALT 24 20 16   ALKPHOS 117 111 103  BILITOT 0.4 0.5 0.3  PROT 7.3 6.9 7.0  ALBUMIN 4.0 3.5 3.6    Recent Labs Lab  10/19/12 0842  LIPASE 33   No results found for this basename: AMMONIA,  in the last 168 hours CBC:  Recent Labs Lab 10/19/12 0842 10/20/12 0706 10/23/12 0941  WBC 9.9 13.0* 16.7*  NEUTROABS 7.9*  --  14.5*  HGB 13.7 13.8 12.9*  HCT 39.5 40.5 37.9*  MCV 95.4 96.7 95.9  PLT 206 178 222   Cardiac Enzymes: No results found for this basename: CKTOTAL, CKMB, CKMBINDEX, TROPONINI,  in the last 168 hours  BNP (last 3 results) No results found for this basename: PROBNP,  in the last 8760 hours CBG:  Recent Labs Lab 10/20/12 0758 10/20/12 1206  GLUCAP 99 101*    Radiological Exams on Admission: Dg Abd Acute W/chest  10/23/2012   *RADIOLOGY REPORT*  Clinical Data: Abdominal pain, emesis, and weakness  ACUTE ABDOMEN SERIES (ABDOMEN 2 VIEW & CHEST 1 VIEW)  Comparison: Acute abdominal series 10/19/2012 and CT abdomen pelvis 10/19/2012 and CT chest 02/07/2010  Findings: There is chronic pleuroparenchymal scarring and volume loss at the lung apices.  Chronic peribronchial thickening/bronchiectasis is noted at the  right lung base. Calcified granuloma left lung base.  There is a small right pleural effusion.  This appears new.  No evidence of free intraperitoneal air.  Oral contrast material is seen throughout the nondistended colon. This contrast is likely from the recent CT abdomen pelvis dated 10/19/2012.  There are no dilated small bowel loops.  Pneumobilia is noted.  The patient has a history of sphincterotomy and March 2014 per electronic medical records, which likely explains the pneumobilia.  Calcified phleboliths are noted in the right aspect of the pelvis.  No acute osseous abnormality identified.  IMPRESSION:  1.  Nonobstructive bowel gas pattern. 2.  Pneumobilia.  The patient has a history of sphincterotomy, which likely explains this finding. 3.  Small right pleural effusion is new ( was not present on CT abdomen pelvis of 10/19/2012). 4.  Chronic pleuroparenchymal scarring at the lung apices and chronic peribronchial thickening / bronchiectasis at the right lung base.   Original Report Authenticated By: Britta Mccreedy, M.D.    Assessment/Plan Active Problems:   Gallstones   Abdominal pain   Nausea   Vomiting   Dehydration   Hypernatremia   Constipation   Hypokalemia   1. Abdominal pain with vomiting. This is similar to symptoms that he had on his previous admission. At that time there was concern that he may have a gallbladder pathology and cholecystectomy was recommended. Patient wished to pursue this as an outpatient, but clearly his symptoms won't allow that. He'll be admitted to a MedSurg bed. We will consult Gen. surgery for further recommendations. Continue the patient on clear liquids for now. Provide antiemetics as needed. 2. Constipation/ileus. Acute abdominal series did not indicate any signs of small bowel obstruction. He likely has developed some degree of constipation/ileus. We'll provide milk and molasses enema. 3. Dehydration. Provide IV fluids. 4. Hypernatremia/hypokalemia. Will  correct with IV fluids. 5. Leukocytosis.  Will check urinalysis.  May be related to gall bladder.  Will start empiric rocephin  6. Depression. Continue seroquel and klonopin 7. Vomiting coffee ground material.  Patient has known esophageal erosions.  May have developed mallory weiss tear while repeated episodes of emesis.  Start ppi and monitor hemoglobin closely.  If vomiting recurs, may need to call GI again.  FOBT in ED was negative   Code Status: DNR Family Communication: Discussed with patient and children at the bedside Disposition Plan: pending hospital course  Time spent:  Aivy Akter [facility] Pager [phone]  If 7PM-7AM, please contact night-coverage [email or web address] Password [facility] 10/23/2012, 6:34 PM

## 2012-10-23 NOTE — ED Notes (Signed)
Patient was bed bathed and repositioned in bed. Patient states he does not need anything at this time.

## 2012-10-23 NOTE — Progress Notes (Addendum)
Patient is refusing milk and molasses enema at this time due to nausea and vomiting. I will try again in an hour.

## 2012-10-23 NOTE — ED Notes (Signed)
Patient brought in via EMS from home. Airway patent. No acute distress noted. Patient c/o abd pain with diarrhea. Patient recently admitted here after bile stent removed. Per EMS patient actively "projectale" vomiting dark emesis.

## 2012-10-23 NOTE — ED Provider Notes (Signed)
History  This chart was scribed for Edward Givens, MD by Edward Trevino, ED Scribe. This patient was seen in room [facility]15[facility] and the patient's care was started at 9:00 AM.  CSN: [id number]  Arrival date & time 10/23/12  0850   First MD Initiated Contact with Patient 10/23/12 0900      Chief Complaint  Patient presents with  . Abdominal Pain  . Emesis   Level 5 Caveat-AMS  The history is provided by the patient. No language interpreter was used.    HPI Comments: Edward Trevino is a 79 y.o. male brought in by ambulance, who presents to the Emergency Department complaining of diffuse abdominal pain with 3 associated episodes of emesis described as "dark brown". Per EMS, pt was "projectile" vomiting dark emesis. He was recently admitted after bile stent removal 4 days ago and discharged 3 days ago after eating a small meal. Pt originally thought that he had been discharged over one week ago. He reports that he was advised to have cholecystectomy surgery but he declined. Pt states that he is still having diffuse abdominal pain. Pt states that he went to church yesterday and was unable to eat any food for homecoming. Pt states that he feels nauseated currently. He denies having a BM in the past 3 days. He denies dizziness and lightheadedness as associated symptoms.  I evaluated the pt 4 days ago for the same and he was more alert and oriented than currently. Pt c/o diffuse abdominal pain with associated nausea. He had been seen the day before on 10/18/12 for a bile stent removal that had been placed 8 weeks before. He had been doing well until 10/19/12. He had a largely negative radiology work-up and was admitted for vomiting.  PCP Dr Margo Common GI Dr Jena Gauss  Past Medical History  Diagnosis Date  . Depression   . Insomnia   . Anxiety   . Suicide attempt   . Erosive esophagitis 07/25/2012  . Gastric out let obstruction 07/25/2012  . HOH (hard of hearing)     Past Surgical History  Procedure  Laterality Date  . Appendectomy    . Back surgery    . Ercp N/A 07/24/2012    Dr. Jena Gauss. Significant abnormalities of the bowl and proximal second portion producing partial gastric outlet stricture and with secondary gastric dilation and severe erosive reflux esophagitis. Biopsy showed benign ulceration. Normal-appearing ampulla, status post biliary sphincterotomy and ampulla balloon dilation, stone extraction and stent placement.  Marland Kitchen Sphincterotomy N/A 07/24/2012    Procedure: SPHINCTEROTOMY;  Surgeon: Corbin Ade, MD;  Location: [facility];  Service: Endoscopy;  Laterality: N/A;  . Esophageal biopsy N/A 07/24/2012    Procedure: BIOPSY;  Surgeon: Corbin Ade, MD;  Location: [facility];  Service: Endoscopy;  Laterality: N/A;  Duodenal and Esophageal Biopsies  . Esophagogastroduodenoscopy N/A 07/24/2012    WUJ:[id number] ampulla s/p biliary sphincterotomy and ampullary  . Balloon dilation N/A 07/24/2012    Procedure: BALLOON DILATION;  Surgeon: Corbin Ade, MD;  Location: [facility];  Service: Endoscopy;  Laterality: N/A;  Balloon Stone Extraction, Drudging  . Removal of stones N/A 07/24/2012    Procedure: REMOVAL OF STONES;  Surgeon: Corbin Ade, MD;  Location: [facility];  Service: Endoscopy;  Laterality: N/A;  . Egd/ercp  10/18/2012    Dr. Jena Gauss: ;yloric channel stenosis, s/p dilation and bx, persisting CBD stones with extension of sphincterotomy and sphincterotomy balloon dilation and stone extraction. Removal of biliary stent  . Esophagogastroduodenoscopy (  egd) with propofol N/A 10/18/2012    Procedure: ESOPHAGOGASTRODUODENOSCOPY (EGD) WITH PROPOFOL;  Surgeon: Corbin Ade, MD;  Location: [facility];  Service: Endoscopy;  Laterality: N/A;  . Ercp N/A 10/18/2012    Procedure: ENDOSCOPIC RETROGRADE CHOLANGIOPANCREATOGRAPHY (ERCP);  Surgeon: Corbin Ade, MD;  Location: [facility];  Service: Endoscopy;  Laterality: N/A;  . Removal of stones N/A 10/18/2012    Procedure: REMOVAL OF STONES;  Surgeon: Corbin Ade, MD;  Location: [facility];  Service: Endoscopy;  Laterality: N/A;  . Esophageal biopsy N/A 10/18/2012    Procedure: BIOPSY;  Surgeon: Corbin Ade, MD;  Location: [facility];  Service: Endoscopy;  Laterality: N/A;    Family History  Problem Relation Age of Onset  . Colon cancer Neg Hx     History  Substance Use Topics  . Smoking status: Current Every Day Smoker    Types: Cigars  . Smokeless tobacco: Not on file  . Alcohol Use: No     Comment: reported from ED son stated he has been drinking a lot of wine recently  lives by himself Has a neighbor that acts as caregiver    Review of Systems  Unable to perform ROS: Mental status change    Allergies  Review of patient's allergies indicates no known allergies.  Home Medications   Current Outpatient Rx  Name  Route  Sig  Dispense  Refill  . clonazePAM (KLONOPIN) 0.5 MG tablet   Oral   Take 1 tablet (0.5 mg total) by mouth 3 (three) times daily. Takes one tablet twice daily and 2 tablets at bedtime.   90 tablet   0   . mirtazapine (REMERON) 30 MG tablet   Oral   Take 1 tablet (30 mg total) by mouth at bedtime.   30 tablet   0   . Multiple Vitamin (MULTIVITAMIN WITH MINERALS) TABS   Oral   Take 1 tablet by mouth daily.         . QUEtiapine (SEROQUEL) 100 MG tablet   Oral   Take 1 tablet (100 mg total) by mouth 3 (three) times daily.   90 tablet   0   . venlafaxine XR (EFFEXOR-XR) 75 MG 24 hr capsule   Oral   Take 1 capsule (75 mg total) by mouth daily.   30 capsule   0   . vitamin C (ASCORBIC ACID) 500 MG tablet   Oral   Take 500 mg by mouth daily.           Triage Vitals: BP 111/69  Pulse 101  Temp(Src) 98.1 F (36.7 C) (Oral)  Resp 18  Ht 6\' 3"  (1.905 m)  Wt 184 lb (83.462 kg)  BMI 23 kg/m2  SpO2 98%  Vital signs normal except tachycardia   Physical Exam  Nursing note and vitals reviewed. Constitutional: He appears well-developed and well-nourished.  Non-toxic appearance. He does not  appear ill. No distress.  Dark brown streaking on legs and clothing  HENT:  Head: Normocephalic and atraumatic.  Right Ear: External ear normal.  Left Ear: External ear normal.  Nose: Nose normal. No mucosal edema or rhinorrhea.  Mouth/Throat: Oropharynx is clear and moist and mucous membranes are normal. No dental abscesses or edematous.  Dark brown material around mouth that looks like coffee grounds  Eyes: Conjunctivae and EOM are normal. Pupils are equal, round, and reactive to light.  Neck: Normal range of motion and full passive range of motion without pain. Neck supple.  Cardiovascular:  Normal rate, regular rhythm and normal heart sounds.  Exam reveals no gallop and no friction rub.   No murmur heard. Pulmonary/Chest: Effort normal and breath sounds normal. No respiratory distress. He has no wheezes. He has no rhonchi. He has no rales. He exhibits no tenderness and no crepitus.  Abdominal: Soft. Normal appearance and bowel sounds are normal. He exhibits no distension. There is tenderness (diffuse with no localization ). There is no rebound and no guarding.  Genitourinary: Guaiac negative stool.  No gross blood, no hemorrhoids, brown stool in the vault, chaperone present  Musculoskeletal: Normal range of motion. He exhibits no edema and no tenderness.  Moves all extremities well.   Neurological: He is alert. He has normal strength. No cranial nerve deficit.  Skin: Skin is warm, dry and intact. Rash noted. No erythema. No pallor.  Psychiatric: He has a normal mood and affect. His speech is normal and behavior is normal. His mood appears not anxious.    ED Course  Procedures (including critical care time)  Medications  sodium chloride 0.9 % bolus 1,000 mL (0 mLs Intravenous Stopped 10/23/12 1029)  ondansetron (ZOFRAN) injection 4 mg (4 mg Intravenous Given 10/23/12 0934)  ondansetron (ZOFRAN) injection 4 mg (4 mg Intravenous Given 10/23/12 1138)    DIAGNOSTIC STUDIES: Oxygen  Saturation is 98% on room air, normal by my interpretation.    COORDINATION OF CARE: 9:28 AM-Discussed treatment plan which includes medications, CBC panel, CMP and INR with pt at bedside and pt agreed to plan.   Review of chart shows in March he was admitted to [facility] for depression and SI, has tried to shot himself in the past.   12:39 PM-Pt rechecked and feels improved with medications listed above. He states he still feels like there's something in his abdomen that "needs to come out". He denies feeling that he is constipated. He puts his hand on his left mid abdomen. We'll get acute abdominal series. Patient has already had CT scans, ultrasound recently patient has had no further episode of vomiting in the ED. He is actually asking to eat.  15:34 Dr Kerry Hough, admit to med-surg, team 2, remembers patient, they had offered to have him see surgery to have his cholecystectomy, but patient refused, wanting to do it as an outpatient.   Results for orders placed during the hospital encounter of 10/23/12  CBC WITH DIFFERENTIAL      Result Value Range   WBC 16.7 (*) 4.0 - 10.5 K/uL   RBC 3.95 (*) 4.22 - 5.81 MIL/uL   Hemoglobin 12.9 (*) 13.0 - 17.0 g/dL   HCT 16.1 (*) 09.6 - 04.5 %   MCV 95.9  78.0 - 100.0 fL   MCH 32.7  26.0 - 34.0 pg   MCHC 34.0  30.0 - 36.0 g/dL   RDW 40.9  81.1 - 91.4 %   Platelets 222  150 - 400 K/uL   Neutrophils Relative % 87 (*) 43 - 77 %   Neutro Abs 14.5 (*) 1.7 - 7.7 K/uL   Lymphocytes Relative 5 (*) 12 - 46 %   Lymphs Abs 0.8  0.7 - 4.0 K/uL   Monocytes Relative 8  3 - 12 %   Monocytes Absolute 1.4 (*) 0.1 - 1.0 K/uL   Eosinophils Relative 0  0 - 5 %   Eosinophils Absolute 0.0  0.0 - 0.7 K/uL   Basophils Relative 0  0 - 1 %   Basophils Absolute 0.0  0.0 - 0.1 K/uL  COMPREHENSIVE METABOLIC PANEL      Result Value Range   Sodium 146 (*) 135 - 145 mEq/L   Potassium 3.4 (*) 3.5 - 5.1 mEq/L   Chloride 104  96 - 112 mEq/L   CO2 29  19 - 32 mEq/L   Glucose, Bld 137  (*) 70 - 99 mg/dL   BUN 35 (*) 6 - 23 mg/dL   Creatinine, Ser 1.61  0.50 - 1.35 mg/dL   Calcium 9.2  8.4 - 09.6 mg/dL   Total Protein 7.0  6.0 - 8.3 g/dL   Albumin 3.6  3.5 - 5.2 g/dL   AST 19  0 - 37 U/L   ALT 16  0 - 53 U/L   Alkaline Phosphatase 103  39 - 117 U/L   Total Bilirubin 0.3  0.3 - 1.2 mg/dL   GFR calc non Af Amer 53 (*) >90 mL/min   GFR calc Af Amer 61 (*) >90 mL/min  APTT      Result Value Range   aPTT 25  24 - 37 seconds  PROTIME-INR      Result Value Range   Prothrombin Time 13.8  11.6 - 15.2 seconds   INR 1.07  0.00 - 1.49  OCCULT BLOOD, POC DEVICE      Result Value Range   Fecal Occult Bld NEGATIVE  NEGATIVE  SAMPLE TO BLOOD BANK      Result Value Range   Blood Bank Specimen SAMPLE AVAILABLE FOR TESTING     Sample Expiration 10/26/2012     Laboratory interpretation all normal except for leukocytosis, stable anemia, mild hyponatremia   Dg Abd Acute W/chest  10/23/2012   *RADIOLOGY REPORT*  Clinical Data: Abdominal pain, emesis, and weakness  ACUTE ABDOMEN SERIES (ABDOMEN 2 VIEW & CHEST 1 VIEW)  Comparison: Acute abdominal series 10/19/2012 and CT abdomen pelvis 10/19/2012 and CT chest 02/07/2010  Findings: There is chronic pleuroparenchymal scarring and volume loss at the lung apices.  Chronic peribronchial thickening/bronchiectasis is noted at the right lung base. Calcified granuloma left lung base.  There is a small right pleural effusion.  This appears new.  No evidence of free intraperitoneal air.  Oral contrast material is seen throughout the nondistended colon. This contrast is likely from the recent CT abdomen pelvis dated 10/19/2012.  There are no dilated small bowel loops.  Pneumobilia is noted.  The patient has a history of sphincterotomy and March 2014 per electronic medical records, which likely explains the pneumobilia.  Calcified phleboliths are noted in the right aspect of the pelvis.  No acute osseous abnormality identified.  IMPRESSION:  1.   Nonobstructive bowel gas pattern. 2.  Pneumobilia.  The patient has a history of sphincterotomy, which likely explains this finding. 3.  Small right pleural effusion is new ( was not present on CT abdomen pelvis of 10/19/2012). 4.  Chronic pleuroparenchymal scarring at the lung apices and chronic peribronchial thickening / bronchiectasis at the right lung base.   Original Report Authenticated By: Britta Mccreedy, M.D.    Ct Abdomen Pelvis W Contrast  10/19/2012   **ADDENDUM** CREATED: 10/19/2012 15:13:52  The patient has apparently not had a cholecystectomy despite the history provided.  The gallbladder is not visualized on the ultrasound examination.  **END ADDENDUM** SIGNED BY: Cyndie Chime, M.D.  10/19/2012   **ADDENDUM** CREATED: 10/19/2012 15:03:34  Despite a previous report of recent cholecystectomy, provided on sonographic examination of 10/19/2012, the patient adamantly denies such history.  On the current examination, gallbladder is markedly  decompressed.  **END ADDENDUM** SIGNED BY: Reyes Ivan, M.D.  10/19/2012   IMPRESSION:  1.  Mild haziness in the porta hepatis and common bile duct region is likely reflective of recent ERCP and stent removal performed yesterday.  No evidence of free air. 2.  Trace left pleural fluid. 3.  Small bilateral inguinal hernias contain fat. 4.  Prostate enlargement.   Original Report Authenticated By: Leanna Battles, M.D.   Dg Ercp Biliary & Pancreatic Ducts  10/18/2012   IMPRESSION: ERCP, plastic stent removal, sphincterotomy and balloon sweep of the common bile duct as above.  These images were submitted for radiologic interpretation only. Please see the procedural report for the amount of contrast and the fluoroscopy time utilized.   Original Report Authenticated By: Malachy Moan, M.D.   Dg Abd Acute W/chest  10/19/2012    IMPRESSION: No acute findings.  Stable chronic lung disease.   Original Report Authenticated By: Irish Lack, M.D.   US Abdomen  Limited Ruq  10/19/2012   .  IMPRESSION:  1.  Status post cholecystectomy and common bile duct stent removal. 2.  No common bile duct dilatation and improved intrahepatic biliary dilatation. 3.  Mild diffuse fatty infiltration of the liver and probable small, 7 mm, right hepatic lobe hemangioma.                    Original Report Authenticated By: Rudie Meyer, M.D.      1. Nausea and vomiting in adult   2. Acute upper GI bleed   3. Gallstones     Plan admission   Devoria Albe, MD, FACEP   MDM    I personally performed the services described in this documentation, which was scribed in my presence. The recorded information has been reviewed and considered.  Devoria Albe, MD, Armando Gang    Edward Givens, MD 10/23/12 1539

## 2012-10-23 NOTE — ED Notes (Signed)
Patient states he does not need anything at this time. 

## 2012-10-24 DIAGNOSIS — E440 Moderate protein-calorie malnutrition: Secondary | ICD-10-CM | POA: Insufficient documentation

## 2012-10-24 LAB — COMPREHENSIVE METABOLIC PANEL
ALT: 12 U/L (ref 0–53)
AST: 17 U/L (ref 0–37)
Albumin: 2.8 g/dL — ABNORMAL LOW (ref 3.5–5.2)
Alkaline Phosphatase: 81 U/L (ref 39–117)
Chloride: 106 mEq/L (ref 96–112)
Creatinine, Ser: 1.06 mg/dL (ref 0.50–1.35)
Potassium: 3.7 mEq/L (ref 3.5–5.1)
Sodium: 142 mEq/L (ref 135–145)
Total Bilirubin: 0.3 mg/dL (ref 0.3–1.2)
Total Protein: 5.6 g/dL — ABNORMAL LOW (ref 6.0–8.3)

## 2012-10-24 LAB — CBC
HCT: 29.8 % — ABNORMAL LOW (ref 39.0–52.0)
MCH: 33.1 pg (ref 26.0–34.0)
MCHC: 34.6 g/dL (ref 30.0–36.0)
MCV: 95.8 fL (ref 78.0–100.0)
RDW: 12.6 % (ref 11.5–15.5)

## 2012-10-24 LAB — GLUCOSE, CAPILLARY
Glucose-Capillary: 118 mg/dL — ABNORMAL HIGH (ref 70–99)
Glucose-Capillary: 92 mg/dL (ref 70–99)

## 2012-10-24 NOTE — Plan of Care (Signed)
Problem: Phase I Progression Outcomes Goal: OOB as tolerated unless otherwise ordered Outcome: Progressing 10/24/12 1236 Up to chair with assistance, chair alarm on for safety. Call light within reach, discussed to call for assistance and not attempt getting up on his own. Family at bedside. Earnstine Regal, RN

## 2012-10-24 NOTE — Progress Notes (Signed)
INITIAL NUTRITION ASSESSMENT  DOCUMENTATION CODES Per approved criteria  -Non-severe (moderate) malnutrition in the context of acute illness or injury   INTERVENTION: RD will add oral supplement when po tolerance improves  NUTRITION DIAGNOSIS: Malnutrition related to altered altered GI function as evidenced by abdominal pain, N/V, PMH and surgical hx. In addition, he has had unplanned 10#, 5% wt loss < 30 days, poor tolerance of clear liquids (energy intake < 75% for > 7 days).  Goal: Pt to meet >/= 90% of their estimated nutrition needs  Monitor:  Diet tolerance/ advancement, po intake, labs and wt trends  Reason for Assessment: Malnutrition Screen Score = 2  79 y.o. male  Admitting c/o abdominal pain, nausea and vomiting  ASSESSMENT:  Pt has lunch tray and is  trying to drink some beef broth. After a few sips he set his clear liquid tray aside. He says "nothing taste right" and when he attempts to eat or drink his abdominal pain increases. He did taste a Nurse, adult but didn't feel that he could drink at this time. He refused enema last night. Last BM 10/19/12. Will follow his progress and add oral supplement when GI symptoms improve.  Height: Ht Readings from Last 1 Encounters:  10/23/12 6\' 3"  (1.905 m)    Weight: Wt Readings from Last 1 Encounters:  10/23/12 175 lb 0.7 oz (79.4 kg)    Ideal Body Weight: 196# (89 kg)  % Ideal Body Weight: 89%  Wt Readings from Last 10 Encounters:  10/23/12 175 lb 0.7 oz (79.4 kg)  10/19/12 184 lb 15.5 oz (83.9 kg)  10/15/12 184 lb (83.462 kg)  10/01/12 184 lb 3.2 oz (83.553 kg)  07/24/12 180 lb (81.647 kg)  07/24/12 180 lb (81.647 kg)  01/24/11 175 lb (79.379 kg)    Usual Body Weight: 184# (10/01/12)  % Usual Body Weight: 95%  BMI:  Body mass index is 21.88 kg/(m^2). normal range  Estimated Nutritional Needs: Kcal: 4098-1191 Protein: 86-102 gr Fluid: >2000 ml/day  Skin: No issues noted  Diet Order: Clear  Liquid  EDUCATION NEEDS: -No education needs identified at this time   Intake/Output Summary (Last 24 hours) at 10/24/12 1304 Last data filed at 10/24/12 0500  Gross per 24 hour  Intake      0 ml  Output    300 ml  Net   -300 ml    Last BM: 10/19/12  Labs:   Recent Labs Lab 10/20/12 0706 10/23/12 0941 10/24/12 0637  NA 134* 146* 142  K 4.2 3.4* 3.7  CL 101 104 106  CO2 26 29 29   BUN 7 35* 35*  CREATININE 0.94 1.26 1.06  CALCIUM 8.4 9.2 8.2*  GLUCOSE 116* 137* 126*    CBG (last 3)   Recent Labs  10/24/12 0737 10/24/12 1152  GLUCAP 118* 92    Scheduled Meds: . cefTRIAXone (ROCEPHIN)  IV  1 g Intravenous Q24H  . clonazePAM  0.5 mg Oral TID  . milk and molasses   Rectal Once  . pantoprazole (PROTONIX) IV  40 mg Intravenous Q12H  . QUEtiapine  100 mg Oral TID    Continuous Infusions: . 0.45 % NaCl with KCl 20 mEq / L      Past Medical History  Diagnosis Date  . Depression   . Insomnia   . Anxiety   . Suicide attempt   . Erosive esophagitis 07/25/2012  . Gastric out let obstruction 07/25/2012  . HOH (hard of hearing)  Past Surgical History  Procedure Laterality Date  . Appendectomy    . Back surgery    . Ercp N/A 07/24/2012    Dr. Jena Gauss. Significant abnormalities of the bowl and proximal second portion producing partial gastric outlet stricture and with secondary gastric dilation and severe erosive reflux esophagitis. Biopsy showed benign ulceration. Normal-appearing ampulla, status post biliary sphincterotomy and ampulla balloon dilation, stone extraction and stent placement.  Marland Kitchen Sphincterotomy N/A 07/24/2012    Procedure: SPHINCTEROTOMY;  Surgeon: Corbin Ade, MD;  Location: [facility];  Service: Endoscopy;  Laterality: N/A;  . Esophageal biopsy N/A 07/24/2012    Procedure: BIOPSY;  Surgeon: Corbin Ade, MD;  Location: [facility];  Service: Endoscopy;  Laterality: N/A;  Duodenal and Esophageal Biopsies  . Esophagogastroduodenoscopy N/A 07/24/2012     WUJ:[id number] ampulla s/p biliary sphincterotomy and ampullary  . Balloon dilation N/A 07/24/2012    Procedure: BALLOON DILATION;  Surgeon: Corbin Ade, MD;  Location: [facility];  Service: Endoscopy;  Laterality: N/A;  Balloon Stone Extraction, Drudging  . Removal of stones N/A 07/24/2012    Procedure: REMOVAL OF STONES;  Surgeon: Corbin Ade, MD;  Location: [facility];  Service: Endoscopy;  Laterality: N/A;  . Egd/ercp  10/18/2012    Dr. Jena Gauss: ;yloric channel stenosis, s/p dilation and bx, persisting CBD stones with extension of sphincterotomy and sphincterotomy balloon dilation and stone extraction. Removal of biliary stent  . Esophagogastroduodenoscopy (egd) with propofol N/A 10/18/2012    Procedure: ESOPHAGOGASTRODUODENOSCOPY (EGD) WITH PROPOFOL;  Surgeon: Corbin Ade, MD;  Location: [facility];  Service: Endoscopy;  Laterality: N/A;  . Ercp N/A 10/18/2012    Procedure: ENDOSCOPIC RETROGRADE CHOLANGIOPANCREATOGRAPHY (ERCP);  Surgeon: Corbin Ade, MD;  Location: [facility];  Service: Endoscopy;  Laterality: N/A;  . Removal of stones N/A 10/18/2012    Procedure: REMOVAL OF STONES;  Surgeon: Corbin Ade, MD;  Location: [facility];  Service: Endoscopy;  Laterality: N/A;  . Esophageal biopsy N/A 10/18/2012    Procedure: BIOPSY;  Surgeon: Corbin Ade, MD;  Location: [facility];  Service: Endoscopy;  Laterality: N/A;    Royann Shivers MS,RD,LDN,CSG Office: [phone] Pager: [phone]

## 2012-10-24 NOTE — Consult Note (Signed)
Reason for Consult: Abdominal pain, cholelithiasis Referring Physician: [facility]  Edward Trevino is an 79 y.o. male.  HPI: Patient is a 79 year old white male with a known history of cholelithiasis and choledocholithiasis who is undergoing ERCP with stone extraction and stent placement by Dr. Jena Gauss earlier this year. He recently underwent stent removal. He has had to be admitted twice since that time with abdominal pain, nausea, and vomiting. He does have a history of gastritis as well as gastric outlet obstruction which has been dilated by Dr. Jena Gauss.  He was to have an elective cholecystectomy, but has not made it to our office to have it scheduled. He presents back to the emergency room with leukocytosis, though it's difficult to certain whether this is due to gallbladder or a urinary tract infection. Patient currently denies any nausea or significant abdominal pain to my examination.  Past Medical History  Diagnosis Date  . Depression   . Insomnia   . Anxiety   . Suicide attempt   . Erosive esophagitis 07/25/2012  . Gastric out let obstruction 07/25/2012  . HOH (hard of hearing)     Past Surgical History  Procedure Laterality Date  . Appendectomy    . Back surgery    . Ercp N/A 07/24/2012    Dr. Jena Gauss. Significant abnormalities of the bowl and proximal second portion producing partial gastric outlet stricture and with secondary gastric dilation and severe erosive reflux esophagitis. Biopsy showed benign ulceration. Normal-appearing ampulla, status post biliary sphincterotomy and ampulla balloon dilation, stone extraction and stent placement.  Marland Kitchen Sphincterotomy N/A 07/24/2012    Procedure: SPHINCTEROTOMY;  Surgeon: Corbin Ade, MD;  Location: [facility];  Service: Endoscopy;  Laterality: N/A;  . Esophageal biopsy N/A 07/24/2012    Procedure: BIOPSY;  Surgeon: Corbin Ade, MD;  Location: [facility];  Service: Endoscopy;  Laterality: N/A;  Duodenal and Esophageal Biopsies  .  Esophagogastroduodenoscopy N/A 07/24/2012    RUE:[id number] ampulla s/p biliary sphincterotomy and ampullary  . Balloon dilation N/A 07/24/2012    Procedure: BALLOON DILATION;  Surgeon: Corbin Ade, MD;  Location: [facility];  Service: Endoscopy;  Laterality: N/A;  Balloon Stone Extraction, Drudging  . Removal of stones N/A 07/24/2012    Procedure: REMOVAL OF STONES;  Surgeon: Corbin Ade, MD;  Location: [facility];  Service: Endoscopy;  Laterality: N/A;  . Egd/ercp  10/18/2012    Dr. Jena Gauss: ;yloric channel stenosis, s/p dilation and bx, persisting CBD stones with extension of sphincterotomy and sphincterotomy balloon dilation and stone extraction. Removal of biliary stent  . Esophagogastroduodenoscopy (egd) with propofol N/A 10/18/2012    Procedure: ESOPHAGOGASTRODUODENOSCOPY (EGD) WITH PROPOFOL;  Surgeon: Corbin Ade, MD;  Location: [facility];  Service: Endoscopy;  Laterality: N/A;  . Ercp N/A 10/18/2012    Procedure: ENDOSCOPIC RETROGRADE CHOLANGIOPANCREATOGRAPHY (ERCP);  Surgeon: Corbin Ade, MD;  Location: [facility];  Service: Endoscopy;  Laterality: N/A;  . Removal of stones N/A 10/18/2012    Procedure: REMOVAL OF STONES;  Surgeon: Corbin Ade, MD;  Location: [facility];  Service: Endoscopy;  Laterality: N/A;  . Esophageal biopsy N/A 10/18/2012    Procedure: BIOPSY;  Surgeon: Corbin Ade, MD;  Location: [facility];  Service: Endoscopy;  Laterality: N/A;    Family History  Problem Relation Age of Onset  . Colon cancer Neg Hx     Social History:  reports that he has been smoking Cigars.  He does not have any smokeless tobacco history on file. He reports  that he does not drink alcohol or use illicit drugs.  Allergies: No Known Allergies  Medications: I have reviewed the patient's current medications.  Results for orders placed during the hospital encounter of 10/23/12 (from the past 48 hour(s))  CBC WITH DIFFERENTIAL     Status: Abnormal   Collection Time    10/23/12  9:41 AM      Result  Value Range   WBC 16.7 (*) 4.0 - 10.5 K/uL   RBC 3.95 (*) 4.22 - 5.81 MIL/uL   Hemoglobin 12.9 (*) 13.0 - 17.0 g/dL   HCT 78.2 (*) 95.6 - 21.3 %   MCV 95.9  78.0 - 100.0 fL   MCH 32.7  26.0 - 34.0 pg   MCHC 34.0  30.0 - 36.0 g/dL   RDW 08.6  57.8 - 46.9 %   Platelets 222  150 - 400 K/uL   Neutrophils Relative % 87 (*) 43 - 77 %   Neutro Abs 14.5 (*) 1.7 - 7.7 K/uL   Lymphocytes Relative 5 (*) 12 - 46 %   Lymphs Abs 0.8  0.7 - 4.0 K/uL   Monocytes Relative 8  3 - 12 %   Monocytes Absolute 1.4 (*) 0.1 - 1.0 K/uL   Eosinophils Relative 0  0 - 5 %   Eosinophils Absolute 0.0  0.0 - 0.7 K/uL   Basophils Relative 0  0 - 1 %   Basophils Absolute 0.0  0.0 - 0.1 K/uL  COMPREHENSIVE METABOLIC PANEL     Status: Abnormal   Collection Time    10/23/12  9:41 AM      Result Value Range   Sodium 146 (*) 135 - 145 mEq/L   Potassium 3.4 (*) 3.5 - 5.1 mEq/L   Chloride 104  96 - 112 mEq/L   CO2 29  19 - 32 mEq/L   Glucose, Bld 137 (*) 70 - 99 mg/dL   BUN 35 (*) 6 - 23 mg/dL   Creatinine, Ser 6.29  0.50 - 1.35 mg/dL   Calcium 9.2  8.4 - 52.8 mg/dL   Total Protein 7.0  6.0 - 8.3 g/dL   Albumin 3.6  3.5 - 5.2 g/dL   AST 19  0 - 37 U/L   ALT 16  0 - 53 U/L   Alkaline Phosphatase 103  39 - 117 U/L   Total Bilirubin 0.3  0.3 - 1.2 mg/dL   GFR calc non Af Amer 53 (*) >90 mL/min   GFR calc Af Amer 61 (*) >90 mL/min   Comment:            The eGFR has been calculated     using the CKD EPI equation.     This calculation has not been     validated in all clinical     situations.     eGFR's persistently     <90 mL/min signify     possible Chronic Kidney Disease.  APTT     Status: None   Collection Time    10/23/12  9:41 AM      Result Value Range   aPTT 25  24 - 37 seconds  PROTIME-INR     Status: None   Collection Time    10/23/12  9:41 AM      Result Value Range   Prothrombin Time 13.8  11.6 - 15.2 seconds   INR 1.07  0.00 - 1.49  SAMPLE TO BLOOD BANK     Status: None   Collection Time  10/23/12  9:41 AM      Result Value Range   Blood Bank Specimen SAMPLE AVAILABLE FOR TESTING     Sample Expiration 10/26/2012    OCCULT BLOOD, POC DEVICE     Status: None   Collection Time    10/23/12  9:44 AM      Result Value Range   Fecal Occult Bld NEGATIVE  NEGATIVE  LIPASE, BLOOD     Status: None   Collection Time    10/23/12  2:31 PM      Result Value Range   Lipase 11  11 - 59 U/L  URINALYSIS, ROUTINE W REFLEX MICROSCOPIC     Status: Abnormal   Collection Time    10/23/12  6:40 PM      Result Value Range   Color, Urine YELLOW  YELLOW   APPearance CLEAR  CLEAR   Specific Gravity, Urine 1.020  1.005 - 1.030   pH 7.0  5.0 - 8.0   Glucose, UA NEGATIVE  NEGATIVE mg/dL   Hgb urine dipstick NEGATIVE  NEGATIVE   Bilirubin Urine NEGATIVE  NEGATIVE   Ketones, ur 15 (*) NEGATIVE mg/dL   Protein, ur NEGATIVE  NEGATIVE mg/dL   Urobilinogen, UA 0.2  0.0 - 1.0 mg/dL   Nitrite NEGATIVE  NEGATIVE   Leukocytes, UA NEGATIVE  NEGATIVE   Comment: MICROSCOPIC NOT DONE ON URINES WITH NEGATIVE PROTEIN, BLOOD, LEUKOCYTES, NITRITE, OR GLUCOSE <1000 mg/dL.  COMPREHENSIVE METABOLIC PANEL     Status: Abnormal   Collection Time    10/24/12  6:37 AM      Result Value Range   Sodium 142  135 - 145 mEq/L   Potassium 3.7  3.5 - 5.1 mEq/L   Chloride 106  96 - 112 mEq/L   CO2 29  19 - 32 mEq/L   Glucose, Bld 126 (*) 70 - 99 mg/dL   BUN 35 (*) 6 - 23 mg/dL   Creatinine, Ser 6.96  0.50 - 1.35 mg/dL   Calcium 8.2 (*) 8.4 - 10.5 mg/dL   Total Protein 5.6 (*) 6.0 - 8.3 g/dL   Albumin 2.8 (*) 3.5 - 5.2 g/dL   AST 17  0 - 37 U/L   ALT 12  0 - 53 U/L   Alkaline Phosphatase 81  39 - 117 U/L   Total Bilirubin 0.3  0.3 - 1.2 mg/dL   GFR calc non Af Amer 65 (*) >90 mL/min   GFR calc Af Amer 76 (*) >90 mL/min   Comment:            The eGFR has been calculated     using the CKD EPI equation.     This calculation has not been     validated in all clinical     situations.     eGFR's persistently      <90 mL/min signify     possible Chronic Kidney Disease.  CBC     Status: Abnormal   Collection Time    10/24/12  6:37 AM      Result Value Range   WBC 13.8 (*) 4.0 - 10.5 K/uL   RBC 3.11 (*) 4.22 - 5.81 MIL/uL   Hemoglobin 10.3 (*) 13.0 - 17.0 g/dL   Comment: DELTA CHECK NOTED     RESULT REPEATED AND VERIFIED   HCT 29.8 (*) 39.0 - 52.0 %   MCV 95.8  78.0 - 100.0 fL   MCH 33.1  26.0 - 34.0 pg   MCHC 34.6  30.0 - 36.0 g/dL   RDW 11.9  14.7 - 82.9 %   Platelets 204  150 - 400 K/uL  GLUCOSE, CAPILLARY     Status: Abnormal   Collection Time    10/24/12  7:37 AM      Result Value Range   Glucose-Capillary 118 (*) 70 - 99 mg/dL  GLUCOSE, CAPILLARY     Status: None   Collection Time    10/24/12 11:52 AM      Result Value Range   Glucose-Capillary 92  70 - 99 mg/dL    Dg Abd Acute W/chest  10/23/2012   *RADIOLOGY REPORT*  Clinical Data: Abdominal pain, emesis, and weakness  ACUTE ABDOMEN SERIES (ABDOMEN 2 VIEW & CHEST 1 VIEW)  Comparison: Acute abdominal series 10/19/2012 and CT abdomen pelvis 10/19/2012 and CT chest 02/07/2010  Findings: There is chronic pleuroparenchymal scarring and volume loss at the lung apices.  Chronic peribronchial thickening/bronchiectasis is noted at the right lung base. Calcified granuloma left lung base.  There is a small right pleural effusion.  This appears new.  No evidence of free intraperitoneal air.  Oral contrast material is seen throughout the nondistended colon. This contrast is likely from the recent CT abdomen pelvis dated 10/19/2012.  There are no dilated small bowel loops.  Pneumobilia is noted.  The patient has a history of sphincterotomy and March 2014 per electronic medical records, which likely explains the pneumobilia.  Calcified phleboliths are noted in the right aspect of the pelvis.  No acute osseous abnormality identified.  IMPRESSION:  1.  Nonobstructive bowel gas pattern. 2.  Pneumobilia.  The patient has a history of sphincterotomy, which  likely explains this finding. 3.  Small right pleural effusion is new ( was not present on CT abdomen pelvis of 10/19/2012). 4.  Chronic pleuroparenchymal scarring at the lung apices and chronic peribronchial thickening / bronchiectasis at the right lung base.   Original Report Authenticated By: Britta Mccreedy, M.D.    ROS: See chart Blood pressure 110/76, pulse 103, temperature 98 F (36.7 C), temperature source Oral, resp. rate 20, height 6\' 3"  (1.905 m), weight 79.4 kg (175 lb 0.7 oz), SpO2 94.00%. Physical Exam: Pleasant white male no acute distress. Lungs clear to auscultation with equal breath sounds bilaterally. Heart examination reveals a regular rate and rhythm without S3, S4, murmurs. Abdomen is soft and slightly distended, but not tense. No significant right upper quadrant abdominal pain is noted.  Assessment/Plan: Impression: Cholelithiasis, leukocytosis of unknown etiology Plan: We'll remove the gallbladder during this admission. The patient is agreeable to this. The risks and benefits of the procedure including bleeding, infection, hepatobiliary injury, and the possibility of an open procedure were fully explained to the patient, who gave informed consent. Temporarily scheduled for 10/26/2012. Will advance diet at this time.  Brittanya Winburn A 10/24/2012, 3:16 PM

## 2012-10-24 NOTE — Progress Notes (Signed)
Utilization Review Complete  

## 2012-10-24 NOTE — Progress Notes (Signed)
[facility] PROGRESS NOTE  Edward Trevino ZOX:[id number] DOB: 27-Sep-1931 DOA: 10/23/2012 PCP: Edward Boston, MD  Assessment/Plan: 1. Abdominal pain and vomiting. Unclear etiology. Possibly related to constipation/ileus. Could also be related to cholelithiasis. We'll provide with enemas, laxatives. He has been seen by Dr. Lovell Trevino and scheduled for cholecystectomy. 2. Dehydration, improved with IV fluids. 3. Electrolyte abnormalities, corrected with IV fluids 4. Leukocytosis, no clear source of infection. Started on Rocephin to cover any intra-abdominal pathologies. 5. Depression. Continue Seroquel Klonopin. 6. Anemia. Possibly dilutional. Patient has not had any further vomiting. He does not have any further evidence of GI bleeding. Continue PPI and monitor.  Code Status: DO NOT RESUSCITATE Family Communication: Discussed with patient and children at the bedside Disposition Plan: Discharge home once improved   Consultants:  General surgery, Dr. Lovell Trevino  Procedures:  None  Antibiotics:  Rocephin 6/10  HPI/Subjective: Still nauseous, no vomiting, no bowel movement, still having some abdominal discomfort  Objective: Filed Vitals:   10/23/12 1800 10/23/12 2055 10/24/12 0633 10/24/12 1400  BP: 155/88 161/102 105/72 110/76  Pulse: 105 101 103 103  Temp: 98.3 F (36.8 C) 98 F (36.7 C) 98.1 F (36.7 C) 98 F (36.7 C)  TempSrc: Oral Oral Oral Oral  Resp: 20 16 20 20   Height: 6\' 3"  (1.905 m)     Weight: 79.4 kg (175 lb 0.7 oz)     SpO2: 99% 100% 95% 94%    Intake/Output Summary (Last 24 hours) at 10/24/12 1547 Last data filed at 10/24/12 0500  Gross per 24 hour  Intake      0 ml  Output    300 ml  Net   -300 ml   Filed Weights   10/23/12 0901 10/23/12 1800  Weight: 83.462 kg (184 lb) 79.4 kg (175 lb 0.7 oz)    Exam:   General:  NAD  Cardiovascular: S1, S2, regular rate and rhythm  Respiratory: Clear to auscultation bilaterally  Abdomen: Soft, appears  distended, bowel sounds are sluggish, nontender  Musculoskeletal: No pedal edema bilaterally   Data Reviewed: Basic Metabolic Panel:  Recent Labs Lab 10/19/12 0842 10/20/12 0706 10/23/12 0941 10/24/12 0637  NA 142 134* 146* 142  K 3.9 4.2 3.4* 3.7  CL 102 101 104 106  CO2 32 26 29 29   GLUCOSE 127* 116* 137* 126*  BUN 11 7 35* 35*  CREATININE 1.24 0.94 1.26 1.06  CALCIUM 9.4 8.4 9.2 8.2*   Liver Function Tests:  Recent Labs Lab 10/19/12 0842 10/20/12 0706 10/23/12 0941 10/24/12 0637  AST 26 24 19 17   ALT 24 20 16 12   ALKPHOS 117 111 103 81  BILITOT 0.4 0.5 0.3 0.3  PROT 7.3 6.9 7.0 5.6*  ALBUMIN 4.0 3.5 3.6 2.8*    Recent Labs Lab 10/19/12 0842 10/23/12 1431  LIPASE 33 11   No results found for this basename: AMMONIA,  in the last 168 hours CBC:  Recent Labs Lab 10/19/12 0842 10/20/12 0706 10/23/12 0941 10/24/12 0637  WBC 9.9 13.0* 16.7* 13.8*  NEUTROABS 7.9*  --  14.5*  --   HGB 13.7 13.8 12.9* 10.3*  HCT 39.5 40.5 37.9* 29.8*  MCV 95.4 96.7 95.9 95.8  PLT 206 178 222 204   Cardiac Enzymes: No results found for this basename: CKTOTAL, CKMB, CKMBINDEX, TROPONINI,  in the last 168 hours BNP (last 3 results) No results found for this basename: PROBNP,  in the last 8760 hours CBG:  Recent Labs Lab 10/20/12 0758 10/20/12 1206 10/24/12  1610 10/24/12 1152  GLUCAP 99 101* 118* 92    No results found for this or any previous visit (from the past 240 hour(s)).   Studies: Dg Abd Acute W/chest  10/23/2012   *RADIOLOGY REPORT*  Clinical Data: Abdominal pain, emesis, and weakness  ACUTE ABDOMEN SERIES (ABDOMEN 2 VIEW & CHEST 1 VIEW)  Comparison: Acute abdominal series 10/19/2012 and CT abdomen pelvis 10/19/2012 and CT chest 02/07/2010  Findings: There is chronic pleuroparenchymal scarring and volume loss at the lung apices.  Chronic peribronchial thickening/bronchiectasis is noted at the right lung base. Calcified granuloma left lung base.  There is a  small right pleural effusion.  This appears new.  No evidence of free intraperitoneal air.  Oral contrast material is seen throughout the nondistended colon. This contrast is likely from the recent CT abdomen pelvis dated 10/19/2012.  There are no dilated small bowel loops.  Pneumobilia is noted.  The patient has a history of sphincterotomy and March 2014 per electronic medical records, which likely explains the pneumobilia.  Calcified phleboliths are noted in the right aspect of the pelvis.  No acute osseous abnormality identified.  IMPRESSION:  1.  Nonobstructive bowel gas pattern. 2.  Pneumobilia.  The patient has a history of sphincterotomy, which likely explains this finding. 3.  Small right pleural effusion is new ( was not present on CT abdomen pelvis of 10/19/2012). 4.  Chronic pleuroparenchymal scarring at the lung apices and chronic peribronchial thickening / bronchiectasis at the right lung base.   Original Report Authenticated By: Edward Trevino, M.D.    Scheduled Meds: . cefTRIAXone (ROCEPHIN)  IV  1 g Intravenous Q24H  . clonazePAM  0.5 mg Oral TID  . milk and molasses   Rectal Once  . pantoprazole (PROTONIX) IV  40 mg Intravenous Q12H  . QUEtiapine  100 mg Oral TID   Continuous Infusions: . 0.45 % NaCl with KCl 20 mEq / L 100 mL/hr at 10/24/12 1408    Active Problems:   Gallstones   Abdominal pain   Nausea   Vomiting   Dehydration   Hypernatremia   Constipation   Hypokalemia   Malnutrition of moderate degree    Time spent:    Edward Trevino  [facility] Pager [phone]. If 7PM-7AM, please contact night-coverage at [email or web address], password [facility] 10/24/2012, 3:47 PM  LOS: 1 day

## 2012-10-25 DIAGNOSIS — R7989 Other specified abnormal findings of blood chemistry: Secondary | ICD-10-CM

## 2012-10-25 LAB — BASIC METABOLIC PANEL
CO2: 25 mEq/L (ref 19–32)
Calcium: 7.9 mg/dL — ABNORMAL LOW (ref 8.4–10.5)
GFR calc non Af Amer: 79 mL/min — ABNORMAL LOW (ref >90)
Glucose, Bld: 103 mg/dL — ABNORMAL HIGH (ref 70–99)
Potassium: 3.7 mEq/L (ref 3.5–5.1)
Sodium: 138 mEq/L (ref 135–145)

## 2012-10-25 LAB — CBC
HCT: 22.2 % — ABNORMAL LOW (ref 39.0–52.0)
Hemoglobin: 8 g/dL — ABNORMAL LOW (ref 13.0–17.0)
MCH: 34.3 pg — ABNORMAL HIGH (ref 26.0–34.0)
RBC: 2.33 MIL/uL — ABNORMAL LOW (ref 4.22–5.81)
WBC: 9.2 10*3/uL (ref 4.0–10.5)

## 2012-10-25 LAB — PREPARE RBC (CROSSMATCH)

## 2012-10-25 LAB — ABO/RH: ABO/RH(D): O POS

## 2012-10-25 LAB — SURGICAL PCR SCREEN: Staphylococcus aureus: NEGATIVE

## 2012-10-25 LAB — HEMOGLOBIN AND HEMATOCRIT, BLOOD
HCT: 24.1 % — ABNORMAL LOW (ref 39.0–52.0)
Hemoglobin: 10 g/dL — ABNORMAL LOW (ref 13.0–17.0)

## 2012-10-25 MED ORDER — CHLORHEXIDINE GLUCONATE 4 % EX LIQD
1.0000 "application " | Freq: Once | CUTANEOUS | Status: AC
  Administered 2012-10-25: 1 via TOPICAL
  Filled 2012-10-25: qty 15

## 2012-10-25 NOTE — Progress Notes (Signed)
10/25/12 1756 Late entry for 10/24/12 1900. Patient received milk of molasses enema x 1 as ordered per Dr. Kerry Hough. Patient had large brown stool after enema per nurse tech. Earnstine Regal, RN

## 2012-10-25 NOTE — Progress Notes (Signed)
  Edward Trevino YNW:[id number] DOB: [birth year] DOA: 10/23/2012 PCP: Louie Boston, MD   Subjective: Patient doing well. No complaints. Having blood transfusion.           Physical Exam: Blood pressure 101/68, pulse 91, temperature 98.3 F (36.8 C), temperature source Oral, resp. rate 20, height 6\' 3"  (1.905 m), weight 79.4 kg (175 lb 0.7 oz), SpO2 99.00%. Looks systemically well. Heart sounds are present without murmurs or added sounds. Lung fields are clear. Abdomen is soft and mildly tender in the right upper quadrant. Is not toxic or septic. He is alert and orientated.   Investigations:     Basic Metabolic Panel:  Recent Labs  65/78/46 0637 10/25/12 0617  NA 142 138  K 3.7 3.7  CL 106 110  CO2 29 25  GLUCOSE 126* 103*  BUN 35* 29*  CREATININE 1.06 0.91  CALCIUM 8.2* 7.9*   Liver Function Tests:  Recent Labs  10/23/12 0941 10/24/12 0637  AST 19 17  ALT 16 12  ALKPHOS 103 81  BILITOT 0.3 0.3  PROT 7.0 5.6*  ALBUMIN 3.6 2.8*     CBC:  Recent Labs  10/23/12 0941 10/24/12 0637 10/25/12 0617 10/25/12 0831  WBC 16.7* 13.8* 9.2  --   NEUTROABS 14.5*  --   --   --   HGB 12.9* 10.3* 8.0* 8.4*  HCT 37.9* 29.8* 22.2* 24.1*  MCV 95.9 95.8 95.3  --   PLT 222 204 154  --     No results found.    Medications: I have reviewed the patient's current medications.  Impression: 1. Abdominal pain and vomiting, likely related to cholelithiasis. Cholecystectomy tomorrow. 2. Dehydration, improved with IV fluids. 3. Anemia of chronic disease.     Plan: 1. Agree with blood transfusion today. 2. Cholecystectomy tomorrow.  Consultants:  Surgery, Dr. Lovell Sheehan.   Procedures:  None.   Antibiotics:  Rocephin IV started 10/23/2012.                   Code Status: Full code.  Family Communication: Discussed plan with patient at the bedside.   Disposition Plan: Home when medically stable.  Time spent: 15 minutes.   LOS: 2 days    Wilson Singer Pager [phone]  10/25/2012, 2:44 PM

## 2012-10-25 NOTE — Progress Notes (Signed)
Patient has had a drop in his hemoglobin. The lab test is to be repeated to see whether this is a true value. Patient has not had hematemesis or hematochezia. He is sitting up in chair and does not report any significant abdominal symptoms. The drop in hemoglobin may be either delusional or a combination of chronic anemia from his known gastritis. Surgery is still scheduled for tomorrow. The risks and benefits of the procedure including bleeding, infection, hepatobiliary injury, and the possibility of an open procedure were fully explained to the patient, who gave informed consent.

## 2012-10-25 NOTE — Progress Notes (Signed)
10/25/12 Late entry for 1335. Dr. Lovell Sheehan discussed need for blood transfusion before surgery with patient again this afternoon. Patient stated understood need for transfusion and agreeable to having blood transfusion. Pt discussed with friends and daughter-in-law at bedside this afternoon as well. Consents signed and on chart. Earnstine Regal, RN

## 2012-10-25 NOTE — Progress Notes (Signed)
10/25/12 1242 Patient has blood transfusion ordered to receive 2 units blood today. Dr. Lovell Sheehan discussed with patient this morning and patient was agreeable. Pt stated preferred to discuss blood transfusion and surgery with his son, [name] before signing consents. Stated son would probably be visiting this evening. Nursing offered to call son for him to discuss, stated "that would be premature, let's wait until 6 pm". Discussed that blood transfusion needed to start before this evening since needed preop per MD. Pt stated "too tired to make decision right now, i'll talk to my son about it later". Notified Dr Lovell Sheehan of situation. Stated would come and talk with patient again this afternoon. Earnstine Regal, RN

## 2012-10-26 ENCOUNTER — Encounter (HOSPITAL_COMMUNITY): Payer: Self-pay | Admitting: Anesthesiology

## 2012-10-26 ENCOUNTER — Inpatient Hospital Stay (HOSPITAL_COMMUNITY): Payer: Medicare Other | Admitting: Anesthesiology

## 2012-10-26 ENCOUNTER — Encounter (HOSPITAL_COMMUNITY): Payer: Self-pay | Admitting: *Deleted

## 2012-10-26 ENCOUNTER — Encounter (HOSPITAL_COMMUNITY)
Admission: EM | Disposition: A | Discharge status: Home / Self Care | Payer: Self-pay | Source: Home / Self Care | Attending: Internal Medicine

## 2012-10-26 DIAGNOSIS — R319 Hematuria, unspecified: Secondary | ICD-10-CM

## 2012-10-26 HISTORY — PX: CHOLECYSTECTOMY: SHX55

## 2012-10-26 LAB — BASIC METABOLIC PANEL
Calcium: 8.6 mg/dL (ref 8.4–10.5)
GFR calc Af Amer: 89 mL/min — ABNORMAL LOW (ref >90)
GFR calc non Af Amer: 76 mL/min — ABNORMAL LOW (ref >90)
Potassium: 3.5 mEq/L (ref 3.5–5.1)
Sodium: 139 mEq/L (ref 135–145)

## 2012-10-26 LAB — CBC
Hemoglobin: 10.8 g/dL — ABNORMAL LOW (ref 13.0–17.0)
MCH: 31.6 pg (ref 26.0–34.0)
MCHC: 35 g/dL (ref 30.0–36.0)
Platelets: 206 10*3/uL (ref 150–400)
RDW: 15.2 % (ref 11.5–15.5)
WBC: 8.4 10*3/uL (ref 4.0–10.5)

## 2012-10-26 LAB — PREPARE RBC (CROSSMATCH)

## 2012-10-26 LAB — HEMOGLOBIN AND HEMATOCRIT, BLOOD: HCT: 31.8 % — ABNORMAL LOW (ref 39.0–52.0)

## 2012-10-26 SURGERY — LAPAROSCOPIC CHOLECYSTECTOMY
Anesthesia: General | Site: Abdomen | Wound class: Contaminated

## 2012-10-26 MED ORDER — SODIUM CHLORIDE 0.9 % IR SOLN
Status: DC | PRN
  Administered 2012-10-26: 1000 mL

## 2012-10-26 MED ORDER — SUCCINYLCHOLINE CHLORIDE 20 MG/ML IJ SOLN
INTRAMUSCULAR | Status: DC | PRN
  Administered 2012-10-26: 110 mg via INTRAVENOUS

## 2012-10-26 MED ORDER — LACTATED RINGERS IV SOLN
INTRAVENOUS | Status: DC | PRN
  Administered 2012-10-26 (×2): via INTRAVENOUS

## 2012-10-26 MED ORDER — GLYCOPYRROLATE 0.2 MG/ML IJ SOLN
INTRAMUSCULAR | Status: AC
  Filled 2012-10-26: qty 1

## 2012-10-26 MED ORDER — MORPHINE SULFATE 2 MG/ML IJ SOLN
2.0000 mg | INTRAMUSCULAR | Status: DC | PRN
  Administered 2012-10-26: 2 mg via INTRAVENOUS
  Filled 2012-10-26: qty 1

## 2012-10-26 MED ORDER — BUPIVACAINE HCL (PF) 0.5 % IJ SOLN
INTRAMUSCULAR | Status: AC
  Filled 2012-10-26: qty 30

## 2012-10-26 MED ORDER — ROCURONIUM BROMIDE 100 MG/10ML IV SOLN
INTRAVENOUS | Status: DC | PRN
  Administered 2012-10-26: 10 mg via INTRAVENOUS
  Administered 2012-10-26: 5 mg via INTRAVENOUS
  Administered 2012-10-26: 25 mg via INTRAVENOUS
  Administered 2012-10-26: 10 mg via INTRAVENOUS

## 2012-10-26 MED ORDER — PROPOFOL 10 MG/ML IV BOLUS
INTRAVENOUS | Status: DC | PRN
  Administered 2012-10-26: 150 mg via INTRAVENOUS

## 2012-10-26 MED ORDER — ONDANSETRON HCL 4 MG/2ML IJ SOLN
4.0000 mg | Freq: Once | INTRAMUSCULAR | Status: AC
  Administered 2012-10-26: 4 mg via INTRAVENOUS

## 2012-10-26 MED ORDER — FENTANYL CITRATE 0.05 MG/ML IJ SOLN
INTRAMUSCULAR | Status: AC
  Filled 2012-10-26: qty 2

## 2012-10-26 MED ORDER — NEOSTIGMINE METHYLSULFATE 1 MG/ML IJ SOLN
INTRAMUSCULAR | Status: DC | PRN
  Administered 2012-10-26: 2 mg via INTRAVENOUS

## 2012-10-26 MED ORDER — LIDOCAINE HCL (CARDIAC) 20 MG/ML IV SOLN
INTRAVENOUS | Status: DC | PRN
  Administered 2012-10-26: 30 mg via INTRAVENOUS

## 2012-10-26 MED ORDER — LACTATED RINGERS IV SOLN
INTRAVENOUS | Status: DC
  Administered 2012-10-26: 75 mL/h via INTRAVENOUS
  Administered 2012-10-27: 08:00:00 via INTRAVENOUS

## 2012-10-26 MED ORDER — FENTANYL CITRATE 0.05 MG/ML IJ SOLN
25.0000 ug | INTRAMUSCULAR | Status: DC | PRN
  Administered 2012-10-26 (×2): 50 ug via INTRAVENOUS

## 2012-10-26 MED ORDER — FENTANYL CITRATE 0.05 MG/ML IJ SOLN
INTRAMUSCULAR | Status: AC
  Filled 2012-10-26: qty 5

## 2012-10-26 MED ORDER — PHENYLEPHRINE HCL 10 MG/ML IJ SOLN
INTRAMUSCULAR | Status: DC | PRN
  Administered 2012-10-26: 50 ug via INTRAVENOUS

## 2012-10-26 MED ORDER — KETOROLAC TROMETHAMINE 30 MG/ML IJ SOLN: 30.0000 mg | Freq: Once | INTRAMUSCULAR | Status: DC

## 2012-10-26 MED ORDER — PROPOFOL 10 MG/ML IV EMUL
INTRAVENOUS | Status: AC
  Filled 2012-10-26: qty 20

## 2012-10-26 MED ORDER — MIDAZOLAM HCL 2 MG/2ML IJ SOLN
INTRAMUSCULAR | Status: AC
  Filled 2012-10-26: qty 2

## 2012-10-26 MED ORDER — HEMOSTATIC AGENTS (NO CHARGE) OPTIME
TOPICAL | Status: DC | PRN
  Administered 2012-10-26: 1 via TOPICAL

## 2012-10-26 MED ORDER — GLYCOPYRROLATE 0.2 MG/ML IJ SOLN
INTRAMUSCULAR | Status: DC | PRN
  Administered 2012-10-26: .4 mg via INTRAVENOUS

## 2012-10-26 MED ORDER — GLYCOPYRROLATE 0.2 MG/ML IJ SOLN
0.2000 mg | Freq: Once | INTRAMUSCULAR | Status: AC
  Administered 2012-10-26: 0.2 mg via INTRAVENOUS

## 2012-10-26 MED ORDER — LIDOCAINE HCL (PF) 1 % IJ SOLN
INTRAMUSCULAR | Status: AC
  Filled 2012-10-26: qty 5

## 2012-10-26 MED ORDER — MIDAZOLAM HCL 2 MG/2ML IJ SOLN
1.0000 mg | INTRAMUSCULAR | Status: DC | PRN
  Administered 2012-10-26 (×2): 1 mg via INTRAVENOUS

## 2012-10-26 MED ORDER — NEOSTIGMINE METHYLSULFATE 1 MG/ML IJ SOLN
INTRAMUSCULAR | Status: AC
  Filled 2012-10-26: qty 1

## 2012-10-26 MED ORDER — LABETALOL HCL 5 MG/ML IV SOLN
INTRAVENOUS | Status: DC | PRN
  Administered 2012-10-26: 5 mg via INTRAVENOUS

## 2012-10-26 MED ORDER — SUCCINYLCHOLINE CHLORIDE 20 MG/ML IJ SOLN
INTRAMUSCULAR | Status: AC
  Filled 2012-10-26: qty 1

## 2012-10-26 MED ORDER — GLYCOPYRROLATE 0.2 MG/ML IJ SOLN
INTRAMUSCULAR | Status: AC
  Filled 2012-10-26: qty 2

## 2012-10-26 MED ORDER — ONDANSETRON HCL 4 MG/2ML IJ SOLN
INTRAMUSCULAR | Status: AC
  Filled 2012-10-26: qty 2

## 2012-10-26 MED ORDER — ROCURONIUM BROMIDE 50 MG/5ML IV SOLN
INTRAVENOUS | Status: AC
  Filled 2012-10-26: qty 1

## 2012-10-26 MED ORDER — ONDANSETRON HCL 4 MG/2ML IJ SOLN: 4.0000 mg | Freq: Once | INTRAMUSCULAR | Status: DC | PRN

## 2012-10-26 MED ORDER — FENTANYL CITRATE 0.05 MG/ML IJ SOLN
INTRAMUSCULAR | Status: DC | PRN
  Administered 2012-10-26 (×7): 50 ug via INTRAVENOUS

## 2012-10-26 MED ORDER — BUPIVACAINE HCL (PF) 0.5 % IJ SOLN
INTRAMUSCULAR | Status: DC | PRN
  Administered 2012-10-26: 10 mL

## 2012-10-26 MED ORDER — LACTATED RINGERS IV SOLN
INTRAVENOUS | Status: DC
  Administered 2012-10-26: 1000 mL via INTRAVENOUS

## 2012-10-26 SURGICAL SUPPLY — 45 items
APPLIER CLIP LAPSCP 10X32 DD (CLIP) ×2 IMPLANT
APPLIER CLIP ROT 10 11.4 M/L (STAPLE) ×2
APR CLP MED LRG 11.4X10 (STAPLE) ×1
BAG HAMPER (MISCELLANEOUS) ×2 IMPLANT
BAG SPEC RTRVL LRG 6X4 10 (ENDOMECHANICALS) ×1
CLIP APPLIE ROT 10 11.4 M/L (STAPLE) IMPLANT
CLOTH BEACON ORANGE TIMEOUT ST (SAFETY) ×2 IMPLANT
COVER LIGHT HANDLE STERIS (MISCELLANEOUS) ×4 IMPLANT
CUTTER LINEAR ENDO 35 ETS TH (STAPLE) ×1 IMPLANT
DECANTER SPIKE VIAL GLASS SM (MISCELLANEOUS) ×2 IMPLANT
DISSECTOR BLUNT TIP ENDO 5MM (MISCELLANEOUS) ×1 IMPLANT
DURAPREP 26ML APPLICATOR (WOUND CARE) ×2 IMPLANT
ELECT REM PT RETURN 9FT ADLT (ELECTROSURGICAL) ×2
ELECTRODE REM PT RTRN 9FT ADLT (ELECTROSURGICAL) ×1 IMPLANT
EVACUATOR DRAINAGE 10X20 100CC (DRAIN) IMPLANT
EVACUATOR SILICONE 100CC (DRAIN) ×2
FILTER SMOKE EVAC LAPAROSHD (FILTER) ×2 IMPLANT
FORMALIN 10 PREFIL 120ML (MISCELLANEOUS) ×2 IMPLANT
GLOVE BIO SURGEON STRL SZ7.5 (GLOVE) ×2 IMPLANT
GLOVE BIOGEL PI IND STRL 7.0 (GLOVE) IMPLANT
GLOVE BIOGEL PI INDICATOR 7.0 (GLOVE) ×2
GLOVE ECLIPSE 6.5 STRL STRAW (GLOVE) ×1 IMPLANT
GLOVE EXAM NITRILE MD LF STRL (GLOVE) ×1 IMPLANT
GLOVE SS BIOGEL STRL SZ 6.5 (GLOVE) IMPLANT
GLOVE SUPERSENSE BIOGEL SZ 6.5 (GLOVE) ×1
GOWN STRL REIN XL XLG (GOWN DISPOSABLE) ×6 IMPLANT
HEMOSTAT SNOW SURGICEL 2X4 (HEMOSTASIS) ×2 IMPLANT
INST SET LAPROSCOPIC AP (KITS) ×2 IMPLANT
IV NS IRRIG 3000ML ARTHROMATIC (IV SOLUTION) IMPLANT
KIT ROOM TURNOVER APOR (KITS) ×2 IMPLANT
KIT TROCAR LAP CHOLE (TROCAR) ×2 IMPLANT
MANIFOLD NEPTUNE II (INSTRUMENTS) ×2 IMPLANT
NS IRRIG 1000ML POUR BTL (IV SOLUTION) ×2 IMPLANT
PACK LAP CHOLE LZT030E (CUSTOM PROCEDURE TRAY) ×2 IMPLANT
PAD ARMBOARD 7.5X6 YLW CONV (MISCELLANEOUS) ×2 IMPLANT
POUCH SPECIMEN RETRIEVAL 10MM (ENDOMECHANICALS) ×2 IMPLANT
SET BASIN LINEN APH (SET/KITS/TRAYS/PACK) ×2 IMPLANT
SET TUBE IRRIG SUCTION NO TIP (IRRIGATION / IRRIGATOR) IMPLANT
SPONGE GAUZE 2X2 8PLY STRL LF (GAUZE/BANDAGES/DRESSINGS) ×5 IMPLANT
STAPLER VISISTAT (STAPLE) ×2 IMPLANT
SUT ETHILON 3 0 FSL (SUTURE) ×1 IMPLANT
SUT VICRYL 0 UR6 27IN ABS (SUTURE) ×2 IMPLANT
TAPE CLOTH SURG 4X10 WHT LF (GAUZE/BANDAGES/DRESSINGS) ×1 IMPLANT
WARMER LAPAROSCOPE (MISCELLANEOUS) ×2 IMPLANT
YANKAUER SUCT 12FT TUBE ARGYLE (SUCTIONS) ×2 IMPLANT

## 2012-10-26 NOTE — Progress Notes (Signed)
  Edward Trevino ZOX:[id number] DOB: 10/30/1931 DOA: 10/23/2012 PCP: Louie Boston, MD   Subjective: This man just had laparoscopic cholecystectomy earlier today and seems to have done well postoperatively except for hematuria which is frank hematuria. He apparently had in and out catheter and I think he probably suffered trauma.           Physical Exam: Blood pressure 141/69, pulse 82, temperature 98.6 F (37 C), temperature source Oral, resp. rate 12, height 6\' 3"  (1.905 m), weight 79.4 kg (175 lb 0.7 oz), SpO2 95.00%. Looks systemically well. Heart sounds are present without murmurs or added sounds. Lung fields are clear. Abdomen is soft and mildly tender in the right upper quadrant. Is not toxic or septic. He is alert and orientated. He is hemodynamically stable.   Investigations:     Basic Metabolic Panel:  Recent Labs  81/19/14 0617 10/26/12 0538  NA 138 139  K 3.7 3.5  CL 110 106  CO2 25 25  GLUCOSE 103* 110*  BUN 29* 16  CREATININE 0.91 0.99  CALCIUM 7.9* 8.6   Liver Function Tests:  Recent Labs  10/24/12 0637  AST 17  ALT 12  ALKPHOS 81  BILITOT 0.3  PROT 5.6*  ALBUMIN 2.8*     CBC:  Recent Labs  10/25/12 0617  10/25/12 2211 10/26/12 0538  WBC 9.2  --   --  8.4  HGB 8.0*  < > 10.0* 10.8*  HCT 22.2*  < > 28.8* 30.9*  MCV 95.3  --   --  90.4  PLT 154  --   --  206  < > = values in this interval not displayed.  No results found.    Medications: I have reviewed the patient's current medications.  Impression: 1. Abdominal pain and vomiting, likely related to cholelithiasis. Status post cholecystectomy. 2. Hematuria postoperatively status post in and out catheter. Hemodynamically stable.     Plan: 1. Monitor hematuria closely. I suspect resolve. Monitor hemoglobin. He may need urology consultation if he continues to have hematuria.  Consultants:  Surgery, Dr. Lovell Sheehan.   Procedures:  None.   Antibiotics:  Rocephin IV  started 10/23/2012.                   Code Status: Full code.  Family Communication: Discussed plan with patient at the bedside.   Disposition Plan: Home when medically stable.  Time spent: 15 minutes.   LOS: 3 days   Wilson Singer Pager [phone]  10/26/2012, 12:37 PM

## 2012-10-26 NOTE — Progress Notes (Signed)
Notified Dr. Nobie Putnam that the patient s is voiding pure blood from his penis.  I voiced to him that according to report from the OR, the patient was restless in PACU and a in and out catheter was placed.  The nurse reported that there was some blood return prior to urinating about 1,000cc of urine.  The patient now has blood dripping from his penis and is urinating a moderate amount of blood.  At times it is mixed with urine.  The patient does not c/o of the site.    The MD stated that an order would be placed for a Urology consult.    After an attempt was made to notify the Urologist, it was found that he would be out of town until Sunday.  Dr. Nobie Putnam was notified and he stated he would come to see the patient and we would continue to monitor him.  The patient has no complaints at this time except for frequency in urination.

## 2012-10-26 NOTE — Transfer of Care (Signed)
Immediate Anesthesia Transfer of Care Note  Patient: Edward Trevino  Procedure(s) Performed: Procedure(s): LAPAROSCOPIC CHOLECYSTECTOMY (N/A)  Patient Location: PACU  Anesthesia Type:General  Level of Consciousness: awake  Airway & Oxygen Therapy: Patient Spontanous Breathing and Patient connected to face mask oxygen  Post-op Assessment: Report given to PACU RN  Post vital signs: Reviewed  Complications: No apparent anesthesia complications

## 2012-10-26 NOTE — Anesthesia Postprocedure Evaluation (Addendum)
  Anesthesia Post-op Note  Patient: Edward Trevino  Procedure(s) Performed: Procedure(s): LAPAROSCOPIC CHOLECYSTECTOMY (N/A)  Patient Location: PACU  Anesthesia Type:General  Level of Consciousness: awake, alert  and oriented  Airway and Oxygen Therapy: Patient Spontanous Breathing and Patient connected to face mask oxygen  Post-op Pain: mild  Post-op Assessment: Post-op Vital signs reviewed, Patient's Cardiovascular Status Stable, Respiratory Function Stable, Patent Airway and No signs of Nausea or vomiting  Post-op Vital Signs: Reviewed and stable  Complications: No apparent anesthesia complications 10/27/12  Patient out of bed, feeling better today.  VSS.  No apparent anesthesia complications.

## 2012-10-26 NOTE — Op Note (Signed)
Patient:  Edward Trevino  DOB:  Feb 22, 1932  MRN:  [id number]   Preop Diagnosis:  Cholelithiasis  Postop Diagnosis:  Same  Procedure:  Laparoscopic cholecystectomy  Surgeon:  Franky Macho, M.D.  Anes:  General endotracheal  Indications:  Patient is a 79 year old white male who is had a known history of cholelithiasis, status post multiple ERCPs for choledocholithiasis. He now presents for lap scopic cholecystectomy. The risks and benefits of the procedure including bleeding, infection, hepatobiliary injury, the possibility of an open procedure were fully explained to the patient, who gave informed consent.  Procedure note:  The patient is placed the supine position. After induction of general endotracheal anesthesia, the abdomen was prepped and draped using usual sterile technique with DuraPrep. Surgical site confirmation was performed.  A supraumbilical incision was made down to the fascia. A Veress needle was introduced into the abdominal cavity and confirmation of placement was done using the saline drop test. The abdomen was then insufflated to 16 mm mercury pressure. An 11 mm trocar was introduced into the abdominal cavity under direct visualization without difficulty. The patient was placed in reverse Trendelenburg position and additional 11 mm trocar was placed the epigastric region and 5 mm trochars were placed the right upper quadrant and right flank regions. The liver was inspected and noted within normal limits for his age. The gallbladder was noted be significantly contracted and scarred. Multiple adhesions of omentum were freed away from the gallbladder. The gallbladder was retracted in a dynamic fashion in order to expose the triangle of Calot. Given the significant scarring of the gallbladder, it was elected to proceed with a dome down approach. The gallbladder was freed away from the liver using Bovie electrocautery. Once the gallbladder was raised on a pedicle, the cystic artery was  ligated and divided using clips. A vascular Endo GIA was placed across the cystic duct and fired. It was noted that a small aberrant branch from the hepatobiliary tree was ligated and divided. It was difficult to certain whether this was of the right hepatic duct or common bile duct as it was closely adherent to the gallbladder. The common duct was inspected and there was no evidence of injury. No bile leakage was noted at the end of the procedure. Surgicel is placed the gallbladder fossa. The gallbladder was removed using an Endo Catch bag and sent to pathology further examination. A #10 flat Jackson-Pratt drain was placed into the subhepatic space and brought out through the lateral 5 mm trocar site. It was secured at the skin level using a 3-0 nylon interrupted suture. All fluid and air were then evacuated from the abdominal cavity prior to removal of the trochars.  All wounds were irrigated normal saline. A wounds were injected with 0.5% Sensorcaine. The supraumbilical fascia as well as epigastric fascia were reapproximated using 0 Vicryl interrupted sutures. All skin incisions were closed using staples. Betadine ointment and dry sterile dressings were applied.  All tape and needle counts were correct at the end of the procedure. Patient was extubated in the operating room and transferred to PACU in stable condition.  Complications:  None  EBL:  50 cc  Specimen:  Gallbladder  Drains: Jackson-Pratt drain to subhepatic space

## 2012-10-26 NOTE — Anesthesia Preprocedure Evaluation (Signed)
Anesthesia Evaluation  Patient identified by MRN, date of birth, ID band Patient awake    Reviewed: Allergy & Precautions, H&P , NPO status , Patient's Chart, lab work & pertinent test results  History of Anesthesia Complications Negative for: history of anesthetic complications  Airway Mallampati: III TM Distance: >3 FB Neck ROM: Full    Dental  (+) Edentulous Upper and Edentulous Lower   Pulmonary Current Smoker,  breath sounds clear to auscultation        Cardiovascular negative cardio ROS  Rhythm:Regular Rate:Normal     Neuro/Psych PSYCHIATRIC DISORDERS Anxiety Depression    GI/Hepatic PUD, GERD-  Medicated and Controlled,(+)     substance abuse  alcohol use,   Endo/Other    Renal/GU      Musculoskeletal   Abdominal   Peds  Hematology  (+) Blood dyscrasia, anemia ,   Anesthesia Other Findings   Reproductive/Obstetrics                           Anesthesia Physical Anesthesia Plan  ASA: III  Anesthesia Plan: General   Post-op Pain Management:    Induction: Intravenous, Rapid sequence and Cricoid pressure planned  Airway Management Planned: Oral ETT  Additional Equipment:   Intra-op Plan:   Post-operative Plan: Extubation in OR  Informed Consent: I have reviewed the patients History and Physical, chart, labs and discussed the procedure including the risks, benefits and alternatives for the proposed anesthesia with the patient or authorized representative who has indicated his/her understanding and acceptance.     Plan Discussed with:   Anesthesia Plan Comments:         Anesthesia Quick Evaluation

## 2012-10-26 NOTE — Anesthesia Procedure Notes (Signed)
Procedure Name: Intubation Date/Time: 10/26/2012 7:44 AM Performed by: Glynn Octave E Pre-anesthesia Checklist: Patient identified, Patient being monitored, Timeout performed, Emergency Drugs available and Suction available Patient Re-evaluated:Patient Re-evaluated prior to inductionOxygen Delivery Method: Circle System Utilized Preoxygenation: Pre-oxygenation with 100% oxygen Intubation Type: IV induction, Rapid sequence and Cricoid Pressure applied Ventilation: Mask ventilation without difficulty Laryngoscope Size: Mac and 3 Grade View: Grade I Tube type: Oral Tube size: 7.0 mm Number of attempts: 1 Airway Equipment and Method: stylet Placement Confirmation: ETT inserted through vocal cords under direct vision,  positive ETCO2 and breath sounds checked- equal and bilateral Secured at: 22 cm Tube secured with: Tape Dental Injury: Teeth and Oropharynx as per pre-operative assessment

## 2012-10-27 LAB — CBC
Hemoglobin: 10.5 g/dL — ABNORMAL LOW (ref 13.0–17.0)
MCH: 32 pg (ref 26.0–34.0)
MCV: 91.2 fL (ref 78.0–100.0)
Platelets: 214 10*3/uL (ref 150–400)
RBC: 3.28 MIL/uL — ABNORMAL LOW (ref 4.22–5.81)

## 2012-10-27 LAB — COMPREHENSIVE METABOLIC PANEL
ALT: 31 U/L (ref 0–53)
AST: 44 U/L — ABNORMAL HIGH (ref 0–37)
Alkaline Phosphatase: 128 U/L — ABNORMAL HIGH (ref 39–117)
BUN: 11 mg/dL (ref 6–23)
CO2: 29 mEq/L (ref 19–32)
Calcium: 8.4 mg/dL (ref 8.4–10.5)
Chloride: 100 mEq/L (ref 96–112)
Creatinine, Ser: 1.03 mg/dL (ref 0.50–1.35)
GFR calc non Af Amer: 67 mL/min — ABNORMAL LOW (ref >90)
Potassium: 3.4 mEq/L — ABNORMAL LOW (ref 3.5–5.1)
Sodium: 136 mEq/L (ref 135–145)
Total Protein: 5.6 g/dL — ABNORMAL LOW (ref 6.0–8.3)

## 2012-10-27 LAB — PHOSPHORUS: Phosphorus: 2.9 mg/dL (ref 2.3–4.6)

## 2012-10-27 LAB — MAGNESIUM: Magnesium: 1.9 mg/dL (ref 1.5–2.5)

## 2012-10-27 MED ORDER — HYDROCODONE-ACETAMINOPHEN 5-325 MG PO TABS: 1.0000 | ORAL_TABLET | Freq: Four times a day (QID) | ORAL | Status: DC | PRN

## 2012-10-27 MED ORDER — POTASSIUM CHLORIDE CRYS ER 20 MEQ PO TBCR
40.0000 meq | EXTENDED_RELEASE_TABLET | Freq: Once | ORAL | Status: AC
  Administered 2012-10-27: 40 meq via ORAL
  Filled 2012-10-27: qty 2

## 2012-10-27 NOTE — Progress Notes (Signed)
  Edward Trevino ZOX:[id number] DOB: Sep 17, 1931 DOA: 10/23/2012 PCP: Louie Boston, MD   Subjective: This man complains of soreness in his abdomen after his cholecystectomy yesterday. Fortunately his frank hematuria is improving and it appears it is now more pink-colored. He has had no fever.           Physical Exam: Blood pressure 117/65, pulse 91, temperature 98.9 F (37.2 C), temperature source Oral, resp. rate 20, height 6\' 3"  (1.905 m), weight 79.4 kg (175 lb 0.7 oz), SpO2 95.00%. Looks systemically well. Heart sounds are present without murmurs or added sounds. Lung fields are clear. Abdomen is soft and nontender. Is not toxic or septic. He is alert and orientated. He is hemodynamically stable.   Investigations:     Basic Metabolic Panel:  Recent Labs  81/19/14 0538 10/27/12 0639  NA 139 136  K 3.5 3.4*  CL 106 100  CO2 25 29  GLUCOSE 110* 112*  BUN 16 11  CREATININE 0.99 1.03  CALCIUM 8.6 8.4  MG  --  1.9  PHOS  --  2.9   Liver Function Tests:  Recent Labs  10/27/12 0639  AST 44*  ALT 31  ALKPHOS 128*  BILITOT 0.5  PROT 5.6*  ALBUMIN 2.8*     CBC:  Recent Labs  10/26/12 0538 10/26/12 1531 10/27/12 0639  WBC 8.4  --  9.2  HGB 10.8* 11.1* 10.5*  HCT 30.9* 31.8* 29.9*  MCV 90.4  --  91.2  PLT 206  --  214    No results found.    Medications: I have reviewed the patient's current medications.  Impression: 1. Abdominal pain and vomiting, likely related to cholelithiasis. Status post cholecystectomy. 2. Hematuria postoperatively status post in and out catheter. Hemodynamically stable. Hematuria is improving.     Plan: 1. Monitor hematuria closely. I suspect resolve. Monitor hemoglobin. He may need urology consultation if he continues to have hematuria. 2. Await further surgical recommendations. 3. Probable discharge home tomorrow as I want to make sure the hematuria is resolved.  Consultants:  Surgery, Dr.  Lovell Sheehan.   Procedures:  None.   Antibiotics:  Rocephin IV started 10/23/2012.                   Code Status: Full code.  Family Communication: Discussed plan with patient at the bedside.   Disposition Plan: Home when medically stable.  Time spent: 15 minutes.   LOS: 4 days   Wilson Singer Pager [phone]  10/27/2012, 8:52 AM

## 2012-10-27 NOTE — Discharge Summary (Signed)
Physician Discharge Summary  Patient ID: Edward Trevino MRN: [id number] DOB/AGE: 1931-12-26 78 y.o.  Admit date: 10/23/2012 Discharge date: 10/27/2012  Admission Diagnoses: Cholelithiasis, anemia secondary to acute and chronic disease, history of peptic ulcer disease, history of gastric outlet obstruction  Discharge Diagnoses: Same Active Problems:   Gallstones   Abdominal pain   Nausea   Vomiting   Dehydration   Hypernatremia   Constipation   Hypokalemia   Malnutrition of moderate degree   Discharged Condition: good  Hospital Course: Patient is a 79 year old white male with a known history of cholelithiasis and choledocholithiasis was previously underwent multiple ERCPs for extraction of choledocholithiasis. He is had multiple admissions for upper abdominal pain. He also has a known history of gastric outlet structure and peptic ulcer disease with esophagitis. Surgery was consulted for elective cholecystectomy. Just prior to the surgery, he an episode of anemia of unknown etiology. He did receive 2 units of packed red blood cells and this resulted in resolution of the acute anemia. He underwent laparoscopic cholecystectomy on 10/26/2012. Tolerated the procedure well. His postoperative course has been unremarkable. His diet was advanced at difficulty. His hemoglobin has remained stable. His liver enzyme tests within normal limits. The patient is being discharged home on postoperative day one in good improving condition. He will be sent home with a Jackson-Pratt drain.  Treatments: surgery: Laparoscopic cholecystectomy on 10/26/2012  Discharge Exam: Blood pressure 102/66, pulse 95, temperature 99 F (37.2 C), temperature source Oral, resp. rate 16, height 6\' 3"  (1.905 m), weight 79.4 kg (175 lb 0.7 oz), SpO2 95.00%. General appearance: alert, cooperative, appears stated age and no distress Resp: clear to auscultation bilaterally Cardio: regular rate and rhythm, S1, S2 normal, no murmur,  click, rub or gallop GI: Soft. Dressings dry and intact. JP drainage serosanguineous and low in nature.  Disposition: 01-Home or Self Care   Future Appointments Provider Department Dept Phone   11/13/2012 10:00 AM Nira Retort, NP [facility] [phone]       Medication List    TAKE these medications       clonazePAM 0.5 MG tablet  Commonly known as:  KLONOPIN  Take 1 tablet (0.5 mg total) by mouth 3 (three) times daily. Takes one tablet twice daily and 2 tablets at bedtime.     HYDROcodone-acetaminophen 5-325 MG per tablet  Commonly known as:  NORCO  Take 1 tablet by mouth every 6 (six) hours as needed for pain.     mirtazapine 30 MG tablet  Commonly known as:  REMERON  Take 1 tablet (30 mg total) by mouth at bedtime.     multivitamin with minerals Tabs  Take 1 tablet by mouth daily.     QUEtiapine 100 MG tablet  Commonly known as:  SEROQUEL  Take 1 tablet (100 mg total) by mouth 3 (three) times daily.     venlafaxine XR 75 MG 24 hr capsule  Commonly known as:  EFFEXOR-XR  Take 1 capsule (75 mg total) by mouth daily.     vitamin C 500 MG tablet  Commonly known as:  ASCORBIC ACID  Take 500 mg by mouth daily.           Follow-up Information   Follow up with Fabio Bering, MD. Schedule an appointment as soon as possible for a visit on 11/01/2012.   Contact information:   [address] [place] [address] [phone]       Signed: Franky Macho A 10/27/2012, 10:29 AM

## 2012-10-29 LAB — TYPE AND SCREEN
ABO/RH(D): O POS
Unit division: 0
Unit division: 0

## 2012-10-30 ENCOUNTER — Emergency Department (HOSPITAL_COMMUNITY): Payer: Medicare Other

## 2012-10-30 ENCOUNTER — Encounter (HOSPITAL_COMMUNITY): Payer: Self-pay | Admitting: Emergency Medicine

## 2012-10-30 ENCOUNTER — Inpatient Hospital Stay (HOSPITAL_COMMUNITY)
Admission: EM | Admit: 2012-10-30 | Discharge: 2012-11-05 | DRG: 983 | Disposition: A | Discharge status: Home Health | Payer: Medicare Other | Attending: General Surgery | Admitting: General Surgery

## 2012-10-30 DIAGNOSIS — G8929 Other chronic pain: Secondary | ICD-10-CM | POA: Diagnosis present

## 2012-10-30 DIAGNOSIS — F17200 Nicotine dependence, unspecified, uncomplicated: Secondary | ICD-10-CM | POA: Diagnosis present

## 2012-10-30 DIAGNOSIS — F329 Major depressive disorder, single episode, unspecified: Secondary | ICD-10-CM | POA: Diagnosis present

## 2012-10-30 DIAGNOSIS — G4700 Insomnia, unspecified: Secondary | ICD-10-CM | POA: Diagnosis present

## 2012-10-30 DIAGNOSIS — F3289 Other specified depressive episodes: Secondary | ICD-10-CM | POA: Diagnosis present

## 2012-10-30 DIAGNOSIS — M795 Residual foreign body in soft tissue: Principal | ICD-10-CM | POA: Diagnosis present

## 2012-10-30 DIAGNOSIS — R627 Adult failure to thrive: Secondary | ICD-10-CM | POA: Diagnosis present

## 2012-10-30 DIAGNOSIS — K8051 Calculus of bile duct without cholangitis or cholecystitis with obstruction: Secondary | ICD-10-CM

## 2012-10-30 DIAGNOSIS — K5900 Constipation, unspecified: Secondary | ICD-10-CM | POA: Diagnosis present

## 2012-10-30 DIAGNOSIS — Z182 Retained plastic fragments: Secondary | ICD-10-CM

## 2012-10-30 DIAGNOSIS — H9190 Unspecified hearing loss, unspecified ear: Secondary | ICD-10-CM | POA: Diagnosis present

## 2012-10-30 DIAGNOSIS — F411 Generalized anxiety disorder: Secondary | ICD-10-CM | POA: Diagnosis present

## 2012-10-30 DIAGNOSIS — T81509A Unspecified complication of foreign body accidentally left in body following unspecified procedure, initial encounter: Secondary | ICD-10-CM

## 2012-10-30 DIAGNOSIS — E440 Moderate protein-calorie malnutrition: Secondary | ICD-10-CM

## 2012-10-30 DIAGNOSIS — R1110 Vomiting, unspecified: Secondary | ICD-10-CM

## 2012-10-30 DIAGNOSIS — F0390 Unspecified dementia without behavioral disturbance: Secondary | ICD-10-CM | POA: Diagnosis present

## 2012-10-30 DIAGNOSIS — R109 Unspecified abdominal pain: Secondary | ICD-10-CM

## 2012-10-30 LAB — CBC WITH DIFFERENTIAL/PLATELET
Eosinophils Absolute: 0.1 10*3/uL (ref 0.0–0.7)
Hemoglobin: 11.2 g/dL — ABNORMAL LOW (ref 13.0–17.0)
Lymphs Abs: 1.1 10*3/uL (ref 0.7–4.0)
MCH: 31.4 pg (ref 26.0–34.0)
MCHC: 33.3 g/dL (ref 30.0–36.0)
Monocytes Absolute: 0.9 10*3/uL (ref 0.1–1.0)
Monocytes Relative: 6 % (ref 3–12)
Neutro Abs: 11.8 10*3/uL — ABNORMAL HIGH (ref 1.7–7.7)
Neutrophils Relative %: 85 % — ABNORMAL HIGH (ref 43–77)
RBC: 3.57 MIL/uL — ABNORMAL LOW (ref 4.22–5.81)

## 2012-10-30 LAB — COMPREHENSIVE METABOLIC PANEL
ALT: 61 U/L — ABNORMAL HIGH (ref 0–53)
Albumin: 3.1 g/dL — ABNORMAL LOW (ref 3.5–5.2)
Alkaline Phosphatase: 302 U/L — ABNORMAL HIGH (ref 39–117)
BUN: 13 mg/dL (ref 6–23)
Chloride: 97 mEq/L (ref 96–112)
Creatinine, Ser: 1.16 mg/dL (ref 0.50–1.35)
GFR calc Af Amer: 68 mL/min — ABNORMAL LOW (ref >90)
Glucose, Bld: 145 mg/dL — ABNORMAL HIGH (ref 70–99)
Potassium: 3.5 mEq/L (ref 3.5–5.1)
Total Bilirubin: 0.6 mg/dL (ref 0.3–1.2)
Total Protein: 7.2 g/dL (ref 6.0–8.3)

## 2012-10-30 LAB — LIPASE, BLOOD: Lipase: 27 U/L (ref 11–59)

## 2012-10-30 MED ORDER — ENOXAPARIN SODIUM 40 MG/0.4ML ~~LOC~~ SOLN
40.0000 mg | SUBCUTANEOUS | Status: DC
  Administered 2012-10-30 – 2012-11-04 (×6): 40 mg via SUBCUTANEOUS
  Filled 2012-10-30 (×6): qty 0.4

## 2012-10-30 MED ORDER — ONDANSETRON HCL 4 MG/2ML IJ SOLN
4.0000 mg | Freq: Once | INTRAMUSCULAR | Status: AC
  Administered 2012-10-30: 4 mg via INTRAVENOUS
  Filled 2012-10-30: qty 2

## 2012-10-30 MED ORDER — MORPHINE SULFATE 4 MG/ML IJ SOLN
4.0000 mg | Freq: Once | INTRAMUSCULAR | Status: AC
  Administered 2012-10-30: 4 mg via INTRAVENOUS
  Filled 2012-10-30: qty 1

## 2012-10-30 MED ORDER — ONDANSETRON HCL 4 MG/2ML IJ SOLN
INTRAMUSCULAR | Status: AC
  Administered 2012-10-30: 4 mg via INTRAVENOUS
  Filled 2012-10-30: qty 2

## 2012-10-30 MED ORDER — ONDANSETRON HCL 4 MG/2ML IJ SOLN
4.0000 mg | Freq: Four times a day (QID) | INTRAMUSCULAR | Status: DC | PRN
  Administered 2012-10-30: 4 mg via INTRAVENOUS
  Filled 2012-10-30: qty 2

## 2012-10-30 MED ORDER — MORPHINE SULFATE 2 MG/ML IJ SOLN
1.0000 mg | INTRAMUSCULAR | Status: DC | PRN
  Administered 2012-10-30 (×2): 2 mg via INTRAVENOUS
  Administered 2012-10-31: 4 mg via INTRAVENOUS
  Filled 2012-10-30: qty 1
  Filled 2012-10-30: qty 2
  Filled 2012-10-30: qty 1

## 2012-10-30 MED ORDER — IOHEXOL 300 MG/ML  SOLN
100.0000 mL | Freq: Once | INTRAMUSCULAR | Status: AC | PRN
  Administered 2012-10-30: 100 mL via INTRAVENOUS

## 2012-10-30 MED ORDER — LACTATED RINGERS IV SOLN
INTRAVENOUS | Status: DC
  Administered 2012-10-30 – 2012-10-31 (×2): via INTRAVENOUS

## 2012-10-30 MED ORDER — ONDANSETRON HCL 4 MG/2ML IJ SOLN: 4.0000 mg | Freq: Once | INTRAMUSCULAR | Status: AC

## 2012-10-30 MED ORDER — PANTOPRAZOLE SODIUM 40 MG IV SOLR
40.0000 mg | Freq: Every day | INTRAVENOUS | Status: DC
  Administered 2012-10-30 – 2012-10-31 (×2): 40 mg via INTRAVENOUS
  Filled 2012-10-30 (×2): qty 40

## 2012-10-30 MED ORDER — SODIUM CHLORIDE 0.9 % IV BOLUS (SEPSIS)
500.0000 mL | Freq: Once | INTRAVENOUS | Status: AC
  Administered 2012-10-30: 500 mL via INTRAVENOUS

## 2012-10-30 MED ORDER — IOHEXOL 300 MG/ML  SOLN
50.0000 mL | Freq: Once | INTRAMUSCULAR | Status: AC | PRN
  Administered 2012-10-30: 50 mL via ORAL

## 2012-10-30 NOTE — ED Provider Notes (Signed)
History     CSN: [id number]  Arrival date & time 10/30/12  1406   First MD Initiated Contact with Patient 10/30/12 1504      Chief Complaint  Patient presents with  . Abdominal Pain  . Nausea  . Constipation    (Consider location/radiation/quality/duration/timing/severity/associated sxs/prior treatment) Patient is a 79 y.o. male presenting with abdominal pain and constipation. The history is provided by the patient and a friend.  Abdominal Pain Associated symptoms include abdominal pain.  Constipation Associated symptoms: abdominal pain, nausea and vomiting   Associated symptoms: no fever    patient's had abdominal pain. acute on chronic. He had a cholecystectomy on the 14th. He states his pain has not gotten better since then. He states he has had nausea vomiting.he states the pain is over his whole abdomen He states he feels as if he has to throw up. No fevers. He states he has not passed gas or had a bowel movement since. He states he cannot tell me if his abdomen is getting larger. No dysuria. States she also sometimes feels some chest pain and his heart will beat quickly. No blood in the emesis. He is a somewhat poor historian. The nurse states his JP drain was pulled out last night accidentally. Patient cannot tell me the drain was in or not.  Past Medical History  Diagnosis Date  . Depression   . Insomnia   . Anxiety   . Suicide attempt   . Erosive esophagitis 07/25/2012  . Gastric out let obstruction 07/25/2012  . HOH (hard of hearing)     Past Surgical History  Procedure Laterality Date  . Appendectomy    . Back surgery    . Ercp N/A 07/24/2012    Dr. Jena Gauss. Significant abnormalities of the bowl and proximal second portion producing partial gastric outlet stricture and with secondary gastric dilation and severe erosive reflux esophagitis. Biopsy showed benign ulceration. Normal-appearing ampulla, status post biliary sphincterotomy and ampulla balloon dilation, stone  extraction and stent placement.  Marland Kitchen Sphincterotomy N/A 07/24/2012    Procedure: SPHINCTEROTOMY;  Surgeon: Corbin Ade, MD;  Location: [facility];  Service: Endoscopy;  Laterality: N/A;  . Esophageal biopsy N/A 07/24/2012    Procedure: BIOPSY;  Surgeon: Corbin Ade, MD;  Location: [facility];  Service: Endoscopy;  Laterality: N/A;  Duodenal and Esophageal Biopsies  . Esophagogastroduodenoscopy N/A 07/24/2012    NWG:[id number] ampulla s/p biliary sphincterotomy and ampullary  . Balloon dilation N/A 07/24/2012    Procedure: BALLOON DILATION;  Surgeon: Corbin Ade, MD;  Location: [facility];  Service: Endoscopy;  Laterality: N/A;  Balloon Stone Extraction, Drudging  . Removal of stones N/A 07/24/2012    Procedure: REMOVAL OF STONES;  Surgeon: Corbin Ade, MD;  Location: [facility];  Service: Endoscopy;  Laterality: N/A;  . Egd/ercp  10/18/2012    Dr. Jena Gauss: ;yloric channel stenosis, s/p dilation and bx, persisting CBD stones with extension of sphincterotomy and sphincterotomy balloon dilation and stone extraction. Removal of biliary stent  . Esophagogastroduodenoscopy (egd) with propofol N/A 10/18/2012    Procedure: ESOPHAGOGASTRODUODENOSCOPY (EGD) WITH PROPOFOL;  Surgeon: Corbin Ade, MD;  Location: [facility];  Service: Endoscopy;  Laterality: N/A;  . Ercp N/A 10/18/2012    Procedure: ENDOSCOPIC RETROGRADE CHOLANGIOPANCREATOGRAPHY (ERCP);  Surgeon: Corbin Ade, MD;  Location: [facility];  Service: Endoscopy;  Laterality: N/A;  . Removal of stones N/A 10/18/2012    Procedure: REMOVAL OF STONES;  Surgeon: Corbin Ade, MD;  Location: [facility];  Service: Endoscopy;  Laterality: N/A;  . Esophageal biopsy N/A 10/18/2012    Procedure: BIOPSY;  Surgeon: Corbin Ade, MD;  Location: [facility];  Service: Endoscopy;  Laterality: N/A;    Family History  Problem Relation Age of Onset  . Colon cancer Neg Hx     History  Substance Use Topics  . Smoking status: Current Every Day Smoker    Types: Cigars  . Smokeless  tobacco: Not on file  . Alcohol Use: No     Comment: reported from ED son stated he has been drinking a lot of wine recently      Review of Systems  Constitutional: Negative for fever and appetite change.  Gastrointestinal: Positive for nausea, vomiting, abdominal pain and constipation.  Genitourinary: Negative for flank pain.  Skin: Negative for rash and wound.    Allergies  Review of patient's allergies indicates no known allergies.  Home Medications   No current outpatient prescriptions on file.  BP 154/75  Pulse 96  Temp(Src) 98.2 F (36.8 C) (Oral)  Resp 16  Ht 6\' 3"  (1.905 m)  Wt 175 lb (79.379 kg)  BMI 21.87 kg/m2  SpO2 96%  Physical Exam  Nursing note and vitals reviewed. Constitutional: He is oriented to person, place, and time. He appears well-developed and well-nourished.  HENT:  Head: Normocephalic and atraumatic.  Eyes: EOM are normal. Pupils are equal, round, and reactive to light.  Neck: Normal range of motion. Neck supple.  Cardiovascular: Normal rate, regular rhythm and normal heart sounds.   No murmur heard. Pulmonary/Chest: Effort normal and breath sounds normal.  Abdominal: Soft. Bowel sounds are normal. He exhibits distension. He exhibits no mass. There is tenderness. There is no rebound and no guarding.  Some distention. No JP drain in right upper quadrant, although there is a suture or 1 probably was. Well-healing surgical trocar sites  Musculoskeletal: Normal range of motion. He exhibits no edema.  Neurological: He is alert and oriented to person, place, and time. No cranial nerve deficit.  Skin: Skin is warm and dry.  Psychiatric: He has a normal mood and affect.    ED Course  Procedures (including critical care time)  Labs Reviewed  CBC WITH DIFFERENTIAL - Abnormal; Notable for the following:    WBC 13.9 (*)    RBC 3.57 (*)    Hemoglobin 11.2 (*)    HCT 33.6 (*)    Platelets 403 (*)    Neutrophils Relative % 85 (*)    Neutro Abs  11.8 (*)    Lymphocytes Relative 8 (*)    All other components within normal limits  COMPREHENSIVE METABOLIC PANEL - Abnormal; Notable for the following:    CO2 34 (*)    Glucose, Bld 145 (*)    Albumin 3.1 (*)    AST 60 (*)    ALT 61 (*)    Alkaline Phosphatase 302 (*)    GFR calc non Af Amer 58 (*)    GFR calc Af Amer 68 (*)    All other components within normal limits  LIPASE, BLOOD  COMPREHENSIVE METABOLIC PANEL  CBC   Ct Abdomen Pelvis W Contrast  10/30/2012   *RADIOLOGY REPORT*  Clinical Data: Abdominal pain.  Recent cholecystectomy.  Evaluate for retained surgical drain.  CT ABDOMEN AND PELVIS WITH CONTRAST  Technique:  Multidetector CT imaging of the abdomen and pelvis was performed following the standard protocol during bolus administration of intravenous contrast.  Contrast: 50mL OMNIPAQUE IOHEXOL 300 MG/ML  SOLN, OMNIPAQUE IOHEXOL 300 MG/ML  SOLN  Comparison: 06/06/2014and abdominal image 10/30/2012  Findings: Calcified granulomas at the lung bases. No evidence for pneumoperitoneum.  There is a surgical drain in the right upper quadrant along the inferior aspect of the liver.  The drain does not extend to the skin.  The end of the drain is just caudal to the skin site and approximately 3 cm below the skin on image 33.  The drain appears to be intra-abdominal.  There is a small amount of fluid around the drain and along the right lateral abdomen.  There is low density material in the gallbladder fossa which may be postsurgical material.  There is mild intrahepatic biliary dilatation. There is stranding and a small focus of air within the porta hepatis which may be related to the drain and recent surgery.  Again noted is diffuse wall thickening of the distal esophagus consistent with esophagitis.  There is also persistent dilatation of the stomach.  The descending duodenum is distended with air and fluid.  There are inflammatory changes in the right upper quadrant around the surgical  drain and hepatic flexure.  No gross abnormality to the kidneys, adrenal glands, spleen or pancreas.  Atherosclerotic disease of the abdominal aorta without aneurysmal dilatation.  Prostate is prominent, measuring 5.4 cm in the transverse dimension.  The urinary bladder is moderately distended and similar to the previous examination.  No significant free fluid or pelvis.  No acute bony abnormality. Bilateral small inguinal hernias containing fat.  IMPRESSION: There is a retained surgical drain within the right upper quadrant of the abdomen.  Small amount of fluid around the small bore tube. The retained drain is within the intra-abdominal cavity.  Chronic wall thickening and inflammation of the distal esophagus.  Persistent dilatation of the stomach.  There is also dilatation of the descending duodenum.   Original Report Authenticated By: Richarda Overlie, M.D.   Dg Abd Acute W/chest  10/30/2012   *RADIOLOGY REPORT*  Clinical Data: Abdominal pain, nausea, constipation  ACUTE ABDOMEN SERIES (ABDOMEN 2 VIEW & CHEST 1 VIEW)  Comparison: Chest radiograph 10/23/2012  Findings: Normal mediastinum and heart silhouette.  There is bilateral apical scarring which is similar to prior.  There is retained oral contrast within the colonic stool.  No dilated loops of large or small bowel.  There are multiple surgical clips in the right upper quadrant and skin staples along the mid upper abdomen.  There is surgical drain in the right upper quadrant .  There is gas within the stomach on the upright exam.  No clear evidence of intraperitoneal free air.  IMPRESSION:  1.  No acute cardiopulmonary findings.  2.  No evidence of bowel obstruction or free air. 3.  Retained contrast within the stool of the colon.  4.  Surgical drain in the right upper quadrant.   Original Report Authenticated By: Genevive Bi, M.D.     1. Abdominal pain   2. Vomiting   3. Portion of Al Pimple drain retained after removal, initial encounter        MDM  Patient with abdominal pain and recent surgery. He's had chronic abdominal pain also has had nausea and vomiting.Cathlean Sauer shows JP drain, however family states it has been pulled out at home accidentally. There is no JP drain vision: The patient. CT scan was done and shows a intra-abdominal JP drain with no extension to the skin. Patient be admitted to general surgery.  Juliet Rude. Rubin Payor, MD 10/30/12 [phone]

## 2012-10-30 NOTE — ED Notes (Signed)
Pt c/o abd pain/n/constipation x 2-3 days. Pt also states he feels sore in his chest and like his heart is beating fast at times. Pt had cholecystectomy 10/26/12. Friend of family states pt pulled of "drainage bag" from abdomen last night while sleeping. 3 dressings to abdomen noted.  Nad noted.

## 2012-10-30 NOTE — ED Notes (Signed)
Pt reports contrast media makes him more nauseous and that it tastes bad. Pt refused to drink contrast. EDP aware and reported to scan pt without CT. CT aware.

## 2012-10-30 NOTE — ED Notes (Signed)
Pt's son [name]: [phone]

## 2012-10-31 ENCOUNTER — Encounter (HOSPITAL_COMMUNITY): Payer: Self-pay | Admitting: Anesthesiology

## 2012-10-31 ENCOUNTER — Encounter (HOSPITAL_COMMUNITY)
Admission: EM | Disposition: A | Discharge status: Home Health | Payer: Self-pay | Source: Home / Self Care | Attending: General Surgery

## 2012-10-31 ENCOUNTER — Inpatient Hospital Stay (HOSPITAL_COMMUNITY): Payer: Medicare Other | Admitting: Anesthesiology

## 2012-10-31 ENCOUNTER — Encounter (HOSPITAL_COMMUNITY): Payer: Self-pay | Admitting: *Deleted

## 2012-10-31 HISTORY — PX: LAPAROSCOPY: SHX197

## 2012-10-31 LAB — COMPREHENSIVE METABOLIC PANEL
BUN: 14 mg/dL (ref 6–23)
CO2: 32 mEq/L (ref 19–32)
Calcium: 8.3 mg/dL — ABNORMAL LOW (ref 8.4–10.5)
Creatinine, Ser: 1.01 mg/dL (ref 0.50–1.35)
GFR calc Af Amer: 80 mL/min — ABNORMAL LOW (ref >90)
GFR calc non Af Amer: 69 mL/min — ABNORMAL LOW (ref >90)
Glucose, Bld: 167 mg/dL — ABNORMAL HIGH (ref 70–99)
Potassium: 4.3 mEq/L (ref 3.5–5.1)

## 2012-10-31 LAB — SURGICAL PCR SCREEN
MRSA, PCR: NEGATIVE
Staphylococcus aureus: NEGATIVE

## 2012-10-31 LAB — CBC
Hemoglobin: 10.3 g/dL — ABNORMAL LOW (ref 13.0–17.0)
RBC: 3.25 MIL/uL — ABNORMAL LOW (ref 4.22–5.81)
WBC: 16.5 10*3/uL — ABNORMAL HIGH (ref 4.0–10.5)

## 2012-10-31 SURGERY — LAPAROSCOPY, DIAGNOSTIC
Anesthesia: General | Site: Abdomen | Wound class: Clean Contaminated

## 2012-10-31 MED ORDER — SODIUM CHLORIDE 0.9 % IR SOLN
Status: DC | PRN
  Administered 2012-10-31: 1000 mL

## 2012-10-31 MED ORDER — ONDANSETRON HCL 4 MG/2ML IJ SOLN
INTRAMUSCULAR | Status: AC
  Filled 2012-10-31: qty 2

## 2012-10-31 MED ORDER — PROPOFOL 10 MG/ML IV EMUL
INTRAVENOUS | Status: AC
  Filled 2012-10-31: qty 20

## 2012-10-31 MED ORDER — ONDANSETRON HCL 4 MG/2ML IJ SOLN: 4.0000 mg | Freq: Three times a day (TID) | INTRAMUSCULAR | Status: AC | PRN

## 2012-10-31 MED ORDER — HYDROCODONE-ACETAMINOPHEN 5-325 MG PO TABS
1.0000 | ORAL_TABLET | ORAL | Status: DC | PRN
  Administered 2012-10-31 – 2012-11-01 (×2): 1 via ORAL
  Filled 2012-10-31 (×2): qty 1

## 2012-10-31 MED ORDER — CEFOXITIN SODIUM 2 G IV SOLR
2.0000 g | INTRAVENOUS | Status: AC
  Administered 2012-10-31: 2 g via INTRAVENOUS
  Filled 2012-10-31: qty 2

## 2012-10-31 MED ORDER — BUPIVACAINE HCL (PF) 0.5 % IJ SOLN
INTRAMUSCULAR | Status: DC | PRN
  Administered 2012-10-31: 10 mL

## 2012-10-31 MED ORDER — LIDOCAINE HCL (CARDIAC) 20 MG/ML IV SOLN
INTRAVENOUS | Status: DC | PRN
  Administered 2012-10-31: 50 mg via INTRAVENOUS

## 2012-10-31 MED ORDER — FENTANYL CITRATE 0.05 MG/ML IJ SOLN
INTRAMUSCULAR | Status: AC
  Filled 2012-10-31: qty 5

## 2012-10-31 MED ORDER — CLONAZEPAM 0.5 MG PO TABS
0.5000 mg | ORAL_TABLET | ORAL | Status: DC
  Administered 2012-10-31 – 2012-11-05 (×11): 0.5 mg via ORAL
  Filled 2012-10-31 (×11): qty 1

## 2012-10-31 MED ORDER — ADULT MULTIVITAMIN W/MINERALS CH
1.0000 | ORAL_TABLET | Freq: Every day | ORAL | Status: DC
  Administered 2012-10-31 – 2012-11-05 (×6): 1 via ORAL
  Filled 2012-10-31 (×6): qty 1

## 2012-10-31 MED ORDER — PROPOFOL 10 MG/ML IV BOLUS
INTRAVENOUS | Status: DC | PRN
  Administered 2012-10-31: 150 mg via INTRAVENOUS

## 2012-10-31 MED ORDER — MORPHINE SULFATE 2 MG/ML IJ SOLN: 1.0000 mg | INTRAMUSCULAR | Status: DC | PRN

## 2012-10-31 MED ORDER — FENTANYL CITRATE 0.05 MG/ML IJ SOLN: 25.0000 ug | INTRAMUSCULAR | Status: DC | PRN

## 2012-10-31 MED ORDER — LIDOCAINE HCL (PF) 1 % IJ SOLN
INTRAMUSCULAR | Status: AC
  Filled 2012-10-31: qty 5

## 2012-10-31 MED ORDER — SUCCINYLCHOLINE CHLORIDE 20 MG/ML IJ SOLN
INTRAMUSCULAR | Status: AC
  Filled 2012-10-31: qty 1

## 2012-10-31 MED ORDER — QUETIAPINE FUMARATE 100 MG PO TABS
100.0000 mg | ORAL_TABLET | Freq: Three times a day (TID) | ORAL | Status: DC
  Administered 2012-10-31 – 2012-11-05 (×15): 100 mg via ORAL
  Filled 2012-10-31 (×15): qty 1

## 2012-10-31 MED ORDER — MIRTAZAPINE 30 MG PO TABS
30.0000 mg | ORAL_TABLET | Freq: Every day | ORAL | Status: DC
  Administered 2012-10-31 – 2012-11-03 (×4): 30 mg via ORAL
  Filled 2012-10-31 (×4): qty 1

## 2012-10-31 MED ORDER — MIDAZOLAM HCL 2 MG/2ML IJ SOLN
INTRAMUSCULAR | Status: AC
  Filled 2012-10-31: qty 2

## 2012-10-31 MED ORDER — ENSURE PUDDING PO PUDG
1.0000 | Freq: Three times a day (TID) | ORAL | Status: DC
  Administered 2012-10-31 – 2012-11-05 (×8): 1 via ORAL

## 2012-10-31 MED ORDER — ROCURONIUM BROMIDE 50 MG/5ML IV SOLN
INTRAVENOUS | Status: AC
  Filled 2012-10-31: qty 1

## 2012-10-31 MED ORDER — LACTATED RINGERS IV SOLN
INTRAVENOUS | Status: DC
Start: 2012-10-31 — End: 2012-10-31

## 2012-10-31 MED ORDER — ONDANSETRON HCL 4 MG/2ML IJ SOLN: 4.0000 mg | Freq: Once | INTRAMUSCULAR | Status: DC | PRN

## 2012-10-31 MED ORDER — BOOST / RESOURCE BREEZE PO LIQD
1.0000 | Freq: Three times a day (TID) | ORAL | Status: DC
  Administered 2012-10-31 – 2012-11-05 (×11): 1 via ORAL

## 2012-10-31 MED ORDER — CLONAZEPAM 0.5 MG PO TABS
1.0000 mg | ORAL_TABLET | Freq: Every day | ORAL | Status: DC
  Administered 2012-10-31 – 2012-11-04 (×5): 1 mg via ORAL
  Filled 2012-10-31 (×3): qty 2
  Filled 2012-10-31 (×2): qty 1
  Filled 2012-10-31: qty 2

## 2012-10-31 MED ORDER — SUCCINYLCHOLINE CHLORIDE 20 MG/ML IJ SOLN
INTRAMUSCULAR | Status: DC | PRN
  Administered 2012-10-31: 140 mg via INTRAVENOUS

## 2012-10-31 MED ORDER — ONDANSETRON HCL 4 MG/2ML IJ SOLN
4.0000 mg | Freq: Once | INTRAMUSCULAR | Status: AC
  Administered 2012-10-31: 4 mg via INTRAVENOUS

## 2012-10-31 MED ORDER — DEXTROSE-NACL 5-0.45 % IV SOLN: INTRAVENOUS | Status: AC

## 2012-10-31 MED ORDER — FENTANYL CITRATE 0.05 MG/ML IJ SOLN
INTRAMUSCULAR | Status: DC | PRN
  Administered 2012-10-31 (×2): 50 ug via INTRAVENOUS

## 2012-10-31 MED ORDER — VENLAFAXINE HCL ER 75 MG PO CP24
75.0000 mg | ORAL_CAPSULE | Freq: Every day | ORAL | Status: DC
  Administered 2012-10-31 – 2012-11-05 (×6): 75 mg via ORAL
  Filled 2012-10-31 (×6): qty 1

## 2012-10-31 MED ORDER — CLONAZEPAM 0.5 MG PO TABS: 0.5000 mg | ORAL_TABLET | Freq: Three times a day (TID) | ORAL | Status: DC

## 2012-10-31 MED ORDER — MIDAZOLAM HCL 2 MG/2ML IJ SOLN
1.0000 mg | INTRAMUSCULAR | Status: DC | PRN
  Administered 2012-10-31 (×2): 2 mg via INTRAVENOUS

## 2012-10-31 MED ORDER — BUPIVACAINE HCL (PF) 0.5 % IJ SOLN
INTRAMUSCULAR | Status: AC
  Filled 2012-10-31: qty 30

## 2012-10-31 MED ORDER — ARTIFICIAL TEARS OP OINT
TOPICAL_OINTMENT | OPHTHALMIC | Status: AC
  Filled 2012-10-31: qty 7

## 2012-10-31 SURGICAL SUPPLY — 46 items
APPLIER CLIP ROT 10 11.4 M/L (STAPLE)
APR CLP MED LRG 11.4X10 (STAPLE)
BAG HAMPER (MISCELLANEOUS) ×2 IMPLANT
BAG SPEC RTRVL LRG 6X4 10 (ENDOMECHANICALS)
CLIP APPLIE ROT 10 11.4 M/L (STAPLE) ×1 IMPLANT
CLOTH BEACON ORANGE TIMEOUT ST (SAFETY) ×2 IMPLANT
COVER LIGHT HANDLE STERIS (MISCELLANEOUS) ×2 IMPLANT
CUTTER LINEAR ENDO 35 ETS TH (STAPLE) ×1 IMPLANT
DECANTER SPIKE VIAL GLASS SM (MISCELLANEOUS) ×2 IMPLANT
DISSECTOR BLUNT TIP ENDO 5MM (MISCELLANEOUS) ×1 IMPLANT
DURAPREP 26ML APPLICATOR (WOUND CARE) ×2 IMPLANT
ELECT REM PT RETURN 9FT ADLT (ELECTROSURGICAL) ×2
ELECTRODE REM PT RTRN 9FT ADLT (ELECTROSURGICAL) ×1 IMPLANT
FILTER SMOKE EVAC LAPAROSHD (FILTER) ×1 IMPLANT
FORMALIN 10 PREFIL 480ML (MISCELLANEOUS) ×1 IMPLANT
GLOVE BIOGEL PI IND STRL 7.5 (GLOVE) ×1 IMPLANT
GLOVE BIOGEL PI INDICATOR 7.5 (GLOVE) ×1
GLOVE ECLIPSE 7.0 STRL STRAW (GLOVE) ×2 IMPLANT
GOWN STRL REIN XL XLG (GOWN DISPOSABLE) ×5 IMPLANT
INST SET LAPROSCOPIC AP (KITS) ×2 IMPLANT
IV NS IRRIG 3000ML ARTHROMATIC (IV SOLUTION) ×1 IMPLANT
KIT REMOVER STAPLE SKIN (MISCELLANEOUS) ×1 IMPLANT
KIT ROOM TURNOVER APOR (KITS) ×2 IMPLANT
LIGASURE LAP ATLAS 10MM 37CM (INSTRUMENTS) ×2 IMPLANT
MANIFOLD NEPTUNE II (INSTRUMENTS) ×2 IMPLANT
NDL INSUFFLATION 14GA 120MM (NEEDLE) ×1 IMPLANT
NEEDLE INSUFFLATION 14GA 120MM (NEEDLE) ×2 IMPLANT
NS IRRIG 1000ML POUR BTL (IV SOLUTION) ×2 IMPLANT
PACK LAP CHOLE LZT030E (CUSTOM PROCEDURE TRAY) ×2 IMPLANT
PAD ARMBOARD 7.5X6 YLW CONV (MISCELLANEOUS) ×2 IMPLANT
POUCH SPECIMEN RETRIEVAL 10MM (ENDOMECHANICALS) ×1 IMPLANT
RELOAD /EVU35 (ENDOMECHANICALS) ×1 IMPLANT
RELOAD CUTTER ETS 35MM STAND (ENDOMECHANICALS) ×1 IMPLANT
SCALPEL HARMONIC ACE (MISCELLANEOUS) ×1 IMPLANT
SET BASIN LINEN APH (SET/KITS/TRAYS/PACK) ×2 IMPLANT
SET TUBE IRRIG SUCTION NO TIP (IRRIGATION / IRRIGATOR) ×2 IMPLANT
SPONGE GAUZE 2X2 8PLY STRL LF (GAUZE/BANDAGES/DRESSINGS) ×3 IMPLANT
STAPLER VISISTAT (STAPLE) ×2 IMPLANT
SUT VICRYL 0 UR6 27IN ABS (SUTURE) ×1 IMPLANT
TAPE CLOTH SURG 4X10 WHT LF (GAUZE/BANDAGES/DRESSINGS) ×1 IMPLANT
TOWEL OR 17X26 4PK STRL BLUE (TOWEL DISPOSABLE) ×1 IMPLANT
TRAY FOLEY CATH 14FR (SET/KITS/TRAYS/PACK) ×1 IMPLANT
TROCAR Z-THRD FIOS HNDL 11X100 (TROCAR) ×2 IMPLANT
TROCAR Z-THREAD FIOS 5X100MM (TROCAR) ×2 IMPLANT
TROCAR Z-THREAD SLEEVE 11X100 (TROCAR) ×2 IMPLANT
WARMER LAPAROSCOPE (MISCELLANEOUS) ×2 IMPLANT

## 2012-10-31 NOTE — Op Note (Signed)
Patient:  Edward Trevino  DOB:  [birth year]  MRN:  [id number]   Preop Diagnosis:  Retained Jackson-Pratt drain  Postop Diagnosis:  The same  Procedure:  Laparoscopic drain removal  Surgeon:  Dr. Tilford Pillar  Anes:  General endotracheal, 0.5% Sensorcaine plain for local  Indications:  Patient is a 79 year old male who returns to [facility] with nausea vomiting abdominal pain. Apparently the patient had some confusion during his initial postoperative course following a recent laparoscopic cholecystectomy. At that time he JP drain had been placed. With this confusion as well as his other symptomatology at some point the patient had an attempted removal of the drain. It is unclear whether the patient truly broke the drain versus cup the drain however based on the findings of several segments of the drain is suspected that the drain was actually cut. Upon his return to the emergency department ACT was obtained which demonstrated the drain in the right upper quadrant. Risks benefits and alternatives of a laparoscopic retrieval were discussed at length with the patient's family. Their questions concerns addressed the patient was consented for the planned procedure.  Procedure note:  Patient was taken to the operator is placed in supine position the or table time the general anesthetic is a Optician, dispensing. Once patient was asleep he was endotracheally intubated by the nurse anesthetist. At this point her abdomen is prepped with DuraPrep solution draped in standard fashion. Time out was performed. The 11 mm trocar site staples are removed. At this point a Coker clamp was utilized grasp the anterior normal fascia at the umbilical trocar site with this anteriorly. A Veress needle was inserted. Saline drop test confirmed intraperitoneal placement the pneumoperitoneum was initiated. Once sufficient pneumoperitoneum was obtained an 11 mm insert overlap scope line visualization the trocar entering into the  peritoneal cavity. At this point the inner cannula was removed the laparoscope was reinserted there is no evidence of any trocar or Veress needle placement injury. The epigastric 11 mm trocar was placed under direct visualization through the same initial skin incision site. At this point I was able to localize the end of the drain. This is grasped and removed through the epigastric trocar site. The omentum was noted to be firmly pexed to the right lobe the liver. No abnormal discharge or drainage is noted. At this time I turned my attention to closure.  The pneumoperitoneum was evacuated. Trochars were removed. The fascial edges of the 2 (that's reapproximated using a 2-0 Vicryl. The local anesthetic was instilled. Skin staples were utilized to reapproximate the skin edges. The skin was washed dried moist dry towel. Sterile dressings were placed over the 2 trocar sites. The drapes removed the dressings were secured. The patient was allowed to come out of general anesthetic was transferred to PACU in stable condition. At the conclusion of procedure all instrument, sponge, needle counts are correct. Patient tolerated procedure extremely well.  Complications:  None apparent  EBL:  Minimal  Specimen:  None  Findings: In the of Jackson-Pratt tubing had a slightly beveled clean edge.

## 2012-10-31 NOTE — Anesthesia Procedure Notes (Signed)
Procedure Name: Intubation Date/Time: 10/31/2012 10:44 AM Performed by: Caren Macadam Pre-anesthesia Checklist: Patient identified, Emergency Drugs available, Suction available and Patient being monitored Patient Re-evaluated:Patient Re-evaluated prior to inductionOxygen Delivery Method: Circle System Utilized Preoxygenation: Pre-oxygenation with 100% oxygen Intubation Type: IV induction Ventilation: Mask ventilation without difficulty Laryngoscope Size: Miller and 2 Grade View: Grade I Tube type: Oral Number of attempts: 1 Airway Equipment and Method: stylet and oral airway Placement Confirmation: ETT inserted through vocal cords under direct vision,  positive ETCO2 and breath sounds checked- equal and bilateral Secured at: 22 cm Tube secured with: Tape Dental Injury: Teeth and Oropharynx as per pre-operative assessment

## 2012-10-31 NOTE — Anesthesia Preprocedure Evaluation (Addendum)
Anesthesia Evaluation  Patient identified by MRN, date of birth, ID band Patient awake    Reviewed: Allergy & Precautions, H&P , NPO status , Patient's Chart, lab work & pertinent test results  History of Anesthesia Complications Negative for: history of anesthetic complications  Airway Mallampati: III TM Distance: >3 FB Neck ROM: Full    Dental  (+) Edentulous Upper and Edentulous Lower   Pulmonary Current Smoker,  breath sounds clear to auscultation        Cardiovascular negative cardio ROS  Rhythm:Regular Rate:Normal     Neuro/Psych PSYCHIATRIC DISORDERS Anxiety Depression    GI/Hepatic PUD, GERD-  Medicated and Controlled,(+)     substance abuse  alcohol use,   Endo/Other    Renal/GU      Musculoskeletal   Abdominal   Peds  Hematology  (+) Blood dyscrasia, anemia ,   Anesthesia Other Findings   Reproductive/Obstetrics                           Anesthesia Physical Anesthesia Plan  ASA: III  Anesthesia Plan: General   Post-op Pain Management:    Induction: Intravenous, Rapid sequence and Cricoid pressure planned  Airway Management Planned: Oral ETT  Additional Equipment:   Intra-op Plan:   Post-operative Plan: Extubation in OR  Informed Consent: I have reviewed the patients History and Physical, chart, labs and discussed the procedure including the risks, benefits and alternatives for the proposed anesthesia with the patient or authorized representative who has indicated his/her understanding and acceptance.     Plan Discussed with:   Anesthesia Plan Comments:         Anesthesia Quick Evaluation  

## 2012-10-31 NOTE — Interval H&P Note (Signed)
History and Physical Interval Note:  10/31/2012 10:27 AM  Edward Trevino  has presented today for surgery, with the diagnosis of retained JP drain  The various methods of treatment have been discussed with the patient and family. After consideration of risks, benefits and other options for treatment, the patient has consented to  Procedure(s): LAPAROSCOPY DIAGNOSTIC (N/A) as a surgical intervention .  The patient's history has been reviewed, patient examined, no change in status, stable for surgery.  I have reviewed the patient's chart and labs.  Questions were answered to the patient's satisfaction.     [name]

## 2012-10-31 NOTE — Progress Notes (Signed)
UR Chart Review Completed  

## 2012-10-31 NOTE — Anesthesia Postprocedure Evaluation (Addendum)
  Anesthesia Post-op Note  Patient: Edward Trevino  Procedure(s) Performed: Procedure(s): LAPAROSCOPY DIAGNOSTIC (N/A)  Patient Location: PACU  Anesthesia Type:General  Level of Consciousness: oriented  Airway and Oxygen Therapy: Patient Spontanous Breathing  Post-op Pain: mild  Post-op Assessment: Post-op Vital signs reviewed, Patient's Cardiovascular Status Stable, Respiratory Function Stable, Patent Airway and No signs of Nausea or vomiting  Post-op Vital Signs: stable  Complications: No apparent anesthesia complications 11/01/12  Patient alert.  VSS.  No apparent anesthesia complications.

## 2012-10-31 NOTE — Progress Notes (Signed)
INITIAL NUTRITION ASSESSMENT  DOCUMENTATION CODES Per approved criteria  -Non-severe (moderate) malnutrition in the context of acute illness or injury   INTERVENTION:  Resource Breeze po TID, each supplement provides 250 kcal and 9 grams of protein.  Recommend consider trial of appetite stimulant  Add MVI daily  NUTRITION DIAGNOSIS: Malnutrition; ongoing related to poor appetite as evidenced by nausea, abdominal pain.   Goal: Pt to meet >/= 90% of their estimated nutrition needs  Monitor:  Diet advancement, po intake, labs and wt trends  Reason for Assessment: Malnutrition Screen Score =3  79 y.o. male  Admitting Dx:   ASSESSMENT: Pt has presented with continued abdominal pain, nausea, vomiting, anorexia. He is s/p laparoscopic drain removal today. He denies pain, says he is just not hungry. Will continue to follow diet advancement, tolerance and make additional recommendations accordingly.  Height: Ht Readings from Last 1 Encounters:  10/30/12 6\' 3"  (1.905 m)    Weight: Wt Readings from Last 1 Encounters:  10/30/12 175 lb (79.379 kg)    Ideal Body Weight: 196# (89kg)  % Ideal Body Weight: 89%  Wt Readings from Last 10 Encounters:  10/30/12 175 lb (79.379 kg)  10/30/12 175 lb (79.379 kg)  10/23/12 175 lb 0.7 oz (79.4 kg)  10/23/12 175 lb 0.7 oz (79.4 kg)  10/19/12 184 lb 15.5 oz (83.9 kg)  10/15/12 184 lb (83.462 kg)  10/01/12 184 lb 3.2 oz (83.553 kg)  07/24/12 180 lb (81.647 kg)  07/24/12 180 lb (81.647 kg)  01/24/11 175 lb (79.379 kg)    Usual Body Weight: 180-185#  % Usual Body Weight: 95%  BMI:  Body mass index is 21.87 kg/(m^2).normal range  Estimated Nutritional Needs: Kcal:  Protein:  Fluid:   Skin: abdominal incision (4 ports)  Diet Order: Clear Liquid  EDUCATION NEEDS: -Education needs addressed emphasized the importance of adequate nutrition intake to optimize recovery   Intake/Output Summary (Last 24 hours) at 10/31/12  1336 Last data filed at 10/31/12 1100  Gross per 24 hour  Intake    700 ml  Output      0 ml  Net    700 ml    Last BM: 10/27/12  Labs:   Recent Labs Lab 10/26/12 0538 10/27/12 0639 10/30/12 1523 10/31/12 0437  NA 139 136 140 139  K 3.5 3.4* 3.5 4.3  CL 106 100 97 100  CO2 25 29 34* 32  BUN 16 11 13 14   CREATININE 0.99 1.03 1.16 1.01  CALCIUM 8.6 8.4 9.1 8.3*  MG  --  1.9  --   --   PHOS  --  2.9  --   --   GLUCOSE 110* 112* 145* 167*    CBG (last 3)  No results found for this basename: GLUCAP,  in the last 72 hours  Scheduled Meds: . clonazePAM  0.5 mg Oral Custom   And  . clonazePAM  1 mg Oral QHS  . dextrose 5 % and 0.45% NaCl   Intravenous STAT  . enoxaparin (LOVENOX) injection  40 mg Subcutaneous Q24H  . feeding supplement  1 Container Oral TID WC  . mirtazapine  30 mg Oral QHS  . pantoprazole (PROTONIX) IV  40 mg Intravenous QHS  . QUEtiapine  100 mg Oral TID  . venlafaxine XR  75 mg Oral Daily    Continuous Infusions:   Past Medical History  Diagnosis Date  . Depression   . Insomnia   . Anxiety   . Suicide attempt   .  Erosive esophagitis 07/25/2012  . Gastric out let obstruction 07/25/2012  . HOH (hard of hearing)     Past Surgical History  Procedure Laterality Date  . Appendectomy    . Back surgery    . Ercp N/A 07/24/2012    Dr. Jena Gauss. Significant abnormalities of the bowl and proximal second portion producing partial gastric outlet stricture and with secondary gastric dilation and severe erosive reflux esophagitis. Biopsy showed benign ulceration. Normal-appearing ampulla, status post biliary sphincterotomy and ampulla balloon dilation, stone extraction and stent placement.  Marland Kitchen Sphincterotomy N/A 07/24/2012    Procedure: SPHINCTEROTOMY;  Surgeon: Corbin Ade, MD;  Location: [facility];  Service: Endoscopy;  Laterality: N/A;  . Esophageal biopsy N/A 07/24/2012    Procedure: BIOPSY;  Surgeon: Corbin Ade, MD;  Location: [facility];  Service:  Endoscopy;  Laterality: N/A;  Duodenal and Esophageal Biopsies  . Esophagogastroduodenoscopy N/A 07/24/2012    ZOX:[id number] ampulla s/p biliary sphincterotomy and ampullary  . Balloon dilation N/A 07/24/2012    Procedure: BALLOON DILATION;  Surgeon: Corbin Ade, MD;  Location: [facility];  Service: Endoscopy;  Laterality: N/A;  Balloon Stone Extraction, Drudging  . Removal of stones N/A 07/24/2012    Procedure: REMOVAL OF STONES;  Surgeon: Corbin Ade, MD;  Location: [facility];  Service: Endoscopy;  Laterality: N/A;  . Egd/ercp  10/18/2012    Dr. Jena Gauss: ;yloric channel stenosis, s/p dilation and bx, persisting CBD stones with extension of sphincterotomy and sphincterotomy balloon dilation and stone extraction. Removal of biliary stent  . Esophagogastroduodenoscopy (egd) with propofol N/A 10/18/2012    Procedure: ESOPHAGOGASTRODUODENOSCOPY (EGD) WITH PROPOFOL;  Surgeon: Corbin Ade, MD;  Location: [facility];  Service: Endoscopy;  Laterality: N/A;  . Ercp N/A 10/18/2012    Procedure: ENDOSCOPIC RETROGRADE CHOLANGIOPANCREATOGRAPHY (ERCP);  Surgeon: Corbin Ade, MD;  Location: [facility];  Service: Endoscopy;  Laterality: N/A;  . Removal of stones N/A 10/18/2012    Procedure: REMOVAL OF STONES;  Surgeon: Corbin Ade, MD;  Location: [facility];  Service: Endoscopy;  Laterality: N/A;  . Esophageal biopsy N/A 10/18/2012    Procedure: BIOPSY;  Surgeon: Corbin Ade, MD;  Location: [facility];  Service: Endoscopy;  Laterality: N/A;    Royann Shivers MS,RD,LDN,CSG Office: [phone] Pager: [phone]

## 2012-10-31 NOTE — Transfer of Care (Signed)
Immediate Anesthesia Transfer of Care Note  Patient: Edward Trevino  Procedure(s) Performed: Procedure(s): LAPAROSCOPY DIAGNOSTIC (N/A)  Patient Location: PACU  Anesthesia Type:General  Level of Consciousness: awake and alert   Airway & Oxygen Therapy: Patient Spontanous Breathing and Patient connected to face mask oxygen  Post-op Assessment: Report given to PACU RN and Post -op Vital signs reviewed and stable  Post vital signs: Reviewed and stable  Complications: No apparent anesthesia complications

## 2012-10-31 NOTE — H&P (Signed)
Edward Trevino is an 79 y.o. male.   Chief Complaint: Abdominal pain, lack of appetite, nausea vomiting and diarrhea HPI: Patient presented to [facility] emergency department with continued nausea vomiting and abdominal pain following a recent laparoscopic cholecystectomy. Patient's family is present and apparently is had continued confusion since his discharge from the hospital. His appetite has been poor. He has developed some nausea with nonbloody emesis. He has had some loose stools. He denies any fevers or chills. He complains of some upper abdominal pain. When asked about his drain patient is obviously confused. Patient cannot remember what exactly happened to his drain other than it is now gone. Patient's family states that they noted several pieces of the drain tubing located around his house including an ashtray as well as a trash can. Segments of the drain tubing were located at each of these sites. Then a longer have the tubing in her possession.  Past Medical History  Diagnosis Date  . Depression   . Insomnia   . Anxiety   . Suicide attempt   . Erosive esophagitis 07/25/2012  . Gastric out let obstruction 07/25/2012  . HOH (hard of hearing)     Past Surgical History  Procedure Laterality Date  . Appendectomy    . Back surgery    . Ercp N/A 07/24/2012    Dr. Jena Gauss. Significant abnormalities of the bowl and proximal second portion producing partial gastric outlet stricture and with secondary gastric dilation and severe erosive reflux esophagitis. Biopsy showed benign ulceration. Normal-appearing ampulla, status post biliary sphincterotomy and ampulla balloon dilation, stone extraction and stent placement.  Marland Kitchen Sphincterotomy N/A 07/24/2012    Procedure: SPHINCTEROTOMY;  Surgeon: Corbin Ade, MD;  Location: [facility];  Service: Endoscopy;  Laterality: N/A;  . Esophageal biopsy N/A 07/24/2012    Procedure: BIOPSY;  Surgeon: Corbin Ade, MD;  Location: [facility];  Service: Endoscopy;   Laterality: N/A;  Duodenal and Esophageal Biopsies  . Esophagogastroduodenoscopy N/A 07/24/2012    [id number] ampulla s/p biliary sphincterotomy and ampullary  . Balloon dilation N/A 07/24/2012    Procedure: BALLOON DILATION;  Surgeon: Corbin Ade, MD;  Location: [facility];  Service: Endoscopy;  Laterality: N/A;  Balloon Stone Extraction, Drudging  . Removal of stones N/A 07/24/2012    Procedure: REMOVAL OF STONES;  Surgeon: Corbin Ade, MD;  Location: [facility];  Service: Endoscopy;  Laterality: N/A;  . Egd/ercp  10/18/2012    Dr. Jena Gauss: ;yloric channel stenosis, s/p dilation and bx, persisting CBD stones with extension of sphincterotomy and sphincterotomy balloon dilation and stone extraction. Removal of biliary stent  . Esophagogastroduodenoscopy (egd) with propofol N/A 10/18/2012    Procedure: ESOPHAGOGASTRODUODENOSCOPY (EGD) WITH PROPOFOL;  Surgeon: Corbin Ade, MD;  Location: [facility];  Service: Endoscopy;  Laterality: N/A;  . Ercp N/A 10/18/2012    Procedure: ENDOSCOPIC RETROGRADE CHOLANGIOPANCREATOGRAPHY (ERCP);  Surgeon: Corbin Ade, MD;  Location: [facility];  Service: Endoscopy;  Laterality: N/A;  . Removal of stones N/A 10/18/2012    Procedure: REMOVAL OF STONES;  Surgeon: Corbin Ade, MD;  Location: [facility];  Service: Endoscopy;  Laterality: N/A;  . Esophageal biopsy N/A 10/18/2012    Procedure: BIOPSY;  Surgeon: Corbin Ade, MD;  Location: [facility];  Service: Endoscopy;  Laterality: N/A;    Family History  Problem Relation Age of Onset  . Colon cancer Neg Hx    Social History:  reports that he has been smoking Cigars.  He does  not have any smokeless tobacco history on file. He reports that he does not drink alcohol or use illicit drugs.  Allergies: No Known Allergies  Medications Prior to Admission  Medication Sig Dispense Refill  . clonazePAM (KLONOPIN) 0.5 MG tablet Take 1 tablet (0.5 mg total) by mouth 3 (three) times daily. Takes one tablet twice daily and 2 tablets at  bedtime.  90 tablet  0  . HYDROcodone-acetaminophen (NORCO) 5-325 MG per tablet Take 1 tablet by mouth every 6 (six) hours as needed for pain.  40 tablet  0  . mirtazapine (REMERON) 30 MG tablet Take 1 tablet (30 mg total) by mouth at bedtime.  30 tablet  0  . Multiple Vitamin (MULTIVITAMIN WITH MINERALS) TABS Take 1 tablet by mouth daily.      . QUEtiapine (SEROQUEL) 100 MG tablet Take 1 tablet (100 mg total) by mouth 3 (three) times daily.  90 tablet  0  . venlafaxine XR (EFFEXOR-XR) 75 MG 24 hr capsule Take 1 capsule (75 mg total) by mouth daily.  30 capsule  0  . vitamin C (ASCORBIC ACID) 500 MG tablet Take 500 mg by mouth daily.        Results for orders placed during the hospital encounter of 10/30/12 (from the past 48 hour(s))  CBC WITH DIFFERENTIAL     Status: Abnormal   Collection Time    10/30/12  3:23 PM      Result Value Range   WBC 13.9 (*) 4.0 - 10.5 K/uL   RBC 3.57 (*) 4.22 - 5.81 MIL/uL   Hemoglobin 11.2 (*) 13.0 - 17.0 g/dL   HCT 16.1 (*) 09.6 - 04.5 %   MCV 94.1  78.0 - 100.0 fL   MCH 31.4  26.0 - 34.0 pg   MCHC 33.3  30.0 - 36.0 g/dL   RDW 40.9  81.1 - 91.4 %   Platelets 403 (*) 150 - 400 K/uL   Neutrophils Relative % 85 (*) 43 - 77 %   Neutro Abs 11.8 (*) 1.7 - 7.7 K/uL   Lymphocytes Relative 8 (*) 12 - 46 %   Lymphs Abs 1.1  0.7 - 4.0 K/uL   Monocytes Relative 6  3 - 12 %   Monocytes Absolute 0.9  0.1 - 1.0 K/uL   Eosinophils Relative 0  0 - 5 %   Eosinophils Absolute 0.1  0.0 - 0.7 K/uL   Basophils Relative 0  0 - 1 %   Basophils Absolute 0.0  0.0 - 0.1 K/uL  COMPREHENSIVE METABOLIC PANEL     Status: Abnormal   Collection Time    10/30/12  3:23 PM      Result Value Range   Sodium 140  135 - 145 mEq/L   Potassium 3.5  3.5 - 5.1 mEq/L   Chloride 97  96 - 112 mEq/L   CO2 34 (*) 19 - 32 mEq/L   Glucose, Bld 145 (*) 70 - 99 mg/dL   BUN 13  6 - 23 mg/dL   Creatinine, Ser 7.82  0.50 - 1.35 mg/dL   Calcium 9.1  8.4 - 95.6 mg/dL   Total Protein 7.2  6.0 - 8.3  g/dL   Albumin 3.1 (*) 3.5 - 5.2 g/dL   AST 60 (*) 0 - 37 U/L   ALT 61 (*) 0 - 53 U/L   Alkaline Phosphatase 302 (*) 39 - 117 U/L   Total Bilirubin 0.6  0.3 - 1.2 mg/dL   GFR calc non Af Denyse Dago  58 (*) >90 mL/min   GFR calc Af Amer 68 (*) >90 mL/min   Comment:            The eGFR has been calculated     using the CKD EPI equation.     This calculation has not been     validated in all clinical     situations.     eGFR's persistently     <90 mL/min signify     possible Chronic Kidney Disease.  LIPASE, BLOOD     Status: None   Collection Time    10/30/12  3:23 PM      Result Value Range   Lipase 27  11 - 59 U/L  COMPREHENSIVE METABOLIC PANEL     Status: Abnormal   Collection Time    10/31/12  4:37 AM      Result Value Range   Sodium 139  135 - 145 mEq/L   Potassium 4.3  3.5 - 5.1 mEq/L   Comment: DELTA CHECK NOTED   Chloride 100  96 - 112 mEq/L   CO2 32  19 - 32 mEq/L   Glucose, Bld 167 (*) 70 - 99 mg/dL   BUN 14  6 - 23 mg/dL   Creatinine, Ser 1.61  0.50 - 1.35 mg/dL   Calcium 8.3 (*) 8.4 - 10.5 mg/dL   Total Protein 6.2  6.0 - 8.3 g/dL   Albumin 2.6 (*) 3.5 - 5.2 g/dL   AST 48 (*) 0 - 37 U/L   ALT 52  0 - 53 U/L   Alkaline Phosphatase 254 (*) 39 - 117 U/L   Total Bilirubin 0.4  0.3 - 1.2 mg/dL   GFR calc non Af Amer 69 (*) >90 mL/min   GFR calc Af Amer 80 (*) >90 mL/min   Comment:            The eGFR has been calculated     using the CKD EPI equation.     This calculation has not been     validated in all clinical     situations.     eGFR's persistently     <90 mL/min signify     possible Chronic Kidney Disease.  CBC     Status: Abnormal   Collection Time    10/31/12  4:37 AM      Result Value Range   WBC 16.5 (*) 4.0 - 10.5 K/uL   RBC 3.25 (*) 4.22 - 5.81 MIL/uL   Hemoglobin 10.3 (*) 13.0 - 17.0 g/dL   HCT 09.6 (*) 04.5 - 40.9 %   MCV 93.8  78.0 - 100.0 fL   MCH 31.7  26.0 - 34.0 pg   MCHC 33.8  30.0 - 36.0 g/dL   RDW 81.1  91.4 - 78.2 %   Platelets 396   150 - 400 K/uL  SURGICAL PCR SCREEN     Status: None   Collection Time    10/31/12  7:57 AM      Result Value Range   MRSA, PCR NEGATIVE  NEGATIVE   Staphylococcus aureus NEGATIVE  NEGATIVE   Comment:            The Xpert SA Assay (FDA     approved for NASAL specimens     in patients over 18 years of age),     is one component of     a comprehensive surveillance     program.  Test performance has     been validated  by [facility] for patients greater     than or equal to 78 year old.     It is not intended     to diagnose infection nor to     guide or monitor treatment.   Ct Abdomen Pelvis W Contrast  10/30/2012   *RADIOLOGY REPORT*  Clinical Data: Abdominal pain.  Recent cholecystectomy.  Evaluate for retained surgical drain.  CT ABDOMEN AND PELVIS WITH CONTRAST  Technique:  Multidetector CT imaging of the abdomen and pelvis was performed following the standard protocol during bolus administration of intravenous contrast.  Contrast: 50mL OMNIPAQUE IOHEXOL 300 MG/ML  SOLN, OMNIPAQUE IOHEXOL 300 MG/ML  SOLN  Comparison: 06/06/2014and abdominal image 10/30/2012  Findings: Calcified granulomas at the lung bases. No evidence for pneumoperitoneum.  There is a surgical drain in the right upper quadrant along the inferior aspect of the liver.  The drain does not extend to the skin.  The end of the drain is just caudal to the skin site and approximately 3 cm below the skin on image 33.  The drain appears to be intra-abdominal.  There is a small amount of fluid around the drain and along the right lateral abdomen.  There is low density material in the gallbladder fossa which may be postsurgical material.  There is mild intrahepatic biliary dilatation. There is stranding and a small focus of air within the porta hepatis which may be related to the drain and recent surgery.  Again noted is diffuse wall thickening of the distal esophagus consistent with esophagitis.  There is also persistent  dilatation of the stomach.  The descending duodenum is distended with air and fluid.  There are inflammatory changes in the right upper quadrant around the surgical drain and hepatic flexure.  No gross abnormality to the kidneys, adrenal glands, spleen or pancreas.  Atherosclerotic disease of the abdominal aorta without aneurysmal dilatation.  Prostate is prominent, measuring 5.4 cm in the transverse dimension.  The urinary bladder is moderately distended and similar to the previous examination.  No significant free fluid or pelvis.  No acute bony abnormality. Bilateral small inguinal hernias containing fat.  IMPRESSION: There is a retained surgical drain within the right upper quadrant of the abdomen.  Small amount of fluid around the small bore tube. The retained drain is within the intra-abdominal cavity.  Chronic wall thickening and inflammation of the distal esophagus.  Persistent dilatation of the stomach.  There is also dilatation of the descending duodenum.   Original Report Authenticated By: Richarda Overlie, M.D.   Dg Abd Acute W/chest  10/30/2012   *RADIOLOGY REPORT*  Clinical Data: Abdominal pain, nausea, constipation  ACUTE ABDOMEN SERIES (ABDOMEN 2 VIEW & CHEST 1 VIEW)  Comparison: Chest radiograph 10/23/2012  Findings: Normal mediastinum and heart silhouette.  There is bilateral apical scarring which is similar to prior.  There is retained oral contrast within the colonic stool.  No dilated loops of large or small bowel.  There are multiple surgical clips in the right upper quadrant and skin staples along the mid upper abdomen.  There is surgical drain in the right upper quadrant .  There is gas within the stomach on the upright exam.  No clear evidence of intraperitoneal free air.  IMPRESSION:  1.  No acute cardiopulmonary findings.  2.  No evidence of bowel obstruction or free air. 3.  Retained contrast within the stool of the colon.  4.  Surgical drain in the right upper quadrant.  Original Report  Authenticated By: Genevive Bi, M.D.    Review of Systems  Constitutional: Positive for malaise/fatigue. Negative for fever and chills.  HENT: Negative.   Eyes: Negative.   Respiratory: Negative.   Cardiovascular: Negative.   Gastrointestinal: Positive for nausea, vomiting, abdominal pain and diarrhea. Negative for constipation, blood in stool and melena.  Genitourinary: Negative.   Musculoskeletal: Negative.   Skin: Negative.   Neurological: Positive for weakness.  Endo/Heme/Allergies: Negative.   Psychiatric/Behavioral: Positive for memory loss.    Blood pressure 115/67, pulse 97, temperature 99 F (37.2 C), temperature source Oral, resp. rate 18, height 6\' 3"  (1.905 m), weight 79.379 kg (175 lb), SpO2 97.00%. Physical Exam  Constitutional: He appears well-developed and well-nourished. No distress.  HENT:  Head: Normocephalic and atraumatic.  Eyes: Conjunctivae and EOM are normal. Pupils are equal, round, and reactive to light.  Neck: Normal range of motion. Neck supple. No tracheal deviation present. No thyromegaly present.  Cardiovascular: Normal rate, regular rhythm and normal heart sounds.   Respiratory: Effort normal and breath sounds normal.  GI: Soft. Bowel sounds are normal. He exhibits no distension and no mass. There is tenderness (mild right upper quadrant tenderness). There is no rebound and no guarding.  Lymphadenopathy:    He has no cervical adenopathy.  Neurological:  Awake, moderately confused  Skin: Skin is warm and dry.     Assessment/Plan Nausea vomiting abdominal pain and postoperative confusion. Retained Jackson-Pratt drain. At this time I cannot correlate patient's nausea vomiting and abdominal pain with retrained drained or evidence of infection however I do feel his symptoms are likely postoperative in nature. Regarding the drain and do feel is likely that this was cut as it would be unlikely to a defect in the tube resulting in several breakpoints.  Regardless I did discuss with the family the evidence that the Jackson-Pratt drain still has a retained portion which must be removed. Patient will be admitted and continued on IV fluid hydration. Continued on IV antibiotics. We will proceed to the operating room to retrieve the drain laparoscopically. Risks benefits alternatives have been discussed. Additionally we'll continue monitor the patient's mental status. Patient may require discharge to skilled nursing facility for initial rehabilitation pending postoperative course.  Erica Richwine C 10/31/2012, 10:19 AM

## 2012-11-01 MED ORDER — PANTOPRAZOLE SODIUM 40 MG PO TBEC
40.0000 mg | DELAYED_RELEASE_TABLET | Freq: Every day | ORAL | Status: DC
  Administered 2012-11-01 – 2012-11-04 (×4): 40 mg via ORAL
  Filled 2012-11-01 (×4): qty 1

## 2012-11-01 NOTE — Addendum Note (Signed)
Addendum created 11/01/12 0851 by Marolyn Hammock, CRNA   Modules edited: Anesthesia Events

## 2012-11-01 NOTE — Clinical Documentation Improvement (Signed)
MALNUTRITION DOCUMENTATION CLARIFICATION  THIS DOCUMENT IS NOT A PERMANENT PART OF THE MEDICAL RECORD  TO RESPOND TO THE THIS QUERY, FOLLOW THE INSTRUCTIONS BELOW:  1. If needed, update documentation for the patient's encounter via the notes activity.  2. Access this query again and click edit on the In [employer].  3. After updating, or not, click F2 to complete all highlighted (required) fields concerning your review. Select "additional documentation in the medical record" OR "no additional documentation provided".  4. Click Sign note button.  5. The deficiency will fall out of your In Basket *Please let us know if you are not able to complete this workflow by phone or e-mail (listed below).  Please update your documentation within the medical record to reflect your response to this query.                                                                                        11/01/12   Dear Dr. Leticia Penna / Associates,  In a better effort to capture your patient's severity of illness, reflect appropriate length of stay and utilization of resources, a review of the patient medical record has revealed the following indicators.   Based on your clinical judgment, please clarify and document in a progress note and/or discharge summary the clinical condition associated with the following supporting information: In responding to this query please exercise your independent judgment.  The fact that a query is asked, does not imply that any particular answer is desired or expected.  Please clarify nutritional status. Thank you.  Possible Clinical Conditions?  Mild Malnutrition  Moderate Malnutrition Severe Malnutrition   Protein Calorie Malnutrition Severe Protein Calorie Malnutrition Other Condition________________ Cannot clinically determine   Supporting Information: Risk Factors: Confusion Loss of appetite S/P ERCP/Sphincterotomy & currently drain removal continued abdominal pain,  nausea, vomiting, anorexia History of erosive esophagitis and gastric outlet obstruction Per approved criteria   -Non-severe (moderate) malnutrition in the context of acute illness or injury      Signs & Symptoms: Ht  6'3"     Wt  175 lbs  BMI:  21.87   Treatment  Regular diet Ensure & Resource Breeze supplements Multivitamin Zofran Protonix Monitor I&Os  Nutrition Consult NUTRITION DIAGNOSIS:  Malnutrition; ongoing related to poor appetite as evidenced by nausea, abdominal pain.  Goal: Pt to meet >/= 90% of their estimated nutrition needs  Monitor: Diet advancement, po intake, labs and wt trends INTERVENTION:  Resource Breeze po TID, each supplement provides 250 kcal and 9 grams of protein. Recommend consider trial of appetite stimulant Add MVI daily   You may use possible, probable, or suspect with inpatient documentation. possible, probable, suspected diagnoses MUST be documented at the time of discharge  Reviewed: additional documentation in the medical record  Thank You,  Debora T Williams RN, MSN Clinical Documentation Specialist: Office# [phone] [facility] Health Information Management [place]   Patient's chart updated.

## 2012-11-01 NOTE — Progress Notes (Signed)
Pt has had poor po intake during shift. Pt states "Im not hungry" and has also has refused to ambulate during shift.

## 2012-11-01 NOTE — Care Management Note (Unsigned)
    Page 1 of 2   11/02/2012     3:30:47 PM   CARE MANAGEMENT NOTE 11/02/2012  Patient:  Edward Trevino, Edward Trevino   Account Number:  [id number]  Date Initiated:  11/01/2012  Documentation initiated by:  Sharrie Rothman  Subjective/Objective Assessment:   Pt admitted from home with dehydration and tachycardia. Pt is also s/p exp lap due to pt pulling out JP drain and leaving part of the drain behind. Pt lives alone and has a son that chekcs on pt frequently. Pt also has a Scientific laboratory technician who     Action/Plan:   helps pt with transportation. Pt is independent with ADL's. Will continue to monitor for any HH needs.   Anticipated DC Date:  11/03/2012   Anticipated DC Plan:  HOME W HOME HEALTH SERVICES      DC Planning Services  CM consult      [facility] Choice  HOME HEALTH   Choice offered to / List presented to:  C-4 Adult Children        HH arranged  HH-1 RN  HH-2 PT      [facility] agency  [facility]   Status of service:  In process, will continue to follow Medicare Important Message given?   (If response is "NO", the following Medicare IM given date fields will be blank) Date Medicare IM given:   Date Additional Medicare IM given:    Discharge Disposition:    Per UR Regulation:    If discussed at Long Length of Stay Meetings, dates discussed:    Comments:  11/02/12 1520 Arlyss Queen, RN BSN CM PT has recommending SNF at discharge. CSW spoke with pts daughter in law, [place], and she stated that she and her husband (pts son) live with the pt and pt will return home at discharge. CM spoke with Carlene and they are agreeable to [facility] with [facility]. Alroy Bailiff of [facility] is aware and weekend staff will fax [facility] orders and call at discharge. PT and RN has been set up. No DME needs noted at this time.  11/01/12 0945 Arlyss Queen, RN BSN CM

## 2012-11-02 MED ORDER — POLYETHYLENE GLYCOL 3350 17 G PO PACK
17.0000 g | PACK | Freq: Every day | ORAL | Status: DC
  Administered 2012-11-02 – 2012-11-05 (×4): 17 g via ORAL
  Filled 2012-11-02 (×4): qty 1

## 2012-11-02 MED ORDER — ALUM & MAG HYDROXIDE-SIMETH 200-200-20 MG/5ML PO SUSP
30.0000 mL | Freq: Every day | ORAL | Status: DC
  Administered 2012-11-02 – 2012-11-05 (×4): 30 mL via ORAL
  Filled 2012-11-02 (×4): qty 30

## 2012-11-02 NOTE — Progress Notes (Signed)
2 Days Post-Op  Subjective: Pain mostly controlled.  Not hungry  Objective: Vital signs in last 24 hours: Temp:  [98.1 F (36.7 C)-99.9 F (37.7 C)] 99.9 F (37.7 C) (06/19 2300) Pulse Rate:  [78-96] 90 (06/19 2300) Resp:  [16-20] 16 (06/19 2300) BP: (100-114)/(47-66) 107/56 mmHg (06/19 2300) SpO2:  [96 %-100 %] 100 % (06/19 2300) Last BM Date: 10/27/12  Intake/Output from previous day: 06/19 0701 - 06/20 0700 In: 300 [P.O.:300] Out: 650 [Urine:650] Intake/Output this shift: Total I/O In: -  Out: 300 [Urine:300]  General appearance: alert and no distress GI: +BS, soft, anticipated abdominal tenderness.  Incision clean dry and intact.  Lab Results:   Recent Labs  10/30/12 1523 10/31/12 0437  WBC 13.9* 16.5*  HGB 11.2* 10.3*  HCT 33.6* 30.5*  PLT 403* 396   BMET  Recent Labs  10/30/12 1523 10/31/12 0437  NA 140 139  K 3.5 4.3  CL 97 100  CO2 34* 32  GLUCOSE 145* 167*  BUN 13 14  CREATININE 1.16 1.01  CALCIUM 9.1 8.3*   PT/INR No results found for this basename: LABPROT, INR,  in the last 72 hours ABG No results found for this basename: PHART, PCO2, PO2, HCO3,  in the last 72 hours  Studies/Results: No results found.  Anti-infectives: Anti-infectives   Start     Dose/Rate Route Frequency Ordered Stop   10/31/12 0745  cefOXitin (MEFOXIN) 2 g in dextrose 5 % 50 mL IVPB     2 g 100 mL/hr over 30 Minutes Intravenous On call to O.R. 10/31/12 0737 10/31/12 1033      Assessment/Plan: s/p Procedure(s): LAPAROSCOPY DIAGNOSTIC (N/A) Increase activity.  AMbulate in hall.  Diet as tolerated.    LOS: 3 days    Monalisa Bayless C 11/02/2012

## 2012-11-02 NOTE — Progress Notes (Signed)
Patient remains with no appetite this evening.  Did drink 1/2 of one ensure.  Patient was oriented x 3 this morning, but now is somewhat confused.  Patient had forgotten he was in the hospital and asked when his children were coming to pick him up.  Easily re-oriented, though.  Patient was also  irritable this morning and afternoon, but now is very pleasant. Still awaiting telepsych.  Called back, was told that the MD had many consults to do at this time, will call when ready.  Patient not ready to go back to bed at this time.  In chair with alarm, call bell within reach.

## 2012-11-02 NOTE — Progress Notes (Signed)
2 Days Post-Op  Subjective: Patient states that he has no appetite and refuses to get out of bed.  Objective: Vital signs in last 24 hours: Temp:  [97.8 F (36.6 C)-99.9 F (37.7 C)] 97.8 F (36.6 C) (06/20 0643) Pulse Rate:  [87-94] 87 (06/20 0643) Resp:  [16-20] 17 (06/20 0643) BP: (100-130)/(52-66) 130/66 mmHg (06/20 0643) SpO2:  [98 %-100 %] 98 % (06/20 0643) Last BM Date: 10/29/12 (per patient)  Intake/Output from previous day: 06/19 0701 - 06/20 0700 In: 300 [P.O.:300] Out: 950 [Urine:950] Intake/Output this shift:    General appearance: appears stated age and no distress Resp: clear to auscultation bilaterally Cardio: regular rate and rhythm, S1, S2 normal, no murmur, click, rub or gallop GI: Soft. Occasional bowel sounds appreciated. Dressings dry and intact.  Lab Results:   Recent Labs  10/30/12 1523 10/31/12 0437  WBC 13.9* 16.5*  HGB 11.2* 10.3*  HCT 33.6* 30.5*  PLT 403* 396   BMET  Recent Labs  10/30/12 1523 10/31/12 0437  NA 140 139  K 3.5 4.3  CL 97 100  CO2 34* 32  GLUCOSE 145* 167*  BUN 13 14  CREATININE 1.16 1.01  CALCIUM 9.1 8.3*   PT/INR No results found for this basename: LABPROT, INR,  in the last 72 hours  Studies/Results: No results found.  Anti-infectives: Anti-infectives   Start     Dose/Rate Route Frequency Ordered Stop   10/31/12 0745  cefOXitin (MEFOXIN) 2 g in dextrose 5 % 50 mL IVPB     2 g 100 mL/hr over 30 Minutes Intravenous On call to O.R. 10/31/12 0737 10/31/12 1033      Assessment/Plan: s/p Procedure(s): LAPAROSCOPY DIAGNOSTIC Impression: Stable.  Patient refuses to eat. He states he has no appetite. 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 He is had multiple admissions this year for various problems. I told him he needed to ambulate and will get physical therapy to evaluate him. 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 Leukocytosis not unexpected given his recent surgery.  LOS: 3 days    Davelyn Gwinn A 11/02/2012

## 2012-11-02 NOTE — Evaluation (Signed)
Physical Therapy Evaluation Patient Details Name: Edward Trevino MRN: [id number] DOB: 1932/01/05 Today's Date: 11/02/2012 Time: 6578-4696 PT Time Calculation (min): 32 min  PT Assessment / Plan / Recommendation Clinical Impression  Pt was seen for evaluation.  He is alert but confused (which he recognizes) and has poor memory.  He is reasonably cooperative if approached very slowly and with no pressure.  By report, he lives alone and had been independent with all ADLs.  Currently, he is very deconditioned and has decreased standing balance.  He now needs a walker and was only able to walk a max of 20'.  He lost balance backwards each time he walked.  I am strongly encouraging SNF at d/c...he is willing to listen to MSW about this.    PT Assessment  Patient needs continued PT services    Follow Up Recommendations  SNF    Does the patient have the potential to tolerate intense rehabilitation      Barriers to Discharge Decreased caregiver support      Equipment Recommendations  Rolling walker with 5" wheels    Recommendations for Other Services     Frequency Min 3X/week    Precautions / Restrictions Precautions Precautions: Fall Restrictions Weight Bearing Restrictions: No Other Position/Activity Restrictions: pt has small abdonimal incisions, staples intact   Pertinent Vitals/Pain       Mobility  Bed Mobility Bed Mobility: Supine to Sit Supine to Sit: 4: Min guard;HOB elevated Transfers Transfers: Sit to Stand;Stand to Sit Sit to Stand: 4: Min guard;With upper extremity assist;From bed Stand to Sit: 4: Min guard;With upper extremity assist;To chair/3-in-1;To bed Ambulation/Gait Ambulation/Gait Assistance: 4: Min assist Ambulation Distance (Feet): 20 Feet (then 15'x1) Assistive device: Rolling walker Ambulation/Gait Assistance Details: pt dost balance x2 while ambulating...falls backward Gait Pattern: Shuffle;Trunk flexed Gait velocity: slow and labored Stairs:  No Wheelchair Mobility Wheelchair Mobility: No    Exercises     PT Diagnosis: Difficulty walking;Abnormality of gait;Generalized weakness  PT Problem List: Decreased strength;Decreased activity tolerance;Decreased balance;Decreased mobility;Decreased cognition;Decreased knowledge of use of DME;Decreased safety awareness PT Treatment Interventions: DME instruction;Gait training;Functional mobility training;Therapeutic exercise;Patient/family education   PT Goals Acute Rehab PT Goals PT Goal Formulation: With patient Time For Goal Achievement: 11/09/12 Potential to Achieve Goals: Good Pt will Ambulate: 51 - 150 feet;with supervision;with least restrictive assistive device PT Goal: Ambulate - Progress: Goal set today  Visit Information  Last PT Received On: 11/02/12    Subjective Data  Subjective: I feel a little confused Patient Stated Goal: return home   Prior Functioning  Home Living Lives With: Alone Available Help at Discharge: Other (Comment) (unknown) Type of Home: House Home Access: Level entry Home Layout: One level Bathroom Toilet: Standard Home Adaptive Equipment: Straight cane Additional Comments: pt states that he has a cane but doesn't use it Prior Function Level of Independence: Independent Able to Take Stairs?:  (unknown) Driving: No Vocation: Retired Musician: No difficulties    Copywriter, advertising Arousal/Alertness: Awake/alert Behavior During Therapy: WFL for tasks assessed/performed Overall Cognitive Status: No family/caregiver present to determine baseline cognitive functioning    Extremity/Trunk Assessment Right Lower Extremity Assessment RLE ROM/Strength/Tone: Deficits RLE ROM/Strength/Tone Deficits: strength 3/5 RLE Sensation: WFL - Light Touch RLE Coordination: WFL - gross motor Left Lower Extremity Assessment LLE ROM/Strength/Tone: Deficits LLE ROM/Strength/Tone Deficits: strength 3/5 LLE Sensation: WFL - Light  Touch LLE Coordination: WFL - gross motor Trunk Assessment Trunk Assessment: Normal   Balance Balance Balance Assessed: Yes Static Sitting Balance Static  Sitting - Balance Support: No upper extremity supported;Feet supported Static Sitting - Level of Assistance: 7: Independent Static Standing Balance Static Standing - Balance Support: No upper extremity supported Static Standing - Level of Assistance: 5: Stand by assistance  End of Session PT - End of Session Equipment Utilized During Treatment: Gait belt Activity Tolerance: Patient limited by fatigue Patient left: in chair;with call bell/phone within reach;with chair alarm set  GP     Myrlene Broker L 11/02/2012, 11:47 AM

## 2012-11-02 NOTE — Clinical Social Work Psychosocial (Signed)
Clinical Social Work Department BRIEF PSYCHOSOCIAL ASSESSMENT 11/02/2012  Patient:  Edward Trevino, Edward Trevino     Account Number:  [id number]     Admit date:  10/30/2012  Clinical Social Worker:  Nancie Neas  Date/Time:  11/02/2012 03:15 PM  Referred by:  Care Management  Date Referred:  11/02/2012 Referred for  SNF Placement   Other Referral:   Interview type:  Patient Other interview type:   and daughter-in-law Carlene    PSYCHOSOCIAL DATA Living Status:  ALONE Admitted from facility:   Level of care:   Primary support name:  Carlene Primary support relationship to patient:  FAMILY Degree of support available:   supportive per pt    CURRENT CONCERNS Current Concerns  Post-Acute Placement  Behavioral Health Issues   Other Concerns:    SOCIAL WORK ASSESSMENT / PLAN CSW met with pt at bedside following referral from PT for SNF. Pt alert and oriented, but has very flat affect. He was irritable with questions asked by CSW, but then opened up a little more. Pt lives alone and reports he does fine on his own. He was mostly independent prior to admission, but his son and daughter-in-law assisted him with driving, paying bills, and getting groceries. Son and daughter-in-law live behind pt and appear to be very involved and supportive. Pt would use a cane occasionally. He reports he is feeling weak and tired. CSW asked pt about depression as documented in history and pt's current complaints of no appetite and sleep issues. He said he has had diagnosis of depression for a long time. He states he has "good and bad days." Pt has also had history of suicide attempts. However, pt would not elaborate on this and wanted CSW to talk to his daughter-in-law Carlene. CSW also mentioned SNF and pt deferred this to her as well. Per Carlene, pt's medical problems started in March with his gallbladder. She quit her job as a Lawyer at [employer] in order to take care of pt's needs. CSW asked her about  depression, and she does not currently feel pt is depressed but feels it is related to pain. Pt has been to several different inpatient facilities due to suicide attempts in the past 3-4 years. He was being followed by [facility] in [place], but they requested that this stop as pt was getting very frustrated with them. He is not being seen by therapist or psychiatrist currently and is prescribed medications by his PCP. Pt is currently on Klonopin, Remeron, Seroquel, and Effexor. CSW spoke with MD prior to discussion with Carlene and requested telepsych due to symptoms of depression. Order given. However, Carlene is requesting that pt's medications not be changed as he has been on many different combinations and this has worked the best. Charity fundraiser aware. CSW discussed SNF recommendation and pt's son was with Carlene as well. They both report pt will not be going to SNF at d/c as they have the ability to manage pt at home. The have employees who can stay with pt in addition to [facility] and someone will be with pt around the clock. They are open to home health. CM aware.   Assessment/plan status:  Referral to [facility] Other assessment/ plan:   Information/referral to community resources:   Telepsych  CM for home health    PATIENT'S/FAMILY'S RESPONSE TO PLAN OF CARE: Pt appears to be depressed and has no appetite and is sleeping most of the day per RN report. Per MD, pt will be here through weekend. CSW  will follow up with telepsych recommendations on Monday. At this point, family is refusing SNF and pt wants them to make decision. Will determine appropriate mental health follow up and assist family in arranging this.       [name], [place] [phone]

## 2012-11-02 NOTE — Progress Notes (Signed)
Consult to telepsych called and info faxed.  Explained procedure to patient.  Waiting for notification from MD to begin consultation

## 2012-11-03 LAB — CBC
HCT: 29.1 % — ABNORMAL LOW (ref 39.0–52.0)
Hemoglobin: 9.7 g/dL — ABNORMAL LOW (ref 13.0–17.0)
MCH: 31.3 pg (ref 26.0–34.0)
MCHC: 33.3 g/dL (ref 30.0–36.0)
MCV: 93.9 fL (ref 78.0–100.0)
Platelets: 494 10*3/uL — ABNORMAL HIGH (ref 150–400)
RDW: 14.2 % (ref 11.5–15.5)

## 2012-11-03 LAB — BASIC METABOLIC PANEL
BUN: 15 mg/dL (ref 6–23)
Calcium: 8.5 mg/dL (ref 8.4–10.5)
Creatinine, Ser: 0.99 mg/dL (ref 0.50–1.35)
GFR calc Af Amer: 89 mL/min — ABNORMAL LOW (ref >90)
GFR calc non Af Amer: 76 mL/min — ABNORMAL LOW (ref >90)
Glucose, Bld: 109 mg/dL — ABNORMAL HIGH (ref 70–99)
Potassium: 3.6 mEq/L (ref 3.5–5.1)

## 2012-11-03 NOTE — Progress Notes (Signed)
3 Days Post-Op  Subjective: No appetite. Pain mostly controlled. Patient states he is really upset by his discussion with his son this morning. Patient is adamant he does not want to go to a skilled nursing facility. Patient states she feels depressed  Objective: Vital signs in last 24 hours: Temp:  [97.6 F (36.4 C)-98.6 F (37 C)] 97.6 F (36.4 C) (06/21 1426) Pulse Rate:  [87-107] 107 (06/21 1426) Resp:  [18] 18 (06/21 1426) BP: (92-122)/(61-67) 92/67 mmHg (06/21 1426) SpO2:  [97 %-99 %] 99 % (06/21 1426) Last BM Date: 10/29/12  Intake/Output from previous day: 06/20 0701 - 06/21 0700 In: 580 [P.O.:580] Out: 450 [Urine:450] Intake/Output this shift:    General appearance: alert, distracted and no distress GI: Positive bowel sounds, soft, expected postoperative tenderness. Incisions are clean dry and intact. No peritoneal signs  Lab Results:   Recent Labs  11/03/12 0539  WBC 10.2  HGB 9.7*  HCT 29.1*  PLT 494*   BMET  Recent Labs  11/03/12 0539  NA 141  K 3.6  CL 104  CO2 32  GLUCOSE 109*  BUN 15  CREATININE 0.99  CALCIUM 8.5   PT/INR No results found for this basename: LABPROT, INR,  in the last 72 hours ABG No results found for this basename: PHART, PCO2, PO2, HCO3,  in the last 72 hours  Studies/Results: No results found.  Anti-infectives: Anti-infectives   Start     Dose/Rate Route Frequency Ordered Stop   10/31/12 0745  cefOXitin (MEFOXIN) 2 g in dextrose 5 % 50 mL IVPB     2 g 100 mL/hr over 30 Minutes Intravenous On call to O.R. 10/31/12 0737 10/31/12 1033      Assessment/Plan: s/p Procedure(s): LAPAROSCOPY DIAGNOSTIC (N/A) Patient still has extremely poor oral intake. I've encouraged the patient to increase the amount of supplementation it is not eating his regular diet. Continue increasing activity. I do feel patient will need some degree of rehabilitation whether a skilled nursing facility or with home assistance. I have attempted to  reassure patient. Tele-psych recommendations from yesterday do not include any increase in his current medications. Continue current medications  possible discharge tomorrow if home health is set up  LOS: 4 days    Damarco Keysor C 11/03/2012

## 2012-11-03 NOTE — Progress Notes (Signed)
Patient remains with poor appetite.  Did drink an ensure at dinner time, declines any offers of food.  Refused to ambulate in halls, although strongly encouraged to.

## 2012-11-03 NOTE — Plan of Care (Signed)
Problem: Phase II Progression Outcomes Goal: Progress activity as tolerated unless otherwise ordered Outcome: Completed/Met Date Met:  11/03/12 Pt ambulates in hall and room with assist    Goal: Progressing with IS, TCDB Outcome: Not Progressing Pt refused IS this morning

## 2012-11-03 NOTE — Plan of Care (Signed)
Problem: Phase II Progression Outcomes Goal: Return of bowel function (flatus, BM) IF ABDOMINAL SURGERY:  Outcome: Completed/Met Date Met:  11/03/12 Patient had small soft BM today

## 2012-11-04 NOTE — Progress Notes (Signed)
Pt ambulated hallways approx 64ft. With assist from RN. Appears still very weak - used support from gait belt and railing.  Tolerated well.

## 2012-11-04 NOTE — Progress Notes (Signed)
4 Days Post-Op  Subjective: No significant change. Still with poor appetite. HEENT no lifting with some assistance. Family expresses interest in skilled nursing facility rather than home health at this time.  Objective: Vital signs in last 24 hours: Temp:  [97.6 F (36.4 Trevino)-99.2 F (37.3 Trevino)] 99.2 F (37.3 Trevino) (06/22 0500) Pulse Rate:  [84-107] 87 (06/22 0500) Resp:  [18-20] 18 (06/22 0500) BP: (92-115)/(60-67) 111/60 mmHg (06/22 0500) SpO2:  [99 %-100 %] 100 % (06/22 0500) Last BM Date: 11/03/12  Intake/Output from previous day: 06/21 0701 - 06/22 0700 In: 600 [P.O.:600] Out: 450 [Urine:450] Intake/Output this shift:    General appearance: alert and no distress GI: Positive bowel sounds, soft, flat, expected postoperative tenderness. Incisions are clean dry and intact  Lab Results:   Recent Labs  11/03/12 0539  WBC 10.2  HGB 9.7*  HCT 29.1*  PLT 494*   BMET  Recent Labs  11/03/12 0539  NA 141  K 3.6  CL 104  CO2 32  GLUCOSE 109*  BUN 15  CREATININE 0.99  CALCIUM 8.5   PT/INR No results found for this basename: LABPROT, INR,  in the last 72 hours ABG No results found for this basename: PHART, PCO2, PO2, HCO3,  in the last 72 hours  Studies/Results: No results found.  Anti-infectives: Anti-infectives   Start     Dose/Rate Route Frequency Ordered Stop   10/31/12 0745  cefOXitin (MEFOXIN) 2 g in dextrose 5 % 50 mL IVPB     2 g 100 mL/hr over 30 Minutes Intravenous On call to O.R. 10/31/12 0737 10/31/12 1033      Assessment/Plan: s/p Procedure(s): LAPAROSCOPY DIAGNOSTIC (N/A) Failure to thrive, recent cholecystectomy, retrieval of Jackson-Pratt drain, depression. At this time it is his patient needs some degree of rehabilitation. As requested now being made for skilled nursing facility we'll have social worker/case worker again discussed options. Encourage supplementation usage. Increased activity.  LOS: 5 days    Edward Trevino 11/04/2012

## 2012-11-04 NOTE — Plan of Care (Signed)
Problem: Phase II Progression Outcomes Goal: Tolerating diet Outcome: Not Progressing Pt tolerates ensure and resource beverages - still declines food

## 2012-11-05 ENCOUNTER — Encounter (HOSPITAL_COMMUNITY): Payer: Self-pay | Admitting: General Surgery

## 2012-11-05 MED ORDER — BOOST / RESOURCE BREEZE PO LIQD: 1.0000 | Freq: Three times a day (TID) | ORAL | Status: DC

## 2012-11-05 MED ORDER — FENTANYL CITRATE 0.05 MG/ML IJ SOLN
INTRAMUSCULAR | Status: AC
  Filled 2012-11-05: qty 2

## 2012-11-05 MED ORDER — MIDAZOLAM HCL 2 MG/2ML IJ SOLN
INTRAMUSCULAR | Status: AC
  Filled 2012-11-05: qty 2

## 2012-11-05 NOTE — Progress Notes (Signed)
AVS reviewed with patient and patient's son.  Prescription provided for nutritional supplement.  Pt's son verbalized understanding of d/c instructions.  Educated patient and patient's son regarding watching for s/s of infection to port states.  Advise of follow-up needed with Dr. Leticia Penna.  Pt's son stated that he would call today for follow-up appointment.  States that he lives right beside of his father.  Teach back method used.  Pt's IV removed.  Site WNL.  Stated that all belongings intact at d/c.  Pt transported by NT via w/c to main entrance for discharge.  Pt stable at time of discharge.

## 2012-11-05 NOTE — Clinical Social Work Note (Signed)
CSW met with pt again this morning as notes from weekend indicate family interested in SNF. Pt alert and oriented and this was discussed with him again. He states he is not interested in SNF and wants to go home with home health. CSW spoke with both pt's son and daughter-in-law. At first, they really insisted that pt go to SNF. They were made aware that pt would have to be agreeable. Then they felt that pt would actually improve if he was back in his home environment. They were discussing making arrangements for pt to stay with someone short term until he is able to be alone again. Everyone is in agreement for full home health. Pt's son notified that they can work on placement from home if necessary with SNF list provided to them earlier and home health social worker. He is also willing to pursue mental health follow up for pt. Telepsych recommended decreasing Klonopin. Family did not want pt's medications changed. CM has discussed d/c plan with pt as well. CSW signing off but can be reconsulted if needed.  [name], [place] [phone]

## 2012-11-05 NOTE — Discharge Summary (Signed)
Physician Discharge Summary  Patient ID: Edward Trevino MRN: [id number] DOB/AGE: 05-21-1931 79 y.o.  Admit date: 10/30/2012 Discharge date: 11/05/2012  Admission Diagnoses: Nausea vomiting and abdominal pain  Discharge Diagnoses: The same, retained Jackson-Pratt drain Active Problems:   * No active hospital problems. *   Discharged Condition: stable  Hospital Course: Patient presented to [facility] for approximately one week following a laparoscopic cholecystectomy with drain placement. He returned with increased nausea and abdominal pain. Patient also has increased confusion per his family who were he returned to the hospital. Upon evaluation in the emergency department was noted the patient actually had a retained portion of his Jackson-Pratt drain. When investigated it became apparent that the patient had attempted to remove the drain by cutting the drain. The retained portion was completely intra-abdominal. This required return to the operating room for laparoscopic removal. He tolerated this well. His postoperative course was slow due to 2 poor diet progression and angulation. Patient was not willing to ambulate. He refused diet. Physical therapy was obtained. He was started on supplementation. Due to his limitations home health was established. Again his discharge was slightly delayed as patient decided he would rather be transferred to a skilled nursing facility. However again in discussion with the caseworker he decided he would again wish to return to home with home health assistance. This has been arranged. Patient will be discharged home.  Consults: None  Significant Diagnostic Studies: labs: Daily  Treatments: surgery: Laparoscopic drain removal  Discharge Exam: Blood pressure 101/69, pulse 80, temperature 99.6 F (37.6 C), temperature source Oral, resp. rate 20, height 6\' 3"  (1.905 m), weight 79.379 kg (175 lb), SpO2 97.00%. General appearance: alert, no distress and  Cachectic, flat affect Resp: clear to auscultation bilaterally Cardio: regular rate and rhythm GI: Positive bowel sounds, soft, expected postoperative tenderness. Incisions are clean dry and intact.  Disposition: 01-Home or Self Care  Discharge Orders   Future Appointments Provider Department Dept Phone   11/13/2012 10:00 AM Nira Retort, NP [facility] [phone]   Future Orders Complete By Expires     Diet - low sodium heart healthy  As directed     Discharge instructions  As directed     Comments:      Increase activity as tolerated. May place ice pack for comfort.  Alternate an anti-inflammatory such as ibuprofen (Motrin, Advil) 400-600mg  every 6 hours with the prescribed pain medication.   Do not take any additional acetaminophen as there is Tylenol in the pain medication.    Increase activity slowly  As directed         Medication List    TAKE these medications       clonazePAM 0.5 MG tablet  Commonly known as:  KLONOPIN  Take 1 tablet (0.5 mg total) by mouth 3 (three) times daily. Takes one tablet twice daily and 2 tablets at bedtime.     feeding supplement Liqd  Take 1 Container by mouth 3 (three) times daily between meals.     HYDROcodone-acetaminophen 5-325 MG per tablet  Commonly known as:  NORCO  Take 1 tablet by mouth every 6 (six) hours as needed for pain.     mirtazapine 30 MG tablet  Commonly known as:  REMERON  Take 1 tablet (30 mg total) by mouth at bedtime.     multivitamin with minerals Tabs  Take 1 tablet by mouth daily.     QUEtiapine 100 MG tablet  Commonly known as:  SEROQUEL  Take 1 tablet (100 mg total) by mouth 3 (three) times daily.     venlafaxine XR 75 MG 24 hr capsule  Commonly known as:  EFFEXOR-XR  Take 1 capsule (75 mg total) by mouth daily.     vitamin C 500 MG tablet  Commonly known as:  ASCORBIC ACID  Take 500 mg by mouth daily.           Follow-up Information   Follow up with [facility].   Contact information:   [address] [place] [address] [phone]      Signed: Fabio Bering 11/05/2012, 11:04 AM

## 2012-11-08 ENCOUNTER — Emergency Department (HOSPITAL_COMMUNITY): Payer: Medicare Other

## 2012-11-08 ENCOUNTER — Encounter (HOSPITAL_COMMUNITY): Payer: Self-pay

## 2012-11-08 ENCOUNTER — Inpatient Hospital Stay (HOSPITAL_COMMUNITY)
Admission: EM | Admit: 2012-11-08 | Discharge: 2012-11-13 | DRG: 689 | Disposition: A | Discharge status: Skilled Nursing Facility | Payer: Medicare Other | Attending: General Surgery | Admitting: General Surgery

## 2012-11-08 DIAGNOSIS — Z79899 Other long term (current) drug therapy: Secondary | ICD-10-CM

## 2012-11-08 DIAGNOSIS — F3289 Other specified depressive episodes: Secondary | ICD-10-CM | POA: Diagnosis present

## 2012-11-08 DIAGNOSIS — R627 Adult failure to thrive: Secondary | ICD-10-CM | POA: Diagnosis present

## 2012-11-08 DIAGNOSIS — E873 Alkalosis: Secondary | ICD-10-CM | POA: Diagnosis present

## 2012-11-08 DIAGNOSIS — R112 Nausea with vomiting, unspecified: Secondary | ICD-10-CM

## 2012-11-08 DIAGNOSIS — G4700 Insomnia, unspecified: Secondary | ICD-10-CM | POA: Diagnosis present

## 2012-11-08 DIAGNOSIS — D649 Anemia, unspecified: Secondary | ICD-10-CM

## 2012-11-08 DIAGNOSIS — D75839 Thrombocytosis, unspecified: Secondary | ICD-10-CM

## 2012-11-08 DIAGNOSIS — F411 Generalized anxiety disorder: Secondary | ICD-10-CM | POA: Diagnosis present

## 2012-11-08 DIAGNOSIS — N390 Urinary tract infection, site not specified: Principal | ICD-10-CM | POA: Diagnosis present

## 2012-11-08 DIAGNOSIS — F17200 Nicotine dependence, unspecified, uncomplicated: Secondary | ICD-10-CM | POA: Diagnosis present

## 2012-11-08 DIAGNOSIS — K5900 Constipation, unspecified: Secondary | ICD-10-CM | POA: Diagnosis present

## 2012-11-08 DIAGNOSIS — R45851 Suicidal ideations: Secondary | ICD-10-CM

## 2012-11-08 DIAGNOSIS — R748 Abnormal levels of other serum enzymes: Secondary | ICD-10-CM

## 2012-11-08 DIAGNOSIS — IMO0002 Reserved for concepts with insufficient information to code with codable children: Secondary | ICD-10-CM

## 2012-11-08 DIAGNOSIS — D72829 Elevated white blood cell count, unspecified: Secondary | ICD-10-CM | POA: Diagnosis present

## 2012-11-08 DIAGNOSIS — F329 Major depressive disorder, single episode, unspecified: Secondary | ICD-10-CM | POA: Diagnosis present

## 2012-11-08 DIAGNOSIS — Z9089 Acquired absence of other organs: Secondary | ICD-10-CM

## 2012-11-08 DIAGNOSIS — R109 Unspecified abdominal pain: Secondary | ICD-10-CM

## 2012-11-08 DIAGNOSIS — E41 Nutritional marasmus: Secondary | ICD-10-CM | POA: Diagnosis present

## 2012-11-08 DIAGNOSIS — D473 Essential (hemorrhagic) thrombocythemia: Secondary | ICD-10-CM

## 2012-11-08 DIAGNOSIS — H9190 Unspecified hearing loss, unspecified ear: Secondary | ICD-10-CM | POA: Diagnosis present

## 2012-11-08 LAB — URINALYSIS, ROUTINE W REFLEX MICROSCOPIC
Glucose, UA: NEGATIVE mg/dL
Leukocytes, UA: NEGATIVE
Nitrite: NEGATIVE
Specific Gravity, Urine: 1.01 (ref 1.005–1.030)
Urobilinogen, UA: 0.2 mg/dL (ref 0.0–1.0)

## 2012-11-08 LAB — COMPREHENSIVE METABOLIC PANEL
Albumin: 3.2 g/dL — ABNORMAL LOW (ref 3.5–5.2)
Alkaline Phosphatase: 498 U/L — ABNORMAL HIGH (ref 39–117)
BUN: 21 mg/dL (ref 6–23)
CO2: 35 mEq/L — ABNORMAL HIGH (ref 19–32)
Calcium: 9.2 mg/dL (ref 8.4–10.5)
Creatinine, Ser: 1.3 mg/dL (ref 0.50–1.35)
Potassium: 3.5 mEq/L (ref 3.5–5.1)
Total Protein: 7.4 g/dL (ref 6.0–8.3)

## 2012-11-08 LAB — CBC WITH DIFFERENTIAL/PLATELET
Basophils Relative: 0 % (ref 0–1)
Eosinophils Absolute: 0 10*3/uL (ref 0.0–0.7)
Hemoglobin: 11.9 g/dL — ABNORMAL LOW (ref 13.0–17.0)
Lymphs Abs: 0.6 10*3/uL — ABNORMAL LOW (ref 0.7–4.0)
MCH: 31.9 pg (ref 26.0–34.0)
MCHC: 34 g/dL (ref 30.0–36.0)
Monocytes Relative: 6 % (ref 3–12)
Neutrophils Relative %: 90 % — ABNORMAL HIGH (ref 43–77)
RBC: 3.73 MIL/uL — ABNORMAL LOW (ref 4.22–5.81)

## 2012-11-08 LAB — LACTIC ACID, PLASMA: Lactic Acid, Venous: 1.7 mmol/L (ref 0.5–2.2)

## 2012-11-08 LAB — URINE MICROSCOPIC-ADD ON

## 2012-11-08 LAB — LIPASE, BLOOD: Lipase: 24 U/L (ref 11–59)

## 2012-11-08 MED ORDER — VENLAFAXINE HCL ER 75 MG PO CP24
75.0000 mg | ORAL_CAPSULE | Freq: Every day | ORAL | Status: DC
  Administered 2012-11-08 – 2012-11-13 (×6): 75 mg via ORAL
  Filled 2012-11-08 (×8): qty 1

## 2012-11-08 MED ORDER — CLONAZEPAM 0.5 MG PO TABS
0.5000 mg | ORAL_TABLET | Freq: Three times a day (TID) | ORAL | Status: DC
  Administered 2012-11-08 – 2012-11-13 (×15): 0.5 mg via ORAL
  Filled 2012-11-08 (×15): qty 1

## 2012-11-08 MED ORDER — IOHEXOL 300 MG/ML  SOLN
50.0000 mL | Freq: Once | INTRAMUSCULAR | Status: AC | PRN
  Administered 2012-11-08: 50 mL via ORAL

## 2012-11-08 MED ORDER — HYDROCODONE-ACETAMINOPHEN 5-325 MG PO TABS: 1.0000 | ORAL_TABLET | ORAL | Status: DC | PRN

## 2012-11-08 MED ORDER — QUETIAPINE FUMARATE 100 MG PO TABS
100.0000 mg | ORAL_TABLET | Freq: Three times a day (TID) | ORAL | Status: DC
  Administered 2012-11-08 – 2012-11-13 (×15): 100 mg via ORAL
  Filled 2012-11-08 (×19): qty 1

## 2012-11-08 MED ORDER — METOPROLOL TARTRATE 1 MG/ML IV SOLN: 10.0000 mg | Freq: Once | INTRAVENOUS | Status: DC

## 2012-11-08 MED ORDER — PIPERACILLIN-TAZOBACTAM 3.375 G IVPB
3.3750 g | Freq: Three times a day (TID) | INTRAVENOUS | Status: DC
  Administered 2012-11-08 – 2012-11-13 (×15): 3.375 g via INTRAVENOUS
  Filled 2012-11-08 (×19): qty 50

## 2012-11-08 MED ORDER — IOHEXOL 300 MG/ML  SOLN
100.0000 mL | Freq: Once | INTRAMUSCULAR | Status: AC | PRN
  Administered 2012-11-08: 100 mL via INTRAVENOUS

## 2012-11-08 MED ORDER — FLEET ENEMA 7-19 GM/118ML RE ENEM: 1.0000 | ENEMA | Freq: Once | RECTAL | Status: AC | PRN

## 2012-11-08 MED ORDER — PROMETHAZINE HCL 25 MG/ML IJ SOLN
12.5000 mg | Freq: Once | INTRAMUSCULAR | Status: AC
  Administered 2012-11-08: 12.5 mg via INTRAVENOUS
  Filled 2012-11-08: qty 1

## 2012-11-08 MED ORDER — ONDANSETRON HCL 4 MG/2ML IJ SOLN
4.0000 mg | Freq: Once | INTRAMUSCULAR | Status: AC
  Administered 2012-11-08: 4 mg via INTRAVENOUS
  Filled 2012-11-08: qty 2

## 2012-11-08 MED ORDER — MORPHINE SULFATE 2 MG/ML IJ SOLN: 1.0000 mg | INTRAMUSCULAR | Status: DC | PRN

## 2012-11-08 MED ORDER — ONDANSETRON HCL 4 MG/2ML IJ SOLN: 4.0000 mg | Freq: Once | INTRAMUSCULAR | Status: DC

## 2012-11-08 MED ORDER — HYDROMORPHONE HCL PF 1 MG/ML IJ SOLN
1.0000 mg | Freq: Once | INTRAMUSCULAR | Status: AC
  Administered 2012-11-08: 1 mg via INTRAVENOUS
  Filled 2012-11-08: qty 1

## 2012-11-08 MED ORDER — LACTATED RINGERS IV SOLN
INTRAVENOUS | Status: DC
  Administered 2012-11-08 – 2012-11-13 (×4): via INTRAVENOUS

## 2012-11-08 MED ORDER — ENOXAPARIN SODIUM 40 MG/0.4ML ~~LOC~~ SOLN
40.0000 mg | SUBCUTANEOUS | Status: DC
  Administered 2012-11-08 – 2012-11-13 (×6): 40 mg via SUBCUTANEOUS
  Filled 2012-11-08 (×6): qty 0.4

## 2012-11-08 MED ORDER — PANTOPRAZOLE SODIUM 40 MG IV SOLR
40.0000 mg | Freq: Two times a day (BID) | INTRAVENOUS | Status: DC
  Administered 2012-11-08 – 2012-11-10 (×5): 40 mg via INTRAVENOUS
  Filled 2012-11-08 (×6): qty 40

## 2012-11-08 MED ORDER — METOCLOPRAMIDE HCL 5 MG/ML IJ SOLN
10.0000 mg | Freq: Once | INTRAMUSCULAR | Status: AC
  Administered 2012-11-08: 10 mg via INTRAVENOUS
  Filled 2012-11-08: qty 2

## 2012-11-08 MED ORDER — SODIUM CHLORIDE 0.9 % IV SOLN
Freq: Once | INTRAVENOUS | Status: AC
  Administered 2012-11-08: 05:00:00 via INTRAVENOUS

## 2012-11-08 MED ORDER — MIRTAZAPINE 30 MG PO TABS
30.0000 mg | ORAL_TABLET | Freq: Every day | ORAL | Status: DC
  Administered 2012-11-08 – 2012-11-12 (×5): 30 mg via ORAL
  Filled 2012-11-08 (×6): qty 1

## 2012-11-08 MED ORDER — ONDANSETRON HCL 4 MG/2ML IJ SOLN: 4.0000 mg | Freq: Four times a day (QID) | INTRAMUSCULAR | Status: DC | PRN

## 2012-11-08 MED ORDER — BISACODYL 10 MG RE SUPP: 10.0000 mg | Freq: Every day | RECTAL | Status: DC | PRN

## 2012-11-08 MED ORDER — ONDANSETRON HCL 4 MG/2ML IJ SOLN
INTRAMUSCULAR | Status: AC
  Administered 2012-11-08: 4 mg via INTRAVENOUS
  Filled 2012-11-08: qty 2

## 2012-11-08 MED ORDER — BOOST / RESOURCE BREEZE PO LIQD
1.0000 | Freq: Three times a day (TID) | ORAL | Status: DC
  Administered 2012-11-08 – 2012-11-13 (×11): 1 via ORAL
  Filled 2012-11-08: qty 1

## 2012-11-08 NOTE — ED Notes (Signed)
Pt had cholecystectomy last week, now with abd pain and vomiting dark foul smelling emesis

## 2012-11-08 NOTE — Clinical Social Work Placement (Signed)
Clinical Social Work Department CLINICAL SOCIAL WORK PLACEMENT NOTE 11/08/2012  Patient:  Edward Trevino, Edward Trevino  Account Number:  [id number] Admit date:  11/08/2012  Clinical Social Worker:  [name], LCSW  Date/time:  11/08/2012 01:40 PM  Clinical Social Work is seeking post-discharge placement for this patient at the following level of care:   SKILLED NURSING   (*CSW will update this form in Epic as items are completed)   11/08/2012  Patient/family provided with [facility] Department of Clinical Social Work's list of facilities offering this level of care within the geographic area requested by the patient (or if unable, by the patient's family).  11/08/2012  Patient/family informed of their freedom to choose among providers that offer the needed level of care, that participate in Medicare, Medicaid or managed care program needed by the patient, have an available bed and are willing to accept the patient.  11/08/2012  Patient/family informed of [facility]' ownership interest in [facility], as well as of the fact that they are under no obligation to receive care at this facility.  PASARR submitted to [facility] on 11/08/2012 PASARR number received from [employer] on   FL2 transmitted to all facilities in geographic area requested by pt/family on  11/08/2012 FL2 transmitted to all facilities within larger geographic area on   Patient informed that his/her managed care company has contracts with or will negotiate with  certain facilities, including the following:     Patient/family informed of bed offers received:   Patient chooses bed at  Physician recommends and patient chooses bed at    Patient to be transferred to  on   Patient to be transferred to facility by   The following physician request were entered in Epic:   Additional Comments:  [name], LCSW [phone]

## 2012-11-08 NOTE — Clinical Social Work Psychosocial (Signed)
Clinical Social Work Department BRIEF PSYCHOSOCIAL ASSESSMENT 11/08/2012  Patient:  Edward Trevino, Edward Trevino     Account Number:  [id number]     Admit date:  11/08/2012  Clinical Social Worker:  Nancie Neas  Date/Time:  11/08/2012 01:45 PM  Referred by:  Physician  Date Referred:  11/08/2012 Referred for  SNF Placement   Other Referral:   Interview type:  Patient Other interview type:   and son- Edward Trevino    PSYCHOSOCIAL DATA Living Status:  ALONE Admitted from facility:   Level of care:   Primary support name:  Edward Trevino Primary support relationship to patient:  CHILD, ADULT Degree of support available:   supportive    CURRENT CONCERNS Current Concerns  Post-Acute Placement   Other Concerns:    SOCIAL WORK ASSESSMENT / PLAN CSW met with pt at bedside. Pt known to CSW from admission last week and d/c this past Monday. He was referred for SNF at that time, but ultimately refused despite family's encouragement to try rehab. Pt alert and oriented to self and place only currently. CSW discussed possibility of SNF at d/c and he said he would consider. He gave permission for CSW to speak with his son. Per [name], pt has not done well at home the last few days. He has been unable to get out of bed and has not been eating or drinking as he states he has no appetite due to nausea. Family/friends have been staying with pt or checking on him frequently. Son and daughter-in-law are very involved and supportive. After initially refusing SNF last hospitalization, family felt he could benefit from it but pt would not agree. CSW discussed placement process again including Medicare coverage/criteria and provided SNF list. Edward Trevino requests [place] if possible. Currently, pt does not appear to be as depressed or irritable as last hospitalization and family feel his loss of appetite is all related to nausea.   Assessment/plan status:  Psychosocial Support/Ongoing Assessment of Needs Other assessment/ plan:    Information/referral to community resources:   SNF list    PATIENT'S/FAMILY'S RESPONSE TO PLAN OF CARE: Pt wants his son and daughter-in-law to be very involved in decision making for d/c planning. CSW discussed SNF with them and they are fully supportive of this. CSW initiated pasarr request and will fax out FL2. Will return with bed offers when available.       [name], [place] [phone]

## 2012-11-08 NOTE — ED Provider Notes (Signed)
History    CSN: [id number] Arrival date & time 11/08/12  0507  First MD Initiated Contact with Patient 11/08/12 0515     Chief Complaint  Patient presents with  . Abdominal Pain  . Emesis   (Consider location/radiation/quality/duration/timing/severity/associated sxs/prior Treatment) Patient is a 79 y.o. male presenting with abdominal pain and vomiting. The history is provided by the patient.  Abdominal Pain Associated symptoms include abdominal pain.  Emesis Associated symptoms: abdominal pain   He has been having abdominal pain for the last 3 days which is getting progressively worse. This is associated with nausea and vomiting. Emesis has been dark and foul-smelling., Pain is generalized and severe and he rates it an 8/10. He has not passed any flatus or had a bowel movement. He had a laparoscopic cholecystectomy done on June 13 and on June 18 had another laparoscopic surgery for retrieval of a Jackson-Pratt drain. He denies fever but has had chills and sweats. Nothing makes his pain better nothing makes it worse. He states it is not better after vomiting. Past Medical History  Diagnosis Date  . Depression   . Insomnia   . Anxiety   . Suicide attempt   . Erosive esophagitis 07/25/2012  . Gastric out let obstruction 07/25/2012  . HOH (hard of hearing)    Past Surgical History  Procedure Laterality Date  . Appendectomy    . Back surgery    . Ercp N/A 07/24/2012    Dr. Jena Gauss. Significant abnormalities of the bowl and proximal second portion producing partial gastric outlet stricture and with secondary gastric dilation and severe erosive reflux esophagitis. Biopsy showed benign ulceration. Normal-appearing ampulla, status post biliary sphincterotomy and ampulla balloon dilation, stone extraction and stent placement.  Marland Kitchen Sphincterotomy N/A 07/24/2012    Procedure: SPHINCTEROTOMY;  Surgeon: Corbin Ade, MD;  Location: [facility];  Service: Endoscopy;  Laterality: N/A;  . Esophageal  biopsy N/A 07/24/2012    Procedure: BIOPSY;  Surgeon: Corbin Ade, MD;  Location: [facility];  Service: Endoscopy;  Laterality: N/A;  Duodenal and Esophageal Biopsies  . Esophagogastroduodenoscopy N/A 07/24/2012    [id number] ampulla s/p biliary sphincterotomy and ampullary  . Balloon dilation N/A 07/24/2012    Procedure: BALLOON DILATION;  Surgeon: Corbin Ade, MD;  Location: [facility];  Service: Endoscopy;  Laterality: N/A;  Balloon Stone Extraction, Drudging  . Removal of stones N/A 07/24/2012    Procedure: REMOVAL OF STONES;  Surgeon: Corbin Ade, MD;  Location: [facility];  Service: Endoscopy;  Laterality: N/A;  . Egd/ercp  10/18/2012    Dr. Jena Gauss: ;yloric channel stenosis, s/p dilation and bx, persisting CBD stones with extension of sphincterotomy and sphincterotomy balloon dilation and stone extraction. Removal of biliary stent  . Esophagogastroduodenoscopy (egd) with propofol N/A 10/18/2012    Procedure: ESOPHAGOGASTRODUODENOSCOPY (EGD) WITH PROPOFOL;  Surgeon: Corbin Ade, MD;  Location: [facility];  Service: Endoscopy;  Laterality: N/A;  . Ercp N/A 10/18/2012    Procedure: ENDOSCOPIC RETROGRADE CHOLANGIOPANCREATOGRAPHY (ERCP);  Surgeon: Corbin Ade, MD;  Location: [facility];  Service: Endoscopy;  Laterality: N/A;  . Removal of stones N/A 10/18/2012    Procedure: REMOVAL OF STONES;  Surgeon: Corbin Ade, MD;  Location: [facility];  Service: Endoscopy;  Laterality: N/A;  . Esophageal biopsy N/A 10/18/2012    Procedure: BIOPSY;  Surgeon: Corbin Ade, MD;  Location: [facility];  Service: Endoscopy;  Laterality: N/A;  . Cholecystectomy N/A 10/26/2012    Procedure: LAPAROSCOPIC CHOLECYSTECTOMY;  Surgeon: Loraine Leriche  Val Riles, MD;  Location: [facility];  Service: General;  Laterality: N/A;  . Laparoscopy N/A 10/31/2012    Procedure: LAPAROSCOPY DIAGNOSTIC;  Surgeon: Fabio Bering, MD;  Location: [facility];  Service: General;  Laterality: N/A;   Family History  Problem Relation Age of Onset  . Colon cancer Neg  Hx    History  Substance Use Topics  . Smoking status: Current Every Day Smoker    Types: Cigars  . Smokeless tobacco: Not on file  . Alcohol Use: No     Comment: reported from ED son stated he has been drinking a lot of wine recently    Review of Systems  Gastrointestinal: Positive for vomiting and abdominal pain.  All other systems reviewed and are negative.    Allergies  Review of patient's allergies indicates no known allergies.  Home Medications   Current Outpatient Rx  Name  Route  Sig  Dispense  Refill  . clonazePAM (KLONOPIN) 0.5 MG tablet   Oral   Take 1 tablet (0.5 mg total) by mouth 3 (three) times daily. Takes one tablet twice daily and 2 tablets at bedtime.   90 tablet   0   . feeding supplement (RESOURCE BREEZE) LIQD   Oral   Take 1 Container by mouth 3 (three) times daily between meals.   20 Container   2   . HYDROcodone-acetaminophen (NORCO) 5-325 MG per tablet   Oral   Take 1 tablet by mouth every 6 (six) hours as needed for pain.   40 tablet   0   . mirtazapine (REMERON) 30 MG tablet   Oral   Take 1 tablet (30 mg total) by mouth at bedtime.   30 tablet   0   . Multiple Vitamin (MULTIVITAMIN WITH MINERALS) TABS   Oral   Take 1 tablet by mouth daily.         . QUEtiapine (SEROQUEL) 100 MG tablet   Oral   Take 1 tablet (100 mg total) by mouth 3 (three) times daily.   90 tablet   0   . venlafaxine XR (EFFEXOR-XR) 75 MG 24 hr capsule   Oral   Take 1 capsule (75 mg total) by mouth daily.   30 capsule   0   . vitamin C (ASCORBIC ACID) 500 MG tablet   Oral   Take 500 mg by mouth daily.          BP 129/77  Pulse 106  Temp(Src) 99 F (37.2 C) (Oral)  Resp 18  Ht 6' 3.25" (1.911 m)  Wt 180 lb (81.647 kg)  BMI 22.36 kg/m2  SpO2 98% Physical Exam  Nursing note and vitals reviewed.  79 year old male, who appears moderately ill, but is in no acute distress. Vital signs are significant for tachycardia with heart rate 106. Oxygen  saturation is 98%, which is normal. Head is normocephalic and atraumatic. PERRLA, EOMI. Oropharynx is clear. Neck is nontender and supple without adenopathy or JVD. Back is nontender and there is no CVA tenderness. Lungs are clear without rales, wheezes, or rhonchi. Chest is nontender. Heart has regular rate and rhythm without murmur. Abdomen is soft, mildly distended, with mild diffuse tenderness and moderate right lower quadrant tenderness. There is no rebound or guarding. Incisions from recent laparoscopic surgery are present with staples in place and no evidence of infection. There are no masses or hepatosplenomegaly and peristalsis is present but hypoactive with some high-pitched sounds. Extremities have no cyanosis or edema, full  range of motion is present. Skin is pale, warm, and dry without rash. Neurologic: Mental status is normal, cranial nerves are intact, there are no motor or sensory deficits.  ED Course  Procedures (including critical care time) Results for orders placed during the hospital encounter of 11/08/12  CBC WITH DIFFERENTIAL      Result Value Range   WBC 15.4 (*) 4.0 - 10.5 K/uL   RBC 3.73 (*) 4.22 - 5.81 MIL/uL   Hemoglobin 11.9 (*) 13.0 - 17.0 g/dL   HCT 16.1 (*) 09.6 - 04.5 %   MCV 93.8  78.0 - 100.0 fL   MCH 31.9  26.0 - 34.0 pg   MCHC 34.0  30.0 - 36.0 g/dL   RDW 40.9  81.1 - 91.4 %   Platelets 555 (*) 150 - 400 K/uL   Neutrophils Relative % 90 (*) 43 - 77 %   Neutro Abs 13.8 (*) 1.7 - 7.7 K/uL   Lymphocytes Relative 4 (*) 12 - 46 %   Lymphs Abs 0.6 (*) 0.7 - 4.0 K/uL   Monocytes Relative 6  3 - 12 %   Monocytes Absolute 1.0  0.1 - 1.0 K/uL   Eosinophils Relative 0  0 - 5 %   Eosinophils Absolute 0.0  0.0 - 0.7 K/uL   Basophils Relative 0  0 - 1 %   Basophils Absolute 0.0  0.0 - 0.1 K/uL  COMPREHENSIVE METABOLIC PANEL      Result Value Range   Sodium 142  135 - 145 mEq/L   Potassium 3.5  3.5 - 5.1 mEq/L   Chloride 98  96 - 112 mEq/L   CO2 35 (*)  19 - 32 mEq/L   Glucose, Bld 192 (*) 70 - 99 mg/dL   BUN 21  6 - 23 mg/dL   Creatinine, Ser 7.82  0.50 - 1.35 mg/dL   Calcium 9.2  8.4 - 95.6 mg/dL   Total Protein 7.4  6.0 - 8.3 g/dL   Albumin 3.2 (*) 3.5 - 5.2 g/dL   AST 48 (*) 0 - 37 U/L   ALT 73 (*) 0 - 53 U/L   Alkaline Phosphatase 498 (*) 39 - 117 U/L   Total Bilirubin 0.5  0.3 - 1.2 mg/dL   GFR calc non Af Amer 51 (*) >90 mL/min   GFR calc Af Amer 59 (*) >90 mL/min  LIPASE, BLOOD      Result Value Range   Lipase 24  11 - 59 U/L  LACTIC ACID, PLASMA      Result Value Range   Lactic Acid, Venous 1.7  0.5 - 2.2 mmol/L       1. Abdominal pain   2. Nausea & vomiting   3. Anemia   4. Metabolic alkalosis   5. Elevated liver enzymes   6. Thrombocytosis     MDM  Abdominal pain and vomiting post laparoscopic surgery concerning for possible bowel obstruction. Old records are reviewed and he did have laparoscopic cholecystectomy on June 13 and laparoscopic surgery for retrieval of Jackson-Pratt drain on June 18. He'll be given IV fluids, IV hydromorphone, IV ondansetron and CT scan will be obtained.  He feels better after hydromorphone and ondansetron. WBC is elevated, alkaline phosphatase increased over last reported values. CT is pending. Case has been discussed with Dr. Leticia Penna who agrees to see the patient in the ED.  Dione Booze, MD 11/08/12 [phone]

## 2012-11-08 NOTE — ED Notes (Signed)
Small amount, approximately 20cc, dark brown emesis.

## 2012-11-08 NOTE — H&P (Signed)
Edward Trevino is an 79 y.o. male.   Chief Complaint: Abdominal pain, nausea, vomiting.   HPI: Patient presents to [facility] ED with increased nausea vomiting.  Has increased over the last 2 days.  Appetitie remains poor.  Only drinking a cup of coffee per day.  Denies fever or chills.  NO BM.  Pain diffuse.  No exacerbating features.  Family states patient has refused to get out of bed since he got home.    Past Medical History  Diagnosis Date  . Depression   . Insomnia   . Anxiety   . Suicide attempt   . Erosive esophagitis 07/25/2012  . Gastric out let obstruction 07/25/2012  . HOH (hard of hearing)     Past Surgical History  Procedure Laterality Date  . Appendectomy    . Back surgery    . Ercp N/A 07/24/2012    Dr. Jena Gauss. Significant abnormalities of the bowl and proximal second portion producing partial gastric outlet stricture and with secondary gastric dilation and severe erosive reflux esophagitis. Biopsy showed benign ulceration. Normal-appearing ampulla, status post biliary sphincterotomy and ampulla balloon dilation, stone extraction and stent placement.  Marland Kitchen Sphincterotomy N/A 07/24/2012    Procedure: SPHINCTEROTOMY;  Surgeon: Corbin Ade, MD;  Location: [facility];  Service: Endoscopy;  Laterality: N/A;  . Esophageal biopsy N/A 07/24/2012    Procedure: BIOPSY;  Surgeon: Corbin Ade, MD;  Location: [facility];  Service: Endoscopy;  Laterality: N/A;  Duodenal and Esophageal Biopsies  . Esophagogastroduodenoscopy N/A 07/24/2012    [id number] ampulla s/p biliary sphincterotomy and ampullary  . Balloon dilation N/A 07/24/2012    Procedure: BALLOON DILATION;  Surgeon: Corbin Ade, MD;  Location: [facility];  Service: Endoscopy;  Laterality: N/A;  Balloon Stone Extraction, Drudging  . Removal of stones N/A 07/24/2012    Procedure: REMOVAL OF STONES;  Surgeon: Corbin Ade, MD;  Location: [facility];  Service: Endoscopy;  Laterality: N/A;  . Egd/ercp  10/18/2012    Dr. Jena Gauss: ;yloric channel  stenosis, s/p dilation and bx, persisting CBD stones with extension of sphincterotomy and sphincterotomy balloon dilation and stone extraction. Removal of biliary stent  . Esophagogastroduodenoscopy (egd) with propofol N/A 10/18/2012    Procedure: ESOPHAGOGASTRODUODENOSCOPY (EGD) WITH PROPOFOL;  Surgeon: Corbin Ade, MD;  Location: [facility];  Service: Endoscopy;  Laterality: N/A;  . Ercp N/A 10/18/2012    Procedure: ENDOSCOPIC RETROGRADE CHOLANGIOPANCREATOGRAPHY (ERCP);  Surgeon: Corbin Ade, MD;  Location: [facility];  Service: Endoscopy;  Laterality: N/A;  . Removal of stones N/A 10/18/2012    Procedure: REMOVAL OF STONES;  Surgeon: Corbin Ade, MD;  Location: [facility];  Service: Endoscopy;  Laterality: N/A;  . Esophageal biopsy N/A 10/18/2012    Procedure: BIOPSY;  Surgeon: Corbin Ade, MD;  Location: [facility];  Service: Endoscopy;  Laterality: N/A;  . Cholecystectomy N/A 10/26/2012    Procedure: LAPAROSCOPIC CHOLECYSTECTOMY;  Surgeon: Dalia Heading, MD;  Location: [facility];  Service: General;  Laterality: N/A;  . Laparoscopy N/A 10/31/2012    Procedure: LAPAROSCOPY DIAGNOSTIC;  Surgeon: Fabio Bering, MD;  Location: [facility];  Service: General;  Laterality: N/A;    Family History  Problem Relation Age of Onset  . Colon cancer Neg Hx    Social History:  reports that he has been smoking Cigars.  He does not have any smokeless tobacco history on file. He reports that he does not drink alcohol or use illicit drugs.  Allergies: No Known Allergies  Medications Prior to Admission  Medication Sig Dispense Refill  . clonazePAM (KLONOPIN) 0.5 MG tablet Take 1 tablet (0.5 mg total) by mouth 3 (three) times daily. Takes one tablet twice daily and 2 tablets at bedtime.  90 tablet  0  . feeding supplement (RESOURCE BREEZE) LIQD Take 1 Container by mouth 3 (three) times daily between meals.  20 Container  2  . HYDROcodone-acetaminophen (NORCO) 5-325 MG per tablet Take 1 tablet by mouth every 6 (six) hours as  needed for pain.  40 tablet  0  . mirtazapine (REMERON) 30 MG tablet Take 1 tablet (30 mg total) by mouth at bedtime.  30 tablet  0  . Multiple Vitamin (MULTIVITAMIN WITH MINERALS) TABS Take 1 tablet by mouth daily.      . QUEtiapine (SEROQUEL) 100 MG tablet Take 1 tablet (100 mg total) by mouth 3 (three) times daily.  90 tablet  0  . venlafaxine XR (EFFEXOR-XR) 75 MG 24 hr capsule Take 1 capsule (75 mg total) by mouth daily.  30 capsule  0    Results for orders placed during the hospital encounter of 11/08/12 (from the past 48 hour(s))  CBC WITH DIFFERENTIAL     Status: Abnormal   Collection Time    11/08/12  5:22 AM      Result Value Range   WBC 15.4 (*) 4.0 - 10.5 K/uL   RBC 3.73 (*) 4.22 - 5.81 MIL/uL   Hemoglobin 11.9 (*) 13.0 - 17.0 g/dL   HCT 16.1 (*) 09.6 - 04.5 %   MCV 93.8  78.0 - 100.0 fL   MCH 31.9  26.0 - 34.0 pg   MCHC 34.0  30.0 - 36.0 g/dL   RDW 40.9  81.1 - 91.4 %   Platelets 555 (*) 150 - 400 K/uL   Neutrophils Relative % 90 (*) 43 - 77 %   Neutro Abs 13.8 (*) 1.7 - 7.7 K/uL   Lymphocytes Relative 4 (*) 12 - 46 %   Lymphs Abs 0.6 (*) 0.7 - 4.0 K/uL   Monocytes Relative 6  3 - 12 %   Monocytes Absolute 1.0  0.1 - 1.0 K/uL   Eosinophils Relative 0  0 - 5 %   Eosinophils Absolute 0.0  0.0 - 0.7 K/uL   Basophils Relative 0  0 - 1 %   Basophils Absolute 0.0  0.0 - 0.1 K/uL  COMPREHENSIVE METABOLIC PANEL     Status: Abnormal   Collection Time    11/08/12  5:22 AM      Result Value Range   Sodium 142  135 - 145 mEq/L   Potassium 3.5  3.5 - 5.1 mEq/L   Chloride 98  96 - 112 mEq/L   CO2 35 (*) 19 - 32 mEq/L   Glucose, Bld 192 (*) 70 - 99 mg/dL   BUN 21  6 - 23 mg/dL   Creatinine, Ser 7.82  0.50 - 1.35 mg/dL   Calcium 9.2  8.4 - 95.6 mg/dL   Total Protein 7.4  6.0 - 8.3 g/dL   Albumin 3.2 (*) 3.5 - 5.2 g/dL   AST 48 (*) 0 - 37 U/L   ALT 73 (*) 0 - 53 U/L   Alkaline Phosphatase 498 (*) 39 - 117 U/L   Total Bilirubin 0.5  0.3 - 1.2 mg/dL   GFR calc non Af Amer  51 (*) >90 mL/min   GFR calc Af Amer 59 (*) >90 mL/min   Comment:  The eGFR has been calculated     using the CKD EPI equation.     This calculation has not been     validated in all clinical     situations.     eGFR's persistently     <90 mL/min signify     possible Chronic Kidney Disease.  LIPASE, BLOOD     Status: None   Collection Time    11/08/12  5:22 AM      Result Value Range   Lipase 24  11 - 59 U/L  LACTIC ACID, PLASMA     Status: None   Collection Time    11/08/12  5:34 AM      Result Value Range   Lactic Acid, Venous 1.7  0.5 - 2.2 mmol/L  URINALYSIS, ROUTINE W REFLEX MICROSCOPIC     Status: Abnormal   Collection Time    11/08/12 10:47 AM      Result Value Range   Color, Urine YELLOW  YELLOW   APPearance CLEAR  CLEAR   Specific Gravity, Urine 1.010  1.005 - 1.030   pH 8.5 (*) 5.0 - 8.0   Glucose, UA NEGATIVE  NEGATIVE mg/dL   Hgb urine dipstick NEGATIVE  NEGATIVE   Bilirubin Urine NEGATIVE  NEGATIVE   Ketones, ur TRACE (*) NEGATIVE mg/dL   Protein, ur 30 (*) NEGATIVE mg/dL   Urobilinogen, UA 0.2  0.0 - 1.0 mg/dL   Nitrite NEGATIVE  NEGATIVE   Leukocytes, UA NEGATIVE  NEGATIVE  URINE MICROSCOPIC-ADD ON     Status: Abnormal   Collection Time    11/08/12 10:47 AM      Result Value Range   RBC / HPF 0-2  <3 RBC/hpf   Bacteria, UA MANY (*) RARE   Ct Abdomen Pelvis W Contrast  11/08/2012   *RADIOLOGY REPORT*  Clinical Data: Abdominal pain and emesis.  Nausea vomiting for 3 days.  History gastric outlet obstruction.  Cholecystectomy. Erosive esophagitis.  CT ABDOMEN AND PELVIS WITH CONTRAST  Technique:  Multidetector CT imaging of the abdomen and pelvis was performed following the standard protocol during bolus administration of intravenous contrast.  Contrast: 50mL OMNIPAQUE IOHEXOL 300 MG/ML  SOLN, OMNIPAQUE IOHEXOL 300 MG/ML  SOLN  Comparison: 10/30/2012  Findings: Lung bases:   Volume loss lingula and right middle lobe. Calcified granulomas in the  left lung base as well. Normal heart size without pericardial or pleural effusion.  There is minimal right-sided pleural thickening.  The distal esophagus is contrast filled and mildly dilated. Decreased esophageal wall thickening on image 1/series 2.  Abdomen/pelvis:  Borderline/minimal intrahepatic biliary ductal dilatation, unchanged.  Interval removal of the previously described retained surgical drain.  Normal spleen.  The stomach is moderately distended and contrast filled.  Unchanged.  There are is also dilatation of the descending duodenum.  There may be a area of relative underdistension within the pylorus/proximal duodenum (example image 96/series 2).  Development of a fluid collection within the cholecystectomy operative bed.  4.9 x 3.4 cm on image 25.  Common duct poorly visualized, but not dilated.  Normal adrenal glands.  Mild renal cortical thinning bilaterally. Advanced aortic atherosclerosis. No retroperitoneal or retrocrural adenopathy.  Moderate stool within the rectum and throughout the remainder of the colon. Normal terminal ileum.  Normal small bowel caliber, without ascites.  Favor minimal pneumobilia on image 22/series 2.   No pelvic adenopathy.  Mild urinary bladder distention.  Mild prostatomegaly. No significant free fluid.  Bones/Musculoskeletal:  No acute osseous  abnormality.  IMPRESSION:  1.  Removal of retained surgical drain since 10/30/2012.  Interval development of a fluid collection within the cholecystectomy bed. Question mild or biloma. 2.  Similar moderate gastric distention with the descending duodenal distention.  Cannot exclude an area of decreased caliber proximal duodenum, suggesting gastric outlet obstruction. 3.  Suspect constipation versus mild fecal impaction. 4.  Nonspecific urinary bladder distention. 5.  Improved distal esophageal wall thickening with contrast filled dilated esophagus, suggesting dysmotility or gastroesophageal reflux.   Original Report Authenticated  By: Jeronimo Greaves, M.D.    Review of Systems  Constitutional: Positive for chills, weight loss and malaise/fatigue.  HENT: Negative.   Eyes: Negative.   Respiratory: Negative.   Cardiovascular: Negative.   Gastrointestinal: Positive for heartburn, nausea, vomiting, abdominal pain (moderatee epigastric) and constipation. Negative for diarrhea, blood in stool and melena.  Genitourinary: Negative.   Musculoskeletal: Negative.   Skin: Negative.   Neurological: Positive for weakness.  Endo/Heme/Allergies: Negative.   Psychiatric/Behavioral: Positive for depression.    Blood pressure 132/68, pulse 107, temperature 98.3 F (36.8 C), temperature source Oral, resp. rate 18, height 6\' 3"  (1.905 m), weight 76.114 kg (167 lb 12.8 oz), SpO2 100.00%. Physical Exam  Constitutional: He is oriented to person, place, and time. No distress.  Cachetic.  Puny.  Dissheveled  HENT:  Head: Normocephalic and atraumatic.  Eyes: Conjunctivae and EOM are normal. Pupils are equal, round, and reactive to light.  Neck: Normal range of motion. Neck supple.  Cardiovascular: Normal rate, regular rhythm and normal heart sounds.   Respiratory: Effort normal and breath sounds normal.  GI: Soft. Bowel sounds are normal. He exhibits no distension and no mass. There is tenderness (mild to moderate.  Diffuse but mostly upper abdomen). There is no rebound and no guarding.  Incision well healed.  Lymphadenopathy:    He has no cervical adenopathy.  Neurological: He is alert and oriented to person, place, and time.  Flat affect  Skin: Skin is warm and dry.     Assessment/Plan Failure to thrive, Malnutrition.  Emesis.  Gastic out let obstruction.  Admit for hydration.  Sips and chips.  Protonix BID.  Continue Reglan.  COntinue anti-depression medications.  No acute surgical findings.  Will resume abx coverge.  Osmany Azer C 11/08/2012, 9:56 PM

## 2012-11-08 NOTE — Clinical Documentation Improvement (Signed)
To note patient has failure to thrive and associated malnutrition.

## 2012-11-09 ENCOUNTER — Encounter (HOSPITAL_COMMUNITY): Payer: Self-pay

## 2012-11-09 ENCOUNTER — Inpatient Hospital Stay (HOSPITAL_COMMUNITY): Payer: Medicare Other

## 2012-11-09 LAB — COMPREHENSIVE METABOLIC PANEL
ALT: 37 U/L (ref 0–53)
Alkaline Phosphatase: 307 U/L — ABNORMAL HIGH (ref 39–117)
BUN: 21 mg/dL (ref 6–23)
Creatinine, Ser: 1.13 mg/dL (ref 0.50–1.35)
GFR calc Af Amer: 70 mL/min — ABNORMAL LOW (ref >90)
GFR calc non Af Amer: 60 mL/min — ABNORMAL LOW (ref >90)
Glucose, Bld: 133 mg/dL — ABNORMAL HIGH (ref 70–99)
Potassium: 3.5 mEq/L (ref 3.5–5.1)
Total Protein: 5.4 g/dL — ABNORMAL LOW (ref 6.0–8.3)

## 2012-11-09 LAB — CBC
HCT: 27.4 % — ABNORMAL LOW (ref 39.0–52.0)
Hemoglobin: 8.9 g/dL — ABNORMAL LOW (ref 13.0–17.0)
MCH: 31 pg (ref 26.0–34.0)
MCHC: 32.5 g/dL (ref 30.0–36.0)
MCV: 95.5 fL (ref 78.0–100.0)
RDW: 15 % (ref 11.5–15.5)

## 2012-11-09 LAB — MAGNESIUM: Magnesium: 2.4 mg/dL (ref 1.5–2.5)

## 2012-11-09 MED ORDER — TECHNETIUM TC 99M MEBROFENIN IV KIT
5.0000 | PACK | Freq: Once | INTRAVENOUS | Status: AC | PRN
  Administered 2012-11-09: 5 via INTRAVENOUS

## 2012-11-09 MED ORDER — ADULT MULTIVITAMIN W/MINERALS CH
1.0000 | ORAL_TABLET | Freq: Every day | ORAL | Status: DC
  Administered 2012-11-09 – 2012-11-13 (×5): 1 via ORAL
  Filled 2012-11-09 (×5): qty 1

## 2012-11-09 NOTE — Progress Notes (Signed)
UR Chart Review Completed  

## 2012-11-09 NOTE — Progress Notes (Addendum)
Subjective: No significant change. Still with nausea. Appetite remains poor. Pain about the same  Objective: Vital signs in last 24 hours: Temp:  [97 F (36.1 C)-98.5 F (36.9 C)] 97.2 F (36.2 C) (06/27 1020) Pulse Rate:  [98-110] 99 (06/27 1020) Resp:  [18] 18 (06/27 1020) BP: (109-132)/(50-68) 115/58 mmHg (06/27 1020) SpO2:  [97 %-100 %] 98 % (06/27 1020) Last BM Date: 11/08/12  Intake/Output from previous day: 06/26 0701 - 06/27 0700 In: 662.5 [P.O.:100; I.V.:512.5; IV Piggyback:50] Out: -  Intake/Output this shift:    General appearance: no distress, slowed mentation and Cachectic and slightly disheveled appearance Resp: clear to auscultation bilaterally Cardio: regular rate and rhythm GI: Intermittent bowel sounds, soft, mild diffuse tenderness. No peritoneal signs.  Lab Results:   Recent Labs  11/08/12 0522 11/09/12 0534  WBC 15.4* 16.8*  HGB 11.9* 8.9*  HCT 35.0* 27.4*  PLT 555* 392   BMET  Recent Labs  11/08/12 0522 11/09/12 0534  NA 142 139  K 3.5 3.5  CL 98 103  CO2 35* 29  GLUCOSE 192* 133*  BUN 21 21  CREATININE 1.30 1.13  CALCIUM 9.2 8.0*   PT/INR No results found for this basename: LABPROT, INR,  in the last 72 hours ABG No results found for this basename: PHART, PCO2, PO2, HCO3,  in the last 72 hours  Studies/Results: Ct Abdomen Pelvis W Contrast  11/08/2012   *RADIOLOGY REPORT*  Clinical Data: Abdominal pain and emesis.  Nausea vomiting for 3 days.  History gastric outlet obstruction.  Cholecystectomy. Erosive esophagitis.  CT ABDOMEN AND PELVIS WITH CONTRAST  Technique:  Multidetector CT imaging of the abdomen and pelvis was performed following the standard protocol during bolus administration of intravenous contrast.  Contrast: 50mL OMNIPAQUE IOHEXOL 300 MG/ML  SOLN, OMNIPAQUE IOHEXOL 300 MG/ML  SOLN  Comparison: 10/30/2012  Findings: Lung bases:   Volume loss lingula and right middle lobe. Calcified granulomas in the left lung  base as well. Normal heart size without pericardial or pleural effusion.  There is minimal right-sided pleural thickening.  The distal esophagus is contrast filled and mildly dilated. Decreased esophageal wall thickening on image 1/series 2.  Abdomen/pelvis:  Borderline/minimal intrahepatic biliary ductal dilatation, unchanged.  Interval removal of the previously described retained surgical drain.  Normal spleen.  The stomach is moderately distended and contrast filled.  Unchanged.  There are is also dilatation of the descending duodenum.  There may be a area of relative underdistension within the pylorus/proximal duodenum (example image 96/series 2).  Development of a fluid collection within the cholecystectomy operative bed.  4.9 x 3.4 cm on image 25.  Common duct poorly visualized, but not dilated.  Normal adrenal glands.  Mild renal cortical thinning bilaterally. Advanced aortic atherosclerosis. No retroperitoneal or retrocrural adenopathy.  Moderate stool within the rectum and throughout the remainder of the colon. Normal terminal ileum.  Normal small bowel caliber, without ascites.  Favor minimal pneumobilia on image 22/series 2.   No pelvic adenopathy.  Mild urinary bladder distention.  Mild prostatomegaly. No significant free fluid.  Bones/Musculoskeletal:  No acute osseous abnormality.  IMPRESSION:  1.  Removal of retained surgical drain since 10/30/2012.  Interval development of a fluid collection within the cholecystectomy bed. Question mild or biloma. 2.  Similar moderate gastric distention with the descending duodenal distention.  Cannot exclude an area of decreased caliber proximal duodenum, suggesting gastric outlet obstruction. 3.  Suspect constipation versus mild fecal impaction. 4.  Nonspecific urinary bladder distention. 5.  Improved distal esophageal wall thickening with contrast filled dilated esophagus, suggesting dysmotility or gastroesophageal reflux.   Original Report Authenticated By: Jeronimo Greaves, M.D.    Anti-infectives: Anti-infectives   Start     Dose/Rate Route Frequency Ordered Stop   11/08/12 1400  piperacillin-tazobactam (ZOSYN) IVPB 3.375 g     3.375 g 12.5 mL/hr over 240 Minutes Intravenous 3 times per day 11/08/12 1011        Assessment/Plan: s/p * No surgery found * Nausea vomiting and abdominal pain. Leukocytosis is increased. Continue IV antibiotic coverage broad-spectrum antibiotics. Incision sites look okay. HIDA scan to evaluate fluid collection in the gallbladder fossa to rule out bile leak. Low suspicion of this. Urinalysis also suspicious for possible urinary tract infection contributing to his symptomatology. Patient's management complicated with his multiple medical and psychiatric issues. Malnutrition and failure to thrive remain high on my concerns. Continue twice daily Protonix 4 history of gastritis and suspicion for esophageal inflammatory changes on his most recent CT scan. Continue washout from below 4 evidence of constipation  LOS: 1 day    Myka Hitz C 11/09/2012   Please note: Patient was evaluated and noted to have severe malnutrition due to failure to thrive and limited by mouth intake.

## 2012-11-09 NOTE — Progress Notes (Signed)
INITIAL NUTRITION ASSESSMENT  DOCUMENTATION CODES Per approved criteria  -Severe malnutrition in the context of acute illness or injury   INTERVENTION:  Resource Breeze po TID, each supplement provides 250 kcal and 9 grams of protein.  Add MVI daily   NUTRITION DIAGNOSIS: Malnutrition (severe) related to inability to maintain adequate oral intake as evidenced by diet hx review and severe wt loss of 16#, 9%< 30 days.   Goal: Pt to meet >/= 90% of their estimated nutrition needs  Monitor:  Po intake, labs and wt trends  Reason for Assessment: Malnutrition Screen Score = 3  79 y.o. male  Admitting Dx: abdominal pain, nausea and vomiting  ASSESSMENT: Pt has worsening malnutrition related to inability to maintain nutrition needs via oral intake. He has had poor appetite, abdominal pain with nausea and vomiting. Additional unplanned wt loss of 7#,4% since assessed by RD on 6/18 and a total of 16#,9% for the month of June which is considered severe.  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 Denies abdominal pain or nausea at this time. He drinks mostly coffee at home and likes cereal.   Height: Ht Readings from Last 1 Encounters:  11/08/12 6\' 3"  (1.905 m)    Weight: Wt Readings from Last 1 Encounters:  11/08/12 167 lb 12.8 oz (76.114 kg)    Ideal Body Weight: 196# (89 kg)  % Ideal Body Weight: 86%  Wt Readings from Last 10 Encounters:  11/08/12 167 lb 12.8 oz (76.114 kg)  10/30/12 175 lb (79.379 kg)  10/30/12 175 lb (79.379 kg)  10/23/12 175 lb 0.7 oz (79.4 kg)  10/23/12 175 lb 0.7 oz (79.4 kg)  10/19/12 184 lb 15.5 oz (83.9 kg)  10/15/12 184 lb (83.462 kg)  10/01/12 184 lb 3.2 oz (83.553 kg)  07/24/12 180 lb (81.647 kg)  07/24/12 180 lb (81.647 kg)    Usual Body Weight: 185#  % Usual Body Weight: 91%  BMI:  Body mass index is 20.97 kg/(m^2).normal range  Estimated  Nutritional Needs: Kcal: 4782-9562 Protein: 99-114 gr Fluid: 2300 ml/day  Skin: No issues noted  Diet Order: General  EDUCATION NEEDS: -Education not appropriate at this time   Intake/Output Summary (Last 24 hours) at 11/09/12 1024 Last data filed at 11/08/12 1735  Gross per 24 hour  Intake  662.5 ml  Output      0 ml  Net  662.5 ml    Last BM: 11/08/12  Labs:   Recent Labs Lab 11/03/12 0539 11/08/12 0522 11/09/12 0534  NA 141 142 139  K 3.6 3.5 3.5  CL 104 98 103  CO2 32 35* 29  BUN 15 21 21   CREATININE 0.99 1.30 1.13  CALCIUM 8.5 9.2 8.0*  MG  --   --  2.4  PHOS  --   --  2.5  GLUCOSE 109* 192* 133*    CBG (last 3)  No results found for this basename: GLUCAP,  in the last 72 hours  Scheduled Meds: . clonazePAM  0.5 mg Oral TID  . enoxaparin (LOVENOX) injection  40 mg Subcutaneous Q24H  . feeding supplement  1 Container Oral TID BM  . mirtazapine  30 mg Oral QHS  . pantoprazole (PROTONIX) IV  40 mg Intravenous Q12H  . piperacillin-tazobactam (ZOSYN)  IV  3.375 g Intravenous Q8H  . QUEtiapine  100 mg Oral TID  . venlafaxine XR  75 mg Oral Daily  Continuous Infusions: . lactated ringers 75 mL/hr at 11/08/12 1045    Past Medical History  Diagnosis Date  . Depression   . Insomnia   . Anxiety   . Suicide attempt   . Erosive esophagitis 07/25/2012  . Gastric out let obstruction 07/25/2012  . HOH (hard of hearing)     Past Surgical History  Procedure Laterality Date  . Appendectomy    . Back surgery    . Ercp N/A 07/24/2012    Dr. Jena Gauss. Significant abnormalities of the bowl and proximal second portion producing partial gastric outlet stricture and with secondary gastric dilation and severe erosive reflux esophagitis. Biopsy showed benign ulceration. Normal-appearing ampulla, status post biliary sphincterotomy and ampulla balloon dilation, stone extraction and stent placement.  Marland Kitchen Sphincterotomy N/A 07/24/2012    Procedure: SPHINCTEROTOMY;   Surgeon: Corbin Ade, MD;  Location: [facility];  Service: Endoscopy;  Laterality: N/A;  . Esophageal biopsy N/A 07/24/2012    Procedure: BIOPSY;  Surgeon: Corbin Ade, MD;  Location: [facility];  Service: Endoscopy;  Laterality: N/A;  Duodenal and Esophageal Biopsies  . Esophagogastroduodenoscopy N/A 07/24/2012    ZOX:[id number] ampulla s/p biliary sphincterotomy and ampullary  . Balloon dilation N/A 07/24/2012    Procedure: BALLOON DILATION;  Surgeon: Corbin Ade, MD;  Location: [facility];  Service: Endoscopy;  Laterality: N/A;  Balloon Stone Extraction, Drudging  . Removal of stones N/A 07/24/2012    Procedure: REMOVAL OF STONES;  Surgeon: Corbin Ade, MD;  Location: [facility];  Service: Endoscopy;  Laterality: N/A;  . Egd/ercp  10/18/2012    Dr. Jena Gauss: ;yloric channel stenosis, s/p dilation and bx, persisting CBD stones with extension of sphincterotomy and sphincterotomy balloon dilation and stone extraction. Removal of biliary stent  . Esophagogastroduodenoscopy (egd) with propofol N/A 10/18/2012    Procedure: ESOPHAGOGASTRODUODENOSCOPY (EGD) WITH PROPOFOL;  Surgeon: Corbin Ade, MD;  Location: [facility];  Service: Endoscopy;  Laterality: N/A;  . Ercp N/A 10/18/2012    Procedure: ENDOSCOPIC RETROGRADE CHOLANGIOPANCREATOGRAPHY (ERCP);  Surgeon: Corbin Ade, MD;  Location: [facility];  Service: Endoscopy;  Laterality: N/A;  . Removal of stones N/A 10/18/2012    Procedure: REMOVAL OF STONES;  Surgeon: Corbin Ade, MD;  Location: [facility];  Service: Endoscopy;  Laterality: N/A;  . Esophageal biopsy N/A 10/18/2012    Procedure: BIOPSY;  Surgeon: Corbin Ade, MD;  Location: [facility];  Service: Endoscopy;  Laterality: N/A;  . Cholecystectomy N/A 10/26/2012    Procedure: LAPAROSCOPIC CHOLECYSTECTOMY;  Surgeon: Dalia Heading, MD;  Location: [facility];  Service: General;  Laterality: N/A;  . Laparoscopy N/A 10/31/2012    Procedure: LAPAROSCOPY DIAGNOSTIC;  Surgeon: Fabio Bering, MD;  Location: [facility];   Service: General;  Laterality: N/A;    Royann Shivers MS,RD,LDN,CSG Office: [phone] Pager: [phone]

## 2012-11-09 NOTE — Clinical Social Work Note (Addendum)
CSW presented bed offers to pt's daughter-in-law. Edward Trevino chooses bed at [facility]. Pt also reluctantly agreeable to this. Facility notified of 30 day pasarr and probable weekend d/c per MD. Edward Trevino to complete paperwork this afternoon at [facility].   Edward Trevino, [place] [phone]

## 2012-11-09 NOTE — Clinical Social Work Placement (Signed)
Clinical Social Work Department CLINICAL SOCIAL WORK PLACEMENT NOTE 11/09/2012  Patient:  TAJAH, SCHREINER  Account Number:  [id number] Admit date:  11/08/2012  Clinical Social Worker:  [name], LCSW  Date/time:  11/08/2012 01:40 PM  Clinical Social Work is seeking post-discharge placement for this patient at the following level of care:   SKILLED NURSING   (*CSW will update this form in Epic as items are completed)   11/08/2012  Patient/family provided with [facility] Department of Clinical Social Work's list of facilities offering this level of care within the geographic area requested by the patient (or if unable, by the patient's family).  11/08/2012  Patient/family informed of their freedom to choose among providers that offer the needed level of care, that participate in Medicare, Medicaid or managed care program needed by the patient, have an available bed and are willing to accept the patient.  11/08/2012  Patient/family informed of [facility]' ownership interest in [facility], as well as of the fact that they are under no obligation to receive care at this facility.  PASARR submitted to EDS on 11/08/2012 PASARR number received from EDS on 11/09/2012  FL2 transmitted to all facilities in geographic area requested by pt/family on  11/08/2012 FL2 transmitted to all facilities within larger geographic area on   Patient informed that his/her managed care company has contracts with or will negotiate with  certain facilities, including the following:     Patient/family informed of bed offers received:  11/09/2012 Patient chooses bed at [facility] Physician recommends and patient chooses bed at    Patient to be transferred to  on   Patient to be transferred to facility by   The following physician request were entered in Epic:   Additional Comments: 30 day pasarr   [name], LCSW [phone]

## 2012-11-10 MED ORDER — PANTOPRAZOLE SODIUM 40 MG PO TBEC
40.0000 mg | DELAYED_RELEASE_TABLET | Freq: Two times a day (BID) | ORAL | Status: DC
  Administered 2012-11-10 – 2012-11-13 (×6): 40 mg via ORAL
  Filled 2012-11-10 (×6): qty 1

## 2012-11-10 MED ORDER — MEGESTROL ACETATE 40 MG PO TABS
40.0000 mg | ORAL_TABLET | Freq: Every day | ORAL | Status: DC
  Administered 2012-11-10 – 2012-11-13 (×4): 40 mg via ORAL
  Filled 2012-11-10 (×5): qty 1

## 2012-11-10 MED ORDER — MEGESTROL ACETATE 40 MG PO TABS
ORAL_TABLET | ORAL | Status: AC
  Filled 2012-11-10: qty 1

## 2012-11-10 NOTE — Progress Notes (Signed)
  Subjective: NO increase in pain.  Appetite still poor.    Objective: Vital signs in last 24 hours: Temp:  [98.3 F (36.8 C)-99.9 F (37.7 C)] 99.9 F (37.7 C) (06/28 2142) Pulse Rate:  [80-98] 95 (06/28 2142) Resp:  [18-20] 20 (06/28 2142) BP: (107-123)/(58-67) 112/60 mmHg (06/28 2142) SpO2:  [97 %-100 %] 98 % (06/28 2142) Last BM Date: 11/10/12  Intake/Output from previous day: 06/27 0701 - 06/28 0700 In: 3013.8 [I.V.:2763.8; IV Piggyback:250] Out: -  Intake/Output this shift:    General appearance: alert and no distress GI: soft, non-tender; bowel sounds normal; no masses,  no organomegaly  Lab Results:   Recent Labs  11/08/12 0522 11/09/12 0534  WBC 15.4* 16.8*  HGB 11.9* 8.9*  HCT 35.0* 27.4*  PLT 555* 392   BMET  Recent Labs  11/08/12 0522 11/09/12 0534  NA 142 139  K 3.5 3.5  CL 98 103  CO2 35* 29  GLUCOSE 192* 133*  BUN 21 21  CREATININE 1.30 1.13  CALCIUM 9.2 8.0*   PT/INR No results found for this basename: LABPROT, INR,  in the last 72 hours ABG No results found for this basename: PHART, PCO2, PO2, HCO3,  in the last 72 hours  Studies/Results: Nm Hepatobiliary Liver Func  11/09/2012   *RADIOLOGY REPORT*  Clinical Data:  79 year old male status post cholecystectomy. Gallbladder fossa fluid collection.  NUCLEAR MEDICINE HEPATOBILIARY IMAGING  Technique:  Sequential images of the abdomen were obtained out to 60 minutes following intravenous administration of radiopharmaceutical.  Radiopharmaceutical:  5.52mCi Tc-58m Choletec  Comparison:  CT abdomen and pelvis 11/08/2012 and earlier.  Findings: Prompt radiotracer uptake by the liver and clearance of the blood pool.  Small bowel activity is evident early by 10-15 minutes.  Abundant small bowel activity occurs over the course of the study which includes delayed images out to 90 minutes.  16- minute and 90-minute lateral images also were obtained.  There is no free leakage of radiotracer into the  peritoneal cavity. There is a non mobile round focus of activity projecting along the central lower liver margin throughout the study.  (Arrow) Correlation with the yesterday's coronal and sagittal reformatted CT images suggest a similar location of the fluid collection in question.  IMPRESSION: No bile leak into the peritoneal cavity.  However, cannot exclude the possibility of a contained biloma in the gallbladder fossa.   Original Report Authenticated By: Erskine Speed, M.D.    Anti-infectives: Anti-infectives   Start     Dose/Rate Route Frequency Ordered Stop   11/08/12 1400  piperacillin-tazobactam (ZOSYN) IVPB 3.375 g     3.375 g 12.5 mL/hr over 240 Minutes Intravenous 3 times per day 11/08/12 1011        Assessment/Plan: s/p * No surgery found * Failure fo thrive, malnutrition, pain. Contine diet as tolerated.  Will state megace.  ? SNF tomorrow.    LOS: 2 days    Kyana Aicher C 11/10/2012

## 2012-11-10 NOTE — Progress Notes (Signed)
The patient is receiving Protonix by the intravenous route.  Based on criteria approved by the Pharmacy and Therapeutics Committee and the Medical Executive Committee, the medication is being converted to the equivalent oral dose form.  These criteria include: -No Active GI bleeding -Able to tolerate diet of full liquids (or better) or tube feeding OR able to tolerate other medications by the oral or enteral route  If you have any questions about this conversion, please contact the Pharmacy Department (ext [phone]).  Thank you.  Wayland Denis, [facility] 11/10/2012 9:53 AM

## 2012-11-11 ENCOUNTER — Inpatient Hospital Stay (HOSPITAL_COMMUNITY): Payer: Medicare Other

## 2012-11-11 LAB — CBC
Hemoglobin: 8.6 g/dL — ABNORMAL LOW (ref 13.0–17.0)
MCH: 32 pg (ref 26.0–34.0)
MCHC: 34.1 g/dL (ref 30.0–36.0)
Platelets: 370 10*3/uL (ref 150–400)
RBC: 2.69 MIL/uL — ABNORMAL LOW (ref 4.22–5.81)
RDW: 14.3 % (ref 11.5–15.5)

## 2012-11-11 LAB — BASIC METABOLIC PANEL
BUN: 11 mg/dL (ref 6–23)
Creatinine, Ser: 1.15 mg/dL (ref 0.50–1.35)
GFR calc Af Amer: 68 mL/min — ABNORMAL LOW (ref >90)
Glucose, Bld: 110 mg/dL — ABNORMAL HIGH (ref 70–99)
Sodium: 138 mEq/L (ref 135–145)

## 2012-11-11 NOTE — Progress Notes (Signed)
  Subjective: Still feels about the same. Denies any significant complaints of pain. Says he feels down and depressed. When asked more about this he states he feels about the same. I questioned him regarding his appetite wasn't this was in conjunction with his poor appetite failure to thrive. Upon discussing plans for continued management patient states he does not wish to go home. He expresses some hesitancy of being discharged to a home environment and is agreeable to rehabilitation although with continued discussion he states he didn't feel it really matters. When asked regarding any self-harm intentions he states" I'm not sure, maybe, I don't think I can deal with all this".    Objective: Vital signs in last 24 hours: Temp:  [98.3 F (36.8 C)-99.9 F (37.7 C)] 98.4 F (36.9 C) (06/29 0459) Pulse Rate:  [80-95] 90 (06/29 0459) Resp:  [20] 20 (06/29 0459) BP: (107-123)/(60-67) 122/63 mmHg (06/29 0459) SpO2:  [97 %-100 %] 99 % (06/29 0459) Last BM Date: 11/11/12  Intake/Output from previous day: 06/28 0701 - 06/29 0700 In: 2137.5 [P.O.:220; I.V.:1767.5; IV Piggyback:150] Out: -  Intake/Output this shift:    General appearance: no distress and Cachectic, flat affect Resp: clear to auscultation bilaterally Cardio: regular rate and rhythm GI: soft, non-tender; bowel sounds normal; no masses,  no organomegaly  Lab Results:   Recent Labs  11/09/12 0534 11/11/12 1030  WBC 16.8* 9.9  HGB 8.9* 8.6*  HCT 27.4* 25.2*  PLT 392 370   BMET  Recent Labs  11/09/12 0534 11/11/12 1030  NA 139 138  K 3.5 3.6  CL 103 102  CO2 29 28  GLUCOSE 133* 110*  BUN 21 11  CREATININE 1.13 1.15  CALCIUM 8.0* 8.4   PT/INR No results found for this basename: LABPROT, INR,  in the last 72 hours ABG No results found for this basename: PHART, PCO2, PO2, HCO3,  in the last 72 hours  Studies/Results: No results found.  Anti-infectives: Anti-infectives   Start     Dose/Rate Route Frequency  Ordered Stop   11/08/12 1400  piperacillin-tazobactam (ZOSYN) IVPB 3.375 g     3.375 g 12.5 mL/hr over 240 Minutes Intravenous 3 times per day 11/08/12 1011        Assessment/Plan: s/p * No surgery found * Abdominal issues appear to be improving. Patient still with failure to thrive and malnutrition. No improvement with Megace regarding appetite. He is not taking his supplementation either. I had a long discussion with the patient regarding his course and rehabilitation options again. Additionally given patient's history of suicidal attempts in the past I do have a level of concern regarding his comments made during our discussion and interaction. At this point suicide precautions will be implemented. I do feel tele psych may be again required.  Given his course I do not feel patient is appropriate for discharge as he has an extremely high readmission potential  LOS: 3 days    Atianna Haidar C 11/11/2012

## 2012-11-12 ENCOUNTER — Encounter: Payer: Self-pay | Admitting: Internal Medicine

## 2012-11-12 LAB — CBC
HCT: 24.9 % — ABNORMAL LOW (ref 39.0–52.0)
Hemoglobin: 8.4 g/dL — ABNORMAL LOW (ref 13.0–17.0)
MCH: 31.5 pg (ref 26.0–34.0)
MCHC: 33.7 g/dL (ref 30.0–36.0)
MCV: 93.3 fL (ref 78.0–100.0)
RDW: 14.3 % (ref 11.5–15.5)

## 2012-11-12 NOTE — Progress Notes (Signed)
  Subjective: Tired. Appetite poor.  Objective: Vital signs in last 24 hours: Temp:  [98.2 F (36.8 C)-99.1 F (37.3 C)] 99.1 F (37.3 C) (06/30 0500) Pulse Rate:  [85-86] 85 (06/30 0500) Resp:  [20] 20 (06/30 0500) BP: (114-119)/(59-77) 114/59 mmHg (06/30 0500) SpO2:  [98 %-100 %] 100 % (06/30 0500) Last BM Date: 11/11/12  Intake/Output from previous day: 06/29 0701 - 06/30 0700 In: 1421.3 [P.O.:120; I.V.:1151.3; IV Piggyback:150] Out: 600 [Urine:600] Intake/Output this shift:    General appearance: no distress GI: soft, non-tender; bowel sounds normal; no masses,  no organomegaly  Lab Results:   Recent Labs  11/11/12 1030 11/12/12 0525  WBC 9.9 10.4  HGB 8.6* 8.4*  HCT 25.2* 24.9*  PLT 370 368   BMET  Recent Labs  11/11/12 1030  NA 138  K 3.6  CL 102  CO2 28  GLUCOSE 110*  BUN 11  CREATININE 1.15  CALCIUM 8.4   PT/INR No results found for this basename: LABPROT, INR,  in the last 72 hours ABG No results found for this basename: PHART, PCO2, PO2, HCO3,  in the last 72 hours  Studies/Results: Dg Chest 1 View  11/11/2012   *RADIOLOGY REPORT*  Clinical Data: Shortness of breath, weakness  CHEST - 1 VIEW  Comparison: Prior radiograph from 07/22/2012.  Findings: The cardiac and mediastinal silhouettes are stable in size and contour, and remain within normal limits.  The lungs are well inflated.  Diffuse prominence of the interstitial markings is similar to prior exam.  Biapical pleural scarring is again noted.  No focal consolidation is present. Irregular linear opacities present within both lung bases most likely represents atelectasis.  No acute osseous abnormality identified.  Cholecystectomy clips are noted in the right upper quadrant.  IMPRESSION:  Bibasilar atelectasis.  No focal airspace consolidation.   Original Report Authenticated By: Rise Mu, M.D.    Anti-infectives: Anti-infectives   Start     Dose/Rate Route Frequency Ordered Stop    11/08/12 1400  piperacillin-tazobactam (ZOSYN) IVPB 3.375 g     3.375 g 12.5 mL/hr over 240 Minutes Intravenous 3 times per day 11/08/12 1011        Assessment/Plan: s/p * No surgery found * Failure to thrive. Malnutrition. Depression. Concerns for potential suicidal intentions. ACT team to evaluate. Possible placement. Will need rehabilitation regardless of psychiatric findings. Medically stable. Continue Megace for appetite stimulation  LOS: 4 days    Vinton Layson C 11/12/2012

## 2012-11-12 NOTE — Clinical Social Work Note (Signed)
CSw reviewed chart, spoke w ACT team assessor.  Appears that ACT team will seek placement for patient at geropsych unit, CSW assisted w paperwork.  Patient has bed offers at [facility], [place], [place].  CSW will defer to ACT team decision on appropriate placement at geropsych unit prior to SNF.  [place]era, LCSW Clinical Social Worker [phone])

## 2012-11-12 NOTE — Clinical Social Work Placement (Signed)
    Clinical Social Work Department CLINICAL SOCIAL WORK PLACEMENT NOTE 11/12/2012  Patient:  Edward Trevino, Edward Trevino  Account Number:  [id number] Admit date:  11/08/2012  Clinical Social Worker:  Sherrlyn Hock  Date/time:  11/08/2012 01:40 PM  Clinical Social Work is seeking post-discharge placement for this patient at the following level of care:   SKILLED NURSING   (*CSW will update this form in Epic as items are completed)   11/08/2012  Patient/family provided with [facility] Department of Clinical Social Work's list of facilities offering this level of care within the geographic area requested by the patient (or if unable, by the patient's family).  11/08/2012  Patient/family informed of their freedom to choose among providers that offer the needed level of care, that participate in Medicare, Medicaid or managed care program needed by the patient, have an available bed and are willing to accept the patient.  11/08/2012  Patient/family informed of [facility]' ownership interest in [facility], as well as of the fact that they are under no obligation to receive care at this facility.  PASARR submitted to EDS on 11/08/2012 PASARR number received from EDS on 11/09/2012  FL2 transmitted to all facilities in geographic area requested by pt/family on  11/08/2012 FL2 transmitted to all facilities within larger geographic area on   Patient informed that his/her managed care company has contracts with or will negotiate with  certain facilities, including the following:     Patient/family informed of bed offers received:  11/09/2012 Patient chooses bed at [facility] Physician recommends and patient chooses bed at    Patient to be transferred to  on   Patient to be transferred to facility by   The following physician request were entered in Epic:   Additional Comments: 30 day pasarr ACt team referring to geropsych placement at [facility] 11/12/12.  SNF  informed that placement may be delayed.  Santa Genera, LCSW Clinical Social Worker [phone])

## 2012-11-12 NOTE — Progress Notes (Signed)
Spoke with Santina Evans of admissions at [facility] and she stated that they had 2 admissions and and a gentleman in the ER.  Those patients would take priority to the patient here.  She stated that if the patient in the ED was not admitted that the patient could possibly be transferred tonight, otherwise they have discharges lined up for tomorrow and that he could come then.  This was reported to the oncoming nurse.

## 2012-11-12 NOTE — Progress Notes (Signed)
Called [facility] to inquire if there was any information regarding if there was available at the site or not.  She stated at the current time she only knows of two females that have beds there and that she would have to check the office for the referral.  I voiced to her that I had spoke with Tommy from the ACT team and he asked me to call and verify.

## 2012-11-12 NOTE — BH Assessment (Signed)
Assessment Note   Edward Trevino is an 79 y.o. male. The patient has been living on his own until recently. Due to health changes he has been hospitalized at least twice. When discharged last, he was at home, but was not eating , one day he drank 1 cup of coffee. He is failure to thrive. He talks about not being able to rest. He reports that he feels tired all the time. He is sad and blue but does not cry. He is not psychotic. When asked about suicidal thoughts, he denies them. He has also said "I'm not sure,maybe, I don't think I can deal with this. He also states he thinks the next day will be better, but it never is. He lives alone, but does not want to return to his home. His wife died some time in the past, he still misses her.He is very calm and cooperative.  He has no history of treatment. He has no history of violence. Discussed the case with Dr . Dian Situ, and explained that the patient is very depressed and is expressing suicidal ideations. It is felt that inpatient treatment is indicated. The patient is referred to [facility] for consideration.  Axis I: Major Depression, Recurrent severe Axis II: Deferred Axis III:  Past Medical History  Diagnosis Date  . Depression   . Insomnia   . Anxiety   . Suicide attempt   . Erosive esophagitis 07/25/2012  . Gastric out let obstruction 07/25/2012  . HOH (hard of hearing)    Axis IV: other psychosocial or environmental problems and problems with primary support group Axis V: 31-40 impairment in reality testing  Past Medical History:  Past Medical History  Diagnosis Date  . Depression   . Insomnia   . Anxiety   . Suicide attempt   . Erosive esophagitis 07/25/2012  . Gastric out let obstruction 07/25/2012  . HOH (hard of hearing)     Past Surgical History  Procedure Laterality Date  . Appendectomy    . Back surgery    . Ercp N/A 07/24/2012    Dr. Jena Gauss. Significant abnormalities of the bowl and proximal second portion producing partial  gastric outlet stricture and with secondary gastric dilation and severe erosive reflux esophagitis. Biopsy showed benign ulceration. Normal-appearing ampulla, status post biliary sphincterotomy and ampulla balloon dilation, stone extraction and stent placement.  Marland Kitchen Sphincterotomy N/A 07/24/2012    Procedure: SPHINCTEROTOMY;  Surgeon: Corbin Ade, MD;  Location: [facility];  Service: Endoscopy;  Laterality: N/A;  . Esophageal biopsy N/A 07/24/2012    Procedure: BIOPSY;  Surgeon: Corbin Ade, MD;  Location: [facility];  Service: Endoscopy;  Laterality: N/A;  Duodenal and Esophageal Biopsies  . Esophagogastroduodenoscopy N/A 07/24/2012    AOZ:[id number] ampulla s/p biliary sphincterotomy and ampullary  . Balloon dilation N/A 07/24/2012    Procedure: BALLOON DILATION;  Surgeon: Corbin Ade, MD;  Location: [facility];  Service: Endoscopy;  Laterality: N/A;  Balloon Stone Extraction, Drudging  . Removal of stones N/A 07/24/2012    Procedure: REMOVAL OF STONES;  Surgeon: Corbin Ade, MD;  Location: [facility];  Service: Endoscopy;  Laterality: N/A;  . Egd/ercp  10/18/2012    Dr. Jena Gauss: ;yloric channel stenosis, s/p dilation and bx, persisting CBD stones with extension of sphincterotomy and sphincterotomy balloon dilation and stone extraction. Removal of biliary stent  . Esophagogastroduodenoscopy (egd) with propofol N/A 10/18/2012    Procedure: ESOPHAGOGASTRODUODENOSCOPY (EGD) WITH PROPOFOL;  Surgeon: Corbin Ade, MD;  Location: [facility];  Service: Endoscopy;  Laterality: N/A;  . Ercp N/A 10/18/2012    Procedure: ENDOSCOPIC RETROGRADE CHOLANGIOPANCREATOGRAPHY (ERCP);  Surgeon: Corbin Ade, MD;  Location: [facility];  Service: Endoscopy;  Laterality: N/A;  . Removal of stones N/A 10/18/2012    Procedure: REMOVAL OF STONES;  Surgeon: Corbin Ade, MD;  Location: [facility];  Service: Endoscopy;  Laterality: N/A;  . Esophageal biopsy N/A 10/18/2012    Procedure: BIOPSY;  Surgeon: Corbin Ade, MD;  Location: [facility];   Service: Endoscopy;  Laterality: N/A;  . Cholecystectomy N/A 10/26/2012    Procedure: LAPAROSCOPIC CHOLECYSTECTOMY;  Surgeon: Dalia Heading, MD;  Location: [facility];  Service: General;  Laterality: N/A;  . Laparoscopy N/A 10/31/2012    Procedure: LAPAROSCOPY DIAGNOSTIC;  Surgeon: Fabio Bering, MD;  Location: [facility];  Service: General;  Laterality: N/A;    Family History:  Family History  Problem Relation Age of Onset  . Colon cancer Neg Hx     Social History:  reports that he has been smoking Cigars.  He does not have any smokeless tobacco history on file. He reports that he does not drink alcohol or use illicit drugs.  Additional Social History:     CIWA: CIWA-Ar BP: 114/59 mmHg Pulse Rate: 85 COWS:    Allergies: No Known Allergies  Home Medications:  Medications Prior to Admission  Medication Sig Dispense Refill  . clonazePAM (KLONOPIN) 0.5 MG tablet Take 1 tablet (0.5 mg total) by mouth 3 (three) times daily. Takes one tablet twice daily and 2 tablets at bedtime.  90 tablet  0  . feeding supplement (RESOURCE BREEZE) LIQD Take 1 Container by mouth 3 (three) times daily between meals.  20 Container  2  . HYDROcodone-acetaminophen (NORCO) 5-325 MG per tablet Take 1 tablet by mouth every 6 (six) hours as needed for pain.  40 tablet  0  . mirtazapine (REMERON) 30 MG tablet Take 1 tablet (30 mg total) by mouth at bedtime.  30 tablet  0  . Multiple Vitamin (MULTIVITAMIN WITH MINERALS) TABS Take 1 tablet by mouth daily.      . QUEtiapine (SEROQUEL) 100 MG tablet Take 1 tablet (100 mg total) by mouth 3 (three) times daily.  90 tablet  0  . venlafaxine XR (EFFEXOR-XR) 75 MG 24 hr capsule Take 1 capsule (75 mg total) by mouth daily.  30 capsule  0    OB/GYN Status:  No LMP for male patient.  General Assessment Data Location of Assessment: [facility] ([address]) ACT Assessment: Yes Living Arrangements: Alone Can pt return to current living arrangement?: No Admission Status:  Voluntary Is patient capable of signing voluntary admission?: Yes Transfer from: Acute Hospital Referral Source: Medical Floor Inpatient  Education Status Is patient currently in school?: No  Risk to self Suicidal Ideation: Yes-Currently Present Suicidal Intent: No-Not Currently/Within Last 6 Months Is patient at risk for suicide?: Yes Suicidal Plan?: No Access to Means: No What has been your use of drugs/alcohol within the last 12 months?: none Previous Attempts/Gestures: No Other Self Harm Risks: not eating well Triggers for Past Attempts: None known Intentional Self Injurious Behavior: None Family Suicide History: No Recent stressful life event(s): Loss (Comment);Recent negative physical changes (wife deceased;) Persecutory voices/beliefs?: No Depression: Yes Depression Symptoms: Despondent;Fatigue;Loss of interest in usual pleasures Substance abuse history and/or treatment for substance abuse?: No Suicide prevention information given to non-admitted patients: Not applicable  Risk to Others Homicidal Ideation: No Thoughts of Harm to Others: No Current  Homicidal Intent: No Current Homicidal Plan: No Access to Homicidal Means: No History of harm to others?: No Assessment of Violence: None Noted Does patient have access to weapons?: No Criminal Charges Pending?: No Does patient have a court date: No  Psychosis Hallucinations: None noted Delusions: None noted  Mental Status Report Appear/Hygiene: Improved Eye Contact: Poor Motor Activity: Psychomotor retardation Speech: Slow;Logical/coherent Level of Consciousness: Alert Mood: Depressed;Sad;Despair Affect: Depressed;Sad Anxiety Level: None Thought Processes: Coherent Judgement: Unimpaired Orientation: Place;Time;Person Obsessive Compulsive Thoughts/Behaviors: Moderate  Cognitive Functioning Concentration: Decreased Memory: Recent Impaired;Remote Impaired IQ: Average Insight: Fair Impulse Control:  Poor Appetite: Poor Sleep: No Change Vegetative Symptoms: Staying in bed;Decreased grooming  ADLScreening [facility]) Patient's cognitive ability adequate to safely complete daily activities?: Yes Patient able to express need for assistance with ADLs?: Yes Independently performs ADLs?: No  Abuse/Neglect [facility]) Physical Abuse: Denies Verbal Abuse: Denies Sexual Abuse: Denies  Prior Inpatient Therapy Prior Inpatient Therapy: No  Prior Outpatient Therapy Prior Outpatient Therapy: No  ADL Screening (condition at time of admission) Patient's cognitive ability adequate to safely complete daily activities?: Yes Patient able to express need for assistance with ADLs?: Yes Independently performs ADLs?: No Communication: Independent Dressing (OT): Needs assistance Is this a change from baseline?: Change from baseline, expected to last <3days Grooming: Needs assistance Is this a change from baseline?: Change from baseline, expected to last <3 days Feeding: Independent Bathing: Needs assistance Is this a change from baseline?: Change from baseline, expected to last <3 days Toileting: Needs assistance Is this a change from baseline?: Change from baseline, expected to last <3 days In/Out Bed: Needs assistance Is this a change from baseline?: Change from baseline, expected to last <3 days Walks in Home: Independent Weakness of Legs: Both Weakness of Arms/Hands: Both  Home Assistive Devices/Equipment Home Assistive Devices/Equipment: Eyeglasses;Dentures (specify type)  Therapy Consults (therapy consults require a physician order) PT Evaluation Needed: No OT Evalulation Needed: No SLP Evaluation Needed: No Abuse/Neglect Assessment (Assessment to be complete while patient is alone) Physical Abuse: Denies Verbal Abuse: Denies Sexual Abuse: Denies Exploitation of patient/patient's resources: Denies Self-Neglect: Denies Values / Beliefs Cultural Requests During  Hospitalization: None Spiritual Requests During Hospitalization: None Consults Spiritual Care Consult Needed: No Social Work Consult Needed: No Merchant navy officer (For Healthcare) Advance Directive: Patient does not have advance directive Pre-existing out of facility DNR order (yellow form or pink MOST form): No Nutrition Screen- [facility] Adult/[facility] Patient's home diet: Regular Have you recently lost weight without trying?: Yes If yes, how much weight have you lost?: Patient is unsure Have you been eating poorly because of a decreased appetite?: Yes Malnutrition Screening Tool Score: 3  Additional Information 1:1 In Past 12 Months?: No CIRT Risk: No Elopement Risk: No Does patient have medical clearance?: Yes     Disposition:  Disposition Initial Assessment Completed for this Encounter: Yes Disposition of Patient: Inpatient treatment program;Referred to Type of inpatient treatment program: Adult Patient referred to: Other (Comment) [facility])  On Site Evaluation by:   Reviewed with Physician:     Jake Shark [facility] 11/12/2012 10:51 AM

## 2012-11-13 ENCOUNTER — Ambulatory Visit: Payer: Medicare Other | Admitting: Gastroenterology

## 2012-11-13 ENCOUNTER — Telehealth: Payer: Self-pay | Admitting: Gastroenterology

## 2012-11-13 LAB — CBC
HCT: 25.5 % — ABNORMAL LOW (ref 39.0–52.0)
MCH: 31.2 pg (ref 26.0–34.0)
MCHC: 33.7 g/dL (ref 30.0–36.0)
MCV: 92.4 fL (ref 78.0–100.0)
Platelets: 331 10*3/uL (ref 150–400)
RDW: 14 % (ref 11.5–15.5)

## 2012-11-13 MED ORDER — CLONAZEPAM 0.5 MG PO TABS: 0.5000 mg | ORAL_TABLET | Freq: Three times a day (TID) | ORAL | Status: DC

## 2012-11-13 MED ORDER — HYDROCODONE-ACETAMINOPHEN 5-325 MG PO TABS: 1.0000 | ORAL_TABLET | Freq: Four times a day (QID) | ORAL | Status: DC | PRN

## 2012-11-13 MED ORDER — MEGESTROL ACETATE 40 MG PO TABS: 40.0000 mg | ORAL_TABLET | Freq: Every day | ORAL | Status: DC

## 2012-11-13 NOTE — Discharge Summary (Signed)
Physician Discharge Summary  Patient ID: Edward Trevino MRN: [id number] DOB/AGE: 77-Apr-1933 77 y.o.  Admit date: 11/08/2012 Discharge date: 11/13/2012  Admission Diagnoses:  Abdominal pain, failure to thrive, malnutrition.  Discharge Diagnoses: The same.  Depression.  Suicidal thoughts.   Active Problems:   * No active hospital problems. *   Discharged Condition: fair  Hospital Course: Patient returned to ED with increased abdominal pain.  Nonspecific.  Not eating.  Admitted for management.  Treated for suspected UTI and failure to thrive.  Patient placed on ABX and megace.  Continued to have poor po intake.  During his course suggested suicidal thoughts and ACT consult was obtained.  Patient will need continued rehab from medical/physical standpoint and out patient phychiatric monitoring.   Consults: ACT team  Significant Diagnostic Studies: labs: daily and radiology: CT scan: abdomen and pelvis.  HIDA Scan  Treatments: IV hydration and antibiotics: Zosyn  Discharge Exam: Blood pressure 112/89, pulse 83, temperature 98.2 F (36.8 C), temperature source Oral, resp. rate 20, height 6\' 3"  (1.905 m), weight 76.114 kg (167 lb 12.8 oz), SpO2 98.00%. General appearance: no distress and flat affect.  [place] Resp: clear to auscultation bilaterally Cardio: regular rate and rhythm GI: soft, non-tender; bowel sounds normal; no masses,  no organomegaly  Disposition: 06-Home-Health Care Svc  Discharge Orders   Future Orders Complete By Expires     Diet - low sodium heart healthy  As directed     Discharge instructions  As directed     Comments:      Increase activity as tolerated.    Discharge wound care:  As directed     Comments:      Clean surgical sites with soap and water.  May shower the morning after surgery unless instructed by Dr. Leticia Penna otherwise.  No soaking for 2-3 weeks.    If adhesive strips are in place, they may be removed in 1-2 weeks while in the shower.    Increase  activity slowly  As directed         Medication List         clonazePAM 0.5 MG tablet  Commonly known as:  KLONOPIN  Take 1 tablet (0.5 mg total) by mouth 3 (three) times daily. Takes one tablet twice daily and 2 tablets at bedtime.     feeding supplement Liqd  Take 1 Container by mouth 3 (three) times daily between meals.     HYDROcodone-acetaminophen 5-325 MG per tablet  Commonly known as:  NORCO  Take 1 tablet by mouth every 6 (six) hours as needed for pain.     mirtazapine 30 MG tablet  Commonly known as:  REMERON  Take 1 tablet (30 mg total) by mouth at bedtime.     multivitamin with minerals Tabs  Take 1 tablet by mouth daily.     QUEtiapine 100 MG tablet  Commonly known as:  SEROQUEL  Take 1 tablet (100 mg total) by mouth 3 (three) times daily.     venlafaxine XR 75 MG 24 hr capsule  Commonly known as:  EFFEXOR-XR  Take 1 capsule (75 mg total) by mouth daily.         SignedFabio Bering 11/13/2012, 12:44 PM

## 2012-11-13 NOTE — Clinical Social Work Placement (Signed)
Clinical Social Work Department CLINICAL SOCIAL WORK PLACEMENT NOTE 11/13/2012  Patient:  Edward Trevino, Edward Trevino  Account Number:  [id number] Admit date:  11/08/2012  Clinical Social Worker:  [name], LCSW  Date/time:  11/08/2012 01:40 PM  Clinical Social Work is seeking post-discharge placement for this patient at the following level of care:   SKILLED NURSING   (*CSW will update this form in Epic as items are completed)   11/08/2012  Patient/family provided with [facility] Department of Clinical Social Work's list of facilities offering this level of care within the geographic area requested by the patient (or if unable, by the patient's family).  11/08/2012  Patient/family informed of their freedom to choose among providers that offer the needed level of care, that participate in Medicare, Medicaid or managed care program needed by the patient, have an available bed and are willing to accept the patient.  11/08/2012  Patient/family informed of [facility]' ownership interest in [facility], as well as of the fact that they are under no obligation to receive care at this facility.  PASARR submitted to EDS on 11/08/2012 PASARR number received from EDS on 11/09/2012  FL2 transmitted to all facilities in geographic area requested by pt/family on  11/08/2012 FL2 transmitted to all facilities within larger geographic area on   Patient informed that his/her managed care company has contracts with or will negotiate with  certain facilities, including the following:     Patient/family informed of bed offers received:  11/09/2012 Patient chooses bed at [facility] Physician recommends and patient chooses bed at    Patient to be transferred to [facility] on  11/13/2012 Patient to be transferred to facility by family arranging  The following physician request were entered in Epic:   Additional Comments: 30 day pasarr  [name],  LCSW [phone]

## 2012-11-13 NOTE — Clinical Social Work Note (Addendum)
Per RN, family requesting that pt go to SNF as opposed to psych placement. CSW spoke with Tommy, ACT, who reports pt was not actively suicidal and was going voluntarily. He feels d/c to SNF with outpatient follow up would also be appropriate. CSW spoke with pt as well. He denies any suicidal ideation and said it has been several years since he did. CSW notified MD who agrees with d/c to SNF. Outpatient followup appointment set for Thursday, July 3 at [facility] at 8:00 am. [facility] notified of all of above information and are agreeable to accept pt. Family relieved pt can go to SNF from hospital. Scripts in packet. Family deciding whether to transport pt themselves or arrange [facility]. They will notify RN of transport decision. D/C summary faxed.  [name], [place] [phone]

## 2012-11-13 NOTE — Telephone Encounter (Signed)
Pt was a no show

## 2012-11-13 NOTE — Progress Notes (Signed)
Called report to Dustin Flock, RN at [facility] in [place], [place].  Verbalized understanding.  Pt to be dc'd to facility when family arrives to transport.  [place], [place]el[place] Stagers 11/13/2012

## 2012-11-13 NOTE — Progress Notes (Signed)
Family here to transport patient to [facility]. Patient dc'd to facility with family.  Sch[place]witz, Candelaria Stagers 11/13/2012

## 2012-11-13 NOTE — Care Management Note (Signed)
    Page 1 of 1   11/13/2012     12:25:33 PM   CARE MANAGEMENT NOTE 11/13/2012  Patient:  ARGUS, CARAHER   Account Number:  [id number]  Date Initiated:  11/13/2012  Documentation initiated by:  Rosemary Holms  Subjective/Objective Assessment:   Mr Blaisdell admitted from home. Was being referred to [facility] but no bed available. DCing to [facility].     Action/Plan:   Anticipated DC Date:  11/13/2012   Anticipated DC Plan:  SKILLED NURSING FACILITY      DC Planning Services  CM consult      Choice offered to / List presented to:             Status of service:  Completed, signed off Medicare Important Message given?  YES (If response is "NO", the following Medicare IM given date fields will be blank) Date Medicare IM given:  11/13/2012 Date Additional Medicare IM given:    Discharge Disposition:  SKILLED NURSING FACILITY  Per UR Regulation:    If discussed at Long Length of Stay Meetings, dates discussed:   11/13/2012    Comments:  11/13/12 12:20 Jerral Mccauley Leanord Hawking RN BSN CM Pt going to SNF.

## 2012-11-19 NOTE — Telephone Encounter (Signed)
Please send note for follow-up   

## 2012-11-20 ENCOUNTER — Encounter: Payer: Self-pay | Admitting: Gastroenterology

## 2012-11-20 NOTE — Telephone Encounter (Signed)
Mailed letter for pt to call our office to [facility]

## 2012-12-18 ENCOUNTER — Emergency Department (HOSPITAL_COMMUNITY): Payer: Medicare Other

## 2012-12-18 ENCOUNTER — Encounter (HOSPITAL_COMMUNITY): Payer: Self-pay | Admitting: Emergency Medicine

## 2012-12-18 ENCOUNTER — Encounter (HOSPITAL_COMMUNITY): Payer: Self-pay

## 2012-12-18 ENCOUNTER — Emergency Department (HOSPITAL_COMMUNITY)
Admission: EM | Admit: 2012-12-18 | Discharge: 2012-12-18 | Disposition: A | Discharge status: Other Acute Inpatient Hospital | Payer: Medicare Other | Source: Home / Self Care | Attending: Emergency Medicine | Admitting: Emergency Medicine

## 2012-12-18 ENCOUNTER — Inpatient Hospital Stay (HOSPITAL_COMMUNITY)
Admission: AD | Admit: 2012-12-18 | Discharge: 2012-12-28 | DRG: 885 | Disposition: A | Discharge status: Home / Self Care | Payer: Medicare Other | Source: Intra-hospital | Attending: Emergency Medicine | Admitting: Emergency Medicine

## 2012-12-18 DIAGNOSIS — Z9889 Other specified postprocedural states: Secondary | ICD-10-CM | POA: Insufficient documentation

## 2012-12-18 DIAGNOSIS — Z79899 Other long term (current) drug therapy: Secondary | ICD-10-CM

## 2012-12-18 DIAGNOSIS — R45851 Suicidal ideations: Secondary | ICD-10-CM

## 2012-12-18 DIAGNOSIS — F411 Generalized anxiety disorder: Secondary | ICD-10-CM | POA: Diagnosis present

## 2012-12-18 DIAGNOSIS — F4323 Adjustment disorder with mixed anxiety and depressed mood: Secondary | ICD-10-CM | POA: Diagnosis present

## 2012-12-18 DIAGNOSIS — F418 Other specified anxiety disorders: Secondary | ICD-10-CM

## 2012-12-18 DIAGNOSIS — Z9089 Acquired absence of other organs: Secondary | ICD-10-CM | POA: Insufficient documentation

## 2012-12-18 DIAGNOSIS — F329 Major depressive disorder, single episode, unspecified: Secondary | ICD-10-CM | POA: Insufficient documentation

## 2012-12-18 DIAGNOSIS — F17200 Nicotine dependence, unspecified, uncomplicated: Secondary | ICD-10-CM | POA: Insufficient documentation

## 2012-12-18 DIAGNOSIS — F32A Depression, unspecified: Secondary | ICD-10-CM

## 2012-12-18 DIAGNOSIS — I959 Hypotension, unspecified: Secondary | ICD-10-CM

## 2012-12-18 DIAGNOSIS — E440 Moderate protein-calorie malnutrition: Secondary | ICD-10-CM

## 2012-12-18 DIAGNOSIS — E860 Dehydration: Secondary | ICD-10-CM | POA: Diagnosis present

## 2012-12-18 DIAGNOSIS — R109 Unspecified abdominal pain: Secondary | ICD-10-CM

## 2012-12-18 DIAGNOSIS — R3915 Urgency of urination: Secondary | ICD-10-CM

## 2012-12-18 DIAGNOSIS — F3289 Other specified depressive episodes: Secondary | ICD-10-CM | POA: Insufficient documentation

## 2012-12-18 DIAGNOSIS — G4700 Insomnia, unspecified: Secondary | ICD-10-CM | POA: Diagnosis present

## 2012-12-18 DIAGNOSIS — F5105 Insomnia due to other mental disorder: Secondary | ICD-10-CM

## 2012-12-18 DIAGNOSIS — F332 Major depressive disorder, recurrent severe without psychotic features: Principal | ICD-10-CM | POA: Diagnosis present

## 2012-12-18 DIAGNOSIS — Z8669 Personal history of other diseases of the nervous system and sense organs: Secondary | ICD-10-CM | POA: Insufficient documentation

## 2012-12-18 DIAGNOSIS — X838XXA Intentional self-harm by other specified means, initial encounter: Secondary | ICD-10-CM

## 2012-12-18 DIAGNOSIS — Z8719 Personal history of other diseases of the digestive system: Secondary | ICD-10-CM | POA: Insufficient documentation

## 2012-12-18 DIAGNOSIS — F322 Major depressive disorder, single episode, severe without psychotic features: Secondary | ICD-10-CM

## 2012-12-18 LAB — COMPREHENSIVE METABOLIC PANEL
ALT: 31 U/L (ref 0–53)
Albumin: 3.5 g/dL (ref 3.5–5.2)
Alkaline Phosphatase: 149 U/L — ABNORMAL HIGH (ref 39–117)
CO2: 26 mEq/L (ref 19–32)
GFR calc Af Amer: 68 mL/min — ABNORMAL LOW (ref >90)
Potassium: 3.9 mEq/L (ref 3.5–5.1)
Sodium: 138 mEq/L (ref 135–145)
Total Bilirubin: 0.3 mg/dL (ref 0.3–1.2)
Total Protein: 7 g/dL (ref 6.0–8.3)

## 2012-12-18 LAB — ACETAMINOPHEN LEVEL: Acetaminophen (Tylenol), Serum: <15 ug/mL (ref 10–30)

## 2012-12-18 LAB — RAPID URINE DRUG SCREEN, HOSP PERFORMED
Barbiturates: NOT DETECTED
Benzodiazepines: NOT DETECTED
Cocaine: NOT DETECTED
Tetrahydrocannabinol: NOT DETECTED

## 2012-12-18 LAB — CBC
HCT: 35.5 % — ABNORMAL LOW (ref 39.0–52.0)
MCHC: 34.9 g/dL (ref 30.0–36.0)
Platelets: 292 10*3/uL (ref 150–400)
RBC: 3.86 MIL/uL — ABNORMAL LOW (ref 4.22–5.81)
RDW: 14.2 % (ref 11.5–15.5)
WBC: 6.9 10*3/uL (ref 4.0–10.5)

## 2012-12-18 LAB — SALICYLATE LEVEL: Salicylate Lvl: <2 mg/dL — ABNORMAL LOW (ref 2.8–20.0)

## 2012-12-18 MED ORDER — DIPHENHYDRAMINE HCL 25 MG PO CAPS
25.0000 mg | ORAL_CAPSULE | Freq: Every evening | ORAL | Status: DC | PRN
  Administered 2012-12-18 – 2012-12-27 (×9): 25 mg via ORAL
  Filled 2012-12-18: qty 1

## 2012-12-18 MED ORDER — MAGNESIUM HYDROXIDE 400 MG/5ML PO SUSP: 30.0000 mL | Freq: Every day | ORAL | Status: DC | PRN

## 2012-12-18 MED ORDER — ALUM & MAG HYDROXIDE-SIMETH 200-200-20 MG/5ML PO SUSP: 30.0000 mL | ORAL | Status: DC | PRN

## 2012-12-18 MED ORDER — ACETAMINOPHEN 325 MG PO TABS: 650.0000 mg | ORAL_TABLET | Freq: Four times a day (QID) | ORAL | Status: DC | PRN
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
## 2012-12-18 NOTE — Tx Team (Signed)
Initial Interdisciplinary Treatment Plan  PATIENT STRENGTHS: (choose at least two) Ability for insight Active sense of humor Average or above average intelligence Capable of independent living Communication skills Financial means General fund of knowledge Motivation for treatment/growth Physical Health Religious Affiliation Supportive family/friends  PATIENT STRESSORS: Loss of pt widowed   PROBLEM LIST: Problem List/Patient Goals Date to be addressed Date deferred Reason deferred Estimated date of resolution  Depression 12/18/12     Suicide Risk 12/18/12                                                DISCHARGE CRITERIA:  Adequate post-discharge living arrangements Improved stabilization in mood, thinking, and/or behavior Motivation to continue treatment in a less acute level of care Need for constant or close observation no longer present Reduction of life-threatening or endangering symptoms to within safe limits Safe-care adequate arrangements made Verbal commitment to aftercare and medication compliance  PRELIMINARY DISCHARGE PLAN: Outpatient therapy Return to previous living arrangement  PATIENT/FAMIILY INVOLVEMENT: This treatment plan has been presented to and reviewed with the patient, Edward Trevino, and/or family member.  The patient and family have been given the opportunity to ask questions and make suggestions.  [name] 12/18/2012, 9:05 PM
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
## 2012-12-18 NOTE — ED Notes (Signed)
Pt reports being depressed for the last 2 weeks, having suicidal thoughts, denies any plan.  Denies any hi, is followed by [facility]

## 2012-12-18 NOTE — ED Notes (Signed)
[facility] arrival.  Report given.

## 2012-12-18 NOTE — Progress Notes (Signed)
Adult Psychoeducational Group Note  Date:  12/18/2012 Time:  10:08 PM  Group Topic/Focus:  Wrap-Up Group:   The focus of this group is to help patients review their daily goal of treatment and discuss progress on daily workbooks.  Participation Level:  Active  Participation Quality:  Monopolizing  Affect:  Blunted and Flat  Cognitive:  Disorganized and Confused  Insight: None  Engagement in Group:  Monopolizing  Modes of Intervention:  Limit-setting and Orientation  Additional Comments:  Pt was a new admit, elderly and hard of hearing, kept interrupting other patients while they were talking to ask questions about medications.   Humberto Seals Monique 12/18/2012, 10:08 PM

## 2012-12-18 NOTE — ED Notes (Signed)
Report called to Rayfield Citizen, RN at [facility]

## 2012-12-18 NOTE — ED Notes (Signed)
States that he is feeling short of breath.
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
## 2012-12-19 DIAGNOSIS — X838XXA Intentional self-harm by other specified means, initial encounter: Secondary | ICD-10-CM

## 2012-12-19 DIAGNOSIS — F332 Major depressive disorder, recurrent severe without psychotic features: Principal | ICD-10-CM

## 2012-12-19 DIAGNOSIS — F418 Other specified anxiety disorders: Secondary | ICD-10-CM | POA: Diagnosis present

## 2012-12-19 DIAGNOSIS — F411 Generalized anxiety disorder: Secondary | ICD-10-CM

## 2012-12-19 DIAGNOSIS — F4323 Adjustment disorder with mixed anxiety and depressed mood: Secondary | ICD-10-CM | POA: Diagnosis present

## 2012-12-19 MED ORDER — ENSURE COMPLETE PO LIQD
237.0000 mL | Freq: Two times a day (BID) | ORAL | Status: DC
  Administered 2012-12-19 – 2012-12-21 (×5): 237 mL via ORAL

## 2012-12-19 MED ORDER — PANTOPRAZOLE SODIUM 20 MG PO TBEC
20.0000 mg | DELAYED_RELEASE_TABLET | Freq: Two times a day (BID) | ORAL | Status: DC
  Administered 2012-12-19 – 2012-12-28 (×19): 20 mg via ORAL
  Filled 2012-12-19 (×24): qty 1

## 2012-12-19 MED ORDER — ADULT MULTIVITAMIN W/MINERALS CH
1.0000 | ORAL_TABLET | Freq: Every day | ORAL | Status: DC
  Administered 2012-12-19 – 2012-12-28 (×10): 1 via ORAL
  Filled 2012-12-19 (×13): qty 1

## 2012-12-19 MED ORDER — ESCITALOPRAM OXALATE 10 MG PO TABS
10.0000 mg | ORAL_TABLET | Freq: Every day | ORAL | Status: DC
  Administered 2012-12-19 – 2012-12-28 (×10): 10 mg via ORAL
  Filled 2012-12-19 (×13): qty 1
  Filled 2012-12-19: qty 3

## 2012-12-19 NOTE — Tx Team (Signed)
Interdisciplinary Treatment Plan Update   Date Reviewed:  12/19/2012  Time Reviewed:  9:24 AM  Progress in Treatment:   Attending groups: Yes Participating in groups: Yes Taking medication as prescribed: Yes  Tolerating medication: Yes Family/Significant other contact made:No, but will ask patient for consent for collateral contact Patient understands diagnosis: Yes  Discussing patient identified problems/goals with staff: Yes Medical problems stabilized or resolved: Yes Denies suicidal/homicidal ideation: Yes Patient has not harmed self or others: Yes  For review of initial/current patient goals, please see plan of care.  Estimated Length of Stay:  3-4 days  Reasons for Continued Hospitalization:  Anxiety Depression Medication stabilization   New Problems/Goals identified:    Discharge Plan or Barriers:   Home with outpatient follow up to be determined  Additional Comments: N/A  Attendees:  Patient:  12/19/2012 9:24 AM   Signature: Mervyn Gay, MD 12/19/2012 9:24 AM  Signature:  Verne Spurr, PA 12/19/2012 9:24 AM  Signature: Harold Barban, RN 12/19/2012 9:24 AM  Signature: Leighton Parody, RN 12/19/2012 9:24 AM  Signature:  Baldo Ash RN 12/19/2012 9:24 AM  Signature:  Juline Patch, LCSW 12/19/2012 9:24 AM  Signature:  Reyes Ivan, LCSW 12/19/2012 9:24 AM  Signature:  12/19/2012 9:24 AM  Signature: 12/19/2012 9:24 AM  Signature:    Signature:    Signature:      Scribe for Treatment Team:   Juline Patch,  12/19/2012 9:24 AM

## 2012-12-19 NOTE — [facility] Group Notes (Signed)
[facility] LCSW Aftercare Discharge Planning Group Note   12/19/2012 10:22 AM  Participation Quality:  Appropraite  Mood/Affect:  Appropriate  Depression Rating:  1  Anxiety Rating:  1  Thoughts of Suicide:  No  Will you contract for safety?   NA  Current AVH:  No  Plan for Discharge/Comments:  Patient attending discharge planning group and actively participated in group.  Patient to be assessed individually for discharge needs.  CSW provided all participants with daily workbook and information on services offered by [facility].   Transportation Means: Patient has transportation.   Supports:  Patient has limited system.   Payten Beaumier, [name]

## 2012-12-19 NOTE — Progress Notes (Signed)
Recreation Therapy Notes  Date: 08.06.2014 Time: 3:00pm Location: [address] Dayroom  Group Topic: Communication, Team Building, Problem Solving  Goal Area(s) Addresses:  Patient will effectively work in groups to accomplish shared goal. Patient will verbalize skills used in to make activity successful. Patient will verbalize benefit of using skills in a healthy way. Patient will relate skills used during group session to personal development.   Behavioral Response: Disengaged  Intervention: Team Activity  Activity: Landing Pad. Patients were divided into two groups. Patients were given 12 drinking straws and a length of masking tape. Using supplies provided patients were asked to build a freestanding structure to catch a plastic golf ball dropped from approximately 4 feet.   Education: Customer service manager, Discharge Planning, Relapse Prevention  Education Outcome: Acknowledges Understanding.   Clinical Observations/Feedback: Patient attended group session, but made no contributions. Patient was observed to sit near group, drinking a cup of coffee, patient made no effort to engage with group members. During wrap up discussion patient moved around room, ultimately settling near the phone. After patient sat down he looked through the adult handbook.    Marykay Lex Millette Halberstam, LRT/CTRS  Jearl Klinefelter 12/19/2012 4:36 PM

## 2012-12-19 NOTE — Progress Notes (Signed)
NUTRITION ASSESSMENT  Pt identified as at risk on the Malnutrition Screen Tool  INTERVENTION: 1. Educated patient on the importance of nutrition and encouraged intake of food and beverages. 2. Discussed weight goals. 3. Supplements: Ensure Complete po BID, each supplement provides 350 kcal and 13 grams of protein.  4.  MVI daily  NUTRITION DIAGNOSIS: Predicted sub optimal intake related to decreased appetite and early satiety AEB patient report. Goal: Pt to meet >/= 90% of their estimated nutrition needs.  Monitor:  PO intake  Assessment:  Patient admitted with depression and insomnia.  "OK appetite.  Not a bit eater.  Ate all I could.  Does not know UBW but does not think that he has lost weight.  Temples very decreased.  Walks with a walker.  Appears frail.  79 y.o. male  Height: Ht Readings from Last 1 Encounters:  12/18/12 6\' 3"  (1.905 m)    Weight: Wt Readings from Last 1 Encounters:  12/18/12 175 lb 14.8 oz (79.8 kg)    Weight Hx: Wt Readings from Last 10 Encounters:  12/18/12 175 lb 14.8 oz (79.8 kg)  12/18/12 180 lb (81.647 kg)  11/08/12 167 lb 12.8 oz (76.114 kg)  10/30/12 175 lb (79.379 kg)  10/30/12 175 lb (79.379 kg)  10/23/12 175 lb 0.7 oz (79.4 kg)  10/23/12 175 lb 0.7 oz (79.4 kg)  10/19/12 184 lb 15.5 oz (83.9 kg)  10/15/12 184 lb (83.462 kg)  10/01/12 184 lb 3.2 oz (83.553 kg)    BMI:  Body mass index is 21.99 kg/(m^2). Pt meets criteria for wnl based on current BMI.  Estimated Nutritional Needs: Kcal: 25-30 kcal/kg Protein: > 1 gram protein/kg Fluid: 1 ml/kcal  Diet Order: General Pt is also offered choice of unit snacks mid-morning and mid-afternoon.  Pt is eating as desired.   Lab results and medications reviewed.   Oran Rein, RD, LDN Clinical Inpatient Dietitian Pager:  [phone] Weekend and after hours pager:  [phone]

## 2012-12-19 NOTE — Progress Notes (Signed)
D:Patient in bed on approach.  Patient stated, "I'm going to bed early.  I'm an old man I need my rest."  Patient states he is feeling ok.  Patient states he went to the groups today.  When writer asked patient uf he had been using his walker patient stated, "Yes I have been using it, I think I'm addicted to it.  Patient denies SI/HI and denies AVH. A: Staff to monitor Q 15 mins for safety.  Encouragement and support offered. No scheduled medications administered per orders. R: Patient remains safe on the unit.  Patient did not attend group tonight.  Patient isolated in his room tonight and he did not come out.  No medications administered tonight.

## 2012-12-19 NOTE — Progress Notes (Signed)
Psychoeducational Group Note  Date:  12/19/2012 Time:  1100  Group Topic/Focus:  Personal Choices and Values:   The focus of this group is to help patients assess and explore the importance of values in their lives, how their values affect their decisions, how they express their values and what opposes their expression.  Participation Level: Did Not Attend  Participation Quality:  Not Applicable  Affect:  Not Applicable  Cognitive:  Not Applicable  Insight:  Not Applicable  Engagement in Group: Not Applicable  Additional Comments:  Joshuah did not attend, patient remained in bed.  Karleen Hampshire Brittini 12/19/2012, 6:38 PM

## 2012-12-19 NOTE — Progress Notes (Signed)
Psychoeducational Group Note  Date:  12/19/2012 Time:  2000  Group Topic/Focus:  Wrap-Up Group:   The focus of this group is to help patients review their daily goal of treatment and discuss progress on daily workbooks.  Participation Level: Did Not Attend  Participation Quality:  Not Applicable  Affect:  Not Applicable  Cognitive:  Not Applicable  Insight:  Not Applicable  Engagement in Group: Not Applicable  Additional Comments:    Flonnie Hailstone 12/19/2012, 9:45 PM

## 2012-12-19 NOTE — [facility] Suicide Risk Assessment (Signed)
Suicide Risk Assessment  Admission Assessment     Nursing information obtained from:    Demographic factors:    Current Mental Status:    Loss Factors:    Historical Factors:    Risk Reduction Factors:     CLINICAL FACTORS:   Depression:   Anhedonia Hopelessness Insomnia Recent sense of peace/wellbeing Severe  COGNITIVE FEATURES THAT CONTRIBUTE TO RISK:  Closed-mindedness Loss of executive function Polarized thinking    SUICIDE RISK:   Mild:  Suicidal ideation of limited frequency, intensity, duration, and specificity.  There are no identifiable plans, no associated intent, mild dysphoria and related symptoms, good self-control (both objective and subjective assessment), few other risk factors, and identifiable protective factors, including available and accessible social support.  PLAN OF CARE: Admit voluntarily, emergently from [facility] ER for depression and suicidal ideations.   I certify that inpatient services furnished can reasonably be expected to improve the patient's condition.   Nehemiah Settle., MD 12/19/2012, 11:58 AM

## 2012-12-19 NOTE — [facility] Group Notes (Signed)
[facility] LCSW Group Therapy  Emotional Regulation 1:15 - 2: 30 PM        12/19/2012  12:53 PM   Type of Therapy:  Group Therapy  Participation Level:  Minimal  Participation Quality:  Appropriate  Affect:  Appropriate  Cognitive: Appropriate  Insight:  Developing/Improving  Engagement in Therapy:  Developing/Improving  Modes of Intervention:  Discussion Exploration Problem-Solving Supportive  Summary of Progress/Problems:  Group topic was emotional regulations.  Patient was focused on having made his son his power of attorney and feeling son not handling matters correctly.  Patient was informed  he needs to talk with family and see an attorney on how to handle the situation.  Wynn Banker 12/19/2012 12:53 PM

## 2012-12-19 NOTE — H&P (Signed)
Psychiatric Admission Assessment Adult  Patient Identification:  [name] Date of Evaluation:  12/19/2012 Chief Complaint:  MOOD DISORDER NOS History of Present Illness: Edward Trevino is an 79 y.o. White male a day to [facility] voluntarily, emergently from  [facility] for increased symptoms of depression and suicidal ideation. Patient reported he was taken to the emergency department by his family members because he has increased depression, sadness, loss of interest, disturbance of sleep and appetite. Patient reported he started feeling isolated, withdrawn hopeless, helpless, fatigue, tearful, and boredom at home. Reportedly he has stated that "life is not worth living anymore, they come and go". patient stated that he has a bad days than good days and the last 2 weeks are more depressive.  Patient has a history of a few psychiatric hospitalizations in [facility] in 2011 for depression and suicide ideation. Patient has a history of suicide attempt, he stated "I tried a long time ago with the shot gun in the neck".  Patient Stated That he smokes cigars but no other drugs of abuse including alcoholism. Patient patient has been forgetful lately he does not remember when he lost his father, wife and daughter. Reportedly patient was served [employer] about 24 yrs  in 5 different stations but no combativeness he did experience, and truck driving about 20 years.  Reportedly staph from the [place] can come and visit him at home but he does not want to go to [facility] because of negative thoughts about his service.  Patient Has  5 sons, 2 of which "live on my property and  reportedly they're busy with their own work schedule.   Elements:  Location:  [facility] adult unit. Quality:  depression. Severity:  suicidal idations. Timing:  feeling alone and scared. Duration:  two weeks. Context:  depression, anxiety and suicidal thoughts and has hx of suicidal attempt. . Associated Signs/Synptoms: Depression  Symptoms:  depressed mood, anhedonia, insomnia, psychomotor retardation, fatigue, feelings of worthlessness/guilt, difficulty concentrating, hopelessness, impaired memory, recurrent thoughts of death, anxiety, decreased labido, decreased appetite, (Hypo) Manic Symptoms:  none Anxiety Symptoms:  Excessive Worry, Psychotic Symptoms:  none PTSD Symptoms: NA  Psychiatric Specialty Exam: Physical Exam  ROS  Blood pressure 108/67, pulse 79, temperature 98.9 F (37.2 C), temperature source Oral, resp. rate 16, height 6\' 3"  (1.905 m), weight 79.8 kg (175 lb 14.8 oz).Body mass index is 21.99 kg/(m^2).  General Appearance: Disheveled and Guarded  Eye Solicitor::  Fair  Speech:  Blocked and Slow  Volume:  Decreased  Mood:  Anxious, Depressed, Hopeless and Worthless  Affect:  Congruent, Depressed, Flat and Restricted  Thought Process:  Coherent and Linear  Orientation:  Full (Time, Place, and Person)  Thought Content:  Rumination  Suicidal Thoughts:  Yes.  without intent/plan  Homicidal Thoughts:  No  Memory:  Immediate;   Fair  Judgement:  Intact  Insight:  Present  Psychomotor Activity:  Psychomotor Retardation and Wide Base  Concentration:  Poor  Recall:  Poor  Akathisia:  NA  Handed:  Right  AIMS (if indicated):     Assets:  Communication Skills Desire for Improvement Leisure Time Physical Health Resilience Social Support  Sleep:  Number of Hours: 5.75    Past Psychiatric History: Diagnosis:  Hospitalizations:  Outpatient Care:  Substance Abuse Care:  Self-Mutilation:  Suicidal Attempts:  Violent Behaviors:   Past Medical History:   Past Medical History  Diagnosis Date  . Depression   . Insomnia   . Anxiety   .  Suicide attempt   . Erosive esophagitis 07/25/2012  . Gastric out let obstruction 07/25/2012  . HOH (hard of hearing)    None. Allergies:  No Known Allergies PTA Medications: Prescriptions prior to admission  Medication Sig Dispense Refill  .  clonazePAM (KLONOPIN) 0.5 MG tablet Take 1 tablet (0.5 mg total) by mouth 3 (three) times daily. Takes one tablet twice daily and 2 tablets at bedtime.  90 tablet  0  . ENSURE PLUS (ENSURE PLUS) LIQD Take 237 mLs by mouth 2 (two) times daily between meals.      . feeding supplement (RESOURCE BREEZE) LIQD Take 1 Container by mouth 3 (three) times daily between meals.  20 Container  2  . megestrol (MEGACE) 40 MG tablet Take 1 tablet (40 mg total) by mouth daily.  30 tablet  1  . Multiple Vitamin (MULTIVITAMIN WITH MINERALS) TABS Take 1 tablet by mouth daily.      Marland Kitchen omeprazole (PRILOSEC) 20 MG capsule Take 20 mg by mouth 2 (two) times daily.      . QUEtiapine (SEROQUEL) 100 MG tablet Take 1 tablet (100 mg total) by mouth 3 (three) times daily.  90 tablet  0  . venlafaxine XR (EFFEXOR-XR) 75 MG 24 hr capsule Take 1 capsule (75 mg total) by mouth daily.  30 capsule  0    Previous Psychotropic Medications:  Medication/Dose  Seroquel   Remeron   Klonopin   Effexor   Prilosec   Multivitamins   Clonazepam    Substance Abuse History in the last 12 months:  no  Consequences of Substance Abuse: NA  Social History:  reports that he has been smoking Cigars.  He has never used smokeless tobacco. He reports that he does not drink alcohol or use illicit drugs. Additional Social History: History of alcohol / drug use?: No history of alcohol / drug abuse                    Current Place of Residence:   Place of Birth:   Family Members: Marital Status:  Widowed Children:  Sons:  Daughters: Relationships: Education:  GED Educational Problems/Performance: Religious Beliefs/Practices: History of Abuse (Emotional/Phsycial/Sexual) Teacher, music History:  Data processing manager History: Hobbies/Interests:  Family History:   Family History  Problem Relation Age of Onset  . Colon cancer Neg Hx     Results for orders placed during the hospital encounter of 12/18/12 (from  the past 72 hour(s))  ACETAMINOPHEN LEVEL     Status: None   Collection Time    12/18/12 11:03 AM      Result Value Range   Acetaminophen (Tylenol), Serum <15.0  10 - 30 ug/mL   Comment:            THERAPEUTIC CONCENTRATIONS VARY     SIGNIFICANTLY. A RANGE OF 10-30     ug/mL MAY BE AN EFFECTIVE     CONCENTRATION FOR MANY PATIENTS.     HOWEVER, SOME ARE BEST TREATED     AT CONCENTRATIONS OUTSIDE THIS     RANGE.     ACETAMINOPHEN CONCENTRATIONS     >150 ug/mL AT 4 HOURS AFTER     INGESTION AND >50 ug/mL AT 12     HOURS AFTER INGESTION ARE     OFTEN ASSOCIATED WITH TOXIC     REACTIONS.  CBC     Status: Abnormal   Collection Time    12/18/12 11:03 AM      Result Value Range  WBC 6.9  4.0 - 10.5 K/uL   RBC 3.86 (*) 4.22 - 5.81 MIL/uL   Hemoglobin 12.4 (*) 13.0 - 17.0 g/dL   HCT 16.1 (*) 09.6 - 04.5 %   MCV 92.0  78.0 - 100.0 fL   MCH 32.1  26.0 - 34.0 pg   MCHC 34.9  30.0 - 36.0 g/dL   RDW 40.9  81.1 - 91.4 %   Platelets 292  150 - 400 K/uL  COMPREHENSIVE METABOLIC PANEL     Status: Abnormal   Collection Time    12/18/12 11:03 AM      Result Value Range   Sodium 138  135 - 145 mEq/L   Potassium 3.9  3.5 - 5.1 mEq/L   Chloride 103  96 - 112 mEq/L   CO2 26  19 - 32 mEq/L   Glucose, Bld 100 (*) 70 - 99 mg/dL   BUN 15  6 - 23 mg/dL   Creatinine, Ser 7.82  0.50 - 1.35 mg/dL   Calcium 9.3  8.4 - 95.6 mg/dL   Total Protein 7.0  6.0 - 8.3 g/dL   Albumin 3.5  3.5 - 5.2 g/dL   AST 29  0 - 37 U/L   ALT 31  0 - 53 U/L   Alkaline Phosphatase 149 (*) 39 - 117 U/L   Total Bilirubin 0.3  0.3 - 1.2 mg/dL   GFR calc non Af Amer 59 (*) >90 mL/min   GFR calc Af Amer 68 (*) >90 mL/min   Comment:            The eGFR has been calculated     using the CKD EPI equation.     This calculation has not been     validated in all clinical     situations.     eGFR's persistently     <90 mL/min signify     possible Chronic Kidney Disease.  ETHANOL     Status: None   Collection Time     12/18/12 11:03 AM      Result Value Range   Alcohol, Ethyl (B) <11  0 - 11 mg/dL   Comment:            LOWEST DETECTABLE LIMIT FOR     SERUM ALCOHOL IS 11 mg/dL     FOR MEDICAL PURPOSES ONLY  SALICYLATE LEVEL     Status: Abnormal   Collection Time    12/18/12 11:03 AM      Result Value Range   Salicylate Lvl <2.0 (*) 2.8 - 20.0 mg/dL  URINE RAPID DRUG SCREEN (HOSP PERFORMED)     Status: None   Collection Time    12/18/12 12:46 PM      Result Value Range   Opiates NONE DETECTED  NONE DETECTED   Cocaine NONE DETECTED  NONE DETECTED   Benzodiazepines NONE DETECTED  NONE DETECTED   Amphetamines NONE DETECTED  NONE DETECTED   Tetrahydrocannabinol NONE DETECTED  NONE DETECTED   Barbiturates NONE DETECTED  NONE DETECTED   Comment:            DRUG SCREEN FOR MEDICAL PURPOSES     ONLY.  IF CONFIRMATION IS NEEDED     FOR ANY PURPOSE, NOTIFY LAB     WITHIN 5 DAYS.                LOWEST DETECTABLE LIMITS     FOR URINE DRUG SCREEN     Drug Class  Cutoff (ng/mL)     Amphetamine      1000     Barbiturate      200     Benzodiazepine   200     Tricyclics       300     Opiates          300     Cocaine          300     THC              50   Psychological Evaluations:  Assessment:   AXIS I:  Anxiety Disorder NOS and Major Depression, Recurrent severe AXIS II:  Deferred AXIS III:   Past Medical History  Diagnosis Date  . Depression   . Insomnia   . Anxiety   . Suicide attempt   . Erosive esophagitis 07/25/2012  . Gastric out let obstruction 07/25/2012  . HOH (hard of hearing)    AXIS IV:  other psychosocial or environmental problems, problems related to social environment and problems with primary support group AXIS V:  41-50 serious symptoms  Treatment Plan/Recommendations:   Admit voluntarily, emergently for severe symptoms of depression, anxiety and suicidal ideation. Patient needed crisis stabilization, safety monitoring and medication management.  Treatment Plan  Summary: Daily contact with patient to assess and evaluate symptoms and progress in treatment Medication management Current Medications:  Current Facility-Administered Medications  Medication Dose Route Frequency Provider Last Rate Last Dose  . acetaminophen (TYLENOL) tablet 650 mg  650 mg Oral Q6H PRN Kerry Hough, PA-C      . alum & mag hydroxide-simeth (MAALOX/MYLANTA) 200-200-20 MG/5ML suspension 30 mL  30 mL Oral Q4H PRN Kerry Hough, PA-C      . diphenhydrAMINE (BENADRYL) capsule 25 mg  25 mg Oral QHS PRN Kerry Hough, PA-C   25 mg at 12/18/12 2211  . magnesium hydroxide (MILK OF MAGNESIA) suspension 30 mL  30 mL Oral Daily PRN Kerry Hough, PA-C        Observation Level/Precautions:  Fall 15 minute checks  Laboratory:  Review of addmisssiioon labs  Psychotherapy:   group therapy individual therapy and milieu therapy   Medications:   Begin Lexapro 10 mg daily Protonix 20 mg twice daily before meals also provide feeding supplement ensure, continue Benadryl 25 mg at bedtime for sleep   Consultations:   none   Discharge Concerns:   safety   Estimated LOS: 5-7 days  Other:     I certify that inpatient services furnished can reasonably be expected to improve the patient's condition.    Nehemiah Settle., M.D.  8/6/201412:02 PM

## 2012-12-19 NOTE — Progress Notes (Signed)
D: Patient denies SI/HI and A/V hallucinations; patient reports not having anyone for help  A: Monitored q 15 minutes; patient encouraged to attend groups; patient educated about medications; patient given medications per physician orders; patient encouraged to express feelings and/or concerns  R: Patient is cooperative and minimal; patient is pleasant but assertive; patient's interaction with staff and peers is appropriate.; patient was able to set goal to talk with staff 1:1 when having feelings of SI; patient is taking medications as prescribed and tolerating medications; patient is attending the  groups

## 2012-12-19 NOTE — Progress Notes (Signed)
Patient did not attend the 1000 nursing group.

## 2012-12-20 NOTE — Progress Notes (Addendum)
D:  Patient's self inventory sheet, patient has poor sleep, improving appetite, low energy level, improving attention span.  Denied depression, hopelessness and anxiety.   Denied withdrawals.  Denied physical problems.  Plans to return home after discharge.  No problems taking meds after discharge.   A:  Medications administered per MD orders.  Emotional support and encouragement given patient. R:  Denied SI and HI.  Contracts for safety.   Denied A/V hallucinations.  Will continue to monitor patient for safety with 15 minute checks.  Safety maintained.  Patient stated he was up 6 times last night with burning and urgency for urination.  UA container given to patient but specimen not yet received.  Will continue to ask patient for UA.    Ambulates with walker.  Went to recreation and came back early.  Nurse asked patient several times throughout day to leave UA specimen next time he used bathroom.  Patient agrees but no UA collected as of 1800.    1900  Patient has not completed UA as of this time per nurse's request several times throughout day.

## 2012-12-20 NOTE — [facility] Suicide Risk Assessment (Signed)
[facility] INPATIENT:  Family/Significant Other Suicide Prevention Education  Suicide Prevention Education:  Education Completed; Orlen Leedy, Son, [phone]; has been identified by the patient as the family member/significant other with whom the patient will be residing, and identified as the person(s) who will aid the patient in the event of a mental health crisis (suicidal ideations/suicide attempt).  With written consent from the patient, the family member/significant other has been provided the following suicide prevention education, prior to the and/or following the discharge of the patient.  The suicide prevention education provided includes the following:  Suicide risk factors  Suicide prevention and interventions  National Suicide Hotline telephone number  [facility] assessment telephone number  [facility] Emergency Assistance 911  [facility] and/or Residential Mobile Crisis Unit telephone number  Request made of family/significant other to:  Remove weapons (e.g., guns, rifles, knives), all items previously/currently identified as safety concern.  Son reports patient does not have access to guns.  Remove drugs/medications (over-the-counter, prescriptions, illicit drugs), all items previously/currently identified as a safety concern.  The family member/significant other verbalizes understanding of the suicide prevention education information provided.  The family member/significant other agrees to remove the items of safety concern listed above.  Wynn Banker 12/20/2012, 11:59 AM

## 2012-12-20 NOTE — Progress Notes (Signed)
The focus of this group is to educate the patient on the purpose and policies of crisis stabilization and provide a format to answer questions about their admission.  The group details unit policies and expectations of patients while admitted.  Patient attended 0900 orientation nurse education group this morning.  Patient actively participated, appropriate affect, alert, appropriate insight, engagement appropriate.  Patient commented several times on laughter and caused others to laugh during group.  Patient will work on discharge goals today.

## 2012-12-20 NOTE — Progress Notes (Signed)
Patient ID: Edward Trevino, male   DOB: 20-Mar-1932, 79 y.o.   MRN: [id number] PER STATE REGULATIONS 482.30  THIS CHART WAS REVIEWED FOR MEDICAL NECESSITY WITH RESPECT TO THE PATIENT'S ADMISSION/ DURATION OF STAY.  NEXT REVIEW DATE: 12/24/2012  Willa Rough, RN, BSN CASE MANAGER
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
## 2012-12-20 NOTE — Progress Notes (Signed)
Adult Psychoeducational Group Note  Date:  12/20/2012 Time:  11:00AM Group Topic/Focus:  Self Esteem Action Plan:   The focus of this group is to help patients create a plan to continue to build self-esteem after discharge.  Participation Level:  Did Not Attend    Additional Comments: Pt. Didn't attend group.   Bing Plume D 12/20/2012, 11:50 AM
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
## 2012-12-20 NOTE — [facility] Group Notes (Signed)
[facility] LCSW Group Therapy  12/20/2012 3:20 PM  Type of Therapy:  Group Therapy  Participation Level:  Minimal  Participation Quality:  Appropriate  Affect:  Appropriate  Cognitive:  Appropriate  Insight:  Developing/Improving  Engagement in Therapy:  Developing/Improving  Modes of Intervention:  Discussion, Education, Exploration, Problem-solving, Rapport Building and Support  Summary of Progress/Problems:  Patient listened attentively to speaker from [employer].  He commented on how mental health issues had been perceived in the past.   Wynn Banker 12/20/2012, 3:20 PM

## 2012-12-20 NOTE — Progress Notes (Signed)
Pt observed lying awake in bed.  He states he is still feeling tired and depressed.  He says he has gone to some groups.  He is quite jovial and bright with this Clinical research associate.  He denies SI/HI/AV.  Pt was encouraged to use his walker when ambulating and pt voices understanding.  Pt provided a urine specimen for UA ordered.  Pt makes his needs known to staff.  He is unsure when he will be discharged.  Support and encouragement offered.  Safety maintained with q15 minute checks.

## 2012-12-21 DIAGNOSIS — E440 Moderate protein-calorie malnutrition: Secondary | ICD-10-CM

## 2012-12-21 DIAGNOSIS — F322 Major depressive disorder, single episode, severe without psychotic features: Secondary | ICD-10-CM

## 2012-12-21 DIAGNOSIS — R109 Unspecified abdominal pain: Secondary | ICD-10-CM

## 2012-12-21 DIAGNOSIS — F4323 Adjustment disorder with mixed anxiety and depressed mood: Secondary | ICD-10-CM

## 2012-12-21 DIAGNOSIS — F341 Dysthymic disorder: Secondary | ICD-10-CM

## 2012-12-21 DIAGNOSIS — F489 Nonpsychotic mental disorder, unspecified: Secondary | ICD-10-CM

## 2012-12-21 DIAGNOSIS — F5105 Insomnia due to other mental disorder: Secondary | ICD-10-CM

## 2012-12-21 LAB — URINALYSIS, DIPSTICK ONLY
Bilirubin Urine: NEGATIVE
Hgb urine dipstick: NEGATIVE
Leukocytes, UA: NEGATIVE
Nitrite: NEGATIVE
Protein, ur: NEGATIVE mg/dL
Urobilinogen, UA: 0.2 mg/dL (ref 0.0–1.0)

## 2012-12-21 MED ORDER — NITROFURANTOIN MONOHYD MACRO 100 MG PO CAPS
100.0000 mg | ORAL_CAPSULE | Freq: Two times a day (BID) | ORAL | Status: DC
  Administered 2012-12-21: 100 mg via ORAL
  Filled 2012-12-21 (×4): qty 1

## 2012-12-21 MED ORDER — TAMSULOSIN HCL 0.4 MG PO CAPS
0.4000 mg | ORAL_CAPSULE | Freq: Every day | ORAL | Status: DC
  Administered 2012-12-21 – 2012-12-24 (×4): 0.4 mg via ORAL
  Filled 2012-12-21 (×7): qty 1

## 2012-12-21 NOTE — Progress Notes (Signed)
[facility] MD Progress Note  12/21/2012 12:49 PM Edward Trevino  MRN:  [id number] Subjective: " I still have pain when I go to the bathroom, it burns, and I'm up and down most of the night." Patient has been up and active in the unit millieu. He is well liked by his fellow patients. He says his depression is improving but he is focused on his bladder discomfort. Diagnosis: Insomnia secondary to depression with anxiety   ADL's:  Intact  Sleep: Good  Appetite:  Fair  Suicidal Ideation:  denies Homicidal Ideation:  denies AEB (as evidenced by):  Psychiatric Specialty Exam: ROS  Blood pressure 115/75, pulse 90, temperature 97.7 F (36.5 C), temperature source Oral, resp. rate 18, height 6\' 3"  (1.905 m), weight 79.8 kg (175 lb 14.8 oz).Body mass index is 21.99 kg/(m^2).  General Appearance: Disheveled  Eye Solicitor::  Fair  Speech:  Clear and Coherent  Volume:  Normal  Mood:  Depressed  Affect:  Constricted  Thought Process:  Linear  Orientation:  Full (Time, Place, and Person)  Thought Content:  WDL  Suicidal Thoughts:  No  Homicidal Thoughts:  No  Memory:  Immediate;   Fair  Judgement:  Impaired  Insight:  Lacking  Psychomotor Activity:  Decreased  Concentration:  Fair  Recall:  Fair  Akathisia:  No  Handed:  Right  AIMS (if indicated):     Assets:  Desire for Improvement  Sleep:  Number of Hours: 5.5   Current Medications: Current Facility-Administered Medications  Medication Dose Route Frequency Provider Last Rate Last Dose  . acetaminophen (TYLENOL) tablet 650 mg  650 mg Oral Q6H PRN Kerry Hough, PA-C      . alum & mag hydroxide-simeth (MAALOX/MYLANTA) 200-200-20 MG/5ML suspension 30 mL  30 mL Oral Q4H PRN Kerry Hough, PA-C      . diphenhydrAMINE (BENADRYL) capsule 25 mg  25 mg Oral QHS PRN Kerry Hough, PA-C   25 mg at 12/20/12 2301  . escitalopram (LEXAPRO) tablet 10 mg  10 mg Oral Daily Nehemiah Settle, MD   10 mg at 12/21/12 0839  . feeding  supplement (ENSURE COMPLETE) liquid 237 mL  237 mL Oral BID BM Nehemiah Settle, MD   237 mL at 12/21/12 1400  . magnesium hydroxide (MILK OF MAGNESIA) suspension 30 mL  30 mL Oral Daily PRN Kerry Hough, PA-C      . multivitamin with minerals tablet 1 tablet  1 tablet Oral Daily Nehemiah Settle, MD   1 tablet at 12/21/12 [phone]  . pantoprazole (PROTONIX) EC tablet 20 mg  20 mg Oral BID AC Nehemiah Settle, MD   20 mg at 12/21/12 4401    Lab Results:  Results for orders placed during the hospital encounter of 12/18/12 (from the past 48 hour(s))  URINALYSIS, DIPSTICK ONLY     Status: None   Collection Time    12/20/12  7:00 PM      Result Value Range   Specific Gravity, Urine 1.016  1.005 - 1.030   pH 6.0  5.0 - 8.0   Glucose, UA NEGATIVE  NEGATIVE mg/dL   Hgb urine dipstick NEGATIVE  NEGATIVE   Bilirubin Urine NEGATIVE  NEGATIVE   Ketones, ur NEGATIVE  NEGATIVE mg/dL   Protein, ur NEGATIVE  NEGATIVE mg/dL   Urobilinogen, UA 0.2  0.0 - 1.0 mg/dL   Nitrite NEGATIVE  NEGATIVE   Leukocytes, UA NEGATIVE  NEGATIVE   Comment: Performed at [facility]    Physical Findings: AIMS: Facial and Oral Movements Muscles of Facial Expression: None, normal Lips and Perioral Area: None, normal Jaw: None, normal Tongue: None, normal,Extremity Movements Upper (arms, wrists, hands, fingers): None, normal Lower (legs, knees, ankles, toes): None, normal, Trunk Movements Neck, shoulders, hips: None, normal, Overall Severity Severity of abnormal movements (highest score from questions above): None, normal Incapacitation due to abnormal movements: None, normal Patient's awareness of abnormal movements (rate only patient's report): No Awareness, Dental Status Current problems with teeth and/or dentures?: No Does patient usually wear dentures?: No  CIWA:  CIWA-Ar Total: 1 COWS:  COWS Total Score: 3  Treatment Plan Summary: Daily contact with patient to  assess and evaluate symptoms and progress in treatment Medication management  Plan: 1. Continue crisis management and stabilization. 2. Medication management to reduce current symptoms to base line and improve patient's overall level of functioning 3. Treat health problems as indicated. 4. Develop treatment plan to decrease risk of relapse upon discharge and the need for     readmission. 5. Psycho-social education regarding relapse prevention and self care. 6. Health care follow up as needed for medical problems. 7. Continue home medications where appropriate. 8. IM consult is called and Macrobid is started as well as Flomax. IM MD will see the patient. 9. Anticipate D/C Monday if no complications.   Medical Decision Making Problem Points:  Established problem, stable/improving (1) and New problem, with additional work-up planned (4) Data Points:  Review or order medicine tests (1)  I certify that inpatient services furnished can reasonably be expected to improve the patient's condition.  Rona Ravens. [place] RPAC 12:50 PM 12/21/2012  Reviewed the information documented and agree with the treatment plan.  Ruwayda Curet,JANARDHAHA R. 12/21/2012 7:54 PM

## 2012-12-21 NOTE — Progress Notes (Signed)
Pt. Denies depression at this time.  Writer attempted to discuss nutrition and appetite with the pt. And the pt. States that he eats when he is hungry but lives alone and does not like to cook.  Pt. States he just eats sandwiches.  Pt. Reports that he is just bored at [facility].  States he is use to going to bed at 1800 and getting up at 3am.  Pt. Reported difficulty sleeping last night and request the same med that he had which was a benadryl and Clinical research associate did give pt. The requested benadryl for sleep.  Pt. Did not come out of his room this pm for group or snack.
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
## 2012-12-21 NOTE — [facility] Group Notes (Signed)
[facility] LCSW Group Therapy  Feelings Around Relapse 1:15 -2:30        12/21/2012  1:11 PM   Type of Therapy:  Group Therapy  Participation Level:  Appropriate  Participation Quality:  Appropriate  Affect:  Appropriate  Cognitive:  Attentive Appropriate  Insight:  Engaged  Engagement in Therapy:  Engaged  Modes of Intervention:  Discussion Exploration Problem-Solving Supportive  Summary of Progress/Problems:  The topic for today was feelings around relapse.  Patient listened attentively but did not engage in discussion.  Edward Trevino 12/21/2012 1:11 PM

## 2012-12-21 NOTE — Progress Notes (Signed)
Chaplain consulted with pt in response to pt request / SW referral.    Pt lying on bed in room upon chaplain arrival.  Sat at bedside to speak with chaplain.  Pt appreciative of chaplain presence.  Speaking with soft voice and exhibiting blunted affect.   Spoke with chaplain about feeling of regret around previous choices.  Stated that he feels his life is short and he is nervous about what will happen afterward.  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  Chaplain engaged in grief support.  Spoke with pt about ways his care for family were manifested in his acts of providing.    Belva Crome MDiv

## 2012-12-21 NOTE — Progress Notes (Addendum)
[name] is seen OOB UAL on the 500 hall today. HE is isolative, he continues to keep to himself, not talking with other pts, but he does converse with staff members. HE says " You know, this stuff's really good, especially if you haven't been eating much.   A His affect is flat and depressed. HE likes his ensures, drinks them readily and is very appreciative of the attention he is being given, saying " thank you o so much".   R He does not fill his self inventory out, saying "its just too hard". Safety is in place and POC moves forward.

## 2012-12-21 NOTE — [facility] Group Notes (Signed)
   [facility] LCSW Aftercare Discharge Planning Group Note   12/21/2012 1:08 PM    Participation Quality:  Appropraite  Mood/Affect:  Appropriate  Depression Rating:  1  Anxiety Rating:  1  Thoughts of Suicide:  No  Will you contract for safety?   NA  Current AVH:  No  Plan for Discharge/Comments:  Patient attending discharge planning group and actively participated in group.  He questioned when he would be discharging.  He was informed MD meets with patients daily and makes a decision based on how well he is doing.  Patient was informed Clinical research associate spoke with son.  He was also advised of outpatient follow up being scheduled Daily workbook provided to all participants with daily workbook and information on services offered by [facility] [place].   Transportation Means: Patient has transportation.   Supports:  Patient has a support system.   Emit Kuenzel, [name]

## 2012-12-21 NOTE — Progress Notes (Signed)
Psychoeducational Group Note  Date:  12/21/2012 Time:  2000  Group Topic/Focus:  Wrap-Up Group:   The focus of this group is to help patients review their daily goal of treatment and discuss progress on daily workbooks.  Participation Level: Did Not Attend  Participation Quality:  Not Applicable  Affect:  Not Applicable  Cognitive:  Not Applicable  Insight:  Not Applicable  Engagement in Group: Not Applicable  Additional Comments:  The patient refused to attend group since he wished to take a nap.   Sundance Moise S 12/21/2012, 11:06 PM

## 2012-12-21 NOTE — Progress Notes (Signed)
[facility] Group Notes:  (Nursing/MHT/Case Management/Adjunct)  Date:  12/21/2012  Time:  10:36 AM  Type of Therapy:  Therapeutic Activity  Participation Level:  Did Not Attend  Edward Trevino 12/21/2012, 10:36 AM

## 2012-12-21 NOTE — Consult Note (Signed)
[facility] Medical Consultation  Edward Trevino ZOX:[id number] DOB: [birth year] DOA: 12/18/2012 PCP: Louie Boston, MD   Requesting physician: Dr. Elsie Saas (psychiatry) Date of consultation: 12/21/2012 Reason for consultation: urnary frequency and urgency  HPI: 79 year old very pleasant gentleman who presented to [facility] for insomnia and depression which he has a known history of. Patient is doing relatively well in [facility] but has noticed occasional lower abdominal pain or discomfort on and off for past few days which sometime may go to the back. He noticed urinary frequency especially at night time and urgency. No difficulty urination. He occasionally has burning sensation on urination. No reports of fever or chills. No nausea or vomiting. Consult request for the symptoms above.  Assessment and Plan:  Principal Problem: Urinary urgency and frequency, dysuria - possible urinary tract infection but urinalysis negative; possibility also  includes BPH - will start empiric treatment for UTI, macrobid 100 mg PO BID for total of 5 days - we will also start Flomax 0.4 mg daily and see if symptoms are improving  - since there seems to be no concern for an obstruction as pt reports having good urinary flow and good urine output I will hold off on renal US for now  We will follow up again tomorrow  Manson Passey [facility] [phone] or [phone]  Review of Systems:  Constitutional: Negative for fever, chills, diaphoresis, activity change, appetite change and fatigue.  HENT: Negative for ear pain, nosebleeds, congestion, facial swelling, rhinorrhea, neck pain, neck stiffness and ear discharge.   Eyes: Negative for pain, discharge, redness, itching and visual disturbance.  Respiratory: Negative for cough, choking, chest tightness, shortness of breath, wheezing and stridor.   Cardiovascular: Negative for chest pain, palpitations and leg swelling.  Gastrointestinal: Negative for abdominal distention.   Genitourinary: per HPI.  Musculoskeletal: Negative for back pain, joint swelling, arthralgias and gait problem.  Neurological: Negative for dizziness, tremors, seizures, syncope, facial asymmetry, speech difficulty, weakness, light-headedness, numbness and headaches.  Hematological: Negative for adenopathy. Does not bruise/bleed easily.  Psychiatric/Behavioral: Negative for hallucinations, behavioral problems, confusion    Past Medical History  Diagnosis Date  . Depression   . Insomnia   . Anxiety   . Suicide attempt   . Erosive esophagitis 07/25/2012  . Gastric out let obstruction 07/25/2012  . HOH (hard of hearing)    Past Surgical History  Procedure Laterality Date  . Appendectomy    . Back surgery    . Ercp N/A 07/24/2012    Dr. Jena Gauss. Significant abnormalities of the bowl and proximal second portion producing partial gastric outlet stricture and with secondary gastric dilation and severe erosive reflux esophagitis. Biopsy showed benign ulceration. Normal-appearing ampulla, status post biliary sphincterotomy and ampulla balloon dilation, stone extraction and stent placement.  Marland Kitchen Sphincterotomy N/A 07/24/2012    Procedure: SPHINCTEROTOMY;  Surgeon: Corbin Ade, MD;  Location: [facility];  Service: Endoscopy;  Laterality: N/A;  . Esophageal biopsy N/A 07/24/2012    Procedure: BIOPSY;  Surgeon: Corbin Ade, MD;  Location: [facility];  Service: Endoscopy;  Laterality: N/A;  Duodenal and Esophageal Biopsies  . Esophagogastroduodenoscopy N/A 07/24/2012    MVH:[id number] ampulla s/p biliary sphincterotomy and ampullary  . Balloon dilation N/A 07/24/2012    Procedure: BALLOON DILATION;  Surgeon: Corbin Ade, MD;  Location: [facility];  Service: Endoscopy;  Laterality: N/A;  Balloon Stone Extraction, Drudging  . Removal of stones N/A 07/24/2012    Procedure: REMOVAL OF STONES;  Surgeon: Corbin Ade, MD;  Location: [facility];  Service: Endoscopy;  Laterality: N/A;  . Egd/ercp  10/18/2012     Dr. Jena Gauss: ;yloric channel stenosis, s/p dilation and bx, persisting CBD stones with extension of sphincterotomy and sphincterotomy balloon dilation and stone extraction. Removal of biliary stent  . Esophagogastroduodenoscopy (egd) with propofol N/A 10/18/2012    ZOX:WRUEAVW channel stenosis s/p dilation/Persisting common bile duct stones with extension of sphincterotomy     and sphincterotomy balloon dilation and stone extraction.  Biliary stent removal   . Ercp N/A 10/18/2012    Procedure: ENDOSCOPIC RETROGRADE CHOLANGIOPANCREATOGRAPHY (ERCP);  Surgeon: Corbin Ade, MD;  Location: [facility];  Service: Endoscopy;  Laterality: N/A;  . Removal of stones N/A 10/18/2012    Procedure: REMOVAL OF STONES;  Surgeon: Corbin Ade, MD;  Location: [facility];  Service: Endoscopy;  Laterality: N/A;  . Esophageal biopsy N/A 10/18/2012    Procedure: BIOPSY;  Surgeon: Corbin Ade, MD;  Location: [facility];  Service: Endoscopy;  Laterality: N/A;  . Cholecystectomy N/A 10/26/2012    Procedure: LAPAROSCOPIC CHOLECYSTECTOMY;  Surgeon: Dalia Heading, MD;  Location: [facility];  Service: General;  Laterality: N/A;  . Laparoscopy N/A 10/31/2012    Procedure: LAPAROSCOPY DIAGNOSTIC;  Surgeon: Fabio Bering, MD;  Location: [facility];  Service: General;  Laterality: N/A;   Social History:  reports that he has been smoking Cigars.  He has never used smokeless tobacco. He reports that he does not drink alcohol or use illicit drugs.  No Known Allergies Family History  Problem Relation Age of Onset  . Colon cancer Neg Hx     Prior to Admission medications   Medication Sig Start Date End Date Taking? Authorizing Provider  clonazePAM (KLONOPIN) 0.5 MG tablet Take 1 tablet (0.5 mg total) by mouth 3 (three) times daily. Takes one tablet twice daily and 2 tablets at bedtime. 11/13/12   Fabio Bering, MD  ENSURE PLUS (ENSURE PLUS) LIQD Take 237 mLs by mouth 2 (two) times daily between meals.    Historical Provider, MD  feeding supplement  (RESOURCE BREEZE) LIQD Take 1 Container by mouth 3 (three) times daily between meals. 11/05/12   Fabio Bering, MD  megestrol (MEGACE) 40 MG tablet Take 1 tablet (40 mg total) by mouth daily. 11/13/12   Fabio Bering, MD  Multiple Vitamin (MULTIVITAMIN WITH MINERALS) TABS Take 1 tablet by mouth daily. 07/26/12   Elliot Cousin, MD  omeprazole (PRILOSEC) 20 MG capsule Take 20 mg by mouth 2 (two) times daily.    Historical Provider, MD  QUEtiapine (SEROQUEL) 100 MG tablet Take 1 tablet (100 mg total) by mouth 3 (three) times daily. 07/26/12   Elliot Cousin, MD  venlafaxine XR (EFFEXOR-XR) 75 MG 24 hr capsule Take 1 capsule (75 mg total) by mouth daily. 07/26/12   Elliot Cousin, MD   Physical Exam: Blood pressure 115/75, pulse 90, temperature 97.7 F (36.5 C), temperature source Oral, resp. rate 18, height 6\' 3"  (1.905 m), weight 79.8 kg (175 lb 14.8 oz). Filed Vitals:   12/21/12 0708  BP: 115/75  Pulse: 90  Temp: 97.7 F (36.5 C)  Resp: 18    Physical Exam  Constitutional: Appears well-developed and well-nourished. No distress.  HENT: Normocephalic. External right and left ear normal. Oropharynx is clear and moist.  Eyes: Conjunctivae and EOM are normal. PERRLA, no scleral icterus.  Neck: Normal ROM. Neck supple. No JVD. No tracheal deviation. No thyromegaly.  CVS: RRR, S1/S2 +, no murmurs, no gallops, no  carotid bruit.  Pulmonary: Effort and breath sounds normal, no stridor, rhonchi, wheezes, rales.  Abdominal: Soft. BS +,  no distension, tenderness, rebound or guarding.  Musculoskeletal: Normal range of motion. No edema and no tenderness.  Lymphadenopathy: No lymphadenopathy noted, cervical, inguinal. Neuro: Alert. Normal reflexes, muscle tone coordination. No cranial nerve deficit. Skin: Skin is warm and dry. No rash noted. Not diaphoretic. No erythema. No pallor.  Psychiatric: Normal mood and affect.     Labs on Admission:  Basic Metabolic Panel:  Recent Labs Lab 12/18/12 1103   NA 138  K 3.9  CL 103  CO2 26  GLUCOSE 100*  BUN 15  CREATININE 1.15  CALCIUM 9.3   Liver Function Tests:  Recent Labs Lab 12/18/12 1103  AST 29  ALT 31  ALKPHOS 149*  BILITOT 0.3  PROT 7.0  ALBUMIN 3.5   No results found for this basename: LIPASE, AMYLASE,  in the last 168 hours No results found for this basename: AMMONIA,  in the last 168 hours CBC:  Recent Labs Lab 12/18/12 1103  WBC 6.9  HGB 12.4*  HCT 35.5*  MCV 92.0  PLT 292   Cardiac Enzymes: No results found for this basename: CKTOTAL, CKMB, CKMBINDEX, TROPONINI,  in the last 168 hours BNP: No components found with this basename: POCBNP,  CBG: No results found for this basename: GLUCAP,  in the last 168 hours  Radiological Exams on Admission: No results found.   Time spent: 45 minutes  Manson Passey [facility] Pager [phone]  If 7PM-7AM, please contact night-coverage [email or web address] Password [facility] 12/21/2012, 5:13 PM

## 2012-12-22 ENCOUNTER — Encounter (HOSPITAL_COMMUNITY): Payer: Self-pay | Admitting: Registered Nurse

## 2012-12-22 DIAGNOSIS — R3915 Urgency of urination: Secondary | ICD-10-CM

## 2012-12-22 MED ORDER — ENSURE COMPLETE PO LIQD
237.0000 mL | Freq: Two times a day (BID) | ORAL | Status: DC
  Administered 2012-12-24 – 2012-12-28 (×6): 237 mL via ORAL
  Filled 2012-12-22 (×3): qty 237

## 2012-12-22 NOTE — Progress Notes (Signed)
[facility] PROGRESS NOTE  Edward Trevino GMW:[id number] DOB: [birth year] DOA: 12/18/2012 PCP: Louie Boston, MD  Assessment/Plan: 1-Urinary urgency, frequency: No more dysuria. Will continue with Flomax. Will discontinue antibiotics due to diarrhea and clean urine.  Patient symptoms improving.  2-Diarrhea; probably secondary to antibiotics. Will discontinue antibiotics. Monitor.   Will sign off. Please call with question.   Procedures: none  Antibiotics:  Macrobid. One dose.   HPI/Subjective: Relates improvement of urinary symptoms.  Report large, watery BM this morning. No more episodes.    Objective: Filed Vitals:   12/22/12 0700  BP: 102/64  Pulse: 80  Temp: 97.3 F (36.3 C)  Resp: 16   No intake or output data in the 24 hours ending 12/22/12 1155 Filed Weights   12/18/12 1900  Weight: 79.8 kg (175 lb 14.8 oz)    Exam:   General:  No distresss  Abdomen: Bs present, soft  Musculoskeletal: no edema  Data Reviewed: Basic Metabolic Panel:  Recent Labs Lab 12/18/12 1103  NA 138  K 3.9  CL 103  CO2 26  GLUCOSE 100*  BUN 15  CREATININE 1.15  CALCIUM 9.3   Liver Function Tests:  Recent Labs Lab 12/18/12 1103  AST 29  ALT 31  ALKPHOS 149*  BILITOT 0.3  PROT 7.0  ALBUMIN 3.5   No results found for this basename: LIPASE, AMYLASE,  in the last 168 hours No results found for this basename: AMMONIA,  in the last 168 hours CBC:  Recent Labs Lab 12/18/12 1103  WBC 6.9  HGB 12.4*  HCT 35.5*  MCV 92.0  PLT 292   Cardiac Enzymes: No results found for this basename: CKTOTAL, CKMB, CKMBINDEX, TROPONINI,  in the last 168 hours BNP (last 3 results) No results found for this basename: PROBNP,  in the last 8760 hours CBG: No results found for this basename: GLUCAP,  in the last 168 hours  No results found for this or any previous visit (from the past 240 hour(s)).   Studies: No results found.  Scheduled Meds: . escitalopram  10 mg Oral  Daily  . feeding supplement  237 mL Oral BID BM  . multivitamin with minerals  1 tablet Oral Daily  . pantoprazole  20 mg Oral BID AC  . tamsulosin  0.4 mg Oral Daily   Continuous Infusions:   Principal Problem:   Insomnia secondary to depression with anxiety Active Problems:   MDD (major depressive disorder), recurrent severe, without psychosis   Adjustment disorder with mixed anxiety and depressed mood   Suicide    Time spent: 25 minutes.     Genia Perin  [facility] Pager [phone]. If 7PM-7AM, please contact night-coverage at [email or web address], password [facility] 12/22/2012, 11:55 AM  LOS: 4 days

## 2012-12-22 NOTE — Progress Notes (Signed)
Psychoeducational Group Note  Date: 12/22/2012 Time:  1015  Group Topic/Focus:  Identifying Needs:   The focus of this group is to help patients identify their personal needs that have been historically problematic and identify healthy behaviors to address their needs.  Participation Level:  Did not attend  Kriste Broman A 

## 2012-12-22 NOTE — Progress Notes (Signed)
D Cyris has not felt well today. HE stated he had diarrhea " in the early early morning" and afterwards " I just can't get back to myself". He is pale. HE states he feels weak and washed out.   A NP notified. Pt given pitcher of gatorade and instructed to drink as much of it and water as he can tolerate. His VS are checked and his BP is moderately  low. HE walks using his walker and is steady on his feet, but becomes very pale after ambuating 50 ft.   R Pt is given call bell and is encouraged to call for help if he needs to get up. Staff and pt are made a wae he is high fall risk and POC cont with safety intact.

## 2012-12-22 NOTE — Progress Notes (Signed)
Psychoeducational Group Note  Date:  12/22/2012 Time:  2000  Group Topic/Focus:  Wrap-Up Group:   The focus of this group is to help patients review their daily goal of treatment and discuss progress on daily workbooks.  Participation Level: Did Not Attend  Participation Quality:  Not Applicable  Affect:  Not Applicable  Cognitive:  Not Applicable  Insight:  Not Applicable  Engagement in Group: Not Applicable  Additional Comments:  The patient did not attend group since he felt too drowsy.   Sheyenne Konz S 12/22/2012, 11:21 PM+

## 2012-12-22 NOTE — Progress Notes (Signed)
Patient ID: Edward Trevino, male   DOB: 05-15-1932, 79 y.o.   MRN: [id number] [facility] MD Progress Note  12/22/2012 11:14 AM ABDON PETROSKY  MRN:  [id number] Subjective:Patient states that he is improving.  Patient lying in bed.  States that he has a fall early in the morning while in the bathroom and then had diarrhea this morning and is feeling a little weak. Patient states that he continues to have suicidal ideation but is improving. 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  Patient is tolerating medication without adverse effects.    Psychiatric Specialty Exam: ROS  Blood pressure 102/64, pulse 80, temperature 97.3 F (36.3 C), temperature source Oral, resp. rate 16, height 6\' 3"  (1.905 m), weight 79.8 kg (175 lb 14.8 oz).Body mass index is 21.99 kg/(m^2).  General Appearance: Disheveled  Eye Solicitor::  Fair  Speech:  Clear and Coherent  Volume:  Normal  Mood:  Depressed  Affect:  Constricted  Thought Process:  Linear  Orientation:  Full (Time, Place, and Person)  Thought Content:  WDL  Suicidal Thoughts:  Improving  Homicidal Thoughts:  No  Memory:  Immediate;   Fair  Judgement:  Impaired  Insight:  Lacking  Psychomotor Activity:  Decreased  Concentration:  Fair  Recall:  Fair  Akathisia:  No  Handed:  Right  AIMS (if indicated):     Assets:  Desire for Improvement  Sleep:  Number of Hours: 5.5   Current Medications: Current Facility-Administered Medications  Medication Dose Route Frequency Provider Last Rate Last Dose  . acetaminophen (TYLENOL) tablet 650 mg  650 mg Oral Q6H PRN Kerry Hough, PA-C      . alum & mag hydroxide-simeth (MAALOX/MYLANTA) 200-200-20 MG/5ML suspension 30 mL  30 mL Oral Q4H PRN  Kerry Hough, PA-C      . diphenhydrAMINE (BENADRYL) capsule 25 mg  25 mg Oral QHS PRN Kerry Hough, PA-C   25 mg at 12/21/12 2123  . escitalopram (LEXAPRO) tablet 10 mg  10 mg Oral Daily Nehemiah Settle, MD   10 mg at 12/21/12 0839  . feeding supplement (ENSURE COMPLETE) liquid 237 mL  237 mL Oral BID BM Belkys A Regalado, MD      . magnesium hydroxide (MILK OF MAGNESIA) suspension 30 mL  30 mL Oral Daily PRN Kerry Hough, PA-C      . multivitamin with minerals tablet 1 tablet  1 tablet Oral Daily Nehemiah Settle, MD   1 tablet at 12/21/12 [phone]  . pantoprazole (PROTONIX) EC tablet 20 mg  20 mg Oral BID AC Nehemiah Settle, MD   20 mg at 12/22/12 0610  . tamsulosin (FLOMAX) capsule 0.4 mg  0.4 mg Oral Daily Verne Spurr, PA-C   0.4 mg at 12/21/12 1629    Lab Results:  Results for orders placed during the hospital encounter of 12/18/12 (from the past 48 hour(s))  URINALYSIS, DIPSTICK ONLY     Status: None   Collection Time    12/20/12  7:00 PM      Result Value Range   Specific Gravity, Urine 1.016  1.005 - 1.030   pH 6.0  5.0 - 8.0   Glucose, UA NEGATIVE  NEGATIVE mg/dL   Hgb  urine dipstick NEGATIVE  NEGATIVE   Bilirubin Urine NEGATIVE  NEGATIVE   Ketones, ur NEGATIVE  NEGATIVE mg/dL   Protein, ur NEGATIVE  NEGATIVE mg/dL   Urobilinogen, UA 0.2  0.0 - 1.0 mg/dL   Nitrite NEGATIVE  NEGATIVE   Leukocytes, UA NEGATIVE  NEGATIVE   Comment: Performed at [facility]    Physical Findings: AIMS: Facial and Oral Movements Muscles of Facial Expression: None, normal Lips and Perioral Area: None, normal Jaw: None, normal Tongue: None, normal,Extremity Movements Upper (arms, wrists, hands, fingers): None, normal Lower (legs, knees, ankles, toes): None, normal, Trunk Movements Neck, shoulders, hips: None, normal, Overall Severity Severity of abnormal movements (highest score from questions above): None, normal Incapacitation due to  abnormal movements: None, normal Patient's awareness of abnormal movements (rate only patient's report): No Awareness, Dental Status Current problems with teeth and/or dentures?: No Does patient usually wear dentures?: No  CIWA:  CIWA-Ar Total: 1 COWS:  COWS Total Score: 3  Treatment Plan Summary: Daily contact with patient to assess and evaluate symptoms and progress in treatment Medication management  Plan: Will continue current plan and treatment.  1. Continue crisis management and stabilization. 2. Medication management to reduce current symptoms to base line and improve patient's overall level of functioning 3. Treat health problems as indicated. 4. Develop treatment plan to decrease risk of relapse upon discharge and the need for     readmission. 5. Psycho-social education regarding relapse prevention and self care. 6. Health care follow up as needed for medical problems. 7. Continue home medications where appropriate. 8. IM consult is called and Macrobid is started as well as Flomax. IM MD will see the patient. 9. Anticipate D/C Monday if no complications.   Medical Decision Making Problem Points:  Established problem, stable/improving (1), Review of last therapy session (1) and Review of psycho-social stressors (1) Data Points:  Review or order clinical lab tests (1) Review and summation of old records (2) Review of medication regiment & side effects (2)    Tria Noguera, FNP-BC 12/22/2012 11:14 AM

## 2012-12-22 NOTE — [facility] Group Notes (Signed)
[facility] Group Notes: (Clinical Social Work)   12/22/2012      Type of Therapy:  Group Therapy   Participation Level:  Did Not Attend    Ambrose Mantle, LCSW 12/22/2012, 4:23 PM

## 2012-12-23 ENCOUNTER — Encounter (HOSPITAL_COMMUNITY): Payer: Self-pay | Admitting: Emergency Medicine

## 2012-12-23 ENCOUNTER — Inpatient Hospital Stay (HOSPITAL_COMMUNITY): Payer: Medicare Other

## 2012-12-23 LAB — CBC WITH DIFFERENTIAL/PLATELET
Basophils Absolute: 0 10*3/uL (ref 0.0–0.1)
Basophils Relative: 1 % (ref 0–1)
Eosinophils Absolute: 0.1 10*3/uL (ref 0.0–0.7)
Eosinophils Relative: 1 % (ref 0–5)
HCT: 34.3 % — ABNORMAL LOW (ref 39.0–52.0)
Hemoglobin: 11.5 g/dL — ABNORMAL LOW (ref 13.0–17.0)
Lymphocytes Relative: 26 % (ref 12–46)
MCHC: 33.5 g/dL (ref 30.0–36.0)
Monocytes Absolute: 0.5 10*3/uL (ref 0.1–1.0)
Monocytes Relative: 8 % (ref 3–12)
Neutro Abs: 3.9 10*3/uL (ref 1.7–7.7)
Neutrophils Relative %: 65 % (ref 43–77)
WBC: 6 10*3/uL (ref 4.0–10.5)

## 2012-12-23 LAB — COMPREHENSIVE METABOLIC PANEL
AST: 39 U/L — ABNORMAL HIGH (ref 0–37)
Albumin: 3.5 g/dL (ref 3.5–5.2)
Alkaline Phosphatase: 155 U/L — ABNORMAL HIGH (ref 39–117)
BUN: 14 mg/dL (ref 6–23)
CO2: 22 mEq/L (ref 19–32)
Chloride: 101 mEq/L (ref 96–112)
Creatinine, Ser: 1.06 mg/dL (ref 0.50–1.35)
GFR calc Af Amer: 76 mL/min — ABNORMAL LOW (ref >90)
GFR calc non Af Amer: 65 mL/min — ABNORMAL LOW (ref >90)
Glucose, Bld: 107 mg/dL — ABNORMAL HIGH (ref 70–99)
Potassium: 4.5 mEq/L (ref 3.5–5.1)
Total Bilirubin: 0.3 mg/dL (ref 0.3–1.2)

## 2012-12-23 LAB — POCT I-STAT TROPONIN I

## 2012-12-23 MED ORDER — SODIUM CHLORIDE 0.9 % IV BOLUS (SEPSIS)
500.0000 mL | Freq: Once | INTRAVENOUS | Status: AC
  Administered 2012-12-23: 14:00:00 via INTRAVENOUS

## 2012-12-23 MED ORDER — SODIUM CHLORIDE 0.9 % IV BOLUS (SEPSIS): 500.0000 mL | Freq: Once | INTRAVENOUS | Status: DC

## 2012-12-23 NOTE — ED Notes (Signed)
Pt from [facility].  Sent here for hypotension and fatigue.  Pt's BP is 136/75.

## 2012-12-23 NOTE — Progress Notes (Addendum)
D Pt is noted to be dizzy, unsteady on his feet and unable to walk alone today. HE states he " drank some coffee" for breakfast this morning. UNable to get pt to eat substantial amt of solid food. \  A Pt's BP is 102/88 HR 113 palpable (  Sitting )  and standing he is 72/58 HR  121.His color is pale and chalky.PA is notified and she requests he be sent to [facility] ED for evaluation. Pt is notified and security called to transport. PA states MHT to accompany.Verbal reports called to Gen RN in ED and pt requests his family not be notiifed.    R Safety is in place and POC to cont.

## 2012-12-23 NOTE — ED Notes (Signed)
Security to transport patient back to [facility]

## 2012-12-23 NOTE — ED Provider Notes (Signed)
TIME SEEN: 1:01 PM  CHIEF COMPLAINT: Hypotension, generalized weakness and lightheadedness  HPI: Patient is a 79 year old male with a history of depression with prior suicide attempt, anxiety who was sent here from [facility] for further evaluation for hypotension and tachycardia today. Patient was recently admitted to the behavioral health service for depression and suicidality. Today he was noted to have a blood pressure of 71/46 and a heart rate of 132. His mild hypotension and tachycardia have been present since 12/20/12 but more profound since 7:00 this morning. Patient reports that he does feel lightheaded with standing and has had generalized weakness and fatigue. He denies any fever, cough, vomiting or diarrhea, bloody stools or melena. No dysuria or hematuria, chest pain, shortness of breath. He states that he has a poor appetite and eats and drinks "when I feel like it".  ROS: See HPI Constitutional: no fever  Eyes: no drainage  ENT: no runny nose   Cardiovascular:  no chest pain  Resp: no SOB  GI: no vomiting GU: no dysuria Integumentary: no rash  Allergy: no hives  Musculoskeletal: no leg swelling  Neurological: no slurred speech ROS otherwise negative  PAST MEDICAL HISTORY/PAST SURGICAL HISTORY:  Past Medical History  Diagnosis Date  . Depression   . Insomnia   . Anxiety   . Suicide attempt   . Erosive esophagitis 07/25/2012  . Gastric out let obstruction 07/25/2012  . HOH (hard of hearing)     MEDICATIONS:  Prior to Admission medications   Medication Sig Start Date End Date Taking? Authorizing Provider  clonazePAM (KLONOPIN) 0.5 MG tablet Take 1 tablet (0.5 mg total) by mouth 3 (three) times daily. Takes one tablet twice daily and 2 tablets at bedtime. 11/13/12   Fabio Bering, MD  ENSURE PLUS (ENSURE PLUS) LIQD Take 237 mLs by mouth 2 (two) times daily between meals.    Historical Provider, MD  feeding supplement (RESOURCE BREEZE) LIQD Take 1  Container by mouth 3 (three) times daily between meals. 11/05/12   Fabio Bering, MD  megestrol (MEGACE) 40 MG tablet Take 1 tablet (40 mg total) by mouth daily. 11/13/12   Fabio Bering, MD  Multiple Vitamin (MULTIVITAMIN WITH MINERALS) TABS Take 1 tablet by mouth daily. 07/26/12   Elliot Cousin, MD  omeprazole (PRILOSEC) 20 MG capsule Take 20 mg by mouth 2 (two) times daily.    Historical Provider, MD  QUEtiapine (SEROQUEL) 100 MG tablet Take 1 tablet (100 mg total) by mouth 3 (three) times daily. 07/26/12   Elliot Cousin, MD  venlafaxine XR (EFFEXOR-XR) 75 MG 24 hr capsule Take 1 capsule (75 mg total) by mouth daily. 07/26/12   Elliot Cousin, MD    ALLERGIES:  No Known Allergies  SOCIAL HISTORY:  History  Substance Use Topics  . Smoking status: Current Every Day Smoker    Types: Cigars  . Smokeless tobacco: Never Used  . Alcohol Use: No     Comment: reported from ED son stated he has been drinking a lot of wine recently    FAMILY HISTORY: Family History  Problem Relation Age of Onset  . Colon cancer Neg Hx     EXAM: BP 136/75  Pulse 84  Temp(Src) 98.1 F (36.7 C) (Oral)  Resp 18  Ht 6\' 3"  (1.905 m)  Wt 175 lb 14.8 oz (79.8 kg)  BMI 21.99 kg/m2  SpO2 100% CONSTITUTIONAL: Alert and oriented x 3 and responds appropriately to questions. Elderly, cachectic HEAD: Normocephalic EYES: Conjunctivae  clear, PERRL, extraocular movements intact ENT: normal nose; no rhinorrhea; dry mucous membranes; pharynx without lesions noted NECK: Supple, no meningismus, no LAD  CARD: RRR; S1 and S2 appreciated; no murmurs, no clicks, no rubs, no gallops RESP: Normal chest excursion without splinting or tachypnea; breath sounds clear and equal bilaterally; no wheezes, no rhonchi, no rales,  ABD/GI: Normal bowel sounds; non-distended; soft, non-tender, no rebound, no guarding BACK:  The back appears normal and is non-tender to palpation, there is no CVA tenderness EXT: Normal ROM in all joints;  non-tender to palpation; no edema; normal capillary refill; no cyanosis    SKIN: Normal color for age and race; warm NEURO: Moves all extremities equally, normal gait, cranial nerves II through XII grossly intact, sensation to light touch intact diffusely, patient does become lightheaded with standing after several seconds PSYCH: The patient's mood and manner are appropriate. Grooming and personal hygiene are appropriate.  MEDICAL DECISION MAKING: Patient today with hypotension and tachycardia likely related to dehydration. We'll obtain labs to evaluate for anemia, electrolyte abnormality, infection, cardiac causes of his hypotension today. We'll obtain orthostatic vital signs and give IV fluids. Patient reports that he would like to go back to behavioral health once medically stable for further psychiatric treatment.   Date: 12/23/2012 13:50  Rate: 82  Rhythm: normal sinus rhythm  QRS Axis: normal, Q waves in anterior leads  Intervals: normal  ST/T Wave abnormalities: normal, no ST changes  Conduction Disutrbances: none  Narrative Interpretation: Q waves in anterior leads, no ST changes, no old for comparison     ED PROGRESS:  2:58 PM  VS have improved.  BP 170/110 with standing.  Pt able to tolerate po in ED.  Getting IVF.  Anticipate dc back to [facility] if work up negative.  3:11 PM  Labs, urine, chest x-ray, troponin all unremarkable. I do not appreciate second set of cardiac enzymes given his not having chest pain or shortness of breath and his hypertension has been going on since 7 AM this morning. It is now currently resolved. Likely due to dehydration. Will discharge back to behavioral health.  Layla Maw Keonia Pasko, DO 12/23/12 1551

## 2012-12-23 NOTE — Progress Notes (Addendum)
D Pt returned to [facility] via w/c and ( security to transport )  after receiving IV NS 500 cc bolus. Pt less letharic. Alert to surroundings. Thirsty and given cup of water. Pt notified that his son had called [facility] and advised pt to contact his son and gave him the number.    A He requests to be given some coffee and be allowed to eat dinner in his room, saying " they make too much noise down there". BP is 130/74 HR  83 standing and 128/76 80 .   R Safety is in place and POC cont.

## 2012-12-23 NOTE — Progress Notes (Signed)
Patient ID: Edward Trevino, male   DOB: 06-02-1931, 79 y.o.   MRN: [id number] Kahi Mohala MD Progress Note  EDI GORNIAK  MRN:  [id number] Subjective: patient seen sitting in wheel chair in hallway during group time. He states he just didn't feel like going to group this morning. He reports he "just can't handle the sadness and stress of other people's problems in groups. I just don't need that, it's too depressing, I've got my own problems to worry about." Objective: He denies any new symptoms states he is tired and fatigued. Reports he wants to go home in the mornings but by the afternoons he is glad he stayed at [facility]. This patient does not appear to be improving, his affect is incongruent with his reports of moderate depression at a 5/10, and low anxiety at 2/10.   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  Patient is tolerating medication without adverse effects.    Psychiatric Specialty Exam: ROS  Blood pressure 102/64, pulse 80, temperature 97.3 F (36.3 C), temperature source Oral, resp. rate 16, height 6\' 3"  (1.905 m), weight 79.8 kg (175 lb 14.8 oz).Body mass index is 21.99 kg/(m^2).  General Appearance: Disheveled  Eye Solicitor::  Fair  Speech:  Clear and Coherent  Volume:  Normal  Mood:  Depressed  Affect:  Constricted  Thought Process:  Linear  Orientation:  Full (Time, Place, and Person)  Thought Content:  WDL  Suicidal Thoughts:  Improving  Homicidal Thoughts:  No  Memory:  Immediate;   Fair  Judgement:  Impaired  Insight:  Lacking  Psychomotor Activity:  Decreased  Concentration:  Fair  Recall:  Fair  Akathisia:  No  Handed:  Right  AIMS (if indicated):     Assets:  Desire for Improvement  Sleep:  Number  of Hours: 5.5   Current Medications: Current Facility-Administered Medications  Medication Dose Route Frequency Provider Last Rate Last Dose  . acetaminophen (TYLENOL) tablet 650 mg  650 mg Oral Q6H PRN Kerry Hough, PA-C      . alum & mag hydroxide-simeth (MAALOX/MYLANTA) 200-200-20 MG/5ML suspension 30 mL  30 mL Oral Q4H PRN Kerry Hough, PA-C      . diphenhydrAMINE (BENADRYL) capsule 25 mg  25 mg Oral QHS PRN Kerry Hough, PA-C   25 mg at 12/21/12 2123  . escitalopram (LEXAPRO) tablet 10 mg  10 mg Oral Daily Nehemiah Settle, MD   10 mg at 12/21/12 0839  . feeding supplement (ENSURE COMPLETE) liquid 237 mL  237 mL Oral BID BM Belkys A Regalado, MD      . magnesium hydroxide (MILK OF MAGNESIA) suspension 30 mL  30 mL Oral Daily PRN Kerry Hough, PA-C      . multivitamin with minerals tablet 1 tablet  1 tablet Oral Daily Nehemiah Settle, MD   1 tablet at 12/21/12 [phone]  . pantoprazole (PROTONIX) EC tablet 20 mg  20 mg Oral BID AC Nehemiah Settle, MD   20 mg at 12/22/12 0610  . tamsulosin (FLOMAX) capsule 0.4 mg  0.4 mg Oral Daily Verne Spurr, PA-C   0.4 mg at 12/21/12 1629    Lab Results:  Results for orders placed during the hospital encounter of 12/18/12 (  from the past 48 hour(s))  URINALYSIS, DIPSTICK ONLY     Status: None   Collection Time    12/20/12  7:00 PM      Result Value Range   Specific Gravity, Urine 1.016  1.005 - 1.030   pH 6.0  5.0 - 8.0   Glucose, UA NEGATIVE  NEGATIVE mg/dL   Hgb urine dipstick NEGATIVE  NEGATIVE   Bilirubin Urine NEGATIVE  NEGATIVE   Ketones, ur NEGATIVE  NEGATIVE mg/dL   Protein, ur NEGATIVE  NEGATIVE mg/dL   Urobilinogen, UA 0.2  0.0 - 1.0 mg/dL   Nitrite NEGATIVE  NEGATIVE   Leukocytes, UA NEGATIVE  NEGATIVE   Comment: Performed at [facility]    Physical Findings: AIMS: Facial and Oral Movements Muscles of Facial Expression: None, normal Lips and Perioral Area: None,  normal Jaw: None, normal Tongue: None, normal,Extremity Movements Upper (arms, wrists, hands, fingers): None, normal Lower (legs, knees, ankles, toes): None, normal, Trunk Movements Neck, shoulders, hips: None, normal, Overall Severity Severity of abnormal movements (highest score from questions above): None, normal Incapacitation due to abnormal movements: None, normal Patient's awareness of abnormal movements (rate only patient's report): No Awareness, Dental Status Current problems with teeth and/or dentures?: No Does patient usually wear dentures?: No  CIWA:  CIWA-Ar Total: 1 COWS:  COWS Total Score: 3  Treatment Plan Summary: Daily contact with patient to assess and evaluate symptoms and progress in treatment Medication management  Plan: Will continue current plan and treatment.  1. Continue crisis management and stabilization. 2. Medication management to reduce current symptoms to base line and improve patient's overall level of functioning 3. Treat health problems as indicated. 4. Develop treatment plan to decrease risk of relapse upon discharge and the need for     readmission. 5. Psycho-social education regarding relapse prevention and self care. 6. Health care follow up as needed for medical problems. 7. Continue home medications where appropriate. 8. IM consult is called and Macrobid is started as well as Flomax. IM MD will see the patient. 9. Anticipate D/C Monday if no complications.   Medical Decision Making Problem Points:  Established problem, stable/improving (1), Review of last therapy session (1) and Review of psycho-social stressors (1) Data Points:  Review or order clinical lab tests (1) Review and summation of old records (2) Review of medication regiment & side effects (2) Lloyd Huger T. Latina Frank RPAC 1:32 PM 12/23/2012

## 2012-12-23 NOTE — ED Notes (Signed)
In route to [facility]

## 2012-12-23 NOTE — ED Notes (Signed)
Patient very anxious to get back to [facility]. Patient pleasant and appropriate.

## 2012-12-24 LAB — URINALYSIS, ROUTINE W REFLEX MICROSCOPIC
Bilirubin Urine: NEGATIVE
Hgb urine dipstick: NEGATIVE
Ketones, ur: NEGATIVE mg/dL
Specific Gravity, Urine: 1.019 (ref 1.005–1.030)
pH: 7.5 (ref 5.0–8.0)

## 2012-12-24 LAB — TSH: TSH: 2.009 u[IU]/mL (ref 0.350–4.500)

## 2012-12-24 MED ORDER — ARIPIPRAZOLE 2 MG PO TABS
2.0000 mg | ORAL_TABLET | Freq: Every day | ORAL | Status: DC
  Administered 2012-12-24 – 2012-12-28 (×5): 2 mg via ORAL
  Filled 2012-12-24 (×5): qty 1
  Filled 2012-12-24: qty 3
  Filled 2012-12-24 (×2): qty 1

## 2012-12-24 NOTE — Progress Notes (Signed)
D: Client sat in his room until bedtime in a chair and refused to go to wrap up group, "those are some pretty sad stories. I can't listen to them." Moderate confusion noted. Dinner eaten 50%.   A: Encouraged client to attend group and to increase PO intake. Offered alternatives for PO intake. Requested his "sleeping pill".   R: Client stated he would eat his dinner for breakfast, but it appears he said this to deter the fact that he's not eating. Client stated he was told by a staff member that he should not drink Ensure because that is what is calling him to be dizzy and unsteady. Informed client that this information would be clarified and that this was probably not the case. Sleep was poor this shift.

## 2012-12-24 NOTE — Progress Notes (Signed)
Recreation Therapy Notes   Date: 08.11.2014 Time: 3:00pm Location: [address] Dayroom  Group Topic: Stress Management  Goal Area(s) Addresses:  Patient will verbalize importance of using healthy stress management.  Patient will identify stress management technique of choice.  Behavioral Response: Did not attend  Latiana Tomei L Jadin Kagel, LRT/CTRS  Deadrick Stidd L 12/24/2012 4:19 PM 

## 2012-12-24 NOTE — Care Management Utilization Note (Signed)
   Per State Regulation 482.30  This chart was reviewed for necessity with respect to the patient's Admission/ Duration of stay.  Next review date:  12/27/12  Destany Severns Morrison RN, BSN 

## 2012-12-24 NOTE — Tx Team (Signed)
Interdisciplinary Treatment Plan Update   Date Reviewed:  12/24/2012  Time Reviewed:  9:36 AM  Progress in Treatment:   Attending groups: Yes Participating in groups: Yes Taking medication as prescribed: Yes  Tolerating medication: Yes Family/Significant other contact made:Yes, contact made with son Patient understands diagnosis: Yes  Discussing patient identified problems/goals with staff: Yes Medical problems stabilized or resolved: Yes Denies suicidal/homicidal ideation: Yes Patient has not harmed self or others: Yes  For review of initial/current patient goals, please see plan of care.  Estimated Length of Stay: 1 days  Reasons for Continued Hospitalization:  Anxiety Depression Medication stabilization   New Problems/Goals identified:    Discharge Plan or Barriers:   Home with outpatient follow up with [facility] [place]  Additional Comments: N/A  Attendees:  Patient:  12/24/2012 9:36 AM   Signature: Mervyn Gay, MD 12/24/2012 9:36 AM  Signature:  Verne Spurr, PA 12/24/2012 9:36 AM  Signature:  12/24/2012 9:36 AM  Signature: Neill Loft, RN 12/24/2012 9:36 AM  Signature:  Quintella Reichert, RN 12/24/2012 9:36 AM  Signature:  Juline Patch, LCSW 12/24/2012 9:36 AM  Signature:  Reyes Ivan, LCSW 12/24/2012 9:36 AM  Signature:  12/24/2012 9:36 AM  Signature: 12/24/2012 9:36 AM  Signature:    Signature:    Signature:      Scribe for Treatment Team:   Juline Patch,  12/24/2012 9:36 AM

## 2012-12-24 NOTE — Progress Notes (Signed)
D.  Pt. Denies SI/HI and denies A/V hallucination.  Pt. A fall risk.  Denies Dizziness.  Pt. Guarded. A.  Encouraged pt. To verbalize feelings and  Reviewed fall risk precautions. R.  Pt. Remains safe and reports that he gets up slowly when changing positions.

## 2012-12-24 NOTE — Progress Notes (Signed)
Patient ID: Edward Trevino, male   DOB: [birth year], 79 y.o.   MRN: [id number] D- Patient reports he did not sleep well.  He is using his walker and a wheelchair to ambulate depending in his need at the time.  He appears well groomed.today.  He has a call bell in place.  He  ate 50 % of his breakfast and lunch.  Today is his birthday and the kitchen staff made him a special cake.  He enjoyed having others sing Happy [place] Ouch and he ate a piece of the cake.  A- Reviewed fall precautions with patient.  R-He was able to name several precautions and "teach them back".  He expressed interest in learning more about assisted living.  York Spaniel that his son wants him to go home but "I'm going home to nothing."  Gave patient some information and offered to have Child psychotherapist talk with him further, but he wants to think about it.  Patient started on abilify. He says he needs something to help with all the thoughts racing in his head.  Patient declined to go to the cafeteria to eat.  He says that all the noise bothers him and he can't think and finds it distressing. Brought patient his lunch to the unit due to this.

## 2012-12-24 NOTE — Progress Notes (Signed)
Psychoeducational Group Note  Date:  12/23/2012 Time:  2000  Group Topic/Focus:  Wrap-Up Group:   The focus of this group is to help patients review their daily goal of treatment and discuss progress on daily workbooks.  Participation Level: Did Not Attend  Participation Quality:  Not Applicable  Affect:  Not Applicable  Cognitive:  Not Applicable  Insight:  Not Applicable  Engagement in Group: Not Applicable  Additional Comments:  The patient slept in his bedroom during group.   [name] S 12/24/2012, 12:43 AM

## 2012-12-24 NOTE — [facility] Group Notes (Signed)
[facility] LCSW Group Therapy          Overcoming Obstacles       1:15 -2:30        12/24/2012  3:06 PM    Type of Therapy:  Group Therapy  Participation Level:  Did not attend group.  Wynn Banker 12/24/2012   3:06 PM

## 2012-12-24 NOTE — Progress Notes (Signed)
Patient ID: Edward Trevino, male   DOB: [birth year], 79 y.o.   MRN: [id number] [facility] MD Progress Note  Edward Trevino  MRN:  [id number]  Subjective: Patient seen in his room today during group time. He was sent to the ED yesterday for tachycardia and hypotension and given IV fluids.  He was returned to the unit after evaluation.  Objective: Today is his 81th birthday. He appears more depressed, flat affected, and seems to be worsening. He states his family wants him to come home, but he does not appear to be able to take care of himself.  He says his family is taking his money and they want him to return home so they can continue to take his money.He states that he doesn't cook very well, and if he is not hungry he doesn't eat. He also endorses poor sleep here at [facility].  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  Patient is tolerating medication without adverse effects.    Psychiatric Specialty Exam: ROS   height is 6\' 3"  (1.905 m) and weight is 79.8 kg (175 lb 14.8 oz). His oral temperature is 98.5 F (36.9 C). His blood pressure is 81/51 and his pulse is 111. His respiration is 16 and oxygen saturation is 100%.  .  General Appearance: Disheveled  Eye Contact::  Fair  Speech:  Clear and Coherent  Volume:  Normal  Mood:  Depressed  Affect:  Constricted flat and incongruent with reports of decreasing depression.  Thought Process:  Linear  Orientation:  Full (Time, Place, and Person)  Thought Content:  WDL  Suicidal Thoughts:  Improving  Homicidal Thoughts:  No  Memory:  Immediate;   Fair  Judgement:  Impaired  Insight:  Lacking  Psychomotor Activity:  Decreased  Concentration:  Fair  Recall:  Fair  Akathisia:  No   Handed:  Right  AIMS (if indicated):     Assets:  Desire for Improvement  Sleep:  Number of Hours: 5.5   Current Medications: Current Facility-Administered Medications  Medication Dose Route Frequency Provider Last Rate Last Dose  . acetaminophen (TYLENOL) tablet 650 mg  650 mg Oral Q6H PRN Edward Hough, PA-C      . alum & mag hydroxide-simeth (MAALOX/MYLANTA) 200-200-20 MG/5ML suspension 30 mL  30 mL Oral Q4H PRN Edward Hough, PA-C      . diphenhydrAMINE (BENADRYL) capsule 25 mg  25 mg Oral QHS PRN Edward Hough, PA-C   25 mg at 12/21/12 2123  . escitalopram (LEXAPRO) tablet 10 mg  10 mg Oral Daily Edward Settle, MD   10 mg at 12/21/12 0839  . feeding supplement (ENSURE COMPLETE) liquid 237 mL  237 mL Oral BID BM Edward A Regalado, MD      . magnesium hydroxide (MILK OF MAGNESIA) suspension 30 mL  30 mL Oral Daily PRN Edward Hough, PA-C      . multivitamin with minerals tablet 1 tablet  1 tablet Oral Daily Edward Settle, MD   1 tablet at 12/21/12 [phone]  . pantoprazole (PROTONIX) EC tablet 20 mg  20 mg Oral BID AC Edward Settle, MD   20 mg at 12/22/12 0610  . tamsulosin (FLOMAX) capsule 0.4 mg  0.4  mg Oral Daily Edward Spurr, PA-C   0.4 mg at 12/21/12 1629    Lab Results:  Results for orders placed during the hospital encounter of 12/18/12 (from the past 48 hour(s))  URINALYSIS, DIPSTICK ONLY     Status: None   Collection Time    12/20/12  7:00 PM      Result Value Range   Specific Gravity, Urine 1.016  1.005 - 1.030   pH 6.0  5.0 - 8.0   Glucose, UA NEGATIVE  NEGATIVE mg/dL   Hgb urine dipstick NEGATIVE  NEGATIVE   Bilirubin Urine NEGATIVE  NEGATIVE   Ketones, ur NEGATIVE  NEGATIVE mg/dL   Protein, ur NEGATIVE  NEGATIVE mg/dL   Urobilinogen, UA 0.2  0.0 - 1.0 mg/dL   Nitrite NEGATIVE  NEGATIVE   Leukocytes, UA NEGATIVE  NEGATIVE   Comment: Performed at [facility]    Physical Findings: AIMS: Facial and Oral  Movements Muscles of Facial Expression: None, normal Lips and Perioral Area: None, normal Jaw: None, normal Tongue: None, normal,Extremity Movements Upper (arms, wrists, hands, fingers): None, normal Lower (legs, knees, ankles, toes): None, normal, Trunk Movements Neck, shoulders, hips: None, normal, Overall Severity Severity of abnormal movements (highest score from questions above): None, normal Incapacitation due to abnormal movements: None, normal Patient's awareness of abnormal movements (rate only patient's report): No Awareness, Dental Status Current problems with teeth and/or dentures?: No Does patient usually wear dentures?: No  CIWA:  CIWA-Ar Total: 1 COWS:  COWS Total Score: 3  Treatment Plan Summary: Daily contact with patient to assess and evaluate symptoms and progress in treatment Medication management  Plan: Will continue current plan and treatment. 1. Initiated Abilify 2mg  po qd as adjuvant therapy for depression. 2. Contacted his son to express concerns for this patient's ability to return home to live safely by himself due to his frailty, and poor motivation. 3. Will call for IM consult to discuss need for Assisted Living. Reviewed chart with Edward Trevino and will d/c Flomax to decrease his hypotension and encourage him to increase his fluid intake. 4. Will continue to monitor for depression.  Medical Decision Making Problem Points:  Established problem, stable/improving (1), Review of last therapy session (1) and Review of psycho-social stressors (1) Data Points:  Review or order clinical lab tests (1) Review and summation of old records (2) Review of medication regiment & side effects (2) Edward Trevino Edward Trevino 1:33 PM 12/24/2012    Reviewed the information documented and agree with the treatment plan.  Edward Trevino,Edward R. 12/24/2012 4:09 PM

## 2012-12-24 NOTE — Progress Notes (Signed)
Psychoeducational Group Note  Date:  12/24/2012 Time:  1100  Group Topic/Focus:  Self Care:   The focus of this group is to help patients understand the importance of self-care in order to improve or restore emotional, physical, spiritual, interpersonal, and financial health.  Participation Level: Did Not Attend  Participation Quality:  Not Applicable  Affect:  Not Applicable  Cognitive:  Not Applicable  Insight:  Not Applicable  Engagement in Group: Not Applicable  Additional Comments:  Patient did not attend group, patient remained in bed.  Karleen Hampshire Brittini 12/24/2012, 6:56 PM

## 2012-12-25 MED ORDER — CLONAZEPAM 1 MG PO TABS: 1.0000 mg | ORAL_TABLET | Freq: Every day | ORAL | Status: DC

## 2012-12-25 MED ORDER — CLONAZEPAM 0.5 MG PO TABS: 0.5000 mg | ORAL_TABLET | Freq: Two times a day (BID) | ORAL | Status: DC

## 2012-12-25 NOTE — Progress Notes (Signed)
D: Patient resting in bed with eyes closed.  Respirations even and unlabored.  Patient appears to be in no apparent distress. A: Staff to monitor Q 15 mins for safety.   R:Patient remains safe on the unit.  

## 2012-12-25 NOTE — Progress Notes (Signed)
Adult Psychoeducational Group Note  Date:  12/25/2012 Time:  1:06 PM  Group Topic/Focus:  Recovery Goals:   The focus of this group is to identify appropriate goals for recovery and establish a plan to achieve them.  Participation Level:  Minimal  Participation Quality:  Appropriate, Inattentive and Resistant  Affect:  Appropriate  Cognitive:  Appropriate  Insight: Improving and Lacking  Engagement in Group:  Improving and Lacking  Modes of Intervention:  Education  Additional Comments:  Pt. Did not want to participate in group. He held side conversations but did not want to answer questions. He did not stay for the whole group, he left half way through.   Sirr Kabel 12/25/2012, 1:06 PM

## 2012-12-25 NOTE — Progress Notes (Signed)
Agree with assessment and plan Jadaya Sommerfield A. Lindsay Straka, M.D. 

## 2012-12-25 NOTE — Progress Notes (Signed)
Agree with assessment and plan  Mansi Tokar A. Kasha Howeth. M.D. 

## 2012-12-25 NOTE — Progress Notes (Signed)
Recreation Therapy Notes  Date: 08.12.2014 Time: 2:45pm Location: [address] Dayroom  Group Topic: Animal Assisted Activities  Goal Area(s) Addresses:  Patient will interact appropriately with dog team.    Behavioral Response: Did not attend. Patient consent form indicated patient refused all animal assisted therapy services during admission.   Marykay Lex Deniesha Stenglein, LRT/CTRS  Jearl Klinefelter 12/25/2012 5:01 PM

## 2012-12-25 NOTE — Progress Notes (Signed)
Patient ID: Edward Trevino, male   DOB: 1931-08-11, 79 y.o.   MRN: [id number]  [facility] MD Progress Note 12/25/2012 Edward Trevino  MRN:  [id number]  Subjective: Patient was up in wheel chair on unit. He notes that he is here "live and in color!" with a brighter affect today. He reports that he is still not sleeping well and that his appetite is fair. Says he was up until 2:30AM due to his roommate having guests in the room until 10:30pm. Objective: He is alert and oriented x 3, states his sleep is poor because his daughter in law took away his sleep medication.Chart review indicates that he has been using Klonopin from his PCP. He reports his depression was at a 5/10 and his anxiety was a 5-6/10. 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  Patient is tolerating medication without adverse effects.    Psychiatric Specialty Exam: ROS   height is 6\' 3"  (1.905 m) and weight is 79.8 kg (175 lb 14.8 oz). His oral temperature is 98.8 F (37.1 C). His blood pressure is 68/38 and his pulse is 103. His respiration is 20 and oxygen saturation is 100%.  .  General Appearance: Disheveled  Eye Contact::  Fair  Speech:  Clear and Coherent  Volume:  Normal  Mood:  Depressed  Affect:  Constricted flat and incongruent with reports of decreasing depression.  Thought Process:  Linear  Orientation:  Full (Time, Place, and Person)  Thought Content:  WDL  Suicidal Thoughts:  Improving  Homicidal Thoughts:  No  Memory:  Immediate;   Fair  Judgement:  Impaired  Insight:  Lacking  Psychomotor Activity:  Decreased  Concentration:  Fair  Recall:  Fair  Akathisia:  No  Handed:  Right  AIMS (if indicated):     Assets:  Desire for Improvement   Sleep:  Number of Hours: 5.5   Current Medications: Current Facility-Administered Medications  Medication Dose Route Frequency Provider Last Rate Last Dose  . acetaminophen (TYLENOL) tablet 650 mg  650 mg Oral Q6H PRN Kerry Hough, PA-C      . alum & mag hydroxide-simeth (MAALOX/MYLANTA) 200-200-20 MG/5ML suspension 30 mL  30 mL Oral Q4H PRN Kerry Hough, PA-C      . diphenhydrAMINE (BENADRYL) capsule 25 mg  25 mg Oral QHS PRN Kerry Hough, PA-C   25 mg at 12/21/12 2123  . escitalopram (LEXAPRO) tablet 10 mg  10 mg Oral Daily Edward Settle, MD   10 mg at 12/21/12 0839  . feeding supplement (ENSURE COMPLETE) liquid 237 mL  237 mL Oral BID BM Belkys A Regalado, MD      . magnesium hydroxide (MILK OF MAGNESIA) suspension 30 mL  30 mL Oral Daily PRN Kerry Hough, PA-C      . multivitamin with minerals tablet 1 tablet  1 tablet Oral Daily Edward Settle, MD   1 tablet at 12/21/12 [phone]  . pantoprazole (PROTONIX) EC tablet 20 mg  20 mg Oral BID AC Edward Settle, MD   20 mg at 12/22/12 0610  . tamsulosin (FLOMAX) capsule 0.4 mg  0.4 mg Oral Daily Verne Spurr, PA-C   0.4 mg at 12/21/12 1629    Lab Results:  Results for orders placed during the hospital encounter of 12/18/12 (from the past 48 hour(s))  URINALYSIS, DIPSTICK ONLY     Status: None   Collection Time    12/20/12  7:00 PM      Result Value Range   Specific Gravity, Urine 1.016  1.005 - 1.030   pH 6.0  5.0 - 8.0   Glucose, UA NEGATIVE  NEGATIVE mg/dL   Hgb urine dipstick NEGATIVE  NEGATIVE   Bilirubin Urine NEGATIVE  NEGATIVE   Ketones, ur NEGATIVE  NEGATIVE mg/dL   Protein, ur NEGATIVE  NEGATIVE mg/dL   Urobilinogen, UA 0.2  0.0 - 1.0 mg/dL   Nitrite NEGATIVE  NEGATIVE   Leukocytes, UA NEGATIVE  NEGATIVE   Comment: Performed at [facility]    Physical Findings: AIMS: Facial and Oral Movements Muscles of Facial Expression: None, normal Lips and Perioral Area:  None, normal Jaw: None, normal Tongue: None, normal,Extremity Movements Upper (arms, wrists, hands, fingers): None, normal Lower (legs, knees, ankles, toes): None, normal, Trunk Movements Neck, shoulders, hips: None, normal, Overall Severity Severity of abnormal movements (highest score from questions above): None, normal Incapacitation due to abnormal movements: None, normal Patient's awareness of abnormal movements (rate only patient's report): No Awareness, Dental Status Current problems with teeth and/or dentures?: No Does patient usually wear dentures?: No  CIWA:  CIWA-Ar Total: 1 COWS:  COWS Total Score: 3  Treatment Plan Summary: Daily contact with patient to assess and evaluate symptoms and progress in treatment Medication management  Plan:  Will continue current plan and treatment. 1. Continue Abilify 2mg  po qd as adjuvant therapy for depression. 2. Contacted his son to express concerns for this patient's ability to return home to live safely by himself due to his frailty, and poor motivation. 3. Will add klonopin 0.5mg  po BID and 1mg  at hs. 4. CM will schedule Family meeting per MD to facilitate appropriate care upon discharge home. 5. Will follow.   Medical Decision Making Problem Points:  Established problem, stable/improving (1), Review of last therapy session (1) and Review of psycho-social stressors (1) Data Points:  Review or order clinical lab tests (1) Review and summation of old records (2) Review of medication regiment & side effects (2) Edward Trevino RPAC 2:16 PM 12/25/2012   Patient was evaluated, developed treatment and case discussed with treatment team. Reviewed the information documented and agree with the treatment plan.   Edward Trevino., M.D. 12/28/2012 8:38 PM

## 2012-12-25 NOTE — Progress Notes (Signed)
D: Patient in his room asleep in bed on approach.  Writer woke patient up and patient stated writer was interrupting his sleep.  Writer encouraged patient to drink more fluids. Patient did drink 8 oz of water at this time but refused to drink Gatorade. Patient states he ate all of his dinner and stated he drank most of his drink.  Patient pleasant but states "An old man needs his sleep."  Patient states' "I want to get the hell out of here so I can go Home!"  Writer talked to patient about his napping and going to bed early.  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  After speaking with Donell Sievert PA about patient having Klonopin tonight.  Patient is unsteady on his feet and has periods of confusion at times.  Klonopin discontinued and patient given prn benadryl for sleep per Karleen Hampshire.  A: Staff to monitor Q 15 mins for safety.  Encouragement and support offered. No Scheduled medications administered per orders. R: Patient remains safe on the unit.  Patient did not attend group tonight.  Patient taking administered medications.  Patient not visible on the unit tonight.

## 2012-12-25 NOTE — Progress Notes (Addendum)
Patient ID: Edward Trevino, male   DOB: 31-Jan-1932, 79 y.o.   MRN: [id number] D- Patient says he slept well last night, but that his roommate had company (his wife) in the room until 10 pm.  Told patient visiting hours ended at 7:30.  Patient said that he hears a lot of turmoil in his head.He is eating small amounts at every meal.  He tends to eat some and then go back and have more later.  He has been up in the wheelchair awhile and he walks to the bathroom.  A- encouraged and supported patient.  Asked patient if he needs anything and he responds "a few kind words" and some "TLC"  He is resting in bed a lot.  He did say that his stomach feels tender today and that he has had ulcers in the past.  He says he had 2 bowel movements today and neither was black looking.

## 2012-12-25 NOTE — [facility] Group Notes (Signed)
[facility] LCSW Group Therapy      Feelings About Diagnosis 1:15 - 2:30 PM         12/25/2012  2:24 PM    Type of Therapy:  Group Therapy  Participation Level:  Did not attend group.   Wynn Banker 12/25/2012 2:24 PM

## 2012-12-26 ENCOUNTER — Encounter (HOSPITAL_COMMUNITY): Payer: Self-pay | Admitting: Emergency Medicine

## 2012-12-26 DIAGNOSIS — I951 Orthostatic hypotension: Secondary | ICD-10-CM

## 2012-12-26 MED ORDER — SODIUM CHLORIDE 0.9 % IV BOLUS (SEPSIS)
1000.0000 mL | Freq: Once | INTRAVENOUS | Status: AC
  Administered 2012-12-26: 1000 mL via INTRAVENOUS

## 2012-12-26 NOTE — Progress Notes (Signed)
[id number] Edward Trevino 1932/05/06   Reviewed patient's vital signs this am. Patient continues to be orthostatic and had low grade temp at 99.1.  Consulted Dr. Carmela Rima who recommended ED for fluids and further evaluation of his orthostatic hypotension.  Patient will be transferred to [facility] for further evaluation and treatment of orthostatic hypotension.  12/26/2012 11:09 AM Lloyd Huger T. Mashburn RPAC 11:09 AM 12/26/2012  Reviewed the information documented and agree with the treatment plan.  Nehemiah Settle., M.D. 12/28/2012 8:39 PM

## 2012-12-26 NOTE — Progress Notes (Signed)
Pt observed in his room lying in bed awake.  Pt reports he has been trying to drink plenty of fluids since going to the ED for a IV fluid bolus.  Pt denies SI/HI/AV to this Clinical research associate.  Pt isolates to his room most of the time, so staff has to frequently encourage him to get out of bed and visit the dayroom to socialize.  He is appropriate with staff/peers.  Pt voices no needs or concerns at this time.  A pitcher of gatorade was provided to patient.  Support and encouragement offered.  Safety maintained with q15 minute checks.
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
## 2012-12-26 NOTE — [facility] Group Notes (Signed)
[facility] LCSW Aftercare Discharge Planning Group Note   12/26/2012 10:43 AM  Participation Quality:  Did not attend group.  Edward Trevino, Edward Trevino

## 2012-12-26 NOTE — ED Provider Notes (Signed)
CSN: [id number]     Arrival date & time 12/23/12  1243 History     First MD Initiated Contact with Patient 12/23/12 1300     Chief Complaint  Patient presents with  . Hypotension   (Consider location/radiation/quality/duration/timing/severity/associated sxs/prior Treatment) HPI Pt sent from [facility] for re-eval of persistent orthostasis, feels a little dizzy if he stands up too fast, but otherwise asymptomatic. Admits to poor PO fluid intake but has had good appetite for solid food. He was on Flomax but that was stopped a few days ago.   Past Medical History  Diagnosis Date  . Depression   . Insomnia   . Anxiety   . Suicide attempt   . Erosive esophagitis 07/25/2012  . Gastric out let obstruction 07/25/2012  . HOH (hard of hearing)    Past Surgical History  Procedure Laterality Date  . Appendectomy    . Back surgery    . Ercp N/A 07/24/2012    Dr. Jena Gauss. Significant abnormalities of the bowl and proximal second portion producing partial gastric outlet stricture and with secondary gastric dilation and severe erosive reflux esophagitis. Biopsy showed benign ulceration. Normal-appearing ampulla, status post biliary sphincterotomy and ampulla balloon dilation, stone extraction and stent placement.  Marland Kitchen Sphincterotomy N/A 07/24/2012    Procedure: SPHINCTEROTOMY;  Surgeon: Corbin Ade, MD;  Location: [facility];  Service: Endoscopy;  Laterality: N/A;  . Esophageal biopsy N/A 07/24/2012    Procedure: BIOPSY;  Surgeon: Corbin Ade, MD;  Location: [facility];  Service: Endoscopy;  Laterality: N/A;  Duodenal and Esophageal Biopsies  . Esophagogastroduodenoscopy N/A 07/24/2012    WUJ:[id number] ampulla s/p biliary sphincterotomy and ampullary  . Balloon dilation N/A 07/24/2012    Procedure: BALLOON DILATION;  Surgeon: Corbin Ade, MD;  Location: [facility];  Service: Endoscopy;  Laterality: N/A;  Balloon Stone Extraction, Drudging  . Removal of stones N/A 07/24/2012    Procedure: REMOVAL OF STONES;   Surgeon: Corbin Ade, MD;  Location: [facility];  Service: Endoscopy;  Laterality: N/A;  . Egd/ercp  10/18/2012    Dr. Jena Gauss: ;yloric channel stenosis, s/p dilation and bx, persisting CBD stones with extension of sphincterotomy and sphincterotomy balloon dilation and stone extraction. Removal of biliary stent  . Esophagogastroduodenoscopy (egd) with propofol N/A 10/18/2012    YQM:VHQIONG channel stenosis s/p dilation/Persisting common bile duct stones with extension of sphincterotomy     and sphincterotomy balloon dilation and stone extraction.  Biliary stent removal   . Ercp N/A 10/18/2012    Procedure: ENDOSCOPIC RETROGRADE CHOLANGIOPANCREATOGRAPHY (ERCP);  Surgeon: Corbin Ade, MD;  Location: [facility];  Service: Endoscopy;  Laterality: N/A;  . Removal of stones N/A 10/18/2012    Procedure: REMOVAL OF STONES;  Surgeon: Corbin Ade, MD;  Location: [facility];  Service: Endoscopy;  Laterality: N/A;  . Esophageal biopsy N/A 10/18/2012    Procedure: BIOPSY;  Surgeon: Corbin Ade, MD;  Location: [facility];  Service: Endoscopy;  Laterality: N/A;  . Cholecystectomy N/A 10/26/2012    Procedure: LAPAROSCOPIC CHOLECYSTECTOMY;  Surgeon: Dalia Heading, MD;  Location: [facility];  Service: General;  Laterality: N/A;  . Laparoscopy N/A 10/31/2012    Procedure: LAPAROSCOPY DIAGNOSTIC;  Surgeon: Fabio Bering, MD;  Location: [facility];  Service: General;  Laterality: N/A;   Family History  Problem Relation Age of Onset  . Colon cancer Neg Hx    History  Substance Use Topics  . Smoking status: Current Every Day Smoker    Types: Cigars  .  Smokeless tobacco: Never Used  . Alcohol Use: No     Comment: reported from ED son stated he has been drinking a lot of wine recently    Review of Systems All other systems reviewed and are negative except as noted in HPI.   Allergies  Review of patient's allergies indicates no known allergies.  Home Medications   Current Outpatient Rx  Name  Route  Sig  Dispense  Refill  .  clonazePAM (KLONOPIN) 0.5 MG tablet   Oral   Take 1 tablet (0.5 mg total) by mouth 3 (three) times daily. Takes one tablet twice daily and 2 tablets at bedtime.   90 tablet   0   . ENSURE PLUS (ENSURE PLUS) LIQD   Oral   Take 237 mLs by mouth 2 (two) times daily between meals.         . feeding supplement (RESOURCE BREEZE) LIQD   Oral   Take 1 Container by mouth 3 (three) times daily between meals.   20 Container   2   . megestrol (MEGACE) 40 MG tablet   Oral   Take 1 tablet (40 mg total) by mouth daily.   30 tablet   1   . Multiple Vitamin (MULTIVITAMIN WITH MINERALS) TABS   Oral   Take 1 tablet by mouth daily.         Marland Kitchen omeprazole (PRILOSEC) 20 MG capsule   Oral   Take 20 mg by mouth 2 (two) times daily.         . QUEtiapine (SEROQUEL) 100 MG tablet   Oral   Take 1 tablet (100 mg total) by mouth 3 (three) times daily.   90 tablet   0   . venlafaxine XR (EFFEXOR-XR) 75 MG 24 hr capsule   Oral   Take 1 capsule (75 mg total) by mouth daily.   30 capsule   0    BP 129/79  Pulse 75  Temp(Src) 98.9 F (37.2 C) (Oral)  Resp 12  Ht 6\' 3"  (1.905 m)  Wt 175 lb 14.8 oz (79.8 kg)  BMI 21.99 kg/m2  SpO2 98% Physical Exam  Nursing note and vitals reviewed. Constitutional: He is oriented to person, place, and time. He appears well-developed and well-nourished.  HENT:  Head: Normocephalic and atraumatic.  Eyes: EOM are normal. Pupils are equal, round, and reactive to light.  Neck: Normal range of motion. Neck supple.  Cardiovascular: Normal rate, normal heart sounds and intact distal pulses.   Pulmonary/Chest: Effort normal and breath sounds normal.  Abdominal: Bowel sounds are normal. He exhibits no distension. There is no tenderness.  Musculoskeletal: Normal range of motion. He exhibits no edema and no tenderness.  Neurological: He is alert and oriented to person, place, and time. He has normal strength. No cranial nerve deficit or sensory deficit.  Skin:  Skin is warm and dry. No rash noted.  Psychiatric: He has a normal mood and affect.    ED Course   Procedures (including critical care time)  Labs Reviewed  CBC WITH DIFFERENTIAL - Abnormal; Notable for the following:    RBC 3.72 (*)    Hemoglobin 11.5 (*)    HCT 34.3 (*)    All other components within normal limits  COMPREHENSIVE METABOLIC PANEL - Abnormal; Notable for the following:    Glucose, Bld 107 (*)    AST 39 (*)    Alkaline Phosphatase 155 (*)    GFR calc non Af Amer 65 (*)  GFR calc Af Amer 76 (*)    All other components within normal limits  URINALYSIS, DIPSTICK ONLY  MAGNESIUM  TSH  URINALYSIS, ROUTINE W REFLEX MICROSCOPIC  POCT I-STAT TROPONIN I   No results found. 1. MDD (major depressive disorder), single episode, severe , no psychosis   2. Major depressive disorder, single episode, severe, without mention of psychotic behavior   3. Abdominal pain   4. Adjustment disorder with mixed anxiety and depressed mood   5. Malnutrition of moderate degree   6. Urinary urgency   7. Dehydration   8. Hypotension     MDM   Filed Vitals:   12/26/12 0929 12/26/12 1122 12/26/12 1123 12/26/12 1300  BP: 115/73 122/74 103/64 129/79  Pulse: 76 79 96 75  Temp: 98.2 F (36.8 C)  98.2 F (36.8 C) 98.9 F (37.2 C)  TempSrc: Oral  Oral Oral  Resp:    12  Height:      Weight:      SpO2:    98%   Most recent orthostatic vitals from [facility] as above improved from yesterday but still has increased HR with standing. Will give IVF bolus and plan to return to [facility] for continued psychiatric care. Suspect his poor intake is at least partly due to his depression. Abdomen in benign. No indication for admission medically.   3:11 PM Pt given IVF, ate some crackers. Mild orthostasis due to poor intake not requiring medical admission. Return to [facility].  Ofelia Podolski B. Bernette Mayers, MD 12/26/12 [id number]

## 2012-12-26 NOTE — Progress Notes (Signed)
D: Patient pleasant and cooperative with staff and peers. Patient's affect/mood is appropriate to circumstance. He's interacting with peers in the milieu. Declined self inventory sheet today. Patient was sent out earlier to [facility] to receive fluids to address orthostatic hypotension; per report he tolerated procedure well and returned to [facility].   A: Support and encouragement provided to patient. Administered scheduled medications per ordering MD. Monitor Q15 minute checks for safety.  R: Patient receptive. Denies SI/HI/AVH. Patient remains safe on the unit.

## 2012-12-26 NOTE — Progress Notes (Signed)
Adult Psychoeducational Group Note  Date:  12/26/2012 Time:  1000AM Group Topic/Focus:  Therapeutic Activity  Participation Level:  Did Not Attend    Additional Comments:  Pt. Didn't attend group.   Bing Plume D 12/26/2012, 11:18 AM

## 2012-12-26 NOTE — ED Notes (Signed)
Pt brought over from [facility] with c/o hypotension and staff is requesting pt receive fluids.  Pt current BP 129/79

## 2012-12-26 NOTE — Progress Notes (Signed)
Recreation Therapy Notes  Date: 08.13.2014 Time: 3:00pm Location: 500 [address] Dayroom  Group Topic: Communication, Team Building, Problem Solving  Goal Area(s) Addresses:  Patient will be able to recognize use of communication, team building and problem solving during course of group activity. Patient will verbalize need for communication, team building and problem solving to make group activity successful.  Patient will verbalize benefit of communication, team building and problem solving to post d/c goals.   Behavioral Response: Did not attend.  Marykay Lex Jamariah Tony, LRT/CTRS  Alexei Doswell L 12/26/2012 5:20 PM

## 2012-12-27 ENCOUNTER — Ambulatory Visit (HOSPITAL_COMMUNITY): Payer: Self-pay | Admitting: Psychology

## 2012-12-27 LAB — COMPREHENSIVE METABOLIC PANEL
ALT: 27 U/L (ref 0–53)
AST: 28 U/L (ref 0–37)
CO2: 28 mEq/L (ref 19–32)
Calcium: 9.3 mg/dL (ref 8.4–10.5)
Chloride: 108 mEq/L (ref 96–112)
GFR calc Af Amer: 64 mL/min — ABNORMAL LOW (ref >90)
GFR calc non Af Amer: 55 mL/min — ABNORMAL LOW (ref >90)
Glucose, Bld: 103 mg/dL — ABNORMAL HIGH (ref 70–99)
Sodium: 142 mEq/L (ref 135–145)
Total Bilirubin: 0.2 mg/dL — ABNORMAL LOW (ref 0.3–1.2)
Total Protein: 6.3 g/dL (ref 6.0–8.3)

## 2012-12-27 MED ORDER — CLONAZEPAM 1 MG PO TABS
1.0000 mg | ORAL_TABLET | Freq: Every day | ORAL | Status: DC
  Administered 2012-12-27: 1 mg via ORAL
  Filled 2012-12-27: qty 1
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
## 2012-12-27 NOTE — Progress Notes (Signed)
Adult Psychoeducational Group Note  Date:  12/27/2012 Time:  3:40 PM  Group Topic/Focus:  Overcoming Stress:   The focus of this group is to define stress and help patients assess their triggers.  Participation Level:  Did Not Attend  Participation Quality:    Affect:    Cognitive:    Insight: None  Engagement in Group:  None  Modes of Intervention:    Additional Comments:    Reynolds Bowl 12/27/2012, 3:40 PM

## 2012-12-27 NOTE — Tx Team (Signed)
Interdisciplinary Treatment Plan Update   Date Reviewed:  12/27/2012  Time Reviewed:  9:21 AM  Progress in Treatment:   Attending groups: Yes Participating in groups: Yes Taking medication as prescribed: Yes  Tolerating medication: Yes Family/Significant other contact made:Yes, Multiple contacts made with son Patient understands diagnosis: Yes  Discussing patient identified problems/goals with staff: Yes Medical problems stabilized or resolved: Yes Denies suicidal/homicidal ideation: Yes Patient has not harmed self or others: Yes  For review of initial/current patient goals, please see plan of care.  Estimated Length of Stay: 1- 2 days  Reasons for Continued Hospitalization:  Anxiety Depression Medication stabilization  New Problems/Goals identified:    Discharge Plan or Barriers:   Home with outpatient follow up with [facility] [place]  Additional Comments: Patient agreeable to have someone come into the home to monitor his safety for the next 30 days.   Attendees:  Patient:  Edward Trevino 12/27/2012 9:21 AM   Signature: Mervyn Gay, MD 12/27/2012 9:21 AM  Signature:  Verne Spurr, PA 12/27/2012 9:21 AM  Signature:  Koltin Wehmeyer, Son 12/27/2012 9:21 AM  Signature:  Sheralyn Boatman, Daughter -in-law 12/27/2012 9:21 AM  Signature:  Quintella Reichert, RN 12/27/2012 9:21 AM  Signature:  Juline Patch, LCSW 12/27/2012 9:21 AM  Signature:  Cecile Hearing, PA-S 12/27/2012 9:21 AM  Signature:  12/27/2012 9:21 AM  Signature: 12/27/2012 9:21 AM  Signature:    Signature:    Signature:      Scribe for Treatment Team:   Juline Patch,  12/27/2012 9:21 AM

## 2012-12-27 NOTE — Progress Notes (Signed)
Patient ID: Edward Trevino, male   DOB: [birth year], 79 y.o.   MRN: [id number]  [facility] MD Progress Note 12/25/2012 Edward Trevino  MRN:  [id number]  Subjective: Patient was up in wheel chair on unit. Patient and family in together to discuss his care when he goes home.   Objective: He is alert and oriented and able to articulate his needs and desires. He does not want to go to a SNF or an assisted living facility. He is resistant to having someone in his home 24/7.  He does not like the two gentleman who currently come in to assist him.  He is able to articulate that he will have a medical professional to come into his home and that his son and daughter in law will assist him in the interviews for this position.    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  Patient is tolerating medication without adverse effects.    Psychiatric Specialty Exam: ROS  Review of Systems  Constitutional: Negative.  Negative for fever, chills, weight loss, malaise/fatigue and diaphoresis.  HENT: Negative for congestion and sore throat.   Eyes: Negative for blurred vision, double vision and photophobia.  Respiratory: Negative for cough, shortness of breath and wheezing.   Cardiovascular: Negative for chest pain, palpitations and PND.  Gastrointestinal: Negative for heartburn, nausea, vomiting, abdominal pain, diarrhea and constipation.  Musculoskeletal: Negative for myalgias, joint pain and falls.  Neurological: Negative for dizziness, tingling, tremors, sensory change, speech change, focal weakness, seizures, loss of consciousness, weakness and headaches.  Endo/Heme/Allergies: Negative for polydipsia. Does not bruise/bleed easily.   Psychiatric/Behavioral: Negative for depression, suicidal ideas, hallucinations, memory loss and substance abuse. The patient is not nervous/anxious and does not have insomnia.       height is 6\' 3"  (1.905 m) and weight is 79.8 kg (175 lb 14.8 oz). His oral temperature is 98.2 F (36.8 C). His blood pressure is 145/80 and his pulse is 83. His respiration is 18 and oxygen saturation is 100%.    General Appearance: Disheveled  Eye Solicitor::  Fair  Speech:  Clear and Coherent  Volume:  Normal  Mood:  Depressed  Affect:  Constricted flat and incongruent with reports of decreasing depression.  Thought Process:  Linear  Orientation:  Full (Time, Place, and Person)  Thought Content:  WDL  Suicidal Thoughts:  Improving  Homicidal Thoughts:  No  Memory:  Immediate;   Fair  Judgement:  Impaired  Insight:  Lacking  Psychomotor Activity:  Decreased  Concentration:  Fair  Recall:  Fair  Akathisia:  No  Handed:  Right  AIMS (if indicated):     Assets:  Desire for Improvement  Sleep:  Number of Hours: 5.5   Current Medications: Current Facility-Administered Medications  Medication Dose Route Frequency Provider Last Rate Last Dose  . acetaminophen (TYLENOL) tablet 650 mg  650 mg Oral Q6H PRN Kerry Hough, PA-C      . alum & mag hydroxide-simeth (MAALOX/MYLANTA) 200-200-20 MG/5ML suspension 30 mL  30 mL Oral Q4H PRN Kerry Hough, PA-C      . diphenhydrAMINE (BENADRYL) capsule 25 mg  25 mg Oral QHS PRN Kerry Hough, PA-C   25 mg at 12/21/12 2123  .  escitalopram (LEXAPRO) tablet 10 mg  10 mg Oral Daily Nehemiah Settle, MD   10 mg at 12/21/12 0839  . feeding supplement (ENSURE COMPLETE) liquid 237 mL  237 mL Oral BID BM Belkys A Regalado, MD      . magnesium hydroxide (MILK OF MAGNESIA) suspension 30 mL  30 mL Oral Daily PRN Kerry Hough, PA-C      . multivitamin with minerals tablet 1 tablet  1 tablet Oral Daily Nehemiah Settle, MD   1 tablet at 12/21/12 [id number]  .  pantoprazole (PROTONIX) EC tablet 20 mg  20 mg Oral BID AC Nehemiah Settle, MD   20 mg at 12/22/12 0610  . tamsulosin (FLOMAX) capsule 0.4 mg  0.4 mg Oral Daily Verne Spurr, PA-C   0.4 mg at 12/21/12 1629    Lab Results:  Results for orders placed during the hospital encounter of 12/18/12 (from the past 48 hour(s))  URINALYSIS, DIPSTICK ONLY     Status: None   Collection Time    12/20/12  7:00 PM      Result Value Range   Specific Gravity, Urine 1.016  1.005 - 1.030   pH 6.0  5.0 - 8.0   Glucose, UA NEGATIVE  NEGATIVE mg/dL   Hgb urine dipstick NEGATIVE  NEGATIVE   Bilirubin Urine NEGATIVE  NEGATIVE   Ketones, ur NEGATIVE  NEGATIVE mg/dL   Protein, ur NEGATIVE  NEGATIVE mg/dL   Urobilinogen, UA 0.2  0.0 - 1.0 mg/dL   Nitrite NEGATIVE  NEGATIVE   Leukocytes, UA NEGATIVE  NEGATIVE   Comment: Performed at [facility]    Physical Findings: AIMS: Facial and Oral Movements Muscles of Facial Expression: None, normal Lips and Perioral Area: None, normal Jaw: None, normal Tongue: None, normal,Extremity Movements Upper (arms, wrists, hands, fingers): None, normal Lower (legs, knees, ankles, toes): None, normal, Trunk Movements Neck, shoulders, hips: None, normal, Overall Severity Severity of abnormal movements (highest score from questions above): None, normal Incapacitation due to abnormal movements: None, normal Patient's awareness of abnormal movements (rate only patient's report): No Awareness, Dental Status Current problems with teeth and/or dentures?: No Does patient usually wear dentures?: No  CIWA:  CIWA-Ar Total: 1 COWS:  COWS Total Score: 3  Treatment Plan Summary: Daily contact with patient to assess and evaluate symptoms and progress in treatment Medication management  Plan:  Will continue current plan and treatment. 1. Continue Abilify5mg  po qd as adjuvant therapy for depression. 2. Contacted his son to express concerns for this  patient's ability to return home to live safely by himself due to his frailty, and poor motivation. 3. Will Continue Klonopin 1mg  at hs. 4. CM continue to facilitate after care for when he is discharged. 5. Will follow orthostatics are improving today, but will get CMP for this evening. 6. Will anticipate D/C home Friday. 7. Family understands that they will distribute the medication the way it is written by the physician and patient hears this and understands this as well.  Medical Decision Making Problem Points:  Established problem, stable/improving (1), Review of last therapy session (1) and Review of psycho-social stressors (1) Data Points:  Review or order clinical lab tests (1) Review and summation of old records (2) Review of medication regiment & side effects (2) Lloyd Huger T. Mashburn [facility] 12/27/2012 3:32 PM  Reviewed the information documented and agree with the treatment plan.  Brentin Shin,JANARDHAHA R. 12/28/2012 8:42 PM

## 2012-12-27 NOTE — [facility] Group Notes (Addendum)
[facility] LCSW Group Therapy  [facility] 1:15 - 2:30 PM  12/27/2012 2:41 PM   Type of Therapy:  Group Therapy  Participation Level:  Minimal  Participation Quality:  Attentive  Affect:  Depressed Flat  Cognitive:  Appropriate  Insight:  Limited  Engagement in Therapy:  Limited  Modes of Intervention:  Discussion, Education, Exploration, Problem-Solving, Rapport Building, Support   Summary of Progress/Problems:  Patient left group before presentation was completed.  Wynn Banker 12/27/2012 2:41 PM

## 2012-12-27 NOTE — Progress Notes (Addendum)
D: a Patient's self invenory sheet, has poor sleep, good appetite, low energy level, improving attention span, low energy level, improving attention span.  Denied depression, hopelessness and anxiety.  Denied withdrawals.  Denied SI.  Denied physical problems.  Plans to go home after discharge.  No problems taking meds after discharge. A:  Medications administered per MD orders.  Emotional support and encouragement given patient. R:  Denied SI and HI.  Denied A/V hallucinations.  Denied pain.  Has heartache for an old friend who is with the Lord.  Plans to have somelone help him after discharge. "meetings shake me up."    Patient's family visited this morning, talked to MD/SW.   Patient stated when he is discharged that he will have person to stay and help him approximately one month and he is thankful for this assistance.

## 2012-12-27 NOTE — Progress Notes (Signed)
Recreation Therapy Notes  Date: 08.14.2014 Time: 2:45pm Location: [address] Dayroom   Group Topic: Animal Assisted Activities (AAA)  Goal Area(s) Addresses:  Patient will interact appropriately with dog team.    Behavioral Response: Did not attend. Patient consent form indicates patient refused all AAA services during admission.   Marykay Lex Ramesh Moan, LRT/CTRS  Jearl Klinefelter 12/27/2012 7:34 PM

## 2012-12-27 NOTE — Progress Notes (Signed)
Patient did not attend 0900 nurse orientation group meeting.  Patient was in family meeting with SW.

## 2012-12-27 NOTE — Care Management Utilization Note (Signed)
   Per State Regulation 482.30  This chart was reviewed for necessity with respect to the patient's Admission/ Duration of stay.  Next review date: 12/31/12  Nicolasa Ducking RN, BSN

## 2012-12-28 MED ORDER — ESCITALOPRAM OXALATE 10 MG PO TABS: 10.0000 mg | ORAL_TABLET | Freq: Every day | ORAL | Status: DC

## 2012-12-28 MED ORDER — CLONAZEPAM 1 MG PO TABS: 1.0000 mg | ORAL_TABLET | Freq: Every day | ORAL | Status: DC

## 2012-12-28 MED ORDER — ARIPIPRAZOLE 2 MG PO TABS: 2.0000 mg | ORAL_TABLET | Freq: Every day | ORAL | Status: DC

## 2012-12-28 NOTE — Progress Notes (Signed)
Discharge Note: Discharge instructions/prescriptions/medication samples given to patient. Patient verbalized understanding of discharge instructions and prescriptions. Returned belongings to patient. Denies SI/HI/AVH. Patient d/c without incident.

## 2012-12-28 NOTE — Progress Notes (Signed)
[facility] Adult Case Management Discharge Plan :  Will you be returning to the same living situation after discharge: Yes,  Patient will return home with family. At discharge, do you have transportation home?:Yes,  Family will transport him home. Do you have the ability to pay for your medications:Yes,  Patient is able to afford medications.  Release of information consent forms completed and in the chart;  Patient's signature needed at discharge.  Patient to Follow up at: Follow-up Information   Follow up with Dr. Annitta Needs - [facility] On 01/15/2013. (You are scheduled with Dr. Tenny Craw on Tuesday, January 15, 2013 for medication managment)    Contact information:   [address] [place], [place]  [phone]      Follow up with Dr. Arlina Robes [facility] On 01/02/2013. (You are scheduled with Dr. Houston Siren on Wednesday, January 02, 2013 at 2:45 PM for counseling)    Contact information:   [address] [place], [place]   [address]  [phone]      Patient denies SI/HI:   Patient will no longer endorse SI/other thought self harm     Safety Planning and Suicide Prevention discussed:  .Reviewed with all patients during discharge planning group    Gayna Braddy, [name] 12/28/2012, 9:51 AM

## 2012-12-28 NOTE — Progress Notes (Signed)
Patient ID: Edward Trevino, male   DOB: 13-Mar-1932, 79 y.o.   MRN: [id number]  D: Took over patient's care @ 2330. Patient in bed sleeping. Respiration regular and unlabored. No sign of distress noted at this time A: 15 mins checks for safety. R: Patient  is safe.

## 2012-12-28 NOTE — [facility] Suicide Risk Assessment (Signed)
Suicide Risk Assessment  Discharge Assessment     Demographic Factors:  Male, Age 79 or older, Caucasian and Living alone  Mental Status Per Nursing Assessment::   On Admission:     Current Mental Status by Physician: Mental Status Examination: Patient appeared as per his stated age, casually dressed, and fairly groomed, and maintaining good eye contact. Patient has good mood and his affect was constricted. He has normal rate, rhythm, and volume of speech. His thought process is linear and goal directed. Patient has denied suicidal, homicidal ideations, intentions or plans. Patient has no evidence of auditory or visual hallucinations, delusions, and paranoia. Patient has fair insight judgment and impulse control.  Loss Factors: Decline in physical health and Financial problems/change in socioeconomic status  Historical Factors: Prior suicide attempts and Impulsivity  Risk Reduction Factors:   Sense of responsibility to family, Religious beliefs about death, Positive social support, Positive therapeutic relationship and Positive coping skills or problem solving skills  Continued Clinical Symptoms:  Depression:   Recent sense of peace/wellbeing Unstable or Poor Therapeutic Relationship Previous Psychiatric Diagnoses and Treatments Medical Diagnoses and Treatments/Surgeries  Cognitive Features That Contribute To Risk:  Polarized thinking    Suicide Risk:  Minimal: No identifiable suicidal ideation.  Patients presenting with no risk factors but with morbid ruminations; may be classified as minimal risk based on the severity of the depressive symptoms  Discharge Diagnoses:   AXIS I:  Anxiety Disorder NOS and Major Depression, Recurrent severe AXIS II:  Deferred AXIS III:   Past Medical History  Diagnosis Date  . Depression   . Insomnia   . Anxiety   . Suicide attempt   . Erosive esophagitis 07/25/2012  . Gastric out let obstruction 07/25/2012  . HOH (hard of hearing)    AXIS  IV:  other psychosocial or environmental problems, problems related to social environment and problems with primary support group AXIS V:  61-70 mild symptoms  Plan Of Care/Follow-up recommendations:  Activity:  As tolerated Diet:  Regular  Is patient on multiple antipsychotic therapies at discharge:  No   Has Patient had three or more failed trials of antipsychotic monotherapy by history:  No  Recommended Plan for Multiple Antipsychotic Therapies: Not applicable  Nehemiah Settle., M.D. 12/28/2012, 12:08 PM

## 2012-12-28 NOTE — Clinical Social Work Note (Signed)
Writer returned call to patient's son who advised he would not be able to pick patient up for discharge this weekend.  He had advised writer earlier that he would not have sitters available until next week.  Writer informed NP of conversation with patient's son.  NP called son's wife who agreed to take care of patient until they could get sitters in place next week.  Son advised Clinical research associate that both he and his wife would be working this weekend and can not provide care this weekend.  He advised they can pick patient up but there will be no sitters in place.  Son was upset that he had been advised of discharge today and stated he had less than five hours to get things in place.  He was reminded that the discussion of discharging today had taken place during the meeting yesterday. NP advised of call from son stating they can pick patient up but he will not have sitters in place.  NP advised we will have to discharge patient without sitters being in place.

## 2012-12-28 NOTE — [facility] Group Notes (Signed)
[facility] LCSW Group Therapy  Feelings Around Relapse 1:15 -2:30        12/28/2012     Type of Therapy:  Group Therapy  Participation Level: Did not attend group.  Wynn Banker 12/28/2012

## 2012-12-28 NOTE — Progress Notes (Signed)
Adult Psychoeducational Group Note  Date:  12/28/2012 Time:  11:39 AM  Group Topic/Focus:  Therapeutic Activity  Participation Level:  Active  Participation Quality:  Appropriate  Affect:  Appropriate  Cognitive:  Appropriate  Insight: Appropriate  Engagement in Group:  Engaged  Modes of Intervention:  Discussion   Elijio Miles 12/28/2012, 11:39 AM

## 2012-12-28 NOTE — Progress Notes (Signed)
Adult Psychoeducational Group Note  Date:  12/28/2012 Time:  5:16 PM  Group Topic/Focus:  Relapse Prevention Planning:   The focus of this group is to define relapse and discuss the need for planning to combat relapse.  Participation Level:  Did Not Attend   Caswell Corwin 12/28/2012, 5:16 PM

## 2012-12-28 NOTE — Tx Team (Signed)
Interdisciplinary Treatment Plan Update   Date Reviewed:  12/28/2012  Time Reviewed:  10:48 AM  Progress in Treatment:   Attending groups: Yes Participating in groups: Yes Taking medication as prescribed: Yes  Tolerating medication: Yes Family/Significant other contact made:Yes, Multiple contacts made with son Patient understands diagnosis: Yes  Discussing patient identified problems/goals with staff: Yes Medical problems stabilized or resolved: Yes Denies suicidal/homicidal ideation: Yes Patient has not harmed self or others: Yes  For review of initial/current patient goals, please see plan of care.  Estimated Length of Stay: Discharge home today.  Reasons for Continued Hospitalization:   New Problems/Goals identified:    Discharge Plan or Barriers:   Home with outpatient follow up with [facility] -   Additional Comments:  N/A  Attendees:  Patient:  Edward Trevino 12/28/2012 10:48 AM   Signature: Mervyn Gay, MD 12/28/2012 10:48 AM  Signature:  Verne Spurr, PA 12/28/2012 10:48 AM  Signature:  Seabron Spates, RN 12/28/2012 10:48 AM  Signature:  Lamount Cranker, RN  12/28/2012 10:48 AM  Signature:  12/28/2012 10:48 AM  Signature:  Juline Patch, LCSW 12/28/2012 10:48 AM  Signature: Reyes Ivan, LCSW-A 12/28/2012 10:48 AM  Signature:  12/28/2012 10:48 AM  Signature: 12/28/2012 10:48 AM  Signature:    Signature:    Signature:      Scribe for Treatment Team:   Juline Patch,  12/28/2012 10:48 AM

## 2012-12-28 NOTE — [facility] Group Notes (Signed)
[facility] LCSW Aftercare Discharge Planning Group Note   12/28/2012 9:49 AM  Participation Quality:  Did not attend group.  Kalyani Maeda, [name]
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
## 2013-01-01 NOTE — Progress Notes (Signed)
Patient Discharge Instructions:  Next Level Care Provider Has Access to the EMR, 01/01/13 Records provided to [facility] via [facility]/Epic access.  Jerelene Redden, 01/01/2013, 1:58 PM

## 2013-01-02 ENCOUNTER — Ambulatory Visit (INDEPENDENT_AMBULATORY_CARE_PROVIDER_SITE_OTHER): Payer: Medicare Other | Admitting: Psychology

## 2013-01-02 DIAGNOSIS — F332 Major depressive disorder, recurrent severe without psychotic features: Secondary | ICD-10-CM

## 2013-01-09 NOTE — Clinical Documentation Improvement (Signed)
Patient has failure to thrive and malnutrition.

## 2013-01-15 ENCOUNTER — Ambulatory Visit (HOSPITAL_COMMUNITY): Payer: Medicare Other | Admitting: Psychiatry

## 2013-01-18 ENCOUNTER — Ambulatory Visit (HOSPITAL_COMMUNITY): Payer: Self-pay | Admitting: Psychology

## 2013-01-27 ENCOUNTER — Other Ambulatory Visit (HOSPITAL_COMMUNITY): Payer: Self-pay | Admitting: Physician Assistant

## 2013-02-05 ENCOUNTER — Other Ambulatory Visit (HOSPITAL_COMMUNITY): Payer: Self-pay | Admitting: Physician Assistant

## 2013-02-25 ENCOUNTER — Encounter (HOSPITAL_COMMUNITY): Payer: Self-pay | Admitting: Psychology
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
## 2013-04-26 ENCOUNTER — Encounter (HOSPITAL_COMMUNITY): Payer: Self-pay | Admitting: Emergency Medicine

## 2013-04-26 ENCOUNTER — Encounter (HOSPITAL_COMMUNITY): Payer: Self-pay

## 2013-04-26 ENCOUNTER — Emergency Department (HOSPITAL_COMMUNITY)
Admission: EM | Admit: 2013-04-26 | Discharge: 2013-04-26 | Disposition: A | Discharge status: Other Acute Inpatient Hospital | Payer: Medicare Other | Source: Home / Self Care | Attending: Emergency Medicine | Admitting: Emergency Medicine

## 2013-04-26 ENCOUNTER — Inpatient Hospital Stay (HOSPITAL_COMMUNITY)
Admission: AD | Admit: 2013-04-26 | Discharge: 2013-04-30 | DRG: 885 | Disposition: A | Discharge status: Home / Self Care | Payer: Medicare Other | Attending: Psychiatry | Admitting: Psychiatry

## 2013-04-26 DIAGNOSIS — F418 Other specified anxiety disorders: Secondary | ICD-10-CM

## 2013-04-26 DIAGNOSIS — F332 Major depressive disorder, recurrent severe without psychotic features: Principal | ICD-10-CM | POA: Diagnosis present

## 2013-04-26 DIAGNOSIS — F329 Major depressive disorder, single episode, unspecified: Secondary | ICD-10-CM

## 2013-04-26 DIAGNOSIS — Z79899 Other long term (current) drug therapy: Secondary | ICD-10-CM

## 2013-04-26 DIAGNOSIS — G4700 Insomnia, unspecified: Secondary | ICD-10-CM | POA: Diagnosis present

## 2013-04-26 DIAGNOSIS — F32A Depression, unspecified: Secondary | ICD-10-CM

## 2013-04-26 DIAGNOSIS — R45851 Suicidal ideations: Secondary | ICD-10-CM

## 2013-04-26 DIAGNOSIS — F419 Anxiety disorder, unspecified: Secondary | ICD-10-CM

## 2013-04-26 DIAGNOSIS — F411 Generalized anxiety disorder: Secondary | ICD-10-CM | POA: Diagnosis present

## 2013-04-26 LAB — COMPREHENSIVE METABOLIC PANEL
ALT: 14 U/L (ref 0–53)
AST: 20 U/L (ref 0–37)
Albumin: 3.8 g/dL (ref 3.5–5.2)
Alkaline Phosphatase: 87 U/L (ref 39–117)
BUN: 13 mg/dL (ref 6–23)
Chloride: 104 mEq/L (ref 96–112)
GFR calc Af Amer: 67 mL/min — ABNORMAL LOW (ref >90)
Glucose, Bld: 85 mg/dL (ref 70–99)
Potassium: 4 mEq/L (ref 3.5–5.1)
Sodium: 138 mEq/L (ref 135–145)
Total Bilirubin: 0.3 mg/dL (ref 0.3–1.2)
Total Protein: 7 g/dL (ref 6.0–8.3)

## 2013-04-26 LAB — RAPID URINE DRUG SCREEN, HOSP PERFORMED
Amphetamines: NOT DETECTED
Barbiturates: NOT DETECTED
Opiates: NOT DETECTED
Tetrahydrocannabinol: NOT DETECTED

## 2013-04-26 LAB — CBC WITH DIFFERENTIAL/PLATELET
Basophils Absolute: 0 10*3/uL (ref 0.0–0.1)
Basophils Relative: 0 % (ref 0–1)
Eosinophils Absolute: 0.1 10*3/uL (ref 0.0–0.7)
Hemoglobin: 12 g/dL — ABNORMAL LOW (ref 13.0–17.0)
MCH: 32.9 pg (ref 26.0–34.0)
MCHC: 35.3 g/dL (ref 30.0–36.0)
Monocytes Relative: 8 % (ref 3–12)
Neutro Abs: 4.3 10*3/uL (ref 1.7–7.7)
Neutrophils Relative %: 62 % (ref 43–77)
Platelets: 232 10*3/uL (ref 150–400)
RDW: 12.6 % (ref 11.5–15.5)

## 2013-04-26 MED ORDER — MAGNESIUM HYDROXIDE 400 MG/5ML PO SUSP: 30.0000 mL | Freq: Every day | ORAL | Status: DC | PRN

## 2013-04-26 MED ORDER — ALUM & MAG HYDROXIDE-SIMETH 200-200-20 MG/5ML PO SUSP: 30.0000 mL | ORAL | Status: DC | PRN

## 2013-04-26 MED ORDER — HYDROXYZINE HCL 50 MG PO TABS
50.0000 mg | ORAL_TABLET | Freq: Every evening | ORAL | Status: DC | PRN
  Administered 2013-04-26 – 2013-04-29 (×4): 50 mg via ORAL
  Filled 2013-04-26 (×4): qty 1

## 2013-04-26 MED ORDER — ACETAMINOPHEN 325 MG PO TABS: 650.0000 mg | ORAL_TABLET | Freq: Four times a day (QID) | ORAL | Status: DC | PRN
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
## 2013-04-26 NOTE — ED Notes (Signed)
Bed: [facility] Expected date:  Expected time:  Means of arrival:  Comments: Medical clearance

## 2013-04-26 NOTE — Progress Notes (Signed)
D) 79 year old voluntarily adimtted to the service of Dr. Elsie Saas. Pt reports that he is feeling depressed and has been since before Thanksgiving. States he spent the holiday alone. Affect is flat and mood depressed. Has two dogs that he loves. "Darling and Ginger".  Pt states he lives for them. Reports that he is quite depressed and would like to die. No plan. A) Given support and reassurance along with praise for coming to the hospital. Pt provided with a wheelchair. R) contracts for safety on the unit.

## 2013-04-26 NOTE — BH Assessment (Signed)
Writer discussed clincials with Dr. Dub Mikes and he agreed to accept this patient to the 500 hall. Patient officially accepted by Dr. Geoffery Lyons to Dr. Henrene Hawking bed 500-1. Pt sent to the [facility] for medical clearance. Once cleared patient may return back to [facility] for a inpatient admission.   Writer coordinated transportation with Faythe Dingwall with Baxter Hire, LCSW at [facility] to inform her that patient would be coming over for medical clearance. Writer also contacted [employer] charge nurse Stacy regarding this patient's disposition.

## 2013-04-26 NOTE — ED Notes (Addendum)
Pt brought over here from [facility] for depression. Pt has a bed at [facility] just sent here for medical clearance and then can go back to [facility].  Pt states that he lives alone with his two dogs and sometimes when  You are alone you have thoughts.

## 2013-04-26 NOTE — ED Provider Notes (Signed)
Medical screening examination/treatment/procedure(s) were conducted as a shared visit with non-physician practitioner(s) and myself.  I personally evaluated the patient during the encounter.  EKG Interpretation   None       Patient presents for a progressively worsening depression. Depression is accompanied by auditory hallucinations. Medical clearance labs performed. Patient will require psychiatric evaluation for possible inpatient admission to psychiatric facility.  Gilda Crease, MD 04/26/13 [phone]

## 2013-04-26 NOTE — ED Provider Notes (Signed)
CSN: [id number]     Arrival date & time 04/26/13  1511 History  This chart was scribed for non-physician practitioner, Fayrene Helper, PA-C working with Gilda Crease, MD by Greggory Stallion, ED scribe. This patient was seen in room WLCON/WLCON and the patient's care was started at 3:32 PM.   Chief Complaint  Patient presents with  . Medical Clearance   The history is provided by the patient. No language interpreter was used.   HPI Comments: Edward Trevino is a 79 y.o. male who presents to the Emergency Department for medical clearance. He was brought here from [facility] for depression. He states his psychologist referred him to [facility]. Pt has been dealing with this depression for the last 2.5 weeks. He states it was brought on by family not being supportive. Denies SI/HI. He states he has audio hallucinations of people trying to get him. Pt states he only hears this when it gets dark. He states he currently has a headache but denies having any other complaints currently. Denies having access to guns.   Past Medical History  Diagnosis Date  . Depression   . Insomnia   . Anxiety   . Suicide attempt   . Erosive esophagitis 07/25/2012  . Gastric out let obstruction 07/25/2012  . HOH (hard of hearing)    Past Surgical History  Procedure Laterality Date  . Appendectomy    . Back surgery    . Ercp N/A 07/24/2012    Dr. Jena Gauss. Significant abnormalities of the bowl and proximal second portion producing partial gastric outlet stricture and with secondary gastric dilation and severe erosive reflux esophagitis. Biopsy showed benign ulceration. Normal-appearing ampulla, status post biliary sphincterotomy and ampulla balloon dilation, stone extraction and stent placement.  Marland Kitchen Sphincterotomy N/A 07/24/2012    Procedure: SPHINCTEROTOMY;  Surgeon: Corbin Ade, MD;  Location: [facility];  Service: Endoscopy;  Laterality: N/A;  . Esophageal biopsy N/A 07/24/2012    Procedure: BIOPSY;  Surgeon: Corbin Ade, MD;   Location: [facility];  Service: Endoscopy;  Laterality: N/A;  Duodenal and Esophageal Biopsies  . Esophagogastroduodenoscopy N/A 07/24/2012    [id number] ampulla s/p biliary sphincterotomy and ampullary  . Balloon dilation N/A 07/24/2012    Procedure: BALLOON DILATION;  Surgeon: Corbin Ade, MD;  Location: [facility];  Service: Endoscopy;  Laterality: N/A;  Balloon Stone Extraction, Drudging  . Removal of stones N/A 07/24/2012    Procedure: REMOVAL OF STONES;  Surgeon: Corbin Ade, MD;  Location: [facility];  Service: Endoscopy;  Laterality: N/A;  . Egd/ercp  10/18/2012    Dr. Jena Gauss: ;yloric channel stenosis, s/p dilation and bx, persisting CBD stones with extension of sphincterotomy and sphincterotomy balloon dilation and stone extraction. Removal of biliary stent  . Esophagogastroduodenoscopy (egd) with propofol N/A 10/18/2012    AYT:[id number] channel stenosis s/p dilation/Persisting common bile duct stones with extension of sphincterotomy     and sphincterotomy balloon dilation and stone extraction.  Biliary stent removal   . Ercp N/A 10/18/2012    Procedure: ENDOSCOPIC RETROGRADE CHOLANGIOPANCREATOGRAPHY (ERCP);  Surgeon: Corbin Ade, MD;  Location: [facility];  Service: Endoscopy;  Laterality: N/A;  . Removal of stones N/A 10/18/2012    Procedure: REMOVAL OF STONES;  Surgeon: Corbin Ade, MD;  Location: [facility];  Service: Endoscopy;  Laterality: N/A;  . Esophageal biopsy N/A 10/18/2012    Procedure: BIOPSY;  Surgeon: Corbin Ade, MD;  Location: [facility];  Service: Endoscopy;  Laterality: N/A;  . Cholecystectomy N/A  10/26/2012    Procedure: LAPAROSCOPIC CHOLECYSTECTOMY;  Surgeon: Dalia Heading, MD;  Location: [facility];  Service: General;  Laterality: N/A;  . Laparoscopy N/A 10/31/2012    Procedure: LAPAROSCOPY DIAGNOSTIC;  Surgeon: Fabio Bering, MD;  Location: [facility];  Service: General;  Laterality: N/A;   Family History  Problem Relation Age of Onset  . Colon cancer Neg Hx    History   Substance Use Topics  . Smoking status: Current Every Day Smoker    Types: Cigars  . Smokeless tobacco: Never Used  . Alcohol Use: No     Comment: reported from ED son stated he has been drinking a lot of wine recently    Review of Systems  Neurological: Positive for headaches.  Psychiatric/Behavioral: Positive for hallucinations and dysphoric mood. Negative for suicidal ideas.    Allergies  Review of patient's allergies indicates no known allergies.  Home Medications   Current Outpatient Rx  Name  Route  Sig  Dispense  Refill  . ARIPiprazole (ABILIFY) 2 MG tablet   Oral   Take 1 tablet (2 mg total) by mouth daily.   30 tablet   0   . clonazePAM (KLONOPIN) 1 MG tablet   Oral   Take 1 tablet (1 mg total) by mouth at bedtime.   30 tablet   0   . ENSURE PLUS (ENSURE PLUS) LIQD   Oral   Take 237 mLs by mouth 2 (two) times daily between meals.         Marland Kitchen escitalopram (LEXAPRO) 10 MG tablet   Oral   Take 1 tablet (10 mg total) by mouth daily.   30 tablet   0   . feeding supplement (RESOURCE BREEZE) LIQD   Oral   Take 1 Container by mouth 3 (three) times daily between meals.   20 Container   2   . megestrol (MEGACE) 40 MG tablet   Oral   Take 1 tablet (40 mg total) by mouth daily.   30 tablet   1   . Multiple Vitamin (MULTIVITAMIN WITH MINERALS) TABS   Oral   Take 1 tablet by mouth daily.         Marland Kitchen omeprazole (PRILOSEC) 20 MG capsule   Oral   Take 20 mg by mouth 2 (two) times daily.          BP 130/80  Pulse 86  Temp(Src) 98.6 F (37 C) (Oral)  Resp 18  SpO2 100%  Physical Exam  Nursing note and vitals reviewed. Constitutional: He is oriented to person, place, and time. He appears well-developed and well-nourished. No distress.  HENT:  Head: Normocephalic and atraumatic.  Eyes: EOM are normal.  Neck: Neck supple. No tracheal deviation present.  Cardiovascular: Normal rate.   Pulmonary/Chest: Effort normal. No respiratory distress.   Musculoskeletal: Normal range of motion.  Neurological: He is alert and oriented to person, place, and time.  Skin: Skin is warm and dry.  Psychiatric:  Does not make eye contact. Flat affect. Soft spoken. No active homicidal or suicidal ideations. Does have paranoia, especially at night.     ED Course  Procedures (including critical care time)  DIAGNOSTIC STUDIES: Oxygen Saturation is 100% on RA, normal by my interpretation.    COORDINATION OF CARE: 3:38 PM-Discussed treatment plan which includes lab work with pt at bedside and pt agreed to plan.   5:39 PM Pt is medically cleared to return to [facility].  He has a bed available.  Pt is  aware of plan.  i have personally consult with [facility] care provider, [place].    Labs Review Labs Reviewed  CBC WITH DIFFERENTIAL - Abnormal; Notable for the following:    RBC 3.65 (*)    Hemoglobin 12.0 (*)    HCT 34.0 (*)    All other components within normal limits  COMPREHENSIVE METABOLIC PANEL - Abnormal; Notable for the following:    GFR calc non Af Amer 57 (*)    GFR calc Af Amer 67 (*)    All other components within normal limits  ETHANOL  URINE RAPID DRUG SCREEN (HOSP PERFORMED)   Imaging Review No results found.  EKG Interpretation   None       MDM   1. Depression    BP 130/80  Pulse 86  Temp(Src) 98.6 F (37 C) (Oral)  Resp 18  SpO2 100%   I personally performed the services described in this documentation, which was scribed in my presence. The recorded information has been reviewed and is accurate.    Fayrene Helper, PA-C 04/26/13 1752

## 2013-04-27 DIAGNOSIS — F329 Major depressive disorder, single episode, unspecified: Secondary | ICD-10-CM

## 2013-04-27 DIAGNOSIS — F411 Generalized anxiety disorder: Secondary | ICD-10-CM

## 2013-04-27 DIAGNOSIS — F3289 Other specified depressive episodes: Secondary | ICD-10-CM

## 2013-04-27 DIAGNOSIS — R45851 Suicidal ideations: Secondary | ICD-10-CM

## 2013-04-27 MED ORDER — ESCITALOPRAM OXALATE 10 MG PO TABS
10.0000 mg | ORAL_TABLET | Freq: Every day | ORAL | Status: DC
  Administered 2013-04-27 – 2013-04-30 (×4): 10 mg via ORAL
  Filled 2013-04-27 (×5): qty 1

## 2013-04-27 MED ORDER — ARIPIPRAZOLE 2 MG PO TABS
2.0000 mg | ORAL_TABLET | Freq: Every day | ORAL | Status: DC
  Administered 2013-04-27 – 2013-04-30 (×4): 2 mg via ORAL
  Filled 2013-04-27 (×5): qty 1

## 2013-04-27 NOTE — Progress Notes (Signed)
D   Pt isolates to his room  He said he was waiting for his nurse to bring his sleep medications    He was active in groups today  He is somewhat confused and very soft spoken  He gets irritable when he has to repeat himself  A   Verbal support given  Medications administered and effectiveness monitored   Q 15 min checks R   Pt safe at present

## 2013-04-27 NOTE — Progress Notes (Signed)
Did not attended Group 
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
## 2013-04-27 NOTE — [facility] Suicide Risk Assessment (Signed)
Suicide Risk Assessment  Admission Assessment     Nursing information obtained from:    Demographic factors:   males Current Mental Status:   no si, confused at times, poor insight, no hi, no avh Loss Factors:   family conflict Historical Factors:   denies SI attemtps Risk Reduction Factors:   wants to get better  CLINICAL FACTORS:   Depression:   Hopelessness  COGNITIVE FEATURES THAT CONTRIBUTE TO RISK:  Loss of executive function    SUICIDE RISK:   Moderate:  Frequent suicidal ideation with limited intensity, and duration, some specificity in terms of plans, no associated intent, good self-control, limited dysphoria/symptomatology, some risk factors present, and identifiable protective factors, including available and accessible social support.  PLAN OF CARE:  Continue lexapro and abilify  Will hold klonopin and will continue to observe for side effects. Pt appears to be confused at times  I certify that inpatient services furnished can reasonably be expected to improve the patient's condition.  Wonda Cerise 04/27/2013, 11:46 AM

## 2013-04-27 NOTE — Progress Notes (Signed)
Patient ID: Edward Trevino, male   DOB: 09/27/1931, 79 y.o.   MRN: [id number] 04-27-13 nursing shift note: D: pt has been in his room and in the hallway much of the day. He denied any si/hi/av, but he did state that he was depressed. A: RN did a 1:1 with this patient. He was very sarcastic during the nurse assessment. He is a fall risk and uses a wheelchair. A; Staff continues to support, encourage and praise this patient. He is taking his medications at the medication window. R: on his inventory sheet he wrote: pt stated he sleep poor, appetite poor, energy low, attention poor with his depression and hopeless both at 5. No physical problems in the last 24 hrs. RN will monitor and Q 15 min ck's continue.

## 2013-04-27 NOTE — Progress Notes (Signed)
 .  Psychoeducational Group Note    Date: 04/27/2013 Time: 0930  Goal Setting Purpose of Group: To be able to set Trevino goal that is measurable and that can be accomplished in one day Participation Level:  Active  Participation Quality:  Appropriate  Affect:  Appropriate  Cognitive:  Oriented  Insight:  Improving  Engagement in Group:  Engaged  Additional Comments:    Edward Trevino 

## 2013-04-27 NOTE — Progress Notes (Signed)
Patient observed resting comfortably, eyes closed, respirations even and unlabored.

## 2013-04-27 NOTE — Progress Notes (Signed)
Psychoeducational Group Note  Date: 04/27/2013 Time:  1015  Group Topic/Focus:  Identifying Needs:   The focus of this group is to help patients identify their personal needs that have been historically problematic and identify healthy behaviors to address their needs.  Participation Level:  Active  Participation Quality:  Appropriate  Affect:  Appropriate  Cognitive:  Appropriate  Insight:  Engaged  Engagement in Group:  Engaged  Additional Comments:    Decker Cogdell A  

## 2013-04-27 NOTE — H&P (Signed)
Psychiatric Admission Assessment Adult  Patient Identification:  Edward Trevino Date of Evaluation:  04/27/2013 Chief Complaint:  depression History of Present Illness::   Edward Trevino is an 79 y.o. male admitted voluntarily to [facility] due to increasing depression, anxiety, inability to sleep, and suicidal thoughts. Pt referred by his psychologist- Dr. Creola Corn   He was referred to her by his PCP Dr. Gala Lewandowsky.    Pt reports that he often thinks "life is not worth living anymore, and his thoughts come and go". Patient however says that he doesn't have a suicidal plan or intent.  Pt denies HI, Visual Hallucinations, delusions or psychosis. Pt denies any hx of etoh or sa use, sexual, physical or emotional abuse. He also reports poor sleep. Not sure about the anxiety at this time.    Pt confirms that he has 2 admissions for his depression here at [facility] and the pt is unable to provide the dates. Pt reports that he is able to complete all ADL's w/o assistance. Pt states that he could use the help. Pt reports that he has 5 sons, 2 of which "live on my property and they don't come around at all". Per pt home situation makes him more depressed these days.    Psychiatric Specialty Exam: Physical Exam  ROS  Blood pressure 90/58, pulse 102, temperature 98 F (36.7 C), resp. rate 16, height 6\' 3"  (1.905 m), weight 73.936 kg (163 lb), SpO2 100.00%.Body mass index is 20.37 kg/(m^2).  General Appearance: Casual  Eye Contact::  Fair  Speech:  Slow  Volume:  Decreased  Mood:  Depressed  Affect:  Blunt  Thought Process:  Linear  Orientation:  Full (Time, Place, and Person)  Thought Content:  Negative  Suicidal Thoughts:  No  Homicidal Thoughts:  No  Memory:  Immediate;   Fair  Judgement:  Impaired  Insight:  Shallow  Psychomotor Activity:  Decreased  Concentration:  Poor  Recall:  Poor  Akathisia:  Negative  Handed:  Right  AIMS (if indicated):     Assets:  educated  Sleep:  Number of Hours: 5.5     Past Psychiatric History: Diagnosis:depression  Hospitalizations:yes  Outpatient Care:yes  Substance Abuse Care:none  Self-Mutilation:none  Suicidal Attempts:denies  Violent Behaviors:denies   Past Medical History:   Past Medical History  Diagnosis Date  . Depression   . Insomnia   . Anxiety   . Suicide attempt   . Erosive esophagitis 07/25/2012  . Gastric out let obstruction 07/25/2012  . HOH (hard of hearing)    None. Allergies:  No Known Allergies PTA Medications: Prescriptions prior to admission  Medication Sig Dispense Refill  . ARIPiprazole (ABILIFY) 2 MG tablet Take 1 tablet (2 mg total) by mouth daily.  30 tablet  0  . clonazePAM (KLONOPIN) 1 MG tablet Take 1 tablet (1 mg total) by mouth at bedtime.  30 tablet  0  . ENSURE PLUS (ENSURE PLUS) LIQD Take 237 mLs by mouth 2 (two) times daily between meals.      Marland Kitchen escitalopram (LEXAPRO) 10 MG tablet Take 1 tablet (10 mg total) by mouth daily.  30 tablet  0    Previous Psychotropic Medications:  Medication/Dose                 Substance Abuse History in the last 12 months:  yes  Consequences of Substance Abuse: Negative  Social History:  reports that he has quit smoking. He has never used smokeless tobacco. He reports that  he does not drink alcohol or use illicit drugs. Additional Social History: Pain Medications: SEE MAR Prescriptions: SEE MAR Over the Counter: SEE MAR History of alcohol / drug use?: Yes Name of Substance 1: Pt denies, however; per notation on a prior admission in 2011 pt was detoxed from [facility]. 1 - Age of First Use: unk 1 - Amount (size/oz): unk 1 - Frequency: unk 1 - Duration: unk 1 - Last Use / Amount: unk                  Current Place of Residence:   Place of Birth:   Family Members: Marital Status:  Widowed Children:  Sons:  Daughters: Relationships: Education:  Industrial/product designer: Religious Beliefs/Practices: History of Abuse  (Emotional/Phsycial/Sexual) Teacher, music History:  Data processing manager History: Hobbies/Interests:  Family History:   Family History  Problem Relation Age of Onset  . Colon cancer Neg Hx     Results for orders placed during the hospital encounter of 04/26/13 (from the past 72 hour(s))  CBC WITH DIFFERENTIAL     Status: Abnormal   Collection Time    04/26/13  4:33 PM      Result Value Range   WBC 7.0  4.0 - 10.5 K/uL   RBC 3.65 (*) 4.22 - 5.81 MIL/uL   Hemoglobin 12.0 (*) 13.0 - 17.0 g/dL   HCT 16.1 (*) 09.6 - 04.5 %   MCV 93.2  78.0 - 100.0 fL   MCH 32.9  26.0 - 34.0 pg   MCHC 35.3  30.0 - 36.0 g/dL   RDW 40.9  81.1 - 91.4 %   Platelets 232  150 - 400 K/uL   Neutrophils Relative % 62  43 - 77 %   Neutro Abs 4.3  1.7 - 7.7 K/uL   Lymphocytes Relative 29  12 - 46 %   Lymphs Abs 2.0  0.7 - 4.0 K/uL   Monocytes Relative 8  3 - 12 %   Monocytes Absolute 0.6  0.1 - 1.0 K/uL   Eosinophils Relative 1  0 - 5 %   Eosinophils Absolute 0.1  0.0 - 0.7 K/uL   Basophils Relative 0  0 - 1 %   Basophils Absolute 0.0  0.0 - 0.1 K/uL  COMPREHENSIVE METABOLIC PANEL     Status: Abnormal   Collection Time    04/26/13  4:33 PM      Result Value Range   Sodium 138  135 - 145 mEq/L   Potassium 4.0  3.5 - 5.1 mEq/L   Chloride 104  96 - 112 mEq/L   CO2 25  19 - 32 mEq/L   Glucose, Bld 85  70 - 99 mg/dL   BUN 13  6 - 23 mg/dL   Creatinine, Ser 7.82  0.50 - 1.35 mg/dL   Calcium 9.1  8.4 - 95.6 mg/dL   Total Protein 7.0  6.0 - 8.3 g/dL   Albumin 3.8  3.5 - 5.2 g/dL   AST 20  0 - 37 U/L   ALT 14  0 - 53 U/L   Alkaline Phosphatase 87  39 - 117 U/L   Total Bilirubin 0.3  0.3 - 1.2 mg/dL   GFR calc non Af Amer 57 (*) >90 mL/min   GFR calc Af Amer 67 (*) >90 mL/min   Comment: (NOTE)     The eGFR has been calculated using the CKD EPI equation.     This calculation has not been validated in all clinical  situations.     eGFR's persistently <90 mL/min signify possible Chronic Kidney      Disease.  ETHANOL     Status: None   Collection Time    04/26/13  4:33 PM      Result Value Range   Alcohol, Ethyl (B) <11  0 - 11 mg/dL   Comment:            LOWEST DETECTABLE LIMIT FOR     SERUM ALCOHOL IS 11 mg/dL     FOR MEDICAL PURPOSES ONLY  URINE RAPID DRUG SCREEN (HOSP PERFORMED)     Status: None   Collection Time    04/26/13  5:29 PM      Result Value Range   Opiates NONE DETECTED  NONE DETECTED   Cocaine NONE DETECTED  NONE DETECTED   Benzodiazepines NONE DETECTED  NONE DETECTED   Amphetamines NONE DETECTED  NONE DETECTED   Tetrahydrocannabinol NONE DETECTED  NONE DETECTED   Barbiturates NONE DETECTED  NONE DETECTED   Comment:            DRUG SCREEN FOR MEDICAL PURPOSES     ONLY.  IF CONFIRMATION IS NEEDED     FOR ANY PURPOSE, NOTIFY LAB     WITHIN 5 DAYS.                LOWEST DETECTABLE LIMITS     FOR URINE DRUG SCREEN     Drug Class       Cutoff (ng/mL)     Amphetamine      1000     Barbiturate      200     Benzodiazepine   200     Tricyclics       300     Opiates          300     Cocaine          300     THC              50   Psychological Evaluations:  Assessment:   DSM5:    AXIS I:  Depressive Disorder NOS AXIS II:  Deferred AXIS III:   Past Medical History  Diagnosis Date  . Depression   . Insomnia   . Anxiety   . Suicide attempt   . Erosive esophagitis 07/25/2012  . Gastric out let obstruction 07/25/2012  . HOH (hard of hearing)    AXIS IV:  other psychosocial or environmental problems AXIS V:  21-30 behavior considerably influenced by delusions or hallucinations OR serious impairment in judgment, communication OR inability to function in almost all areas  Treatment Plan/Recommendations:    Continue lexapro and abilify for mood and anxiety  Treatment Plan Summary: Daily contact with patient to assess and evaluate symptoms and progress in treatment Current Medications:  Current Facility-Administered Medications  Medication Dose  Route Frequency Provider Last Rate Last Dose  . acetaminophen (TYLENOL) tablet 650 mg  650 mg Oral Q6H PRN Kristeen Mans, NP      . alum & mag hydroxide-simeth (MAALOX/MYLANTA) 200-200-20 MG/5ML suspension 30 mL  30 mL Oral Q4H PRN Kristeen Mans, NP      . ARIPiprazole (ABILIFY) tablet 2 mg  2 mg Oral Daily Wonda Cerise, MD   2 mg at 04/27/13 1635  . escitalopram (LEXAPRO) tablet 10 mg  10 mg Oral Daily Wonda Cerise, MD   10 mg at 04/27/13 1636  . hydrOXYzine (ATARAX/VISTARIL) tablet 50 mg  50 mg  Oral QHS PRN Kristeen Mans, NP   50 mg at 04/26/13 2244  . magnesium hydroxide (MILK OF MAGNESIA) suspension 30 mL  30 mL Oral Daily PRN Kristeen Mans, NP        Observation Level/Precautions:  15 minute checks  Laboratory:  later if needed  Psychotherapy:    Medications:    Consultations:    Discharge Concerns:    Estimated LOS: 10 days  Other:     I certify that inpatient services furnished can reasonably be expected to improve the patient's condition.   Wonda Cerise 12/13/20148:15 PM

## 2013-04-28 DIAGNOSIS — F332 Major depressive disorder, recurrent severe without psychotic features: Principal | ICD-10-CM

## 2013-04-28 MED ORDER — ENSURE COMPLETE PO LIQD
237.0000 mL | Freq: Two times a day (BID) | ORAL | Status: DC
  Administered 2013-04-28 – 2013-04-29 (×2): 237 mL via ORAL

## 2013-04-28 NOTE — Progress Notes (Signed)
Patient ID: Edward Trevino, male   DOB: 1931/09/11, 79 y.o.   MRN: [id number]  D: Patient has a flat affect on approach. Some irritability noted when trying to help him with his bed. Reports depression better than on first admission. States medicine helped him sleep last night. Currently denies having any SI today thus far. A: Staff will monitor on q 15 minute checks, follow treatment plan, and give meds as ordered. R: Took meds without issue. Cooperative on unit. Using wheelchair due to high fall risk.

## 2013-04-28 NOTE — [facility] Group Notes (Signed)
[facility] Group Notes: (Clinical Social Work)   04/28/2013      Type of Therapy:  Group Therapy   Participation Level:  Did Not Attend    Ambrose Mantle, LCSW 04/28/2013, 4:26 PM

## 2013-04-28 NOTE — Progress Notes (Signed)
Nutrition Brief Note Patient identified on the Malnutrition Screening Tool (MST) Report.  Wt Readings from Last 10 Encounters:  04/26/13 163 lb (73.936 kg)  12/18/12 175 lb 14.8 oz (79.8 kg)  12/18/12 180 lb (81.647 kg)  11/08/12 167 lb 12.8 oz (76.114 kg)  10/30/12 175 lb (79.379 kg)  10/30/12 175 lb (79.379 kg)  10/23/12 175 lb 0.7 oz (79.4 kg)  10/23/12 175 lb 0.7 oz (79.4 kg)  10/19/12 184 lb 15.5 oz (83.9 kg)  10/15/12 184 lb (83.462 kg)   Body mass index is 20.37 kg/(m^2). Patient meets criteria for normal body weight based on current BMI.   Pt reports that he drinks ensure at home and would like to have it sent while in [facility]. He reported that he has had a poor appetite for the past several months. Per wt history in chart, pt has lost 21 lbs since June 2014.   Discussed intake PTA with patient and compared to intake presently.  Discussed changes in intake, if any, and encouraged adequate intake of meals and snacks. Current diet order is regular and pt is also offered choice of unit snacks mid-morning and mid-afternoon.  Pt is eating as desired.   Labs and medications reviewed.   Nutrition Dx:  Inadequate oral intake related to depression as evidenced by wt loss  Interventions:   Discussed the importance of nutrition and encouraged intake of food and beverages.     Discussed weight goals with patient.   Supplements: Ensure Complete po BID, each supplement provides 350 kcal and 13 grams of protein    No additional nutrition interventions warranted at this time. If nutrition issues arise, please consult RD.   Ebbie Latus RD, LDN
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
## 2013-04-28 NOTE — Progress Notes (Signed)
Patient ID: Edward Trevino, male   DOB: 10-01-1931, 79 y.o.   MRN: [id number] Has been resting quietly tonight, eyes closed, resp reg, unlabored, no c/o's voiced.  Safety maintained.

## 2013-04-28 NOTE — Progress Notes (Signed)
Psychoeducational Group Note  Date:  04/28/2013 Time:  1015  Group Topic/Focus:  Making Healthy Choices:   The focus of this group is to help patients identify negative/unhealthy choices they were using prior to admission and identify positive/healthier coping strategies to replace them upon discharge.  Participation Level: Did not attend  Dione Housekeeper 04/28/2013

## 2013-04-28 NOTE — Progress Notes (Signed)
Adult Psychoeducational Group Note  Date:  04/28/2013 Time:  9:31 PM  Group Topic/Focus:  Wrap-Up Group:   The focus of this group is to help patients review their daily goal of treatment and discuss progress on daily workbooks.  Participation Level:  Did Not Attend   Edward Trevino 04/28/2013, 9:31 PM

## 2013-04-28 NOTE — Progress Notes (Signed)
Psychoeducational Group Note  Date: 04/28/2013 Time:0930  Group Topic/Focus:  Gratefulness:  The focus of this group is to help patients identify what two things they are most grateful for in their lives. What helps ground them and to center them on their work to their recovery.  Participation Level:  Did not attend  Dione Housekeeper

## 2013-04-28 NOTE — Progress Notes (Signed)
Patient ID: Edward Trevino, male   DOB: 08-08-1931, 79 y.o.   MRN: [id number] D. Patient presents with depressed mood, affect flat. He continues to be isolative to room, and irritable at times. He reports to Clinical research associate that '' I'm tired. I didn't sleep that great. But I'm ok. '' He has not attended unit programming throughout shift thus far, and minimal interaction noted with peers. A. Medications given as ordered. Support and encouragement provided. R. Patient currently resting quietly in room. In no acute distress as this time. Will continue to monitor q 15 minutes for safety.

## 2013-04-28 NOTE — Progress Notes (Signed)
[facility] MD Progress Note  04/28/2013 10:53 AM Edward Trevino  MRN:  [id number] Subjective:  Patient stated he felt "pretty good", sleep and appetite are "good", depression is "better" with no suicidal ideations, hard of hearing. Diagnosis:   DSM5:  Substance/Addictive Disorders:  Alcohol Related Disorder - Severe (303.90) Depressive Disorders:  Major Depressive Disorder - Severe (296.23)  Axis I: Anxiety Disorder NOS and Major Depression, Recurrent severe Axis II: Deferred Axis III:  Past Medical History  Diagnosis Date  . Depression   . Insomnia   . Anxiety   . Suicide attempt   . Erosive esophagitis 07/25/2012  . Gastric out let obstruction 07/25/2012  . HOH (hard of hearing)    Axis IV: other psychosocial or environmental problems, problems related to social environment and problems with primary support group Axis V: 41-50 serious symptoms  ADL's:  Intact  Sleep: Good  Appetite:  Good  Suicidal Ideation:  Denies  Homicidal Ideation:  Denies   Psychiatric Specialty Exam: Review of Systems  Constitutional: Negative.   HENT: Negative.   Eyes: Negative.   Respiratory: Negative.   Cardiovascular: Negative.   Gastrointestinal: Negative.   Genitourinary: Negative.   Musculoskeletal: Negative.   Skin: Negative.   Neurological: Negative.   Endo/Heme/Allergies: Negative.   Psychiatric/Behavioral: Positive for depression. The patient is nervous/anxious.     Blood pressure 110/72, pulse 96, temperature 98 F (36.7 C), resp. rate 18, height 6\' 3"  (1.905 m), weight 73.936 kg (163 lb), SpO2 100.00%.Body mass index is 20.37 kg/(m^2).  General Appearance: Casual  Eye Contact::  Fair  Speech:  Normal Rate  Volume:  Normal  Mood:  Depressed  Affect:  Congruent  Thought Process:  Coherent  Orientation:  Full (Time, Place, and Person)  Thought Content:  WDL  Suicidal Thoughts:  No  Homicidal Thoughts:  No  Memory:  Immediate;   Fair Recent;   Fair Remote;   Fair   Judgement:  Fair  Insight:  Fair  Psychomotor Activity:  Decreased  Concentration:  Fair  Recall:  Fair  Akathisia:  No  Handed:  Right  AIMS (if indicated):     Assets:  Resilience  Sleep:  Number of Hours: 3.25   Current Medications: Current Facility-Administered Medications  Medication Dose Route Frequency Provider Last Rate Last Dose  . acetaminophen (TYLENOL) tablet 650 mg  650 mg Oral Q6H PRN Kristeen Mans, NP      . alum & mag hydroxide-simeth (MAALOX/MYLANTA) 200-200-20 MG/5ML suspension 30 mL  30 mL Oral Q4H PRN Kristeen Mans, NP      . ARIPiprazole (ABILIFY) tablet 2 mg  2 mg Oral Daily Wonda Cerise, MD   2 mg at 04/28/13 0835  . escitalopram (LEXAPRO) tablet 10 mg  10 mg Oral Daily Wonda Cerise, MD   10 mg at 04/28/13 0835  . hydrOXYzine (ATARAX/VISTARIL) tablet 50 mg  50 mg Oral QHS PRN Kristeen Mans, NP   50 mg at 04/27/13 2102  . magnesium hydroxide (MILK OF MAGNESIA) suspension 30 mL  30 mL Oral Daily PRN Kristeen Mans, NP        Lab Results:  Results for orders placed during the hospital encounter of 04/26/13 (from the past 48 hour(s))  CBC WITH DIFFERENTIAL     Status: Abnormal   Collection Time    04/26/13  4:33 PM      Result Value Range   WBC 7.0  4.0 - 10.5 K/uL   RBC 3.65 (*) 4.22 -  5.81 MIL/uL   Hemoglobin 12.0 (*) 13.0 - 17.0 g/dL   HCT 40.9 (*) 81.1 - 91.4 %   MCV 93.2  78.0 - 100.0 fL   MCH 32.9  26.0 - 34.0 pg   MCHC 35.3  30.0 - 36.0 g/dL   RDW 78.2  95.6 - 21.3 %   Platelets 232  150 - 400 K/uL   Neutrophils Relative % 62  43 - 77 %   Neutro Abs 4.3  1.7 - 7.7 K/uL   Lymphocytes Relative 29  12 - 46 %   Lymphs Abs 2.0  0.7 - 4.0 K/uL   Monocytes Relative 8  3 - 12 %   Monocytes Absolute 0.6  0.1 - 1.0 K/uL   Eosinophils Relative 1  0 - 5 %   Eosinophils Absolute 0.1  0.0 - 0.7 K/uL   Basophils Relative 0  0 - 1 %   Basophils Absolute 0.0  0.0 - 0.1 K/uL  COMPREHENSIVE METABOLIC PANEL     Status: Abnormal   Collection Time    04/26/13  4:33  PM      Result Value Range   Sodium 138  135 - 145 mEq/L   Potassium 4.0  3.5 - 5.1 mEq/L   Chloride 104  96 - 112 mEq/L   CO2 25  19 - 32 mEq/L   Glucose, Bld 85  70 - 99 mg/dL   BUN 13  6 - 23 mg/dL   Creatinine, Ser 0.86  0.50 - 1.35 mg/dL   Calcium 9.1  8.4 - 57.8 mg/dL   Total Protein 7.0  6.0 - 8.3 g/dL   Albumin 3.8  3.5 - 5.2 g/dL   AST 20  0 - 37 U/L   ALT 14  0 - 53 U/L   Alkaline Phosphatase 87  39 - 117 U/L   Total Bilirubin 0.3  0.3 - 1.2 mg/dL   GFR calc non Af Amer 57 (*) >90 mL/min   GFR calc Af Amer 67 (*) >90 mL/min   Comment: (NOTE)     The eGFR has been calculated using the CKD EPI equation.     This calculation has not been validated in all clinical situations.     eGFR's persistently <90 mL/min signify possible Chronic Kidney     Disease.  ETHANOL     Status: None   Collection Time    04/26/13  4:33 PM      Result Value Range   Alcohol, Ethyl (B) <11  0 - 11 mg/dL   Comment:            LOWEST DETECTABLE LIMIT FOR     SERUM ALCOHOL IS 11 mg/dL     FOR MEDICAL PURPOSES ONLY  URINE RAPID DRUG SCREEN (HOSP PERFORMED)     Status: None   Collection Time    04/26/13  5:29 PM      Result Value Range   Opiates NONE DETECTED  NONE DETECTED   Cocaine NONE DETECTED  NONE DETECTED   Benzodiazepines NONE DETECTED  NONE DETECTED   Amphetamines NONE DETECTED  NONE DETECTED   Tetrahydrocannabinol NONE DETECTED  NONE DETECTED   Barbiturates NONE DETECTED  NONE DETECTED   Comment:            DRUG SCREEN FOR MEDICAL PURPOSES     ONLY.  IF CONFIRMATION IS NEEDED     FOR ANY PURPOSE, NOTIFY LAB     WITHIN 5 DAYS.  LOWEST DETECTABLE LIMITS     FOR URINE DRUG SCREEN     Drug Class       Cutoff (ng/mL)     Amphetamine      1000     Barbiturate      200     Benzodiazepine   200     Tricyclics       300     Opiates          300     Cocaine          300     THC              50    Physical Findings: AIMS: Facial and Oral Movements Muscles of Facial  Expression: None, normal Lips and Perioral Area: None, normal Jaw: None, normal Tongue: None, normal,Extremity Movements Upper (arms, wrists, hands, fingers): None, normal Lower (legs, knees, ankles, toes): None, normal, Trunk Movements Neck, shoulders, hips: None, normal, Overall Severity Severity of abnormal movements (highest score from questions above): None, normal Incapacitation due to abnormal movements: None, normal Patient's awareness of abnormal movements (rate only patient's report): No Awareness, Dental Status Current problems with teeth and/or dentures?: No Does patient usually wear dentures?: No  CIWA:    COWS:     Treatment Plan Summary: Daily contact with patient to assess and evaluate symptoms and progress in treatment Medication management  Plan:  Review of chart, vital signs, medications, and notes. 1-Individual and group therapy 2-Medication management for depression and anxiety:  Medications reviewed with the patient and he stated no untoward effects, no changes made 3-Coping skills for depression, anxiety 4-Continue crisis stabilization and management 5-Address health issues--monitoring vital signs, stable 6-Treatment plan in progress to prevent relapse of depression and anxiety  Medical Decision Making Problem Points:  Established problem, stable/improving (1) and Review of psycho-social stressors (1) Data Points:  Review of medication regiment & side effects (2)  I certify that inpatient services furnished can reasonably be expected to improve the patient's condition.   Nanine Means, PMH-NP 04/28/2013, 10:53 AM

## 2013-04-29 NOTE — Progress Notes (Signed)
Recreation Therapy Notes  Date: 12.15.2014 Time: 3:00pm Location: [address] Dayroom   Group Topic: Communication, Team Building, Problem Solving  Goal Area(s) Addresses:  Patient will effectively work with peer towards shared goal.  Patient will identify skill used to make activity successful.  Patient will identify how skills used during activity can be used to reach post d/c goals.   Behavioral Response: Did not attend.   Marykay Lex Esaul Dorwart, LRT/CTRS  Kersti Scavone L 04/29/2013 4:18 PM

## 2013-04-29 NOTE — [facility] Group Notes (Signed)
[facility] LCSW Aftercare Discharge Planning Group Note   04/29/2013 8:45 AM  Participation Quality:  Alert, Appropriate and Oriented  Mood/Affect:  Calm  Depression Rating:  5  Anxiety Rating:  6  Thoughts of Suicide:  Pt denies SI/HI, adding "not yet"  Will you contract for safety?   Yes  Current AVH:  Pt denies  Plan for Discharge/Comments:  Pt attended discharge planning group and actively participated in group.  CSW provided pt with today's workbook.  Pt is hopeful to d/c soon.  Pt states that he is doing well today.  Pt states that he came to the hospital for depression around the holidays.  Pt will return home in [place].  Pt denies having follow up.  CSW will assess for appropriate referrals. No further needs voiced by pt at this time.    Transportation Means: Pt reports access to transportation - pt reports having chauffeur   Supports: No supports mentioned at this time  Reyes Ivan, LCSW 04/29/2013 10:10 AM
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
## 2013-04-29 NOTE — Progress Notes (Signed)
Patient ID: Edward Trevino, male   DOB: 09-02-1931, 79 y.o.   MRN: [id number]   D: Pt. denies SI/HI and A/V Hallucinations.  Patient does not report any pain or discomfort at this time. Patient did not do his daily patient inventory sheet even upon request by Clinical research associate.  A: Support and encouragement provided to the patient to go to groups and not isolate. Patient encouraged to turn in daily inventory sheet but patient has not yet.   R: Patient is receptive but does not seem to be going to many groups. Patient is seen in the hallway/milieu in his wheelchair periodically but is very minimal with Clinical research associate. Q15 minute checks are maintained for safety.

## 2013-04-29 NOTE — Progress Notes (Signed)
D: Patient in his room on [facility].  Patient ame to th medication window.  Patient animated and spoke with Clinical research associate.  Patient states he had a good day and he states he is taking care of himself.  Patient denies SI/HI and denies AVH. A: Staff to monitor Q 15 mins for safety.  Encouragement and support offered. No scheduled medications administered per orders.  Vistaril administered prn for sleep. R: Patient remains safe on the unit.  Patient did not attend group tonight.  Patient visible on the unit for medications.  Patient taking administered medications.

## 2013-04-29 NOTE — [facility] Group Notes (Signed)
[facility] LCSW Group Therapy  04/29/2013  1:15 PM   Type of Therapy:  Group Therapy  Participation Level:  Active  Participation Quality:  Attentive, Sharing and Supportive  Affect:  Depressed and Flat  Cognitive:  Alert and Oriented  Insight:  Developing/Improving and Engaged  Engagement in Therapy:  Developing/Improving and Engaged  Modes of Intervention:  Clarification, Confrontation, Discussion, Education, Exploration, Limit-setting, Orientation, Problem-solving, Rapport Building, Dance movement psychotherapist, Socialization and Support  Summary of Progress/Problems: Pt identified obstacles faced currently and processed barriers involved in overcoming these obstacles. Pt identified steps necessary for overcoming these obstacles and explored motivation (internal and external) for facing these difficulties head on. Pt further identified one area of concern in their lives and chose a goal to focus on for today.  Pt shared that he feels safe here in the hospital but when he goes home he is lonely.  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  Pt was tearful when sharing the loss of his wife 10 years ago, stating he could've been a better husband to her.  Pt shared that "I lasted 80 years, I can last a few more".  Pt was supportive to peers and was able to relate to a peer about grief and loss.  Pt actively participated and was engaged in group discussion.    Reyes Ivan, LCSW 04/29/2013 2:09 PM

## 2013-04-29 NOTE — Progress Notes (Signed)
Patient ID: Edward Trevino, male   DOB: [birth year], 79 y.o.   MRN: [id number] D)  Has been out on the hall this evening in his w/c, quiet, affect flat, didn't attend group, came to med window afterward and asked if he had any hs meds prior to going to bed.  Was given vistaril for sleep and went to his room.  Minimal interaction noted with peers or staff. A)  Will continue to monitor for safety, continue POC R)  Safety maintained.

## 2013-04-29 NOTE — Tx Team (Signed)
Interdisciplinary Treatment Plan Update (Adult)  Date: 04/29/2013  Time Reviewed:  9:45 AM  Progress in Treatment: Attending groups: Yes Participating in groups:  Yes Taking medication as prescribed:  Yes Tolerating medication:  Yes Family/Significant othe contact made: CSW assessing  Patient understands diagnosis:  Yes Discussing patient identified problems/goals with staff:  Yes Medical problems stabilized or resolved:  Yes Denies suicidal/homicidal ideation: Yes Issues/concerns per patient self-inventory:  Yes Other:  New problem(s) identified: N/A  Discharge Plan or Barriers: CSW assessing for appropriate referrals.  Reason for Continuation of Hospitalization: Anxiety Depression Medication Stabilization  Comments: N/A  Estimated length of stay: 3-5 days  For review of initial/current patient goals, please see plan of care.  Attendees: Patient:     Family:     Physician:  Dr. Javier Glazier 04/29/2013 10:29 AM   Nursing:   Dellia Cloud, RN 04/29/2013 10:29 AM   Clinical Social Worker:  Reyes Ivan, LCSW 04/29/2013 10:29 AM   Other: Verne Spurr, PA 04/29/2013 10:29 AM   Other:  Marzetta Board, RN 04/29/2013 10:29 AM   Other:  Juline Patch, LCSW 04/29/2013 10:29 AM   Other:  Neill Loft, RN 04/29/2013 10:29 AM   Other:    Other:    Other:    Other:    Other:    Other:     Scribe for Treatment Team:   Carmina Miller, 04/29/2013 10:29 AM

## 2013-04-29 NOTE — [facility] Suicide Risk Assessment (Signed)
[facility] INPATIENT:  Family/Significant Other Suicide Prevention Education  Suicide Prevention Education:  Education Completed; Armstrong Creasy - son [phone]),  (name of family member/significant other) has been identified by the patient as the family member/significant other with whom the patient will be residing, and identified as the person(s) who will aid the patient in the event of a mental health crisis (suicidal ideations/suicide attempt).  With written consent from the patient, the family member/significant other has been provided the following suicide prevention education, prior to the and/or following the discharge of the patient.  The suicide prevention education provided includes the following:  Suicide risk factors  Suicide prevention and interventions  National Suicide Hotline telephone number  [facility] assessment telephone number  [facility] Emergency Assistance 911  [facility] and/or Residential Mobile Crisis Unit telephone number  Request made of family/significant other to:  Remove weapons (e.g., guns, rifles, knives), all items previously/currently identified as safety concern.    Remove drugs/medications (over-the-counter, prescriptions, illicit drugs), all items previously/currently identified as a safety concern.  The family member/significant other verbalizes understanding of the suicide prevention education information provided.  The family member/significant other agrees to remove the items of safety concern listed above.  Pt's son states that son in law takes him into town 4 times a week and family lives all around pt, so pt is never alone.   Pt's son states that pt wants to be left alone and have his alone time, but reaches out when he needs help.    Carmina Miller 04/29/2013, 3:33 PM

## 2013-04-30 MED ORDER — ARIPIPRAZOLE 2 MG PO TABS: 2.0000 mg | ORAL_TABLET | Freq: Every day | ORAL | Status: DC

## 2013-04-30 MED ORDER — ESCITALOPRAM OXALATE 10 MG PO TABS: 10.0000 mg | ORAL_TABLET | Freq: Every day | ORAL | Status: DC

## 2013-04-30 NOTE — [facility] Suicide Risk Assessment (Signed)
Suicide Risk Assessment  Discharge Assessment     Demographic Factors:  Male, Age 79 or older, Caucasian and Living alone  Mental Status Per Nursing Assessment::   On Admission:     Current Mental Status by Physician: NA  Loss Factors: Financial problems/change in socioeconomic status  Historical Factors: Prior suicide attempts, Family history of mental illness or substance abuse and Impulsivity  Risk Reduction Factors:   Sense of responsibility to family, Religious beliefs about death, Living with another person, especially a relative, Positive social support, Positive therapeutic relationship and Positive coping skills or problem solving skills  Continued Clinical Symptoms:  Depression:   Recent sense of peace/wellbeing  Cognitive Features That Contribute To Risk:  Polarized thinking    Suicide Risk:  Minimal: No identifiable suicidal ideation.  Patients presenting with no risk factors but with morbid ruminations; may be classified as minimal risk based on the severity of the depressive symptoms  Discharge Diagnoses:   AXIS I:  Major Depression, Recurrent severe AXIS II:  Deferred AXIS III:   Past Medical History  Diagnosis Date  . Depression   . Insomnia   . Anxiety   . Suicide attempt   . Erosive esophagitis 07/25/2012  . Gastric out let obstruction 07/25/2012  . HOH (hard of hearing)    AXIS IV:  economic problems, other psychosocial or environmental problems, problems related to social environment and problems with primary support group AXIS V:  50-60  Plan Of Care/Follow-up recommendations:  Activity:  as tolerated Diet:  regular  Is patient on multiple antipsychotic therapies at discharge:  No   Has Patient had three or more failed trials of antipsychotic monotherapy by history:  No  Recommended Plan for Multiple Antipsychotic Therapies: NA  Edward Trevino,Edward R. 04/30/2013, 11:43 AM

## 2013-04-30 NOTE — Progress Notes (Signed)
[facility] Adult Case Management Discharge Plan :  Will you be returning to the same living situation after discharge: Yes,  returning to own home At discharge, do you have transportation home?:Yes,  family will pick pt up Do you have the ability to pay for your medications:Yes,  access to meds  Release of information consent forms completed and in the chart;  Patient's signature needed at discharge.  Patient to Follow up at: Follow-up Information   Follow up with [facility] On 05/02/2013. (Appointment scheduled at 11:00 am with Dr. Tenny Craw for medication management.  Please bring completed paperwork.  )    Contact information:   [address], [address] [place], [place] [address] [phone]      Patient denies SI/HI:   Yes,  denies SI/HI    Safety Planning and Suicide Prevention discussed:  Yes,  discussed with pt and pt's son.  See suicide prevention education note.   Also discussed with pt's son today the importance of pt following up with his outpatient psychiatrist.  Reviewed pt's appointment with pt's son.    Carmina Miller 04/30/2013, 11:09 AM

## 2013-04-30 NOTE — Discharge Summary (Signed)
Physician Discharge Summary Note  Patient:  Edward Trevino is an 79 y.o., male MRN:  [id number] DOB:  Nov 29, 1931 Patient phone:  [phone] (home)  Patient address:   [address] [place] [address],   Date of Admission:  04/26/2013 Date of Discharge: 04/30/2013   Reason for Admission:  MDD worsening with suicidal ideation Discharge Diagnoses: Active Problems:   * No active hospital problems. *  ROS  DSM5: AXIS I: MDD severe recurrent with suicidal ideations AXIS II: Deferred  AXIS III:  Past Medical History   Diagnosis  Date   .  Depression    .  Insomnia    .  Anxiety    .  Suicide attempt    .  Erosive esophagitis  07/25/2012   .  Gastric out let obstruction  07/25/2012   .  HOH (hard of hearing)     AXIS IV: other psychosocial or environmental problems  AXIS V: 21-30 behavior considerably influenced by delusions or hallucinations OR serious impairment in judgment, communication OR inability to function in almost all areas   Level of Care:  OP  Hospital Course:  Caison Hearn is a 79 year old male admitted voluntarily from the [facility] where he presented on the advice of his PCP Dr. Margo Common, and psychologist Dr. Creola Corn.  He had endorsed worsening depression and suicidal ideation. Due to his worsening symptoms, previous suicide attempt, and current stressors he was admitted for safety, crisis management, and stabilization.        [name] was admitted to the adult unit. He was evaluated and his symptoms were identified. Medication management was discussed and initiated. He was oriented to the unit and encouraged to participate in unit programming. Medical problems were identified and treated appropriately. Home medication was restarted as needed.        The patient was evaluated each day by a clinical provider to ascertain the patient's response to treatment.  Improvement was noted by the patient's report of decreasing symptoms, improved sleep and appetite,  affect, medication tolerance, behavior, and participation in unit programming.  Dyshon was asked each day to complete a self inventory noting mood, mental status, pain, new symptoms, anxiety and concerns.         He responded well to medication and being in a therapeutic and supportive environment. Positive and appropriate behavior was noted and the patient was motivated for recovery. Eula worked closely with the treatment team and case manager to develop a discharge plan with appropriate goals. Coping skills, problem solving as well as relaxation therapies were also part of the unit programming.         By the day of discharge Marce was in much improved condition than upon admission.  Symptoms were reported as significantly decreased or resolved completely. The patient denied SI/HI and voiced no AVH. He was motivated to continue taking medication with a goal of continued improvement in mental health.          Erhard was discharged home with a plan to follow up as noted below. Consults:  None  Significant Diagnostic Studies:  labs: CBC, CMP, UA, UDS  Discharge Vitals:   Blood pressure 98/66, pulse 51, temperature 98.1 F (36.7 C), temperature source Oral, resp. rate 16, height 6\' 3"  (1.905 m), weight 73.936 kg (163 lb), SpO2 100.00%. Body mass index is 20.37 kg/(m^2). Lab Results:   No results found for this or any previous visit (from the past 72 hour(s)).  Physical  Findings: AIMS: Facial and Oral Movements Muscles of Facial Expression: None, normal Lips and Perioral Area: None, normal Jaw: None, normal Tongue: None, normal,Extremity Movements Upper (arms, wrists, hands, fingers): None, normal Lower (legs, knees, ankles, toes): None, normal, Trunk Movements Neck, shoulders, hips: None, normal, Overall Severity Severity of abnormal movements (highest score from questions above): None, normal Incapacitation due to abnormal movements: None, normal Patient's awareness of abnormal movements (rate  only patient's report): No Awareness, Dental Status Current problems with teeth and/or dentures?: No Does patient usually wear dentures?: No  CIWA:    COWS:     Psychiatric Specialty Exam: See Psychiatric Specialty Exam and Suicide Risk Assessment completed by Attending Physician prior to discharge.  Discharge destination:  Home  Is patient on multiple antipsychotic therapies at discharge:  No   Has Patient had three or more failed trials of antipsychotic monotherapy by history:  No  Recommended Plan for Multiple Antipsychotic Therapies: NA  Discharge Orders   Future Orders Complete By Expires   Diet - low sodium heart healthy  As directed    Discharge instructions  As directed    Comments:     Take all of your medications as directed. Be sure to keep all of your follow up appointments.  If you are unable to keep your follow up appointment, call your Doctor's office to let them know, and reschedule.  Make sure that you have enough medication to last until your appointment. Be sure to get plenty of rest. Going to bed at the same time each night will help. Try to avoid sleeping during the day.  Increase your activity as tolerated. Regular exercise will help you to sleep better and improve your mental health. Eating a heart healthy diet is recommended. Try to avoid salty or fried foods. Be sure to avoid all alcohol and illegal drugs.   Increase activity slowly  As directed        Medication List    STOP taking these medications       clonazePAM 1 MG tablet  Commonly known as:  KLONOPIN     ENSURE PLUS Liqd      TAKE these medications     Indication   ARIPiprazole 2 MG tablet  Commonly known as:  ABILIFY  Take 1 tablet (2 mg total) by mouth daily. For adjunctive treatment of depression.   Indication:  Major Depressive Disorder     escitalopram 10 MG tablet  Commonly known as:  LEXAPRO  Take 1 tablet (10 mg total) by mouth daily. For depression.   Indication:  Depression            Follow-up Information   Follow up with [facility] On 05/02/2013. (Appointment scheduled at 11:00 am with Dr. Tenny Craw for medication management.  Please bring completed paperwork.  )    Contact information:   [address] [place], [place] [address] [phone]      Follow-up recommendations:   Activities: Resume activity as tolerated. Diet: Heart healthy low sodium diet Tests: Follow up testing will be determined by your out patient provider. Comments:   Should Tamel need readmission in the future it is recommended that he be referred to [facility] to the Geriatric Psychiatric unit. He may gain more benefit from their programming.  Total Discharge Time:  Less than 30 minutes.  Signed: Rona Ravens. Mashburn RPAC 10:50 AM 04/30/2013  Met with Patient for a face-to-face evaluation of psychiatric assessment, suicide risk assessment and case discussed  with physician assistant, formulate a treatment plan and later made disposition plan with the help of case manager. Reviewed the information documented and agree with the treatment plan.  Jereme Loren,JANARDHAHA R. 04/30/2013 1:12 PM

## 2013-04-30 NOTE — Care Management Utilization Note (Signed)
Per State Regulation 482.30  The chart was reviewed for necessity with respect to the patient's Admission/ Duration of stay.Admission 04/26/13  Next Review Date:04/29/13.  Lacinda Axon, RN, BSN

## 2013-04-30 NOTE — Progress Notes (Signed)
Patient ID: Edward Trevino, male   DOB: 06-11-1931, 79 y.o.   MRN: [id number] Patient discharged per physician order; patient denies SI/HI and A/V hallucinations; patient gave verbal permission to allow Patrici Ranks to sit in during the explanation of his discharge instructions; patient was given copy of AVS, prescriptions, samples, and outpatient registration packet after it was reviewed; patient and attendee had no other questions or concerns at this time; patient verbalized and signed that he received all belongings; patient left ambulatory

## 2013-04-30 NOTE — Progress Notes (Signed)
The focus of this group is to educate the patient on the purpose and policies of crisis stabilization and provide a format to answer questions about their admission.  The group details unit policies and expectations of patients while admitted. Patient did not have the opportunity to engage in the group because she was called out by the physician and did not return

## 2013-04-30 NOTE — Care Management Utilization Note (Signed)
Per State Regulation 482.30  The chart was reviewed for necessity with respect to the patient's Admission/ Duration of stay.on 04/29/13  Next Review Date:05/02/13  Lacinda Axon, RN, BSN

## 2013-04-30 NOTE — Progress Notes (Signed)
Pt did not attend group, He was asleep in bed at the time of group.

## 2013-05-02 ENCOUNTER — Encounter (HOSPITAL_COMMUNITY): Payer: Self-pay | Admitting: Psychiatry

## 2013-05-02 ENCOUNTER — Ambulatory Visit (INDEPENDENT_AMBULATORY_CARE_PROVIDER_SITE_OTHER): Payer: Medicare Other | Admitting: Psychiatry

## 2013-05-02 VITALS — BP 130/80 | Ht 75.0 in | Wt 173.0 lb

## 2013-05-02 DIAGNOSIS — F332 Major depressive disorder, recurrent severe without psychotic features: Secondary | ICD-10-CM

## 2013-05-02 MED ORDER — ARIPIPRAZOLE 2 MG PO TABS: 2.0000 mg | ORAL_TABLET | Freq: Every day | ORAL | Status: DC

## 2013-05-02 MED ORDER — ESCITALOPRAM OXALATE 10 MG PO TABS: 10.0000 mg | ORAL_TABLET | Freq: Every day | ORAL | Status: DC

## 2013-05-02 NOTE — Progress Notes (Signed)
Psychiatric Assessment Adult  Patient Identification:  Edward Trevino Date of Evaluation:  05/02/2013 Chief Complaint: "I've been depressed." History of Chief Complaint:   Chief Complaint  Patient presents with  . Depression  . Establish Care    HPI this patient is a 79 year old widowed white male who lives alone in [place]. He has a son and daughter-in-law who live nearby as well as a friend who lives on his property. He has 5 sons altogether. He is retired from the [employer] and also worked as a IT trainer for years.  The patient was recently in the behavioral health hospital from December 12 December 16. He had seen his primary physician and told him that he had become increasingly depressed and unable to function. He was placed on Lexapro and Abilify during the hospital stay and seemed to improve. He notes that he feels dizzy after he takes the Lexapro in the morning and has to get up very slowly He's had 4 previous hospitalizations and one previous suicide attempt.  The patient states that he's not happy with his current life. His wife died several years ago and he doesn't remember when. Ever since however he's been lonely and depressed. He claims his children don't visit all that much. His son took away his driver's license and his shotgun. He feels like he can't get out and do anything on his own. The man who lives on his property takes him around to do grocery shopping. The only thing he enjoys in life are his 2 dogs.  Patient claims he is eating and sleeping fairly well. 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 He claims he can't talk to his son because he is "too busy. He feels useless. At this point he denies suicidal ideation. He doesn't much like the idea of coming here and claims he tried to go back to the hospital if things went wrong. I explained that we need to try to prevent hospitalization. He also has memory loss and maybe an early stage of  dementia. Review of Systems  Constitutional: Positive for fatigue.  Psychiatric/Behavioral: Positive for dysphoric mood.   Physical Exam not done  Depressive Symptoms: depressed mood, anhedonia, insomnia, fatigue, feelings of worthlessness/guilt, hopelessness,  (Hypo) Manic Symptoms:   Elevated Mood:  No Irritable Mood:  Yes Grandiosity:  No Distractibility:  No Labiality of Mood:  No Delusions:  No Hallucinations:  No Impulsivity:  No Sexually Inappropriate Behavior:  No Financial Extravagance:  No Flight of Ideas:  No  Anxiety Symptoms: Excessive Worry:  Yes Panic Symptoms:  No Agoraphobia:  No Obsessive Compulsive: No  Symptoms: None, Specific Phobias:  No Social Anxiety:  No  Psychotic Symptoms:  Hallucinations: No None Delusions:  No Paranoia:  No   Ideas of Reference:  No  PTSD Symptoms: Ever had a traumatic exposure:  No Had a traumatic exposure in the last month:  No Re-experiencing: No None Hypervigilance:  No Hyperarousal: No None Avoidance: No None  Traumatic Brain Injury: No   Past Psychiatric History: Diagnosis: Maj. depression   Hospitalizations: Four  previous hospitalizations at the behavioral health hospital   Outpatient Care: He's been reluctant to follow through   Substance Abuse Care: No  Self-Mutilation: No   Suicidal Attempts: Yes   Violent Behaviors: No    Past Medical History:   Past Medical History  Diagnosis Date  . Depression   . Insomnia   . Anxiety   . Suicide attempt   .  Erosive esophagitis 07/25/2012  . Gastric out let obstruction 07/25/2012  . HOH (hard of hearing)    History of Loss of Consciousness:  No Seizure History:  No Cardiac History:  No Allergies:  No Known Allergies Current Medications:  Current Outpatient Prescriptions  Medication Sig Dispense Refill  . ARIPiprazole (ABILIFY) 2 MG tablet Take 1 tablet (2 mg total) by mouth at bedtime. For adjunctive treatment of depression.  30 tablet  2  .  escitalopram (LEXAPRO) 10 MG tablet Take 1 tablet (10 mg total) by mouth at bedtime. For depression.  30 tablet  2   No current facility-administered medications for this visit.    Previous Psychotropic Medications:  Medication Dose   Lexapro   10 mg every morning   Abilify   2 mg each bedtime                   Substance Abuse History in the last 12 months: Substance Age of 1st Use Last Use Amount Specific Type  Nicotine      Alcohol      Cannabis      Opiates      Cocaine      Methamphetamines      LSD      Ecstasy      Benzodiazepines      Caffeine      Inhalants      Others:                          Medical Consequences of Substance Abuse: n/a Legal Consequences of Substance Abuse: n/a  Family Consequences of Substance Abuse: n/a  Blackouts:  No DT's:  No Withdrawal Symptoms:  No None  Social History: Current Place of Residence: [place] of Birth: [place] Family Members: 5 sons Marital Status:  Widowed Children:   Sons: 5  Daughters:  Relationships:  Education:  GED Educational Problems/Performance:  Religious Beliefs/Practices: Christian History of Abuse: none Occupational Experiences; 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 Colon cancer Neg Hx   . Depression Sister     Mental Status Examination/Evaluation: Objective:  Appearance: Disheveled, walks slowly and stiffly got dizzy when standing up from the chair   Eye Contact::  Poor  Speech:  Clear and Coherent and Slow  Volume:  Normal  Mood:  Depressed and irritable   Affect:  Constricted  Thought Process:  Goal Directed  Orientation:  Full (Time, Place, and Person)  Thought Content:  Negative  Suicidal Thoughts:  No  Homicidal Thoughts:  No  Judgement:  Fair  Insight:  Lacking  Psychomotor Activity:  Decreased   Akathisia:  No  Handed:  Right  AIMS (if indicated):    Assets:  Social Support    Laboratory/X-Ray Psychological Evaluation(s)        Assessment:  Axis I: Major Depression, Recurrent severe  AXIS I Major Depression, Recurrent severe  AXIS II Deferred  AXIS III Past Medical History  Diagnosis Date  . Depression   . Insomnia   . Anxiety   . Suicide attempt   . Erosive esophagitis 07/25/2012  . Gastric out let obstruction 07/25/2012  . HOH (hard of hearing)      AXIS IV other psychosocial or environmental problems  AXIS V 51-60 moderate symptoms   Treatment Plan/Recommendations:  Plan of Care  medication management   Laboratory: Tests done at the hospital and reviewed   Psychotherapy: He declines   Medications: He'll continue Abilify Lexapro but take them both at bedtime.  Routine PRN Medications:  No  Consultations:   Safety Concerns:  He is at high-risk because of his age and susceptibility to depressive thoughts. He'll need to come back frequently   Other: I've asked him to bring his son with him next time but he is reluctant. He needs to get out in the community more. He claims his friend is taking them out to meet with others at past with restaurants.he will return in four-week     Diannia Ruder, MD 12/18/201411:48 AM

## 2013-05-02 NOTE — Patient Instructions (Signed)
Patient is to take both meds at bedtime

## 2013-05-03 NOTE — Progress Notes (Signed)
Patient Discharge Instructions:  Next Level Care Provider Has Access to the EMR, 05/03/13 Records provided to [facility] via [facility]/Epic access.  Edward Trevino, 05/03/2013, 12:20 PM

## 2013-05-29 ENCOUNTER — Ambulatory Visit (HOSPITAL_COMMUNITY): Payer: Self-pay | Admitting: Psychiatry

## 2013-06-06 ENCOUNTER — Encounter (HOSPITAL_COMMUNITY): Payer: Self-pay | Admitting: Psychiatry

## 2013-06-06 ENCOUNTER — Ambulatory Visit (INDEPENDENT_AMBULATORY_CARE_PROVIDER_SITE_OTHER): Payer: Medicare Other | Admitting: Psychiatry

## 2013-06-06 VITALS — BP 110/80 | Ht 75.0 in | Wt 176.0 lb

## 2013-06-06 DIAGNOSIS — F332 Major depressive disorder, recurrent severe without psychotic features: Secondary | ICD-10-CM

## 2013-06-06 MED ORDER — QUETIAPINE FUMARATE 100 MG PO TABS: 100.0000 mg | ORAL_TABLET | Freq: Three times a day (TID) | ORAL | Status: DC

## 2013-06-06 MED ORDER — VENLAFAXINE HCL ER 75 MG PO CP24: 75.0000 mg | ORAL_CAPSULE | Freq: Every day | ORAL | Status: DC

## 2013-06-06 MED ORDER — MIRTAZAPINE 30 MG PO TABS: 30.0000 mg | ORAL_TABLET | Freq: Every day | ORAL | Status: AC

## 2013-06-06 NOTE — Progress Notes (Signed)
Patient ID: Edward Trevino, male   DOB: 1931-12-12, 80 y.o.   MRN: [id number]  Psychiatric Assessment Adult  Patient Identification:  Edward Trevino Date of Evaluation:  06/06/2013 Chief Complaint: "I've been depressed." History of Chief Complaint:   Chief Complaint  Patient presents with  . Anxiety  . Depression  . Memory Loss  . Follow-up    Anxiety     this patient is a 80 year old widowed white male who lives alone in [place]. He has a son and daughter-in-law who live nearby as well as a friend who lives on his property. He has 5 sons altogether. He is retired from the [employer] and also worked as a Pharmacist, community for years.  The patient was recently in the behavioral health hospital from December 12 December 16. He had seen his primary physician and told him that he had become increasingly depressed and unable to function. He was placed on Lexapro and Abilify during the hospital stay and seemed to improve. He notes that he feels dizzy after he takes the Lexapro in the morning and has to get up very slowly He's had 4 previous hospitalizations and one previous suicide attempt.  The patient states that he's not happy with his current life. His wife died several years ago and he doesn't remember when. Ever since however he's been lonely and depressed. He claims his children don't visit all that much. His son took away his driver's license and his shotgun. He feels like he can't get out and do anything on his own. The man who lives on his property takes him around to do grocery shopping. The only thing he enjoys in life are his 2 dogs.  Patient claims he is eating and sleeping fairly well. 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 He claims he can't talk to his son because he is "too busy. He feels useless. At this point he denies suicidal ideation. He doesn't much like the idea of coming here and claims he tried to go back to the hospital if things went wrong. I explained  that we need to try to prevent hospitalization. He also has memory loss and maybe an early stage of dementia.  Patient returns after 78-week's. His son sent a note stating they had to stop the Abilify because it made him more delusional and confused. The patient states he still depressed and sleeps all the time and then can't sleep at night. I spoke to the son-time he and his wife  Edward Trevino at length on the phone. They say that the patient used benzodiazepines and stimulants for years. He took a large overdose of Xanax several years ago. They claim he is not doing well now but did much better on a combination of Effexor XR, Seroquel and mirtazapine that he took up until June. A lot of these medicines were stopped after his gallbladder surgery. His daughter-in-law would really like to see him back on these meds so he could function better again. Review of Systems  Constitutional: Positive for fatigue.  Psychiatric/Behavioral: Positive for dysphoric mood.   Physical Exam not done  Depressive Symptoms: depressed mood, anhedonia, insomnia, fatigue, feelings of worthlessness/guilt, hopelessness,  (Hypo) Manic Symptoms:   Elevated Mood:  No Irritable Mood:  Yes Grandiosity:  No Distractibility:  No Labiality of Mood:  No Delusions:  No Hallucinations:  No Impulsivity:  No Sexually Inappropriate Behavior:  No Financial Extravagance:  No Flight of Ideas:  No  Anxiety Symptoms:  Excessive Worry:  Yes Panic Symptoms:  No Agoraphobia:  No Obsessive Compulsive: No  Symptoms: None, Specific Phobias:  No Social Anxiety:  No  Psychotic Symptoms:  Hallucinations: No None Delusions:  No Paranoia:  No   Ideas of Reference:  No  PTSD Symptoms: Ever had a traumatic exposure:  No Had a traumatic exposure in the last month:  No Re-experiencing: No None Hypervigilance:  No Hyperarousal: No None Avoidance: No None  Traumatic Brain Injury: No   Past Psychiatric History: Diagnosis: Maj.  depression   Hospitalizations: Four  previous hospitalizations at the behavioral health hospital   Outpatient Care: He's been reluctant to follow through   Substance Abuse Care: No  Self-Mutilation: No   Suicidal Attempts: Yes   Violent Behaviors: No    Past Medical History:   Past Medical History  Diagnosis Date  . Depression   . Insomnia   . Anxiety   . Suicide attempt   . Erosive esophagitis 07/25/2012  . Gastric out let obstruction 07/25/2012  . HOH (hard of hearing)    History of Loss of Consciousness:  No Seizure History:  No Cardiac History:  No Allergies:  No Known Allergies Current Medications:  Current Outpatient Prescriptions  Medication Sig Dispense Refill  . megestrol (MEGACE) 40 MG tablet Take 40 mg by mouth daily.      . mirtazapine (REMERON) 30 MG tablet Take 1 tablet (30 mg total) by mouth at bedtime.  30 tablet  2  . QUEtiapine (SEROQUEL) 100 MG tablet Take 1 tablet (100 mg total) by mouth 3 (three) times daily.  90 tablet  2  . venlafaxine XR (EFFEXOR XR) 75 MG 24 hr capsule Take 1 capsule (75 mg total) by mouth daily.  30 capsule  2   No current facility-administered medications for this visit.    Previous Psychotropic Medications:  Medication Dose   Lexapro   10 mg every morning   Abilify   2 mg each bedtime                   Substance Abuse History in the last 12 months: Substance Age of 1st Use Last Use Amount Specific Type  Nicotine      Alcohol      Cannabis      Opiates      Cocaine      Methamphetamines      LSD      Ecstasy      Benzodiazepines      Caffeine      Inhalants      Others:                          Medical Consequences of Substance Abuse: n/a Legal Consequences of Substance Abuse: n/a  Family Consequences of Substance Abuse: n/a  Blackouts:  No DT's:  No Withdrawal Symptoms:  No None  Social History: Current Place of Residence: [place] of Birth: [place] Family Members:  5 sons Marital Status:  Widowed Children:   Sons: 5  Daughters:  Relationships:  Education:  GED Educational Problems/Performance:  Religious Beliefs/Practices: Christian History of Abuse: none Occupational Experiences; 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 Colon cancer Neg Hx   . Depression Sister     Mental Status Examination/Evaluation: Objective:  Appearance: Disheveled, walks slowly and  stiffly   Eye Contact::  Poor  Speech:  Clear and Coherent and Slow  Volume:  Normal  Mood:  Depressed but less irritable his time   Affect:  Constricted  Thought Process:  Goal Directed  Orientation:  Full (Time, Place, and Person) but easily confused   Thought Content:  Negative  Suicidal Thoughts:  No  Homicidal Thoughts:  No  Judgement:  Fair  Insight:  Lacking  Psychomotor Activity:  Decreased  Akathisia:  No  Handed:  Right  AIMS (if indicated):    Assets:  Social Support    Laboratory/X-Ray Psychological Evaluation(s)        Assessment:  Axis I: Major Depression, Recurrent severe  AXIS I Major Depression, Recurrent severe  AXIS II Deferred  AXIS III Past Medical History  Diagnosis Date  . Depression   . Insomnia   . Anxiety   . Suicide attempt   . Erosive esophagitis 07/25/2012  . Gastric out let obstruction 07/25/2012  . HOH (hard of hearing)      AXIS IV other psychosocial or environmental problems  AXIS V 51-60 moderate symptoms   Treatment Plan/Recommendations:  Plan of Care medication management   Laboratory: Tests done at the hospital and reviewed   Psychotherapy: He declines   Medications: He will discontinue Abilify and start Seroquel 100 mg 3 times a day, Remeron 30 mg each bedtime and Effexor XR 75 mg every morning. His son and daughter-in-law will be responsible for dispensing his medicines.   Routine PRN  Medications:  No  Consultations:   Safety Concerns:  He is at high-risk because of his age and susceptibility to depressive thoughts. He'll need to come back frequently   Other: He is to return in four-week's. His son or daughter-in-law will come to the appointment     Levonne Spiller, MD 1/22/201510:38 AM

## 2013-07-03 ENCOUNTER — Ambulatory Visit (HOSPITAL_COMMUNITY): Payer: Self-pay | Admitting: Psychiatry

## 2013-07-04 ENCOUNTER — Encounter (HOSPITAL_COMMUNITY): Payer: Self-pay | Admitting: Psychiatry

## 2013-07-30 DIAGNOSIS — I739 Peripheral vascular disease, unspecified: Secondary | ICD-10-CM | POA: Diagnosis not present

## 2013-09-02 ENCOUNTER — Other Ambulatory Visit (HOSPITAL_COMMUNITY): Payer: Self-pay | Admitting: Psychiatry

## 2013-09-03 ENCOUNTER — Other Ambulatory Visit (HOSPITAL_COMMUNITY): Payer: Self-pay | Admitting: Psychiatry

## 2013-09-03 ENCOUNTER — Telehealth (HOSPITAL_COMMUNITY): Payer: Self-pay | Admitting: *Deleted

## 2013-09-03 MED ORDER — VENLAFAXINE HCL ER 75 MG PO CP24: 75.0000 mg | ORAL_CAPSULE | Freq: Every day | ORAL | Status: DC

## 2013-09-03 NOTE — Telephone Encounter (Signed)
done

## 2013-09-16 DIAGNOSIS — H35039 Hypertensive retinopathy, unspecified eye: Secondary | ICD-10-CM | POA: Diagnosis not present

## 2013-09-23 DIAGNOSIS — F3289 Other specified depressive episodes: Secondary | ICD-10-CM | POA: Diagnosis not present

## 2013-09-23 DIAGNOSIS — I739 Peripheral vascular disease, unspecified: Secondary | ICD-10-CM | POA: Diagnosis not present

## 2013-09-23 DIAGNOSIS — F329 Major depressive disorder, single episode, unspecified: Secondary | ICD-10-CM | POA: Diagnosis not present

## 2013-09-23 DIAGNOSIS — G4700 Insomnia, unspecified: Secondary | ICD-10-CM | POA: Diagnosis not present

## 2013-11-14 DIAGNOSIS — H538 Other visual disturbances: Secondary | ICD-10-CM | POA: Diagnosis not present

## 2013-11-14 DIAGNOSIS — H2510 Age-related nuclear cataract, unspecified eye: Secondary | ICD-10-CM | POA: Diagnosis not present

## 2013-11-22 ENCOUNTER — Other Ambulatory Visit (HOSPITAL_COMMUNITY): Payer: Self-pay | Admitting: Psychiatry

## 2013-12-05 DIAGNOSIS — H538 Other visual disturbances: Secondary | ICD-10-CM | POA: Diagnosis not present

## 2013-12-05 DIAGNOSIS — H2510 Age-related nuclear cataract, unspecified eye: Secondary | ICD-10-CM | POA: Diagnosis not present

## 2013-12-09 DIAGNOSIS — Z79899 Other long term (current) drug therapy: Secondary | ICD-10-CM | POA: Diagnosis not present

## 2013-12-09 DIAGNOSIS — H269 Unspecified cataract: Secondary | ICD-10-CM | POA: Diagnosis not present

## 2013-12-09 DIAGNOSIS — F0390 Unspecified dementia without behavioral disturbance: Secondary | ICD-10-CM | POA: Diagnosis not present

## 2013-12-09 DIAGNOSIS — H538 Other visual disturbances: Secondary | ICD-10-CM | POA: Diagnosis not present

## 2013-12-09 DIAGNOSIS — H52209 Unspecified astigmatism, unspecified eye: Secondary | ICD-10-CM | POA: Diagnosis not present

## 2013-12-09 DIAGNOSIS — H25019 Cortical age-related cataract, unspecified eye: Secondary | ICD-10-CM | POA: Diagnosis not present

## 2013-12-09 DIAGNOSIS — F3289 Other specified depressive episodes: Secondary | ICD-10-CM | POA: Diagnosis not present

## 2013-12-09 DIAGNOSIS — F329 Major depressive disorder, single episode, unspecified: Secondary | ICD-10-CM | POA: Diagnosis not present

## 2013-12-09 DIAGNOSIS — H5200 Hypermetropia, unspecified eye: Secondary | ICD-10-CM | POA: Diagnosis not present

## 2013-12-09 DIAGNOSIS — H2510 Age-related nuclear cataract, unspecified eye: Secondary | ICD-10-CM | POA: Diagnosis not present

## 2013-12-09 DIAGNOSIS — F411 Generalized anxiety disorder: Secondary | ICD-10-CM | POA: Diagnosis not present

## 2013-12-09 DIAGNOSIS — H524 Presbyopia: Secondary | ICD-10-CM | POA: Diagnosis not present

## 2014-02-19 DIAGNOSIS — B351 Tinea unguium: Secondary | ICD-10-CM | POA: Diagnosis not present

## 2014-02-19 DIAGNOSIS — I739 Peripheral vascular disease, unspecified: Secondary | ICD-10-CM | POA: Diagnosis not present

## 2014-02-25 DIAGNOSIS — Z23 Encounter for immunization: Secondary | ICD-10-CM | POA: Diagnosis not present

## 2014-06-26 DIAGNOSIS — D649 Anemia, unspecified: Secondary | ICD-10-CM | POA: Diagnosis not present

## 2014-06-26 DIAGNOSIS — I739 Peripheral vascular disease, unspecified: Secondary | ICD-10-CM | POA: Diagnosis not present

## 2014-06-26 DIAGNOSIS — F5104 Psychophysiologic insomnia: Secondary | ICD-10-CM | POA: Diagnosis not present

## 2014-06-26 DIAGNOSIS — F4312 Post-traumatic stress disorder, chronic: Secondary | ICD-10-CM | POA: Diagnosis not present

## 2014-12-31 DIAGNOSIS — I739 Peripheral vascular disease, unspecified: Secondary | ICD-10-CM | POA: Diagnosis not present

## 2014-12-31 DIAGNOSIS — Z720 Tobacco use: Secondary | ICD-10-CM | POA: Diagnosis not present

## 2014-12-31 DIAGNOSIS — F5104 Psychophysiologic insomnia: Secondary | ICD-10-CM | POA: Diagnosis not present

## 2014-12-31 DIAGNOSIS — R634 Abnormal weight loss: Secondary | ICD-10-CM | POA: Diagnosis not present

## 2014-12-31 DIAGNOSIS — F4312 Post-traumatic stress disorder, chronic: Secondary | ICD-10-CM | POA: Diagnosis not present

## 2014-12-31 DIAGNOSIS — F329 Major depressive disorder, single episode, unspecified: Secondary | ICD-10-CM | POA: Diagnosis not present

## 2015-02-16 DIAGNOSIS — Z23 Encounter for immunization: Secondary | ICD-10-CM | POA: Diagnosis not present

## 2015-02-27 DIAGNOSIS — I2109 ST elevation (STEMI) myocardial infarction involving other coronary artery of anterior wall: Secondary | ICD-10-CM | POA: Diagnosis not present

## 2015-02-27 DIAGNOSIS — R079 Chest pain, unspecified: Secondary | ICD-10-CM | POA: Diagnosis not present

## 2015-02-27 DIAGNOSIS — J449 Chronic obstructive pulmonary disease, unspecified: Secondary | ICD-10-CM | POA: Diagnosis not present

## 2015-02-27 DIAGNOSIS — Z9049 Acquired absence of other specified parts of digestive tract: Secondary | ICD-10-CM | POA: Diagnosis not present

## 2015-02-27 DIAGNOSIS — F329 Major depressive disorder, single episode, unspecified: Secondary | ICD-10-CM | POA: Diagnosis not present

## 2015-02-28 ENCOUNTER — Encounter (HOSPITAL_COMMUNITY): Payer: Self-pay | Admitting: *Deleted

## 2015-02-28 ENCOUNTER — Inpatient Hospital Stay (HOSPITAL_COMMUNITY)
Admission: AD | Admit: 2015-02-28 | Discharge: 2015-03-04 | DRG: 280 | Disposition: A | Discharge status: Home Health | Payer: Medicare Other | Source: Other Acute Inpatient Hospital | Attending: Internal Medicine | Admitting: Internal Medicine

## 2015-02-28 ENCOUNTER — Inpatient Hospital Stay (HOSPITAL_COMMUNITY): Payer: Medicare Other

## 2015-02-28 DIAGNOSIS — H9190 Unspecified hearing loss, unspecified ear: Secondary | ICD-10-CM | POA: Diagnosis present

## 2015-02-28 DIAGNOSIS — I2510 Atherosclerotic heart disease of native coronary artery without angina pectoris: Secondary | ICD-10-CM | POA: Diagnosis not present

## 2015-02-28 DIAGNOSIS — F329 Major depressive disorder, single episode, unspecified: Secondary | ICD-10-CM | POA: Diagnosis present

## 2015-02-28 DIAGNOSIS — I5023 Acute on chronic systolic (congestive) heart failure: Secondary | ICD-10-CM | POA: Diagnosis not present

## 2015-02-28 DIAGNOSIS — I5021 Acute systolic (congestive) heart failure: Secondary | ICD-10-CM | POA: Diagnosis present

## 2015-02-28 DIAGNOSIS — I351 Nonrheumatic aortic (valve) insufficiency: Secondary | ICD-10-CM | POA: Diagnosis not present

## 2015-02-28 DIAGNOSIS — F17200 Nicotine dependence, unspecified, uncomplicated: Secondary | ICD-10-CM | POA: Diagnosis present

## 2015-02-28 DIAGNOSIS — I249 Acute ischemic heart disease, unspecified: Secondary | ICD-10-CM | POA: Diagnosis not present

## 2015-02-28 DIAGNOSIS — R0602 Shortness of breath: Secondary | ICD-10-CM | POA: Diagnosis not present

## 2015-02-28 DIAGNOSIS — I2109 ST elevation (STEMI) myocardial infarction involving other coronary artery of anterior wall: Secondary | ICD-10-CM | POA: Diagnosis not present

## 2015-02-28 DIAGNOSIS — R079 Chest pain, unspecified: Secondary | ICD-10-CM

## 2015-02-28 DIAGNOSIS — Z9049 Acquired absence of other specified parts of digestive tract: Secondary | ICD-10-CM | POA: Diagnosis not present

## 2015-02-28 DIAGNOSIS — I2129 ST elevation (STEMI) myocardial infarction involving other sites: Principal | ICD-10-CM | POA: Diagnosis present

## 2015-02-28 LAB — COMPREHENSIVE METABOLIC PANEL
ALBUMIN: 3.3 g/dL — AB (ref 3.5–5.0)
ALT: 54 U/L (ref 17–63)
AST: 226 U/L — ABNORMAL HIGH (ref 15–41)
Alkaline Phosphatase: 122 U/L (ref 38–126)
Anion gap: 6 (ref 5–15)
BUN: 12 mg/dL (ref 6–20)
CO2: 19 mmol/L — ABNORMAL LOW (ref 22–32)
Calcium: 7.6 mg/dL — ABNORMAL LOW (ref 8.9–10.3)
Chloride: 111 mmol/L (ref 101–111)
Creatinine, Ser: 1.17 mg/dL (ref 0.61–1.24)
GFR calc Af Amer: >60 mL/min (ref >60)
GFR calc non Af Amer: 57 mL/min — ABNORMAL LOW (ref >60)
GLUCOSE: 121 mg/dL — AB (ref 65–99)
POTASSIUM: 5.2 mmol/L — AB (ref 3.5–5.1)
SODIUM: 136 mmol/L (ref 135–145)
TOTAL PROTEIN: 5.7 g/dL — AB (ref 6.5–8.1)
Total Bilirubin: 1.3 mg/dL — ABNORMAL HIGH (ref 0.3–1.2)

## 2015-02-28 LAB — CBC WITH DIFFERENTIAL/PLATELET
Basophils Absolute: 0 10*3/uL (ref 0.0–0.1)
Basophils Relative: 0 %
Eosinophils Absolute: 0.1 10*3/uL (ref 0.0–0.7)
Eosinophils Relative: 1 %
HCT: 38.7 % — ABNORMAL LOW (ref 39.0–52.0)
Hemoglobin: 12.7 g/dL — ABNORMAL LOW (ref 13.0–17.0)
LYMPHS ABS: 2.2 10*3/uL (ref 0.7–4.0)
Lymphocytes Relative: 15 %
MCH: 32.2 pg (ref 26.0–34.0)
MCHC: 32.8 g/dL (ref 30.0–36.0)
MCV: 98 fL (ref 78.0–100.0)
Monocytes Absolute: 1.2 10*3/uL — ABNORMAL HIGH (ref 0.1–1.0)
Monocytes Relative: 9 %
NEUTROS ABS: 10.6 10*3/uL — AB (ref 1.7–7.7)
Neutrophils Relative %: 75 %
Platelets: 220 10*3/uL (ref 150–400)
RBC: 3.95 MIL/uL — AB (ref 4.22–5.81)
RDW: 13 % (ref 11.5–15.5)
WBC: 14.1 10*3/uL — ABNORMAL HIGH (ref 4.0–10.5)

## 2015-02-28 LAB — HEPARIN LEVEL (UNFRACTIONATED): HEPARIN UNFRACTIONATED: 2.01 [IU]/mL — AB (ref 0.30–0.70)

## 2015-02-28 LAB — TROPONIN I
Troponin I: 48.61 ng/mL (ref <0.031)
Troponin I: 47.14 ng/mL (ref <0.031)

## 2015-02-28 LAB — BRAIN NATRIURETIC PEPTIDE: B Natriuretic Peptide: 531.1 pg/mL — ABNORMAL HIGH (ref 0.0–100.0)

## 2015-02-28 LAB — MRSA PCR SCREENING: MRSA by PCR: NEGATIVE

## 2015-02-28 MED ORDER — VENLAFAXINE HCL ER 75 MG PO CP24
75.0000 mg | ORAL_CAPSULE | Freq: Every day | ORAL | Status: DC
  Administered 2015-02-28 – 2015-03-03 (×4): 75 mg via ORAL
  Filled 2015-02-28 (×6): qty 1

## 2015-02-28 MED ORDER — PERFLUTREN LIPID MICROSPHERE
INTRAVENOUS | Status: AC
  Filled 2015-02-28: qty 10

## 2015-02-28 MED ORDER — HEPARIN (PORCINE) IN NACL 100-0.45 UNIT/ML-% IJ SOLN
1400.0000 [IU]/h | INTRAMUSCULAR | Status: DC
  Administered 2015-02-28: 1400 [IU]/h via INTRAVENOUS

## 2015-02-28 MED ORDER — ASPIRIN EC 81 MG PO TBEC: 81.0000 mg | DELAYED_RELEASE_TABLET | Freq: Every day | ORAL | Status: DC

## 2015-02-28 MED ORDER — MIRTAZAPINE 30 MG PO TABS
30.0000 mg | ORAL_TABLET | Freq: Every day | ORAL | Status: DC
  Administered 2015-02-28 – 2015-03-03 (×4): 30 mg via ORAL
  Filled 2015-02-28 (×6): qty 1

## 2015-02-28 MED ORDER — HYDROCODONE-ACETAMINOPHEN 5-325 MG PO TABS
1.0000 | ORAL_TABLET | Freq: Four times a day (QID) | ORAL | Status: DC | PRN
  Administered 2015-02-28: 2 via ORAL
  Filled 2015-02-28: qty 2

## 2015-02-28 MED ORDER — MEGESTROL ACETATE 40 MG PO TABS
40.0000 mg | ORAL_TABLET | Freq: Every day | ORAL | Status: DC
  Filled 2015-02-28: qty 1

## 2015-02-28 MED ORDER — ATORVASTATIN CALCIUM 80 MG PO TABS
80.0000 mg | ORAL_TABLET | Freq: Every day | ORAL | Status: DC
  Administered 2015-02-28 – 2015-03-04 (×5): 80 mg via ORAL
  Filled 2015-02-28 (×5): qty 1

## 2015-02-28 MED ORDER — HEPARIN (PORCINE) IN NACL 100-0.45 UNIT/ML-% IJ SOLN: 1400.0000 [IU]/h | INTRAMUSCULAR | Status: DC

## 2015-02-28 MED ORDER — NITROGLYCERIN 0.4 MG SL SUBL: 0.4000 mg | SUBLINGUAL_TABLET | SUBLINGUAL | Status: DC | PRN

## 2015-02-28 MED ORDER — SODIUM CHLORIDE 0.9 % IV SOLN
INTRAVENOUS | Status: DC
  Administered 2015-02-28 – 2015-03-01 (×2): via INTRAVENOUS

## 2015-02-28 MED ORDER — QUETIAPINE FUMARATE 25 MG PO TABS
100.0000 mg | ORAL_TABLET | Freq: Every day | ORAL | Status: DC
  Administered 2015-02-28 – 2015-03-03 (×4): 100 mg via ORAL
  Filled 2015-02-28: qty 1
  Filled 2015-02-28: qty 4
  Filled 2015-02-28 (×2): qty 1

## 2015-02-28 MED ORDER — CETYLPYRIDINIUM CHLORIDE 0.05 % MT LIQD
7.0000 mL | Freq: Two times a day (BID) | OROMUCOSAL | Status: DC
  Administered 2015-03-01 – 2015-03-03 (×6): 7 mL via OROMUCOSAL

## 2015-02-28 MED ORDER — NOREPINEPHRINE BITARTRATE 1 MG/ML IV SOLN
0.0000 ug/min | INTRAVENOUS | Status: DC
  Administered 2015-02-28: 6 ug/min via INTRAVENOUS
  Administered 2015-03-02: 7 ug/min via INTRAVENOUS
  Filled 2015-02-28 (×2): qty 4

## 2015-02-28 MED ORDER — ONDANSETRON HCL 4 MG/2ML IJ SOLN: 4.0000 mg | Freq: Four times a day (QID) | INTRAMUSCULAR | Status: DC | PRN

## 2015-02-28 MED ORDER — CLOPIDOGREL BISULFATE 300 MG PO TABS
600.0000 mg | ORAL_TABLET | Freq: Once | ORAL | Status: AC
  Administered 2015-02-28: 600 mg via ORAL
  Filled 2015-02-28: qty 2

## 2015-02-28 MED ORDER — ASPIRIN EC 81 MG PO TBEC
81.0000 mg | DELAYED_RELEASE_TABLET | Freq: Every day | ORAL | Status: DC
  Administered 2015-02-28 – 2015-03-04 (×5): 81 mg via ORAL
  Filled 2015-02-28 (×5): qty 1

## 2015-02-28 MED ORDER — SODIUM CHLORIDE 0.9 % IJ SOLN: 10.0000 mL | INTRAMUSCULAR | Status: DC | PRN

## 2015-02-28 MED ORDER — ACETAMINOPHEN 325 MG PO TABS: 650.0000 mg | ORAL_TABLET | ORAL | Status: DC | PRN

## 2015-02-28 MED ORDER — NITROGLYCERIN IN D5W 200-5 MCG/ML-% IV SOLN
0.0000 ug/min | INTRAVENOUS | Status: DC
  Administered 2015-02-28: 5 ug/min via INTRAVENOUS
  Filled 2015-02-28: qty 250

## 2015-02-28 MED ORDER — PERFLUTREN LIPID MICROSPHERE
1.0000 mL | INTRAVENOUS | Status: AC | PRN
  Administered 2015-02-28: 2 mL via INTRAVENOUS
  Filled 2015-02-28: qty 10

## 2015-02-28 MED ORDER — SODIUM CHLORIDE 0.9 % IJ SOLN
10.0000 mL | Freq: Two times a day (BID) | INTRAMUSCULAR | Status: DC
  Administered 2015-02-28 – 2015-03-02 (×5): 10 mL
  Administered 2015-03-03 – 2015-03-04 (×2): 20 mL

## 2015-02-28 MED ORDER — MORPHINE SULFATE (PF) 4 MG/ML IV SOLN
4.0000 mg | INTRAVENOUS | Status: DC | PRN
  Administered 2015-02-28: 4 mg via INTRAVENOUS
  Filled 2015-02-28: qty 1

## 2015-02-28 NOTE — Progress Notes (Signed)
Peripherally Inserted Central Catheter/Midline Placement  The IV Nurse has discussed with the patient and/or persons authorized to consent for the patient, the purpose of this procedure and the potential benefits and risks involved with this procedure.  The benefits include less needle sticks, lab draws from the catheter and patient may be discharged home with the catheter.  Risks include, but not limited to, infection, bleeding, blood clot (thrombus formation), and puncture of an artery; nerve damage and irregular heat beat.  Alternatives to this procedure were also discussed.  Spoke with son, Konrad Dolores on phone to obtain consent.  PICC/Midline Placement Documentation  PICC / Midline Double Lumen 49/67/59 PICC Right Basilic 42 cm 0 cm (Active)  Indication for Insertion or Continuance of Line Vasoactive infusions;Prolonged intravenous therapies 02/28/2015  5:29 PM  Exposed Catheter (cm) 0 cm 02/28/2015  5:29 PM  Site Assessment Clean;Dry;Intact 02/28/2015  5:29 PM  Lumen #1 Status Flushed;Saline locked;Blood return noted 02/28/2015  5:29 PM  Lumen #2 Status Flushed;Saline locked;Blood return noted 02/28/2015  5:29 PM  Dressing Type Transparent 02/28/2015  5:29 PM  Dressing Status Clean;Dry;Intact;Antimicrobial disc in place 02/28/2015  5:29 PM  Line Care Connections checked and tightened 02/28/2015  5:29 PM  Line Adjustment (NICU/IV Team Only) No 02/28/2015  5:29 PM  Dressing Intervention New dressing 02/28/2015  5:29 PM  Dressing Change Due 03/07/15 02/28/2015  5:29 PM       Rolena Infante 02/28/2015, 5:29 PM

## 2015-02-28 NOTE — Progress Notes (Signed)
Notified PA Meng of pt having chest pain.  Orders received for Morphine.  Will give and continue to monitor.

## 2015-02-28 NOTE — Progress Notes (Addendum)
ANTICOAGULATION CONSULT NOTE - Initial Consult  Pharmacy Consult for Heparin Indication: chest pain/ACS  No Known Allergies  Patient Measurements: Height: 6\' 2"  (188 cm) Weight: 204 lb 9.4 oz (92.8 kg) IBW/kg (Calculated) : 82.2   Vital Signs: Temp: 98.1 F (36.7 C) (10/15 1200) Temp Source: Oral (10/15 1200) BP: 94/55 mmHg (10/15 1530) Pulse Rate: 70 (10/15 1530)  Labs:  Recent Labs  02/28/15 1115 02/28/15 1430  HGB 12.7*  --   HCT 38.7*  --   PLT 220  --   HEPARINUNFRC  --  2.01*  CREATININE 1.17  --   TROPONINI 48.61*  --     Estimated Creatinine Clearance: 57.6 mL/min (by C-G formula based on Cr of 1.17).   Medical History: Past Medical History  Diagnosis Date  . Depression   . Insomnia   . Anxiety   . Suicide attempt ([place])   . Erosive esophagitis 07/25/2012  . Gastric out let obstruction 07/25/2012  . HOH (hard of hearing)      Assessment: 81 year old male transferred from [facility] with AMI on heparin Heparin level  = 2.01 (drawn correctly, but a difficult stick)  Goal of Therapy:  Heparin level 0.3-0.7 units/ml Monitor platelets by anticoagulation protocol: Yes   Plan:  Hold heparin x 1 hour Decrease heparin drip to 1400 units / hr Next heparin level at midnight  Thank you Anette Guarneri, PharmD [phone]  02/28/2015,4:20 PM

## 2015-02-28 NOTE — Progress Notes (Signed)
CRITICAL VALUE ALERT  Critical value received:  Trop 48.61  Date of notification:  02/28/2015  Time of notification:  9242  Critical value read back:Yes.    Nurse who received alert:  Bobbe Medico, RN  MD notified (1st page):  Dr. Harrington Challenger  Time of first page:  1200  MD notified (2nd page):  Time of second page:  Responding MD:  Dr. Harrington Challenger  Time MD responded:  6834

## 2015-02-28 NOTE — Progress Notes (Signed)
  Echocardiogram 2D Echocardiogram with Definity has been performed.  Teshaun Olarte 02/28/2015, 10:57 AM

## 2015-02-28 NOTE — Progress Notes (Signed)
Pt states chest pain is unchanged since morphine.  PA Meng notified.  Orders received for Norco. Will give and continue to monitor.

## 2015-02-28 NOTE — Progress Notes (Signed)
Pt states pain is unchanged since norco given.  PA Meng notified.  Dr. Harrington Challenger gave orders for nitro drip.  Will continue to monitor.

## 2015-02-28 NOTE — H&P (Signed)
Primary Physician: Primary Cardiologist:  New   HPI:  Patient is an 81 yo with no prior cardiac history   Yesterday was working in yard  Around 1 PM developed L sided chest discomfort.   Went to [facility] in [place]  Initial EKG showed anteroseptal MI and ST elevaton in V2/ V3.   He was followed there  Trop elevated at greater than 1  This AM was reported pain free but then redeveloped  Tx for [facility] for further care ON way to hosp has complained of 8/10 CP  L sided to shoulder.  Some is pleuritic Breathing is fair.     Past Medical History  Diagnosis Date  . Depression   . Insomnia   . Anxiety   . Suicide attempt ([place])   . Erosive esophagitis 07/25/2012  . Gastric out let obstruction 07/25/2012  . HOH (hard of hearing)     Medications Prior to Admission  Medication Sig Dispense Refill  . megestrol (MEGACE) 40 MG tablet Take 40 mg by mouth daily.       Marland Kitchen antiseptic oral rinse  7 mL Mouth Rinse BID  . aspirin EC  81 mg Oral Daily  . atorvastatin  80 mg Oral q1800  . megestrol  40 mg Oral Daily  . QUEtiapine  100 mg Oral QHS    Infusions: . sodium chloride 100 mL/hr at 02/28/15 1328  . heparin    . nitroGLYCERIN 5 mcg/min (02/28/15 1327)  . norepinephrine (LEVOPHED) Adult infusion 2 mcg/min (02/28/15 1500)    No Known Allergies  Social History   Social History  . Marital Status: Single    Spouse Name: N/A  . Number of Children: N/A  . Years of Education: N/A   Occupational History  . retired    Social History Main Topics  . Smoking status: Current Every Day Smoker    Types: Cigars  . Smokeless tobacco: Never Used  . Alcohol Use: No     Comment: reported from ED son stated he has been drinking a lot of wine recently  . Drug Use: No  . Sexual Activity: No   Other Topics Concern  . Not on file   Social History Narrative    Family History  Problem Relation Age of Onset  . Colon cancer Neg Hx   . Depression Sister     REVIEW OF SYSTEMS:   All systems reviewed  Negative to the above problem except as noted above.    PHYSICAL EXAM: Filed Vitals:   02/28/15 1645  BP: 109/66  Pulse: 86  Temp:   Resp: 16     Intake/Output Summary (Last 24 hours) at 02/28/15 1700 Last data filed at 02/28/15 1600  Gross per 24 hour  Intake 302.54 ml  Output      0 ml  Net 302.54 ml    General:  81 yo complaining HEENT: normal Neck: supple. no JVD. Carotids 2+ bilat; no bruits. No lymphadenopathy or thryomegaly appreciated. Cor: PMI nondisplaced. Regular rate & rhythm. No rubs, gallops or murmurs. Lungs: clear Abdomen: soft, nontender, nondistended. No hepatosplenomegaly. No bruits or masses. Good bowel sounds. Extremities: no cyanosis, clubbing, rash, edema Neuro: alert & oriented x 3, cranial nerves grossly intact. moves all 4 extremities w/o difficulty. Affect pleasant.  ECG:  [facility] from [facility]  SR  Q waves through anterior, anterolateral precordial leads    Results for orders placed or performed during the hospital encounter of 02/28/15 (from the past  24 hour(s))  MRSA PCR Screening     Status: None   Collection Time: 02/28/15  9:53 AM  Result Value Ref Range   MRSA by PCR NEGATIVE NEGATIVE  Comprehensive metabolic panel     Status: Abnormal   Collection Time: 02/28/15 11:15 AM  Result Value Ref Range   Sodium 136 135 - 145 mmol/L   Potassium 5.2 (H) 3.5 - 5.1 mmol/L   Chloride 111 101 - 111 mmol/L   CO2 19 (L) 22 - 32 mmol/L   Glucose, Bld 121 (H) 65 - 99 mg/dL   BUN 12 6 - 20 mg/dL   Creatinine, Ser 1.17 0.61 - 1.24 mg/dL   Calcium 7.6 (L) 8.9 - 10.3 mg/dL   Total Protein 5.7 (L) 6.5 - 8.1 g/dL   Albumin 3.3 (L) 3.5 - 5.0 g/dL   AST 226 (H) 15 - 41 U/L   ALT 54 17 - 63 U/L   Alkaline Phosphatase 122 38 - 126 U/L   Total Bilirubin 1.3 (H) 0.3 - 1.2 mg/dL   GFR calc non Af Amer 57 (L) >60 mL/min   GFR calc Af Amer >60 >60 mL/min   Anion gap 6 5 - 15  Troponin I     Status: Abnormal   Collection Time: 02/28/15  11:15 AM  Result Value Ref Range   Troponin I 48.61 (HH) <0.031 ng/mL  CBC WITH DIFFERENTIAL     Status: Abnormal   Collection Time: 02/28/15 11:15 AM  Result Value Ref Range   WBC 14.1 (H) 4.0 - 10.5 K/uL   RBC 3.95 (L) 4.22 - 5.81 MIL/uL   Hemoglobin 12.7 (L) 13.0 - 17.0 g/dL   HCT 38.7 (L) 39.0 - 52.0 %   MCV 98.0 78.0 - 100.0 fL   MCH 32.2 26.0 - 34.0 pg   MCHC 32.8 30.0 - 36.0 g/dL   RDW 13.0 11.5 - 15.5 %   Platelets 220 150 - 400 K/uL   Neutrophils Relative % 75 %   Neutro Abs 10.6 (H) 1.7 - 7.7 K/uL   Lymphocytes Relative 15 %   Lymphs Abs 2.2 0.7 - 4.0 K/uL   Monocytes Relative 9 %   Monocytes Absolute 1.2 (H) 0.1 - 1.0 K/uL   Eosinophils Relative 1 %   Eosinophils Absolute 0.1 0.0 - 0.7 K/uL   Basophils Relative 0 %   Basophils Absolute 0.0 0.0 - 0.1 K/uL  Heparin level (unfractionated)     Status: Abnormal   Collection Time: 02/28/15  2:30 PM  Result Value Ref Range   Heparin Unfractionated 2.01 (H) 0.30 - 0.70 IU/mL  Brain natriuretic peptide     Status: Abnormal   Collection Time: 02/28/15  2:30 PM  Result Value Ref Range   B Natriuretic Peptide 531.1 (H) 0.0 - 100.0 pg/mL   Dg Chest Port 1 View  02/28/2015  CLINICAL DATA:  Left chest/shoulder pain, shortness of breath x2 days EXAM: PORTABLE CHEST 1 VIEW COMPARISON:  02/27/2015 FINDINGS: Mild patchy bilateral lower lobe opacities, suspicious for pneumonia, less likely atelectasis. No definite pleural effusion. No pneumothorax. The heart is normal in size. IMPRESSION: Patchy bilateral lower lobe opacities, suspicious for pneumonia, less likely atelectasis. Electronically Signed   By: Julian Hy M.D.   On: 02/28/2015 12:27     ASSESSMENT:  81 yo with no prior cardiac history  He now appears to be about 21 hours into a large MI  Echo being done shows severe LV dysfunction with akinesis of the antieor, anterolateral,  distal inferior, distal septal and apical walls.   I have discussed with pt and family  He  may be having some pain  But I do not think emergent cardiac catheterzation is with any benefit at this time  Would recomm IV heparin.  Wean levophed  MSO4 and NTG  Follow troponin.  Start ACE I and B blocker and BP and HR tolerate.   Will need to review plans for cath this week to define anatomy  2.  HL  Will chck but start on high dose statin.  3.  Psych  Pt with history of depression.  He is a diffiuclt historian  I am not sure how much he understands  Son has POA>

## 2015-02-28 NOTE — Progress Notes (Signed)
Pt climbing out of bed, trying to hit nursing staff.  Says he wants to sit on porch with his dog.  Tried to reorient pt, but pt kept stating he was going to sit on the porch because that's what he does every night.  Directed pt back to sit on side of bed.  Pt refused to lay down.  Stayed with pt until nurse tech was able to come sit with him.  Called and spoke with pt's son, who is going to come back to stay with pt around 5:30 or 6.  Son states pt takes 100mg  Seroquel every night.  PA Meng notified.  Orders received.  Will continue to monitor.

## 2015-02-28 NOTE — Progress Notes (Signed)
Note past medical history of suicide attempts.  Spoke with pt's son who reports this was 2.5 years ago when pt was taking xanax and MD tried to take him off and he threatened to kill himself if they didn't give him the xanax.  Son reports pt has not had any suicidal ideations since then.  Pt currently denies any desire to kill himself.

## 2015-02-28 NOTE — Progress Notes (Addendum)
Dr. Harrington Challenger called to check on pt.  Orders received to check 12 lead EKG and reduce maintenance fluids to 10 ml/hr. Will continue to monitor.

## 2015-03-01 LAB — HEPARIN LEVEL (UNFRACTIONATED)
Heparin Unfractionated: 1 IU/mL — ABNORMAL HIGH (ref 0.30–0.70)
Heparin Unfractionated: 0.5 IU/mL (ref 0.30–0.70)

## 2015-03-01 LAB — CBC
HCT: 32.1 % — ABNORMAL LOW (ref 39.0–52.0)
Hemoglobin: 10.8 g/dL — ABNORMAL LOW (ref 13.0–17.0)
MCH: 32.4 pg (ref 26.0–34.0)
MCHC: 33.6 g/dL (ref 30.0–36.0)
MCV: 96.4 fL (ref 78.0–100.0)
PLATELETS: 167 10*3/uL (ref 150–400)
RBC: 3.33 MIL/uL — AB (ref 4.22–5.81)
RDW: 13 % (ref 11.5–15.5)
WBC: 9.6 10*3/uL (ref 4.0–10.5)

## 2015-03-01 LAB — BASIC METABOLIC PANEL
ANION GAP: 6 (ref 5–15)
BUN: 10 mg/dL (ref 6–20)
CALCIUM: 7.9 mg/dL — AB (ref 8.9–10.3)
CO2: 24 mmol/L (ref 22–32)
Chloride: 107 mmol/L (ref 101–111)
Creatinine, Ser: 1.19 mg/dL (ref 0.61–1.24)
GFR calc Af Amer: >60 mL/min (ref >60)
GFR, EST NON AFRICAN AMERICAN: 55 mL/min — AB (ref >60)
GLUCOSE: 119 mg/dL — AB (ref 65–99)
Potassium: 3.8 mmol/L (ref 3.5–5.1)
SODIUM: 137 mmol/L (ref 135–145)

## 2015-03-01 LAB — MAGNESIUM: MAGNESIUM: 2.1 mg/dL (ref 1.7–2.4)

## 2015-03-01 LAB — TROPONIN I: Troponin I: 30.02 ng/mL (ref <0.031)

## 2015-03-01 MED ORDER — LORAZEPAM 0.5 MG PO TABS: 0.5000 mg | ORAL_TABLET | Freq: Two times a day (BID) | ORAL | Status: DC | PRN

## 2015-03-01 MED ORDER — HEPARIN (PORCINE) IN NACL 100-0.45 UNIT/ML-% IJ SOLN
1250.0000 [IU]/h | INTRAMUSCULAR | Status: DC
  Administered 2015-03-01 (×2): 1250 [IU]/h via INTRAVENOUS
  Filled 2015-03-01 (×2): qty 250

## 2015-03-01 MED ORDER — LORAZEPAM 2 MG/ML IJ SOLN: 0.5000 mg | Freq: Two times a day (BID) | INTRAMUSCULAR | Status: DC | PRN

## 2015-03-01 NOTE — Progress Notes (Signed)
Utilization Review Completed.Meaghen Vecchiarelli T10/16/2016  

## 2015-03-01 NOTE — Progress Notes (Addendum)
Subjective: No CP  No SOB   Objective: Filed Vitals:   03/01/15 0600 03/01/15 0630 03/01/15 0700 03/01/15 0705  BP: 100/60 96/48  106/62  Pulse: 70 95 77 75  Temp:      TempSrc:      Resp: 16 25 18 14   Height:      Weight:      SpO2: 100% 100% 100% 100%   Weight change:   Intake/Output Summary (Last 24 hours) at 03/01/15 0801 Last data filed at 03/01/15 0700  Gross per 24 hour  Intake 1117.54 ml  Output   1650 ml  Net -532.46 ml    General: Alert, awake, in no acute distress Neck:  JVP is normal Heart: Regular rate and rhythm, without murmurs, rubs, gallops.  Lungs: Clear to auscultation.  No rales or wheezes. Exemities:  No edema.     Tele:  SR   Lab Results: Results for orders placed or performed during the hospital encounter of 02/28/15 (from the past 24 hour(s))  MRSA PCR Screening     Status: None   Collection Time: 02/28/15  9:53 AM  Result Value Ref Range   MRSA by PCR NEGATIVE NEGATIVE  Comprehensive metabolic panel     Status: Abnormal   Collection Time: 02/28/15 11:15 AM  Result Value Ref Range   Sodium 136 135 - 145 mmol/L   Potassium 5.2 (H) 3.5 - 5.1 mmol/L   Chloride 111 101 - 111 mmol/L   CO2 19 (L) 22 - 32 mmol/L   Glucose, Bld 121 (H) 65 - 99 mg/dL   BUN 12 6 - 20 mg/dL   Creatinine, Ser 1.17 0.61 - 1.24 mg/dL   Calcium 7.6 (L) 8.9 - 10.3 mg/dL   Total Protein 5.7 (L) 6.5 - 8.1 g/dL   Albumin 3.3 (L) 3.5 - 5.0 g/dL   AST 226 (H) 15 - 41 U/L   ALT 54 17 - 63 U/L   Alkaline Phosphatase 122 38 - 126 U/L   Total Bilirubin 1.3 (H) 0.3 - 1.2 mg/dL   GFR calc non Af Amer 57 (L) >60 mL/min   GFR calc Af Amer >60 >60 mL/min   Anion gap 6 5 - 15  Troponin I     Status: Abnormal   Collection Time: 02/28/15 11:15 AM  Result Value Ref Range   Troponin I 48.61 (HH) <0.031 ng/mL  CBC WITH DIFFERENTIAL     Status: Abnormal   Collection Time: 02/28/15 11:15 AM  Result Value Ref Range   WBC 14.1 (H) 4.0 - 10.5 K/uL   RBC 3.95 (L) 4.22 - 5.81 MIL/uL    Hemoglobin 12.7 (L) 13.0 - 17.0 g/dL   HCT 38.7 (L) 39.0 - 52.0 %   MCV 98.0 78.0 - 100.0 fL   MCH 32.2 26.0 - 34.0 pg   MCHC 32.8 30.0 - 36.0 g/dL   RDW 13.0 11.5 - 15.5 %   Platelets 220 150 - 400 K/uL   Neutrophils Relative % 75 %   Neutro Abs 10.6 (H) 1.7 - 7.7 K/uL   Lymphocytes Relative 15 %   Lymphs Abs 2.2 0.7 - 4.0 K/uL   Monocytes Relative 9 %   Monocytes Absolute 1.2 (H) 0.1 - 1.0 K/uL   Eosinophils Relative 1 %   Eosinophils Absolute 0.1 0.0 - 0.7 K/uL   Basophils Relative 0 %   Basophils Absolute 0.0 0.0 - 0.1 K/uL  Heparin level (unfractionated)     Status: Abnormal   Collection Time:  02/28/15  2:30 PM  Result Value Ref Range   Heparin Unfractionated 2.01 (H) 0.30 - 0.70 IU/mL  Brain natriuretic peptide     Status: Abnormal   Collection Time: 02/28/15  2:30 PM  Result Value Ref Range   B Natriuretic Peptide 531.1 (H) 0.0 - 100.0 pg/mL  Troponin I     Status: Abnormal   Collection Time: 02/28/15  7:18 PM  Result Value Ref Range   Troponin I 47.14 (HH) <0.031 ng/mL  Heparin level (unfractionated)     Status: Abnormal   Collection Time: 02/28/15 11:05 PM  Result Value Ref Range   Heparin Unfractionated 1.00 (H) 0.30 - 0.70 IU/mL  Basic metabolic panel     Status: Abnormal   Collection Time: 03/01/15  4:31 AM  Result Value Ref Range   Sodium 137 135 - 145 mmol/L   Potassium 3.8 3.5 - 5.1 mmol/L   Chloride 107 101 - 111 mmol/L   CO2 24 22 - 32 mmol/L   Glucose, Bld 119 (H) 65 - 99 mg/dL   BUN 10 6 - 20 mg/dL   Creatinine, Ser 1.19 0.61 - 1.24 mg/dL   Calcium 7.9 (L) 8.9 - 10.3 mg/dL   GFR calc non Af Amer 55 (L) >60 mL/min   GFR calc Af Amer >60 >60 mL/min   Anion gap 6 5 - 15  Magnesium     Status: None   Collection Time: 03/01/15  4:31 AM  Result Value Ref Range   Magnesium 2.1 1.7 - 2.4 mg/dL  CBC     Status: Abnormal   Collection Time: 03/01/15  5:14 AM  Result Value Ref Range   WBC 9.6 4.0 - 10.5 K/uL   RBC 3.33 (L) 4.22 - 5.81 MIL/uL    Hemoglobin 10.8 (L) 13.0 - 17.0 g/dL   HCT 32.1 (L) 39.0 - 52.0 %   MCV 96.4 78.0 - 100.0 fL   MCH 32.4 26.0 - 34.0 pg   MCHC 33.6 30.0 - 36.0 g/dL   RDW 13.0 11.5 - 15.5 %   Platelets 167 150 - 400 K/uL    Studies/Results: Dg Chest Port 1 View  02/28/2015  CLINICAL DATA:  Left chest/shoulder pain, shortness of breath x2 days EXAM: PORTABLE CHEST 1 VIEW COMPARISON:  02/27/2015 FINDINGS: Mild patchy bilateral lower lobe opacities, suspicious for pneumonia, less likely atelectasis. No definite pleural effusion. No pneumothorax. The heart is normal in size. IMPRESSION: Patchy bilateral lower lobe opacities, suspicious for pneumonia, less likely atelectasis. Electronically Signed   By: Julian Hy M.D.   On: 02/28/2015 12:27    Medications:REviewed  @PROBHOSP @  1  STEMI  Pt suffered large anterior/anterolateral MI   Currently pain free.  LVEF severely depressed on echo He is symptom free today.  Volume not bad He is on NTG and Levo  Both at low doses  Will wean both to off today.  If bp allows will add ACE I Continue ASA and Plavix.   On heparin now  Will stop later today Pt is intermittently confused, agitated.  Family called in to see him last night.  Talking to him now he does not remember who I am (After admittting him to hospital yesterday)  Has to be reminded where he is .  Given this, I would recomm conservative care  I do not think risk/benefit ratio favors further ischemic eval at this point.  I will review with family when they get back  Will contact cardiac rehab.  2.  Systolic CHF  Volume OK  Follow  Low NA diet.    Will need to see re pts living situation.     LOS: 1 day   Dorris Carnes 03/01/2015, 8:01 AM

## 2015-03-01 NOTE — Progress Notes (Signed)
ANTICOAGULATION CONSULT NOTE - Follow Up Consult  Pharmacy Consult for Heparin  Indication: chest pain/ACS  No Known Allergies  Patient Measurements: Height: 6\' 2"  (188 cm) Weight: 204 lb 9.4 oz (92.8 kg) IBW/kg (Calculated) : 82.2  Vital Signs: Temp: 98.2 F (36.8 C) (10/16 0800) Temp Source: Oral (10/16 0800) BP: 104/61 mmHg (10/16 1000) Pulse Rate: 87 (10/16 1000)  Labs:  Recent Labs  02/28/15 0014 02/28/15 1115 02/28/15 1430 02/28/15 1918 02/28/15 2305 03/01/15 0431 03/01/15 0514 03/01/15 1000  HGB  --  12.7*  --   --   --   --  10.8*  --   HCT  --  38.7*  --   --   --   --  32.1*  --   PLT  --  220  --   --   --   --  167  --   HEPARINUNFRC  --   --  2.01*  --  1.00*  --   --  0.50  CREATININE  --  1.17  --   --   --  1.19  --   --   TROPONINI 30.02* 48.61*  --  47.14*  --   --   --   --     Estimated Creatinine Clearance: 56.6 mL/min (by C-G formula based on Cr of 1.19).  Assessment: 81 y/o M on tx from [place] s/p MI  on a heparin drip 1250 uts/hr heparin level is 0.5 - therapeutic.  No bleeding, CBC stable  Goal of Therapy:  Heparin level 0.3-0.7 units/ml Monitor platelets by anticoagulation protocol: Yes   Plan:   Continue heparin drip at 1250 units/hr   -Daily CBC/HL -Monitor for bleeding  Bonnita Nasuti Pharm.D. CPP, BCPS Clinical Pharmacist [phone] 03/01/2015 10:56 AM

## 2015-03-01 NOTE — Progress Notes (Signed)
Pt still confused trying to get OOB and SOB at times-family at bedside notified Dr Susy Manor.

## 2015-03-01 NOTE — Progress Notes (Signed)
ANTICOAGULATION CONSULT NOTE - Follow Up Consult  Pharmacy Consult for Heparin  Indication: chest pain/ACS  No Known Allergies  Patient Measurements: Height: 6\' 2"  (188 cm) Weight: 204 lb 9.4 oz (92.8 kg) IBW/kg (Calculated) : 82.2  Vital Signs: Temp: 98 F (36.7 C) (10/16 0000) Temp Source: Oral (10/16 0000) BP: 99/52 mmHg (10/16 0100) Pulse Rate: 84 (10/16 0100)  Labs:  Recent Labs  02/28/15 0014 02/28/15 1115 02/28/15 1430 02/28/15 1918 02/28/15 2305  HGB  --  12.7*  --   --   --   HCT  --  38.7*  --   --   --   PLT  --  220  --   --   --   HEPARINUNFRC  --   --  2.01*  --  1.00*  CREATININE  --  1.17  --   --   --   TROPONINI 30.02* 48.61*  --  47.14*  --     Estimated Creatinine Clearance: 57.6 mL/min (by C-G formula based on Cr of 1.17).  Assessment: 81 y/o M on tx from [place], heparin level is still elevated after rate decrease but is trending down, no issues per RN.   Goal of Therapy:  Heparin level 0.3-0.7 units/ml Monitor platelets by anticoagulation protocol: Yes   Plan:  -Hold heparin x 1 hour -Re-start heparin drip at 1250 units/hr at 0200 -1000 HL -Daily CBC/HL -Monitor for bleeding  Narda Bonds 03/01/2015,1:09 AM

## 2015-03-02 LAB — CBC
HCT: 36.2 % — ABNORMAL LOW (ref 39.0–52.0)
Hemoglobin: 12.2 g/dL — ABNORMAL LOW (ref 13.0–17.0)
MCH: 32.4 pg (ref 26.0–34.0)
MCHC: 33.7 g/dL (ref 30.0–36.0)
MCV: 96 fL (ref 78.0–100.0)
Platelets: 179 10*3/uL (ref 150–400)
RBC: 3.77 MIL/uL — ABNORMAL LOW (ref 4.22–5.81)
RDW: 12.9 % (ref 11.5–15.5)
WBC: 10.2 10*3/uL (ref 4.0–10.5)

## 2015-03-02 LAB — HEPARIN LEVEL (UNFRACTIONATED): HEPARIN UNFRACTIONATED: 0.55 [IU]/mL (ref 0.30–0.70)

## 2015-03-02 MED ORDER — CARVEDILOL 3.125 MG PO TABS
3.1250 mg | ORAL_TABLET | Freq: Two times a day (BID) | ORAL | Status: DC
  Administered 2015-03-02 – 2015-03-04 (×5): 3.125 mg via ORAL
  Filled 2015-03-02 (×5): qty 1

## 2015-03-02 MED FILL — Heparin Sodium (Porcine) 100 Unt/ML in Sodium Chloride 0.45%: INTRAMUSCULAR | Qty: 250 | Status: AC

## 2015-03-02 MED FILL — Fentanyl Citrate Preservative Free (PF) Inj 100 MCG/2ML: INTRAMUSCULAR | Qty: 2 | Status: AC

## 2015-03-02 NOTE — Progress Notes (Signed)
EKG done at 01:17 shows patient is in Atrial fibrillation with rate in 80s. BP decreased to 89/38, Levophed restarted at 58mcg. Dr. Claiborne Billings notified. Will continue to monitor.

## 2015-03-02 NOTE — Progress Notes (Signed)
PROGRESS NOTE  Subjective:   81 yo man with recent large ant. MI. Treated medically given late presentation . LV EF = 20%.     Objective:    Vital Signs:   Temp:  [97.7 F (36.5 C)-99 F (37.2 C)] 98.5 F (36.9 C) (10/17 0800) Pulse Rate:  [53-137] 91 (10/17 0800) Resp:  [13-26] 19 (10/17 0800) BP: (77-133)/(32-78) 120/69 mmHg (10/17 0800) SpO2:  [87 %-100 %] 96 % (10/17 0800)      24-hour weight change: Weight change:   Weight trends: Filed Weights   02/28/15 1000  Weight: 92.8 kg (204 lb 9.4 oz)    Intake/Output:  10/16 0701 - 10/17 0700 In: 1040.8 [P.O.:300; I.V.:740.8] Out: 1750 [Urine:1750] Total I/O In: 41.3 [I.V.:41.3] Out: -    Physical Exam: BP 120/69 mmHg  Pulse 91  Temp(Src) 98.5 F (36.9 C) (Oral)  Resp 19  Ht 6\' 2"  (1.88 m)  Wt 92.8 kg (204 lb 9.4 oz)  BMI 26.26 kg/m2  SpO2 96%  Wt Readings from Last 3 Encounters:  02/28/15 92.8 kg (204 lb 9.4 oz)  06/06/13 79.833 kg (176 lb)  05/02/13 78.472 kg (173 lb)    General: Vital signs reviewed and noted. Alert.  Pleasant man   Head: Normocephalic, atraumatic.  Eyes: conjunctivae/corneas clear.  EOM's intact.   Throat: normal  Neck:  normal   Lungs:    clear   Heart:  RR,   Abdomen:  Soft, non-tender, non-distended    Extremities: No edema    Neurologic: A&O X3, CN II - XII are grossly intact. ,    Psych: Normal     Labs: BMET:  Recent Labs  02/28/15 1115 03/01/15 0431  NA 136 137  K 5.2* 3.8  CL 111 107  CO2 19* 24  GLUCOSE 121* 119*  BUN 12 10  CREATININE 1.17 1.19  CALCIUM 7.6* 7.9*  MG  --  2.1    Liver function tests:  Recent Labs  02/28/15 1115  AST 226*  ALT 54  ALKPHOS 122  BILITOT 1.3*  PROT 5.7*  ALBUMIN 3.3*   No results for input(s): LIPASE, AMYLASE in the last 72 hours.  CBC:  Recent Labs  02/28/15 1115 03/01/15 0514 03/02/15 0536  WBC 14.1* 9.6 10.2  NEUTROABS 10.6*  --   --   HGB 12.7* 10.8* 12.2*  HCT 38.7* 32.1* 36.2*  MCV  98.0 96.4 96.0  PLT 220 167 179    Cardiac Enzymes:  Recent Labs  02/28/15 0014 02/28/15 1115 02/28/15 1918  TROPONINI 30.02* 48.61* 47.14*    Coagulation Studies: No results for input(s): LABPROT, INR in the last 72 hours.  Other: Invalid input(s): POCBNP No results for input(s): DDIMER in the last 72 hours. No results for input(s): HGBA1C in the last 72 hours. No results for input(s): CHOL, HDL, LDLCALC, TRIG, CHOLHDL in the last 72 hours. No results for input(s): TSH, T4TOTAL, T3FREE, THYROIDAB in the last 72 hours.  Invalid input(s): FREET3 No results for input(s): VITAMINB12, FOLATE, FERRITIN, TIBC, IRON, RETICCTPCT in the last 72 hours.   Other results:  EKG  ( personally reviewed )  - NSR, evolving ant. MI   Medications:    Infusions: . sodium chloride 10 mL/hr at 03/01/15 2118  . heparin 1,250 Units/hr (03/02/15 0700)  . nitroGLYCERIN Stopped (03/01/15 0830)  . norepinephrine (LEVOPHED) Adult infusion 4 mcg/min (03/02/15 0800)    Scheduled Medications: . antiseptic oral rinse  7 mL Mouth Rinse BID  .  aspirin EC  81 mg Oral Daily  . atorvastatin  80 mg Oral q1800  . mirtazapine  30 mg Oral QHS  . QUEtiapine  100 mg Oral QHS  . sodium chloride  10-40 mL Intracatheter Q12H  . venlafaxine XR  75 mg Oral QHS    Assessment/ Plan:   Active Problems:   Acute coronary syndrome ([place])  1. CAD :  Ant. MI. Treated medically given late presentation . EF = 20% by echo  HR is a bit high.   BP seems to have stabliized. Will start low dose Coreg.  Transfer to tele. Continue atorvastatin 80    Disposition:  Length of Stay: 2  Ramond Dial., MD, [facility] 03/02/2015, 8:25 AM Office [phone] Pager [phone]

## 2015-03-02 NOTE — Progress Notes (Signed)
Patient is in Atrial flutter with a heart rate in the 70s. Will continue to monitor.

## 2015-03-02 NOTE — Progress Notes (Signed)
CARDIAC REHAB PHASE I   PRE:  Rate/Rhythm: 92 SR    BP: sitting 104/61, standing 91/48    SaO2: 98 RA  MODE:  Ambulation: 150 ft   POST:  Rate/Rhythm: 109 ST with PACs    BP: sitting 108/58     SaO2: 95 RA  Pt conversant and ready to get out of bed and walk. Dizzy initially upon standing, BP slightly lower. Sat down again to get acclimated. Able to stand and walk without dizziness. BP stable after walk. Pt c/o being tired during walk. Used RW and assist x2. Gave pt MI book and began discussing. Concerned that pt lives alone, feel he will need someone with him. Pt is resistant to this. Will discuss with his family. [phone]   Josephina Shih Ravena CES, ACSM 03/02/2015 11:21 AM

## 2015-03-02 NOTE — Progress Notes (Signed)
Patient's rhythm has changed to 2nd degree heart block type 2. EKG done and in chart. Will continue to monitor.

## 2015-03-02 NOTE — Clinical Documentation Improvement (Signed)
Cardiology  Can the diagnosis of systolic CHF be further specified?  - Acuity - Acute, Chronic, Acute on Chronic  -Clinically Undetermined  Document any associated diagnoses/conditions Please update your documentation within the medical record to reflect your response to this query.  Supporting Information: (As per notes) "LVEF severely depressed on echo He is symptom free today"  "Systolic CHF" Labs: B Natriuretic Peptide 0.0 - 100.0 pg/mL 531.1 (H)  ECHO: 02/28/2015 Study Conclusions - Left ventricle: LVEF is approximately 20% with akinesis of the mid/distal septum, distal lateral, mid/distal anteiror, distal inferior and apical walls. The cavity size was normal. Wall thickness was increased in a pattern of mild LVH. - Aortic valve: There was trivial regurgitation. - Right atrium: The atrium was mildly &dilated. - Pulmonary arteries: PA peak pressure: 35 mm Hg (S).  Please exercise your independent, professional judgment when responding. A specific answer is not anticipated or expected.  Thank You, Alessandra Grout, RN, BSN, CCDS,Clinical Documentation Specialist:  [phone]  [phone]=[facility]- Health Information Management

## 2015-03-02 NOTE — Progress Notes (Signed)
ANTICOAGULATION CONSULT NOTE - Follow Up Consult  Pharmacy Consult for Heparin  Indication: chest pain/ACS  No Known Allergies  Patient Measurements: Height: 6\' 2"  (188 cm) Weight: 204 lb 9.4 oz (92.8 kg) IBW/kg (Calculated) : 82.2  Vital Signs: Temp: 98.3 F (36.8 C) (10/17 0500) Temp Source: Oral (10/17 0500) BP: 119/73 mmHg (10/17 0700) Pulse Rate: 96 (10/17 0700)  Labs:  Recent Labs  02/28/15 0014  02/28/15 1115  02/28/15 1918 02/28/15 2305 03/01/15 0431 03/01/15 0514 03/01/15 1000 03/02/15 0536  HGB  --   < > 12.7*  --   --   --   --  10.8*  --  12.2*  HCT  --   --  38.7*  --   --   --   --  32.1*  --  36.2*  PLT  --   --  220  --   --   --   --  167  --  179  HEPARINUNFRC  --   --   --   < >  --  1.00*  --   --  0.50 0.55  CREATININE  --   --  1.17  --   --   --  1.19  --   --   --   TROPONINI 30.02*  --  48.61*  --  47.14*  --   --   --   --   --   < > = values in this interval not displayed.  Estimated Creatinine Clearance: 56.6 mL/min (by C-G formula based on Cr of 1.19).  Assessment: 81 yo male transferred from [facility], remains on heparin gtt, therapeutic on 1250 units/hr. CBC stable, no s/sx bleeding. Noted to be in AFib overnight. Patient coming up on being on heparin gtt for 48 hours.    Goal of Therapy:  Heparin level 0.3-0.7 units/ml Monitor platelets by anticoagulation protocol: Yes    Plan:  -Continue heparin drip at 1250 units/hr  -Daily CBC/HL -Monitor for bleeding -F/u cardiology plan    Hughes Better, PharmD, BCPS Clinical Pharmacist Pager: [phone] 03/02/2015 8:16 AM

## 2015-03-03 DIAGNOSIS — I5021 Acute systolic (congestive) heart failure: Secondary | ICD-10-CM

## 2015-03-03 DIAGNOSIS — I351 Nonrheumatic aortic (valve) insufficiency: Secondary | ICD-10-CM

## 2015-03-03 LAB — LIPID PANEL
CHOLESTEROL: 106 mg/dL (ref 0–200)
HDL: 38 mg/dL — ABNORMAL LOW (ref >40)
LDL Cholesterol: 48 mg/dL (ref 0–99)
TRIGLYCERIDES: 101 mg/dL (ref <150)
Total CHOL/HDL Ratio: 2.8 RATIO
VLDL: 20 mg/dL (ref 0–40)

## 2015-03-03 MED ORDER — ENOXAPARIN SODIUM 40 MG/0.4ML ~~LOC~~ SOLN
40.0000 mg | SUBCUTANEOUS | Status: DC
  Administered 2015-03-03 – 2015-03-04 (×2): 40 mg via SUBCUTANEOUS
  Filled 2015-03-03 (×2): qty 0.4

## 2015-03-03 NOTE — Evaluation (Signed)
Physical Therapy Evaluation Patient Details Name: Edward Trevino MRN: [id number] DOB: Feb 22, 1932 Today's Date: 03/03/2015   History of Present Illness  Patient is a 81 y/o male presents with CP and elevated tropnin and found to have large STEMI. PMH includes depression, anxiety, suicide attempt.  Clinical Impression  Patient presents with mild weakness and DOE s/p STEMI impacting mobility. Balance improved with use of RW. Pt lives alone but has PCA come in daily from 9-2 pm to assist with IADLs. Seems to have supportive family. Pt ambulating and transferring with supervision. Recommend extending PCA hours and HHPT with transition to cardiac rehab so pt can maximize independence and mobility and return to PLOF. Will follow acutely.    Follow Up Recommendations Home health PT;Supervision - Intermittent    Equipment Recommendations  Rolling walker with 5" wheels    Recommendations for Other Services OT consult     Precautions / Restrictions Precautions Precautions: Fall Restrictions Weight Bearing Restrictions: No      Mobility  Bed Mobility               General bed mobility comments: Sitting in recliner upon PT arrival.   Transfers Overall transfer level: Needs assistance Equipment used: None Transfers: Sit to/from Stand Sit to Stand: Supervision         General transfer comment: Supervision for safety.   Ambulation/Gait Ambulation/Gait assistance: Supervision Ambulation Distance (Feet): 350 Feet Assistive device: Rolling walker (2 wheeled) Gait Pattern/deviations: Step-through pattern;Decreased stride length   Gait velocity interpretation: <1.8 ft/sec, indicative of risk for recurrent falls General Gait Details: Steady gait using RW.  A little unsteady without RW for room ambulation. Mild DOE however HR stable.  Stairs            Wheelchair Mobility    Modified Rankin (Stroke Patients Only)       Balance Overall balance assessment: Needs  assistance Sitting-balance support: Feet supported;No upper extremity supported Sitting balance-Leahy Scale: Good     Standing balance support: During functional activity Standing balance-Leahy Scale: Fair                               Pertinent Vitals/Pain Pain Assessment: No/denies pain    Home Living Family/patient expects to be discharged to:: Private residence Living Arrangements: Alone Available Help at Discharge: Personal care attendant ( comes daily 9-2pm.) Type of Home: House Home Access: Level entry     Home Layout: One level Home Equipment: None      Prior Function Level of Independence: Independent               Hand Dominance        Extremity/Trunk Assessment   Upper Extremity Assessment: Defer to OT evaluation           Lower Extremity Assessment: Generalized weakness         Communication   Communication: No difficulties  Cognition Arousal/Alertness: Awake/alert Behavior During Therapy: WFL for tasks assessed/performed Overall Cognitive Status: Within Functional Limits for tasks assessed                      General Comments      Exercises        Assessment/Plan    PT Assessment Patient needs continued PT services  PT Diagnosis Generalized weakness   PT Problem List Decreased strength;Cardiopulmonary status limiting activity;Decreased balance  PT Treatment Interventions Balance training;Gait training;Functional mobility training;Therapeutic activities;Therapeutic exercise;Patient/family  education   PT Goals (Current goals can be found in the Care Plan section) Acute Rehab PT Goals Patient Stated Goal: to go home PT Goal Formulation: With patient Time For Goal Achievement: 03/17/15 Potential to Achieve Goals: Fair    Frequency Min 3X/week   Barriers to discharge Decreased caregiver support Pt lives alone    Co-evaluation               End of Session Equipment Utilized During Treatment:  Gait belt Activity Tolerance: Patient tolerated treatment well Patient left: in chair;with call bell/phone within reach Nurse Communication: Mobility status         Time: 1312-1330 PT Time Calculation (min) (ACUTE ONLY): 18 min   Charges:   PT Evaluation $Initial PT Evaluation Tier I: 1 Procedure     PT G Codes:        Edward Trevino 03/03/2015, 1:39 PM Edward Trevino, [place], DPT [phone]

## 2015-03-03 NOTE — Care Management Important Message (Signed)
Important Message  Patient Details  Name: Edward Trevino MRN: [id number] Date of Birth: June 29, 1931   Medicare Important Message Given:  Yes-second notification given    Delorse Lek 03/03/2015, 3:02 PM

## 2015-03-03 NOTE — Progress Notes (Signed)
CARDIAC REHAB PHASE I   PRE:  Rate/Rhythm: 81 SR  BP:  Sitting: 111/70        SaO2: 97 RA  MODE:  Ambulation: 270 ft   POST:  Rate/Rhythm: 101 ST c/ PACs  BP:  Sitting: 111/61         SaO2: 97 RA  Pt ambulated 270 ft on RA, handheld assist, rolling walker, fairly steady gait, tolerated well.  Pt c/o of mild DOE, denies cp, dizziness, declined rest stop. Completed MI/CHF education with pt son, Octavia Bruckner, at bedside.  Reviewed risk factors, possibility of anti-platelet therapy, activity restrictions, ntg, exercise, heart healthy diet, sodium restrictions, heart failure book and zone tool, daily weights and phase 2 cardiac rehab. Pt verbalized understanding, however needs reinforcement. Pt states he feels depressed about "begin told I can't go home."  Pt states he does not want to go to a rehab facility. Pt son at bedside, states pt would probably function best at home, states they are looking into extending pt's in home care/sitter service hours.  PT to evaluate pt.  Pt agrees to phase 2 cardiac rehab referral, will send to [place].  Pt to recliner after walk, call bell within reach, son at bedside. Will follow-up tomorrow. Pt will benefit from reinforcement of education materials.  [phone]  Lenna Sciara, RN, BSN 03/03/2015 10:53 AM

## 2015-03-03 NOTE — Progress Notes (Signed)
PROGRESS NOTE  Subjective:   81 yo man with recent large ant. MI. Subsequently has developed acute systolic CHF  Treated medically given late presentation . LV EF = 20%.     Objective:    Vital Signs:   Temp:  [98.5 F (36.9 C)-98.7 F (37.1 C)] 98.7 F (37.1 C) (10/17 2300) Pulse Rate:  [75-92] 75 (10/18 0700) Resp:  [16-22] 18 (10/18 0700) BP: (83-120)/(36-78) 96/56 mmHg (10/18 0700) SpO2:  [91 %-99 %] 95 % (10/18 0700)      24-hour weight change: Weight change:   Weight trends: Filed Weights   02/28/15 1000  Weight: 92.8 kg (204 lb 9.4 oz)    Intake/Output:  10/17 0701 - 10/18 0700 In: 708.8 [P.O.:420; I.V.:288.8] Out: 885 [Urine:885]     Physical Exam: BP 96/56 mmHg  Pulse 75  Temp(Src) 98.7 F (37.1 C) (Oral)  Resp 18  Ht 6\' 2"  (1.88 m)  Wt 92.8 kg (204 lb 9.4 oz)  BMI 26.26 kg/m2  SpO2 95%  Wt Readings from Last 3 Encounters:  02/28/15 92.8 kg (204 lb 9.4 oz)  06/06/13 79.833 kg (176 lb)  05/02/13 78.472 kg (173 lb)    General: Vital signs reviewed and noted. Alert.  Pleasant man   Head: Normocephalic, atraumatic.  Eyes: conjunctivae/corneas clear.  EOM's intact.   Throat: normal  Neck:  normal   Lungs:    clear   Heart:  RR,   Abdomen:  Soft, non-tender, non-distended    Extremities: No edema    Neurologic: A&O X3, CN II - XII are grossly intact. ,    Psych: Normal     Labs: BMET:  Recent Labs  02/28/15 1115 03/01/15 0431  NA 136 137  K 5.2* 3.8  CL 111 107  CO2 19* 24  GLUCOSE 121* 119*  BUN 12 10  CREATININE 1.17 1.19  CALCIUM 7.6* 7.9*  MG  --  2.1    Liver function tests:  Recent Labs  02/28/15 1115  AST 226*  ALT 54  ALKPHOS 122  BILITOT 1.3*  PROT 5.7*  ALBUMIN 3.3*   No results for input(s): LIPASE, AMYLASE in the last 72 hours.  CBC:  Recent Labs  02/28/15 1115 03/01/15 0514 03/02/15 0536  WBC 14.1* 9.6 10.2  NEUTROABS 10.6*  --   --   HGB 12.7* 10.8* 12.2*  HCT 38.7* 32.1* 36.2*    MCV 98.0 96.4 96.0  PLT 220 167 179    Cardiac Enzymes:  Recent Labs  02/28/15 1115 02/28/15 1918  TROPONINI 48.61* 47.14*    Coagulation Studies: No results for input(s): LABPROT, INR in the last 72 hours.  Other: Invalid input(s): POCBNP No results for input(s): DDIMER in the last 72 hours. No results for input(s): HGBA1C in the last 72 hours. No results for input(s): CHOL, HDL, LDLCALC, TRIG, CHOLHDL in the last 72 hours. No results for input(s): TSH, T4TOTAL, T3FREE, THYROIDAB in the last 72 hours.  Invalid input(s): FREET3 No results for input(s): VITAMINB12, FOLATE, FERRITIN, TIBC, IRON, RETICCTPCT in the last 72 hours.   Other results:  EKG  ( personally reviewed )  - NSR, evolving ant. MI   Medications:    Infusions: . sodium chloride 10 mL/hr at 03/02/15 2000  . nitroGLYCERIN Stopped (03/01/15 0830)  . norepinephrine (LEVOPHED) Adult infusion Stopped (03/02/15 0946)    Scheduled Medications: . antiseptic oral rinse  7 mL Mouth Rinse BID  . aspirin EC  81 mg Oral Daily  .  atorvastatin  80 mg Oral q1800  . carvedilol  3.125 mg Oral BID WC  . mirtazapine  30 mg Oral QHS  . QUEtiapine  100 mg Oral QHS  . sodium chloride  10-40 mL Intracatheter Q12H  . venlafaxine XR  75 mg Oral QHS    Assessment/ Plan:   Active Problems:   Acute coronary syndrome ([place])  1. CAD :  Ant. MI. Treated medically given late presentation . EF = 20% by echo  HR is a bit high.   BP seems to have stabliized. Started  Coreg  3.125 bid  - HR is much better Transfer to tele. Continue atorvastatin 80.  Lipids looked good in 2011. Will recheck    2. Acute systolic CHF:    EF 72% by echo Continue Coreg. Titrate CHF meds up as tolerated.   3. Aortic insufficiency:  Mild ,   Transfer to tele today ,  Bed was not available yesterday  Will get official PT/OT consult to help with placement decisions     Disposition:  Length of Stay: 3  Thayer Headings, Brooke Bonito., MD,  [facility] 03/03/2015, 8:26 AM Office [phone] Pager [phone]

## 2015-03-04 DIAGNOSIS — I5023 Acute on chronic systolic (congestive) heart failure: Secondary | ICD-10-CM

## 2015-03-04 MED ORDER — CARVEDILOL 6.25 MG PO TABS
6.2500 mg | ORAL_TABLET | Freq: Two times a day (BID) | ORAL | Status: DC
Start: 2015-03-04 — End: 2015-11-24

## 2015-03-04 MED ORDER — CLOPIDOGREL BISULFATE 75 MG PO TABS: 75.0000 mg | ORAL_TABLET | Freq: Every day | ORAL | Status: DC

## 2015-03-04 MED ORDER — CLOPIDOGREL BISULFATE 75 MG PO TABS
150.0000 mg | ORAL_TABLET | Freq: Every day | ORAL | Status: AC
  Administered 2015-03-04: 150 mg via ORAL
  Filled 2015-03-04: qty 2

## 2015-03-04 MED ORDER — ATORVASTATIN CALCIUM 80 MG PO TABS
80.0000 mg | ORAL_TABLET | Freq: Every day | ORAL | Status: DC
Start: 2015-03-04 — End: 2015-09-06

## 2015-03-04 MED ORDER — ASPIRIN 81 MG PO TBEC: 81.0000 mg | DELAYED_RELEASE_TABLET | Freq: Every day | ORAL | Status: DC

## 2015-03-04 MED ORDER — NITROGLYCERIN 0.4 MG SL SUBL: 0.4000 mg | SUBLINGUAL_TABLET | SUBLINGUAL | Status: DC | PRN

## 2015-03-04 MED ORDER — CARVEDILOL 6.25 MG PO TABS
6.2500 mg | ORAL_TABLET | Freq: Two times a day (BID) | ORAL | Status: DC
  Administered 2015-03-04: 6.25 mg via ORAL
  Filled 2015-03-04: qty 1

## 2015-03-04 NOTE — Care Management Note (Signed)
Case Management Note Marvetta Gibbons RN, BSN Unit 2W-Case Manager [phone]  Patient Details  Name: Edward Trevino MRN: [id number] Date of Birth: [birth year]  Subjective/Objective:     Pt admitted with STEMI               Action/Plan: PTA pt lived at home alone- orders for HH-PT/OT and rollator- in to speak with pt- choice offered for HH/DME for [employer]- per pt choice he would like to use [facility] for services- referral called to [place] with [facility] for DME needs- rollator to be delivered to room prior to discharge- call made to Santiago Glad with [facility] for HH-PT/OT referral.   Expected Discharge Date:      03/04/15           Expected Discharge Plan:  [place]  In-House Referral:     Discharge planning Services  CM Consult  Post Acute Care Choice:  Home Health, Durable Medical Equipment Choice offered to:  Patient  DME Arranged:  Walker rolling with seat DME Agency:  [place]:  PT, OT [facility] Agency:  [place]  Status of Service:  Completed, signed off  Medicare Important Message Given:  Yes-second notification given Date Medicare IM Given:    Medicare IM give by:    Date Additional Medicare IM Given:    Additional Medicare Important Message give by:     If discussed at [place] of Stay Meetings, dates discussed:    Additional Comments:  Dawayne Patricia, RN 03/04/2015, 2:39 PM

## 2015-03-04 NOTE — Progress Notes (Signed)
Patient discharge to home with caregiver. PICC dc'd by IV team. Telemetry dc'd. Vitals stable for patient. Discharge instructions and education given to patient and caregiver [name].All questions addressed. 03/04/2015 3:49 PM Edward Trevino

## 2015-03-04 NOTE — Progress Notes (Signed)
Physical Therapy Treatment Patient Details Name: Edward Trevino MRN: [id number] DOB: 25-Aug-1931 Today's Date: 03/04/2015    History of Present Illness Patient is a 81 y.o. male presents with CP and elevated tropnin and found to have large STEMI. PMH includes depression, anxiety, suicide attempt.    PT Comments    Patient progressing slowly towards PT goals. Very irritated about working with OT this morning - "she put that belt on me and treated me like I was a horse, or convict and made me use the RW." Pt declined using RW this session. Drifting and unsteadiness noted during ambulation today without use of RW. Using rail for assist. Continues to have some DOE. May try [facility] next session for balance. Will follow acutely.   Follow Up Recommendations  Home health PT;Supervision - Intermittent     Equipment Recommendations  Rolling walker with 5" wheels    Recommendations for Other Services       Precautions / Restrictions Precautions Precautions: Fall Restrictions Weight Bearing Restrictions: No    Mobility  Bed Mobility               General bed mobility comments: Sitting in chair upon PT arrival.   Transfers Overall transfer level: Needs assistance Equipment used: None Transfers: Sit to/from Stand Sit to Stand: Supervision         General transfer comment: Supervision for safety. A little impulsive when returning to seated position - sitting before reaching for chair.  Ambulation/Gait Ambulation/Gait assistance: Supervision Ambulation Distance (Feet): 400 Feet Assistive device: None Gait Pattern/deviations: Step-through pattern;Decreased stride length;Drifts right/left;Staggering left   Gait velocity interpretation: <1.8 ft/sec, indicative of risk for recurrent falls General Gait Details: Pt more unsteady on feet without using RW. Using rail in hallway for support. Short standing rest breaks due to DOE. HR stable.   Stairs            Wheelchair  Mobility    Modified Rankin (Stroke Patients Only)       Balance Overall balance assessment: Needs assistance Sitting-balance support: Feet supported;No upper extremity supported Sitting balance-Leahy Scale: Good     Standing balance support: During functional activity Standing balance-Leahy Scale: Fair                      Cognition Arousal/Alertness: Awake/alert Behavior During Therapy: WFL for tasks assessed/performed Overall Cognitive Status: No family/caregiver present to determine baseline cognitive functioning (decreased safety awareness.)                      Exercises      General Comments        Pertinent Vitals/Pain Pain Assessment: No/denies pain Faces Pain Scale: No hurt    Home Living Family/patient expects to be discharged to:: Private residence Living Arrangements: Alone Available Help at Discharge:  (comes daily 9-2 or 3?) Type of Home: House Home Access: Level entry   Home Layout: One level Home Equipment: None      Prior Function Level of Independence: Independent      Comments: pt reports he sponge bathes   PT Goals (current goals can now be found in the care plan section) Acute Rehab PT Goals Patient Stated Goal: not stated Progress towards PT goals: Progressing toward goals    Frequency       PT Plan Current plan remains appropriate    Co-evaluation             End of Session  Activity Tolerance: Patient tolerated treatment well Patient left: in chair;with call bell/phone within reach     Time: 1123-1136 PT Time Calculation (min) (ACUTE ONLY): 13 min  Charges:  $Gait Training: 8-22 mins                    G Codes:      Tamisha Nordstrom A Marylyn Appenzeller 03/04/2015, 11:48 AM Wray Kearns, PT, DPT [phone]

## 2015-03-04 NOTE — Progress Notes (Signed)
Patient Profile: 81 y/o male with recent large ant. MI.  Treated medically given late presentation. Subsequently has developed acute systolic CHF. EF 20%.    Subjective: No complaints. Denies CP or dyspnea.  Has ambulated well with cardiac rehab PT and OT have seen    Objective: Vital signs in last 24 hours: Temp:  [97.9 F (36.6 C)-99.3 F (37.4 C)] 97.9 F (36.6 C) (10/19 0507) Pulse Rate:  [77-88] 86 (10/19 0822) Resp:  [18] 18 (10/19 0507) BP: (102-118)/(62-73) 118/73 mmHg (10/19 0822) SpO2:  [96 %-100 %] 96 % (10/19 0507) Last BM Date: 03/02/15  Intake/Output from previous day: 10/18 0701 - 10/19 0700 In: 500 [P.O.:480; I.V.:20] Out: 250 [Urine:250] Intake/Output this shift: Total I/O In: 20 [I.V.:20] Out: -   Medications Current Facility-Administered Medications  Medication Dose Route Frequency Provider Last Rate Last Dose  . 0.9 %  sodium chloride infusion   Intravenous Continuous Fay Records, MD   Stopped at 03/03/15 0800  . acetaminophen (TYLENOL) tablet 650 mg  650 mg Oral Q4H PRN Almyra Deforest, PA      . antiseptic oral rinse (CPC / CETYLPYRIDINIUM CHLORIDE 0.05%) solution 7 mL  7 mL Mouth Rinse BID Fay Records, MD   7 mL at 03/03/15 2127  . aspirin EC tablet 81 mg  81 mg Oral Daily Fay Records, MD   81 mg at 03/04/15 1026  . atorvastatin (LIPITOR) tablet 80 mg  80 mg Oral q1800 Almyra Deforest, PA   80 mg at 03/03/15 1716  . carvedilol (COREG) tablet 3.125 mg  3.125 mg Oral BID WC Thayer Headings, MD   3.125 mg at 03/04/15 3557  . enoxaparin (LOVENOX) injection 40 mg  40 mg Subcutaneous Q24H Jake Church Masters, RPH   40 mg at 03/04/15 1026  . HYDROcodone-acetaminophen (NORCO/VICODIN) 5-325 MG per tablet 1-2 tablet  1-2 tablet Oral Q6H PRN Almyra Deforest, PA   2 tablet at 02/28/15 1207  . LORazepam (ATIVAN) injection 0.5 mg  0.5 mg Intravenous Q12H PRN Wandra Mannan, MD      . mirtazapine (REMERON) tablet 30 mg  30 mg Oral QHS Fay Records, MD   30 mg at 03/03/15 2126  .  morphine 4 MG/ML injection 4 mg  4 mg Intravenous Q3H PRN Almyra Deforest, PA   4 mg at 02/28/15 1053  . nitroGLYCERIN (NITROSTAT) SL tablet 0.4 mg  0.4 mg Sublingual Q5 Min x 3 PRN Almyra Deforest, PA      . ondansetron (ZOFRAN) injection 4 mg  4 mg Intravenous Q6H PRN Almyra Deforest, PA      . QUEtiapine (SEROQUEL) tablet 100 mg  100 mg Oral QHS Almyra Deforest, PA   100 mg at 03/03/15 2125  . sodium chloride 0.9 % injection 10-40 mL  10-40 mL Intracatheter Q12H Fay Records, MD   20 mL at 03/04/15 0833  . sodium chloride 0.9 % injection 10-40 mL  10-40 mL Intracatheter PRN Fay Records, MD      . venlafaxine XR (EFFEXOR-XR) 24 hr capsule 75 mg  75 mg Oral QHS Fay Records, MD   75 mg at 03/03/15 2126    PE: General appearance: alert, cooperative and no distress Neck: no carotid bruit and no JVD Lungs: clear to auscultation bilaterally Heart: regular rate and rhythm, S1, S2 normal, no murmur, click, rub or gallop Extremities: no LEE Pulses: 2+ and symmetric Skin: warm and dry Neurologic: Grossly normal  Lab Results:  Recent Labs  03/02/15 0536  WBC 10.2  HGB 12.2*  HCT 36.2*  PLT 179   BMET No results for input(s): NA, K, CL, CO2, GLUCOSE, BUN, CREATININE, CALCIUM in the last 72 hours. PT/INR No results for input(s): LABPROT, INR in the last 72 hours. Cholesterol  Recent Labs  03/03/15 0905  CHOL 106   Cardiac Panel (last 3 results) No results for input(s): CKTOTAL, CKMB, TROPONINI, RELINDX in the last 72 hours.  Studies/Results: 2D Echo 02/28/15  Study Conclusions  - Left ventricle: LVEF is approximately 20% with akinesis of the mid/distal septum, distal lateral, mid/distal anteiror, distal inferior and apical walls. The cavity size was normal. Wall thickness was increased in a pattern of mild LVH. - Aortic valve: There was trivial regurgitation. - Right atrium: The atrium was mildly dilated. - Pulmonary arteries: PA peak pressure: 35 mm Hg (S).   Assessment/Plan  Active  Problems:   Acute coronary syndrome ([place])  1. Acute Coronary Syndrome:  Large anterior MI. Medical therapy elected given late presentation. EF 20% by echo. No LV thrombus. HR and  BP both well controlled. No recurrent CP. Continue ASA, statin, and BB.  2. Acute Systolic CHF: EF 25%. No dyspnea. Volume is stable on exam. NSR on telemetry w/o any ventricular arhythmias. Continue BB therapy with Coreg.  Dose increased to 6.125 BID    3. Aortic Insufficiency: mild    Ok to DC to home today   Follow up withDr. Harrington Challenger.     LOS: 4 days    Brittainy M. Ladoris Gene 03/04/2015 10:46 AM  Attending Note:   The patient was seen and examined.  Agree with assessment and plan as noted above.  Changes made to the above note as needed.  Pt has done well. Has been seen by OT and PT   The recommend Home health OT / PT Rolling walker with 5" wheels     Thayer Headings, Brooke Bonito., MD, [facility] 03/04/2015, 11:09 AM [address],  [place] Pager [phone]

## 2015-03-04 NOTE — Discharge Summary (Signed)
Physician Discharge Summary  Patient ID: Edward Trevino MRN: [id number] DOB/AGE: 79-19-1933 79 y.o.   Primary Cardiologist: Dr. Harrington Challenger.  Admit date: 02/28/2015 Discharge date: 03/04/2015  Admission Diagnoses: Anterior MI  Discharge Diagnoses:  Active Problems:   Acute coronary syndrome [facility])   Discharged Condition: stable  Hospital Course: the patient is a 81 y/o male with no previous cardiac history who presented to [facility] on 02/28/15 with a large anterior MI. Given his late presentation, medical therapy vs PCI was elected. IV heparin was initiated. His CP resolved. He was placed on DAPT with ASA + Plavix and high dose statin therapy. A 2D echo was obtained which showed severely depressed LVF with an EF of 20%. Coreg was started for LV dysfunction. His BP was too soft to initiated ACE therapy. His volume remained stable w/o acute exacerbation. He denied any further CP. He was assessed by PT who recommended home health PT/OT and a rolling walker, all of which were arranged prior to discharge. He was last seen and examined by Dr. Acie Fredrickson who determined he was stable for discharge home. He will have 1 week post hospital follow-up in [place] with Jory Sims, NP, on 03/11/25. He will f/u with Dr. Harrington Challenger on 04/13/15.   Consults: None  Significant Diagnostic Studies:  2D Echo  Study Conclusions  - Left ventricle: LVEF is approximately 20% with akinesis of the mid/distal septum, distal lateral, mid/distal anteiror, distal inferior and apical walls. The cavity size was normal. Wall thickness was increased in a pattern of mild LVH. - Aortic valve: There was trivial regurgitation. - Right atrium: The atrium was mildly dilated. - Pulmonary arteries: PA peak pressure: 35 mm Hg (S).  Treatments: See Hospital Course  Discharge Exam: Blood pressure 118/73, pulse 86, temperature 97.9 F (36.6 C), temperature source Oral, resp. rate 18, height 6\' 2"  (1.88 m), weight 204 lb 9.4 oz (92.8  kg), SpO2 96 %.   Disposition: 01-Home or Self Care      Discharge Instructions    Amb Referral to Cardiac Rehabilitation    Complete by:  As directed   Diagnosis:  Myocardial Infarction     Diet - low sodium heart healthy    Complete by:  As directed      Face-to-face encounter (required for Medicare/Medicaid patients)    Complete by:  As directed   I SIMMONS, BRITTAINY certify that this patient is under my care and that I, or a nurse practitioner or physician's assistant working with me, had a face-to-face encounter that meets the physician face-to-face encounter requirements with this patient on 03/04/2015. The encounter with the patient was in whole, or in part for the following medical condition(s) which is the primary reason for home health care (List medical condition): Myocardial infarction. Change in functional status. Unstable ambulation.  The encounter with the patient was in whole, or in part, for the following medical condition, which is the primary reason for home health care:  decline in functional status  I certify that, based on my findings, the following services are medically necessary home health services:  Physical therapy  Reason for Medically Necessary Home Health Services:  Therapy- Home Adaptation to Facilitate Safety  My clinical findings support the need for the above services:  Unable to leave home safely without assistance and/or assistive device  Further, I certify that my clinical findings support that this patient is homebound due to:  Unable to leave home safely without assistance     For home use only  DME 4 wheeled rolling walker with seat    Complete by:  As directed      Home Health    Complete by:  As directed   To provide the following care/treatments:   PT OT       Increase activity slowly    Complete by:  As directed             Medication List    TAKE these medications        aspirin 81 MG EC tablet  Take 1 tablet (81 mg total) by mouth  daily.     atorvastatin 80 MG tablet  Commonly known as:  LIPITOR  Take 1 tablet (80 mg total) by mouth daily at 6 PM.     carvedilol 6.25 MG tablet  Commonly known as:  COREG  Take 1 tablet (6.25 mg total) by mouth 2 (two) times daily with a meal.     clopidogrel 75 MG tablet  Commonly known as:  PLAVIX  Take 1 tablet (75 mg total) by mouth daily.  Start taking on:  03/05/2015     mirtazapine 30 MG tablet  Commonly known as:  REMERON  Take 30 mg by mouth at bedtime.     nitroGLYCERIN 0.4 MG SL tablet  Commonly known as:  NITROSTAT  Place 1 tablet (0.4 mg total) under the tongue every 5 (five) minutes x 3 doses as needed for chest pain.     QUEtiapine 100 MG tablet  Commonly known as:  SEROQUEL  Take 100 mg by mouth at bedtime.     Venlafaxine HCl 75 MG Tb24  Take 75 mg by mouth at bedtime.       Follow-up Information    Follow up with Jory Sims, NP On 03/12/2015.   Specialties:  Nurse Practitioner, Radiology, Cardiology   Why:  1:30 PM cardiology follow-up visit   Contact information:   [address] [place] [address] [phone]       Follow up with Dorris Carnes, MD On 04/13/2015.   Specialty:  Cardiology   Why:  2:00 PM   Contact information:   [place] [address] [place] [address] [phone]      TIME SPENT ON DISCHARGE INCLUDING PHYSICIAN TIME: > 30 MINUTES  Signed: Lyda Jester 03/04/2015, 2:33 PM  Attending Note:   The patient was seen and examined.  Agree with assessment and plan as noted above.  Changes made to the above note as needed.  See my note from day of discharge Ready for DC   Thayer Headings, Brooke Bonito., MD, [facility] 03/05/2015, 6:00 PM [address],  [place] Pager [phone]

## 2015-03-04 NOTE — Evaluation (Signed)
Occupational Therapy Evaluation Patient Details Name: Edward Trevino MRN: [id number] DOB: Sep 29, 1931 Today's Date: 03/04/2015    History of Present Illness Patient is a 81 y.o. male presents with CP and elevated tropnin and found to have large STEMI. PMH includes depression, anxiety, suicide attempt.   Clinical Impression   Pt admitted with above. Pt independent with ADLs, PTA. Feel pt will benefit from acute OT to increase strength and independence prior to d/c. Recommending HHOT.    Follow Up Recommendations  Home health OT    Equipment Recommendations  3 in 1 bedside comode    Recommendations for Other Services Other (comment) (social work consult)     Precautions / Restrictions Precautions Precautions: Fall Restrictions Weight Bearing Restrictions: No      Mobility Bed Mobility               General bed mobility comments: not assessed  Transfers Overall transfer level: Needs assistance   Transfers: Sit to/from Stand Sit to Stand: Min guard         General transfer comment: Min guard-Supervision    Balance      Pt unsteady at times on feet.                                       ADL Overall ADL's : Needs assistance/impaired     Grooming: Wash/dry face;Wash/dry hands;Standing;Min guard (Min guard for ambulation as he went to retrieve item) Grooming Details (indicate cue type and reason): pt retrieved washcloth             Lower Body Dressing: Min guard;Sit to/from stand   Toilet Transfer: Min guard;Ambulation;Regular Toilet   Toileting- Water quality scientist and Hygiene: Supervision/safety (standing-stood and urinated)       Functional mobility during ADLs: Min guard;Rolling walker (ambulated with and without RW; took some steps to toilet with supervision-pt irritated with OT being close to him)       Vision     Perception     Praxis      Pertinent Vitals/Pain Pain Assessment: Faces Faces Pain Scale: No hurt      Hand Dominance     Extremity/Trunk Assessment Upper Extremity Assessment Upper Extremity Assessment: Generalized weakness   Lower Extremity Assessment Lower Extremity Assessment: Defer to PT evaluation       Communication Communication Communication: No difficulties   Cognition Arousal/Alertness: Awake/alert Behavior During Therapy: WFL for tasks assessed/performed Overall Cognitive Status: No family/caregiver present to determine baseline cognitive functioning (decreased safety awareness)                     General Comments       Exercises       Shoulder Instructions      Home Living Family/patient expects to be discharged to:: Private residence Living Arrangements: Alone Available Help at Discharge:  (comes daily 9-2 or 3?) Type of Home: House Home Access: Level entry     Home Layout: One level         Bathroom Toilet: Standard     Home Equipment: None          Prior Functioning/Environment Level of Independence: Independent        Comments: pt reports he sponge bathes    OT Diagnosis: Generalized weakness   OT Problem List: Decreased strength;Impaired balance (sitting and/or standing);Decreased safety awareness;Decreased knowledge of use of DME or AE;Decreased knowledge  of precautions   OT Treatment/Interventions: Self-care/ADL training;Therapeutic exercise;DME and/or AE instruction;Therapeutic activities;Patient/family education;Balance training;Cognitive remediation/compensation    OT Goals(Current goals can be found in the care plan section) Acute Rehab OT Goals Patient Stated Goal: not stated OT Goal Formulation: With patient Time For Goal Achievement: 03/11/15 Potential to Achieve Goals: Good ADL Goals Pt Will Perform Lower Body Bathing: with modified independence;sit to/from stand (including gathering items) Pt Will Perform Lower Body Dressing: with modified independence;sit to/from stand (including gathering items) Pt  Will Transfer to Toilet: with modified independence;ambulating  OT Frequency: Min 2X/week   Barriers to D/C:            Co-evaluation              End of Session Equipment Utilized During Treatment: Gait belt;Rolling walker Nurse Communication: Other (comment) (recommending HHOT; refused tech to put chair alarm )  Activity Tolerance: Patient tolerated treatment well Patient left: in chair;with call bell/phone within reach   Time: 0914-0930 OT Time Calculation (min): 16 min Charges:  OT General Charges $OT Visit: 1 Procedure OT Evaluation $Initial OT Evaluation Tier I: 1 Procedure G-CodesBenito Trevino OTR/L [id number] 03/04/2015, 11:00 AM

## 2015-03-05 DIAGNOSIS — I249 Acute ischemic heart disease, unspecified: Secondary | ICD-10-CM | POA: Diagnosis not present

## 2015-03-05 DIAGNOSIS — F419 Anxiety disorder, unspecified: Secondary | ICD-10-CM | POA: Diagnosis not present

## 2015-03-05 DIAGNOSIS — I2109 ST elevation (STEMI) myocardial infarction involving other coronary artery of anterior wall: Secondary | ICD-10-CM | POA: Diagnosis not present

## 2015-03-05 DIAGNOSIS — Z720 Tobacco use: Secondary | ICD-10-CM | POA: Diagnosis not present

## 2015-03-05 DIAGNOSIS — Z915 Personal history of self-harm: Secondary | ICD-10-CM | POA: Diagnosis not present

## 2015-03-05 DIAGNOSIS — F329 Major depressive disorder, single episode, unspecified: Secondary | ICD-10-CM | POA: Diagnosis not present

## 2015-03-06 DIAGNOSIS — F419 Anxiety disorder, unspecified: Secondary | ICD-10-CM | POA: Diagnosis not present

## 2015-03-06 DIAGNOSIS — Z915 Personal history of self-harm: Secondary | ICD-10-CM | POA: Diagnosis not present

## 2015-03-06 DIAGNOSIS — F329 Major depressive disorder, single episode, unspecified: Secondary | ICD-10-CM | POA: Diagnosis not present

## 2015-03-06 DIAGNOSIS — I249 Acute ischemic heart disease, unspecified: Secondary | ICD-10-CM | POA: Diagnosis not present

## 2015-03-06 DIAGNOSIS — Z720 Tobacco use: Secondary | ICD-10-CM | POA: Diagnosis not present

## 2015-03-06 DIAGNOSIS — I2109 ST elevation (STEMI) myocardial infarction involving other coronary artery of anterior wall: Secondary | ICD-10-CM | POA: Diagnosis not present

## 2015-03-09 DIAGNOSIS — Z720 Tobacco use: Secondary | ICD-10-CM | POA: Diagnosis not present

## 2015-03-09 DIAGNOSIS — F419 Anxiety disorder, unspecified: Secondary | ICD-10-CM | POA: Diagnosis not present

## 2015-03-09 DIAGNOSIS — F329 Major depressive disorder, single episode, unspecified: Secondary | ICD-10-CM | POA: Diagnosis not present

## 2015-03-09 DIAGNOSIS — Z915 Personal history of self-harm: Secondary | ICD-10-CM | POA: Diagnosis not present

## 2015-03-09 DIAGNOSIS — I2109 ST elevation (STEMI) myocardial infarction involving other coronary artery of anterior wall: Secondary | ICD-10-CM | POA: Diagnosis not present

## 2015-03-09 DIAGNOSIS — I249 Acute ischemic heart disease, unspecified: Secondary | ICD-10-CM | POA: Diagnosis not present

## 2015-03-10 DIAGNOSIS — F419 Anxiety disorder, unspecified: Secondary | ICD-10-CM | POA: Diagnosis not present

## 2015-03-10 DIAGNOSIS — Z720 Tobacco use: Secondary | ICD-10-CM | POA: Diagnosis not present

## 2015-03-10 DIAGNOSIS — Z915 Personal history of self-harm: Secondary | ICD-10-CM | POA: Diagnosis not present

## 2015-03-10 DIAGNOSIS — I2109 ST elevation (STEMI) myocardial infarction involving other coronary artery of anterior wall: Secondary | ICD-10-CM | POA: Diagnosis not present

## 2015-03-10 DIAGNOSIS — I249 Acute ischemic heart disease, unspecified: Secondary | ICD-10-CM | POA: Diagnosis not present

## 2015-03-10 DIAGNOSIS — F329 Major depressive disorder, single episode, unspecified: Secondary | ICD-10-CM | POA: Diagnosis not present

## 2015-03-11 DIAGNOSIS — I2109 ST elevation (STEMI) myocardial infarction involving other coronary artery of anterior wall: Secondary | ICD-10-CM | POA: Diagnosis not present

## 2015-03-11 DIAGNOSIS — F419 Anxiety disorder, unspecified: Secondary | ICD-10-CM | POA: Diagnosis not present

## 2015-03-11 DIAGNOSIS — Z720 Tobacco use: Secondary | ICD-10-CM | POA: Diagnosis not present

## 2015-03-11 DIAGNOSIS — F329 Major depressive disorder, single episode, unspecified: Secondary | ICD-10-CM | POA: Diagnosis not present

## 2015-03-11 DIAGNOSIS — I249 Acute ischemic heart disease, unspecified: Secondary | ICD-10-CM | POA: Diagnosis not present

## 2015-03-11 DIAGNOSIS — Z915 Personal history of self-harm: Secondary | ICD-10-CM | POA: Diagnosis not present

## 2015-03-12 ENCOUNTER — Ambulatory Visit (INDEPENDENT_AMBULATORY_CARE_PROVIDER_SITE_OTHER): Payer: Medicare Other | Admitting: Adult Health

## 2015-03-12 ENCOUNTER — Encounter: Payer: Self-pay | Admitting: Adult Health

## 2015-03-12 VITALS — BP 108/64 | HR 90 | Ht 74.0 in | Wt 195.8 lb

## 2015-03-12 DIAGNOSIS — I255 Ischemic cardiomyopathy: Secondary | ICD-10-CM | POA: Diagnosis not present

## 2015-03-12 DIAGNOSIS — I2510 Atherosclerotic heart disease of native coronary artery without angina pectoris: Secondary | ICD-10-CM | POA: Diagnosis not present

## 2015-03-12 MED ORDER — CARVEDILOL 6.25 MG PO TABS: 9.3750 mg | ORAL_TABLET | Freq: Two times a day (BID) | ORAL | Status: DC

## 2015-03-12 NOTE — Patient Instructions (Addendum)
Your physician recommends that you schedule a follow-up appointment in: 1 month with Jory Sims, NP  If you need a refill on your cardiac medications before your next appointment, please call your pharmacy.  INCREASE YOUR COREG TO 9.375 (1 1/2 PILLS PER DAY)  Thanks for choosing [facility]!!!

## 2015-03-12 NOTE — Progress Notes (Signed)
Name: Edward Trevino    DOB: 1932/01/21  Age: 81 y.o.  MR#: [id number]       PCP:  Deloria Lair, MD      Insurance: Payor: MEDICARE / Plan: MEDICARE PART A AND B / Product Type: *No Product type* /   CC:    Chief Complaint  Patient presents with  . Coronary Artery Disease  . Cardiomyopathy    VS Filed Vitals:   03/12/15 1340  BP: 108/64  Pulse: 90  Height: 6\' 2"  (1.88 m)  Weight: 195 lb 12.8 oz (88.814 kg)  SpO2: 98%    Weights Current Weight  03/12/15 195 lb 12.8 oz (88.814 kg)  02/28/15 204 lb 9.4 oz (92.8 kg)  06/06/13 176 lb (79.833 kg)    Blood Pressure  BP Readings from Last 3 Encounters:  03/12/15 108/64  03/04/15 115/69  06/06/13 110/80     Admit date:  (Not on file) Last encounter with RMR:  Visit date not found   Allergy Review of patient's allergies indicates no known allergies.  Current Outpatient Prescriptions  Medication Sig Dispense Refill  . aspirin EC 81 MG EC tablet Take 1 tablet (81 mg total) by mouth daily.    Marland Kitchen atorvastatin (LIPITOR) 80 MG tablet Take 1 tablet (80 mg total) by mouth daily at 6 PM. 30 tablet 5  . carvedilol (COREG) 6.25 MG tablet Take 1 tablet (6.25 mg total) by mouth 2 (two) times daily with a meal. 60 tablet 5  . clopidogrel (PLAVIX) 75 MG tablet Take 1 tablet (75 mg total) by mouth daily. 30 tablet 5  . mirtazapine (REMERON) 30 MG tablet Take 30 mg by mouth at bedtime.    . nitroGLYCERIN (NITROSTAT) 0.4 MG SL tablet Place 1 tablet (0.4 mg total) under the tongue every 5 (five) minutes x 3 doses as needed for chest pain. 25 tablet 2  . QUEtiapine (SEROQUEL) 100 MG tablet Take 100 mg by mouth at bedtime.    . Venlafaxine HCl 75 MG TB24 Take 75 mg by mouth at bedtime.     No current facility-administered medications for this visit.    Discontinued Meds:   There are no discontinued medications.  Patient Active Problem List   Diagnosis Date Noted  . Acute coronary syndrome ([place]) 02/28/2015  . Urinary urgency 12/22/2012  .  MDD (major depressive disorder), recurrent severe, without psychosis ([place]) 12/19/2012  . Insomnia secondary to depression with anxiety 12/19/2012  . Anxiety   . Abnormal LFTs 10/01/2012  . Gallstones 10/01/2012  . Anemia 10/01/2012  . Erosive esophagitis 07/25/2012  . Gastric out let obstruction 07/25/2012  . Choledocholithiasis with obstruction 07/25/2012    Class: History of  . Transaminitis 07/25/2012  . Obstructive jaundice 07/23/2012    LABS    Component Value Date/Time   NA 137 03/01/2015 0431   NA 136 02/28/2015 1115   NA 138 04/26/2013 1633   NA 137 10/05/2012   K 3.8 03/01/2015 0431   K 5.2* 02/28/2015 1115   K 4.0 04/26/2013 1633   K 3.7 10/05/2012   CL 107 03/01/2015 0431   CL 111 02/28/2015 1115   CL 104 04/26/2013 1633   CO2 24 03/01/2015 0431   CO2 19* 02/28/2015 1115   CO2 25 04/26/2013 1633   GLUCOSE 119* 03/01/2015 0431   GLUCOSE 121* 02/28/2015 1115   GLUCOSE 85 04/26/2013 1633   BUN 10 03/01/2015 0431   BUN 12 02/28/2015 1115   BUN 13 04/26/2013 1633  BUN 10 10/05/2012   CREATININE 1.19 03/01/2015 0431   CREATININE 1.17 02/28/2015 1115   CREATININE 1.17 04/26/2013 1633   CREATININE 1.09 10/05/2012   CALCIUM 7.9* 03/01/2015 0431   CALCIUM 7.6* 02/28/2015 1115   CALCIUM 9.1 04/26/2013 1633   CALCIUM 8.7 10/05/2012   GFRNONAA 55* 03/01/2015 0431   GFRNONAA 57* 02/28/2015 1115   GFRNONAA 57* 04/26/2013 1633   GFRAA >60 03/01/2015 0431   GFRAA >60 02/28/2015 1115   GFRAA 67* 04/26/2013 1633   CMP     Component Value Date/Time   NA 137 03/01/2015 0431   NA 137 10/05/2012   K 3.8 03/01/2015 0431   K 3.7 10/05/2012   CL 107 03/01/2015 0431   CO2 24 03/01/2015 0431   GLUCOSE 119* 03/01/2015 0431   BUN 10 03/01/2015 0431   BUN 10 10/05/2012   CREATININE 1.19 03/01/2015 0431   CREATININE 1.09 10/05/2012   CALCIUM 7.9* 03/01/2015 0431   CALCIUM 8.7 10/05/2012   PROT 5.7* 02/28/2015 1115   ALBUMIN 3.3* 02/28/2015 1115   ALBUMIN 4.0  10/05/2012   AST 226* 02/28/2015 1115   AST 23 10/05/2012   ALT 54 02/28/2015 1115   ALKPHOS 122 02/28/2015 1115   ALKPHOS 97 10/05/2012   BILITOT 1.3* 02/28/2015 1115   BILITOT 0.3 10/05/2012   GFRNONAA 55* 03/01/2015 0431   GFRAA >60 03/01/2015 0431       Component Value Date/Time   WBC 10.2 03/02/2015 0536   WBC 9.6 03/01/2015 0514   WBC 14.1* 02/28/2015 1115   HGB 12.2* 03/02/2015 0536   HGB 10.8* 03/01/2015 0514   HGB 12.7* 02/28/2015 1115   HGB 13.5 10/05/2012   HCT 36.2* 03/02/2015 0536   HCT 32.1* 03/01/2015 0514   HCT 38.7* 02/28/2015 1115   HCT 40 10/05/2012   MCV 96.0 03/02/2015 0536   MCV 96.4 03/01/2015 0514   MCV 98.0 02/28/2015 1115   MCV 35.3 10/05/2012    Lipid Panel     Component Value Date/Time   CHOL 106 03/03/2015 0905   TRIG 101 03/03/2015 0905   HDL 38* 03/03/2015 0905   CHOLHDL 2.8 03/03/2015 0905   VLDL 20 03/03/2015 0905   LDLCALC 48 03/03/2015 0905    ABG    Component Value Date/Time   TCO2 28 10/23/2009 0245     Lab Results  Component Value Date   TSH 2.009 12/23/2012   BNP (last 3 results)  Recent Labs  02/28/15 1430  BNP 531.1*    ProBNP (last 3 results) No results for input(s): PROBNP in the last 8760 hours.  Cardiac Panel (last 3 results) No results for input(s): CKTOTAL, CKMB, TROPONINI, RELINDX in the last 72 hours.  Iron/TIBC/Ferritin/ %Sat No results found for: IRON, TIBC, FERRITIN, IRONPCTSAT   EKG Orders placed or performed during the hospital encounter of 02/28/15  . EKG 12-Lead  . EKG 12-Lead  . EKG 12-Lead  . EKG 12-Lead  . EKG 12-Lead  . EKG 12-Lead  . EKG 12-Lead  . EKG 12-Lead  . EKG 12-Lead     Prior Assessment and Plan Problem List as of 03/12/2015      Cardiovascular and Mediastinum   Acute coronary syndrome [facility])     Digestive   Obstructive jaundice   Erosive esophagitis   Gastric out let obstruction   Choledocholithiasis with obstruction   Last Assessment & Plan 10/01/2012 Office  Visit Written 10/01/2012  2:28 PM by Mahala Menghini, PA-C    81 year old gentleman seen  during recent hospitalization in March with obstructing jaundice. CT showed Intra and extrahepatic biliary ductal dilatation to the level of the ampulla. Infiltration of the porta hepatis fat. EGD showed severe erosive reflux esophagitis and abnormal bulb and proximal second portion duodenum producing partial GOO with secondary gastric dilation. Biopsies were benign. ERCP with biliary sphincterotomy, ampullary balloon dilation, balloon stone extraction, biliary stent placement was performed. Biliary stent remains in place at this time. Clinically he has been doing well. Need to repeat LFTs, followup on anemia. Discussed at length with Dr. Gala Romney. At this point we will pursue ERCP/EGD with stent removal. Based on findings he may or may not need further imaging such as CT scan or EUS.  I have discussed the risks, alternatives, benefits with regards to but not limited to the risk of reaction to medication, bleeding, infection, perforation. Discussed 10% chance of pancreatitis. Patient is agreeable. Written consent to be obtained.       Gallstones     Other   Transaminitis   Abnormal LFTs   Anemia   Anxiety   MDD (major depressive disorder), recurrent severe, without psychosis ([place])   Insomnia secondary to depression with anxiety   Urinary urgency       Imaging: Dg Chest Port 1 View  02/28/2015  CLINICAL DATA:  Left chest/shoulder pain, shortness of breath x2 days EXAM: PORTABLE CHEST 1 VIEW COMPARISON:  02/27/2015 FINDINGS: Mild patchy bilateral lower lobe opacities, suspicious for pneumonia, less likely atelectasis. No definite pleural effusion. No pneumothorax. The heart is normal in size. IMPRESSION: Patchy bilateral lower lobe opacities, suspicious for pneumonia, less likely atelectasis. Electronically Signed   By: Julian Hy M.D.   On: 02/28/2015 12:27

## 2015-03-12 NOTE — Progress Notes (Signed)
Cardiology Office Note   Date:  03/12/2015   ID:  Edward Trevino, DOB [birth year], MRN [id number]  PCP:  Deloria Lair, MD  Cardiologist: Ross/  Jory Sims, NP   Chief Complaint  Patient presents with  . Coronary Artery Disease  . Cardiomyopathy      History of Present Illness: Edward Trevino is a 81 y.o. male who presents for post hospital follow up after admission for NSTEMI 02/28/15 with a large anterior MI. Given his late presentation, medical therapy vs PCI was elected.A 2D echo was obtained which showed severely depressed LVF with an EF of 20%. Coreg was started for LV dysfunction. His BP was too soft to initiated ACE therapy. He is on DAPT with Plavix and ASA.   Left ventricle: LVEF is approximately 20% with akinesis of the mid/distal septum, distal lateral, mid/distal anteiror, distal inferior and apical walls. The cavity size was normal. Wall thickness was increased in a pattern of mild LVH. - Aortic valve: There was trivial regurgitation. - Right atrium: The atrium was mildly dilated. - Pulmonary arteries: PA peak pressure: 35 mm Hg (S).  He is doing fair. He gets very dizzy when he gets up too fast. Denies rapid HR, chest pain or near syncope. He is medically compliant and is adhering to low sodium diet. He is here with his caregiver. Multiple questions are answered.   Past Medical History  Diagnosis Date  . Depression   . Insomnia   . Anxiety   . Suicide attempt ([place])   . Erosive esophagitis 07/25/2012  . Gastric out let obstruction 07/25/2012  . HOH (hard of hearing)     Past Surgical History  Procedure Laterality Date  . Appendectomy    . Back surgery    . Ercp N/A 07/24/2012    Dr. Gala Romney. Significant abnormalities of the bowl and proximal second portion producing partial gastric outlet stricture and with secondary gastric dilation and severe erosive reflux esophagitis. Biopsy showed benign ulceration. Normal-appearing ampulla, status post biliary  sphincterotomy and ampulla balloon dilation, stone extraction and stent placement.  Marland Kitchen Sphincterotomy N/A 07/24/2012    Procedure: SPHINCTEROTOMY;  Surgeon: Daneil Dolin, MD;  Location: [facility];  Service: Endoscopy;  Laterality: N/A;  . Esophageal biopsy N/A 07/24/2012    Procedure: BIOPSY;  Surgeon: Daneil Dolin, MD;  Location: [facility];  Service: Endoscopy;  Laterality: N/A;  Duodenal and Esophageal Biopsies  . Esophagogastroduodenoscopy N/A 07/24/2012    VOZ:[id number] ampulla s/p biliary sphincterotomy and ampullary  . Balloon dilation N/A 07/24/2012    Procedure: BALLOON DILATION;  Surgeon: Daneil Dolin, MD;  Location: [facility];  Service: Endoscopy;  Laterality: N/A;  Balloon Stone Extraction, Drudging  . Removal of stones N/A 07/24/2012    Procedure: REMOVAL OF STONES;  Surgeon: Daneil Dolin, MD;  Location: [facility];  Service: Endoscopy;  Laterality: N/A;  . Egd/ercp  10/18/2012    Dr. Gala Romney: ;yloric channel stenosis, s/p dilation and bx, persisting CBD stones with extension of sphincterotomy and sphincterotomy balloon dilation and stone extraction. Removal of biliary stent  . Esophagogastroduodenoscopy (egd) with propofol N/A 10/18/2012    LOV:FIEPPIR channel stenosis s/p dilation/Persisting common bile duct stones with extension of sphincterotomy     and sphincterotomy balloon dilation and stone extraction.  Biliary stent removal   . Ercp N/A 10/18/2012    Procedure: ENDOSCOPIC RETROGRADE CHOLANGIOPANCREATOGRAPHY (ERCP);  Surgeon: Daneil Dolin, MD;  Location: [facility];  Service: Endoscopy;  Laterality: N/A;  . Removal of  stones N/A 10/18/2012    Procedure: REMOVAL OF STONES;  Surgeon: Daneil Dolin, MD;  Location: [facility];  Service: Endoscopy;  Laterality: N/A;  . Esophageal biopsy N/A 10/18/2012    Procedure: BIOPSY;  Surgeon: Daneil Dolin, MD;  Location: [facility];  Service: Endoscopy;  Laterality: N/A;  . Cholecystectomy N/A 10/26/2012    Procedure: LAPAROSCOPIC CHOLECYSTECTOMY;  Surgeon: Jamesetta So, MD;  Location: [facility];  Service: General;  Laterality: N/A;  . Laparoscopy N/A 10/31/2012    Procedure: LAPAROSCOPY DIAGNOSTIC;  Surgeon: Donato Heinz, MD;  Location: [facility];  Service: General;  Laterality: N/A;     Current Outpatient Prescriptions  Medication Sig Dispense Refill  . aspirin EC 81 MG EC tablet Take 1 tablet (81 mg total) by mouth daily.    Marland Kitchen atorvastatin (LIPITOR) 80 MG tablet Take 1 tablet (80 mg total) by mouth daily at 6 PM. 30 tablet 5  . carvedilol (COREG) 6.25 MG tablet Take 1 tablet (6.25 mg total) by mouth 2 (two) times daily with a meal. 60 tablet 5  . clopidogrel (PLAVIX) 75 MG tablet Take 1 tablet (75 mg total) by mouth daily. 30 tablet 5  . mirtazapine (REMERON) 30 MG tablet Take 30 mg by mouth at bedtime.    . nitroGLYCERIN (NITROSTAT) 0.4 MG SL tablet Place 1 tablet (0.4 mg total) under the tongue every 5 (five) minutes x 3 doses as needed for chest pain. 25 tablet 2  . QUEtiapine (SEROQUEL) 100 MG tablet Take 100 mg by mouth at bedtime.    . Venlafaxine HCl 75 MG TB24 Take 75 mg by mouth at bedtime.     No current facility-administered medications for this visit.    Allergies:   Review of patient's allergies indicates no known allergies.    Social History:  The patient  reports that he has been smoking Cigars.  He has never used smokeless tobacco. He reports that he does not drink alcohol or use illicit drugs.   Family History:  The patient's family history includes Depression in his sister. There is no history of Colon cancer.    ROS: All other systems are reviewed and negative. Unless otherwise mentioned in H&P    PHYSICAL EXAM: VS:  BP 108/64 mmHg  Pulse 90  Ht 6\' 2"  (1.88 m)  Wt 195 lb 12.8 oz (88.814 kg)  BMI 25.13 kg/m2  SpO2 98% , BMI Body mass index is 25.13 kg/(m^2). GEN: Well nourished, well developed, in no acute distress HEENT: normal Neck: no JVD, carotid bruits, or masses Cardiac: RRR tachycardic; no murmurs, rubs, or  gallops,no edema  Respiratory:  clear to auscultation bilaterally, normal work of breathing GI: soft, nontender, nondistended, + BS MS: no deformity or atrophy Skin: warm and dry, no rash Neuro:  Strength and sensation are intact Psych: euthymic mood, full affect   Recent Labs: 02/28/2015: ALT 54; B Natriuretic Peptide 531.1* 03/01/2015: BUN 10; Creatinine, Ser 1.19; Magnesium 2.1; Potassium 3.8; Sodium 137 03/02/2015: Hemoglobin 12.2*; Platelets 179    Lipid Panel    Component Value Date/Time   CHOL 106 03/03/2015 0905   TRIG 101 03/03/2015 0905   HDL 38* 03/03/2015 0905   CHOLHDL 2.8 03/03/2015 0905   VLDL 20 03/03/2015 0905   LDLCALC 48 03/03/2015 0905      Wt Readings from Last 3 Encounters:  03/12/15 195 lb 12.8 oz (88.814 kg)  02/28/15 204 lb 9.4 oz (92.8 kg)  06/06/13 176 lb (79.833 kg)  ASSESSMENT AND PLAN:  1. Ischemic cardiomyopathy: EF of 20%. He is on Coreg 6.25 mg BID. I will increase this to 9.37 mg BID to control He is instructed to get up slowly and avoid over strenuous activity. He wishes to follow up in [place] instead of [place]. Continue Plavix and ASA.. Will see him again in one month.   2. NSTEMI: Continue statin, and DAPT. He is not a candidate for intervention and will be treated medically. No ACE at this time due to hypotension.   3. Anemia: Will need follow up labs on next visit.      Current medicines are reviewed at length with the patient today.    Labs/ tests ordered today include: None No orders of the defined types were placed in this encounter.     Disposition:   FU with one month  Signed, Jory Sims, NP  03/12/2015 1:51 PM    [facility] [address] [place], [place] [address] Phone: [phone]; Fax: [phone]

## 2015-03-13 DIAGNOSIS — Z915 Personal history of self-harm: Secondary | ICD-10-CM | POA: Diagnosis not present

## 2015-03-13 DIAGNOSIS — I249 Acute ischemic heart disease, unspecified: Secondary | ICD-10-CM | POA: Diagnosis not present

## 2015-03-13 DIAGNOSIS — F329 Major depressive disorder, single episode, unspecified: Secondary | ICD-10-CM | POA: Diagnosis not present

## 2015-03-13 DIAGNOSIS — Z720 Tobacco use: Secondary | ICD-10-CM | POA: Diagnosis not present

## 2015-03-13 DIAGNOSIS — I2109 ST elevation (STEMI) myocardial infarction involving other coronary artery of anterior wall: Secondary | ICD-10-CM | POA: Diagnosis not present

## 2015-03-13 DIAGNOSIS — F419 Anxiety disorder, unspecified: Secondary | ICD-10-CM | POA: Diagnosis not present

## 2015-04-06 ENCOUNTER — Encounter: Payer: Self-pay | Admitting: Internal Medicine

## 2015-04-06 ENCOUNTER — Ambulatory Visit (INDEPENDENT_AMBULATORY_CARE_PROVIDER_SITE_OTHER): Payer: Medicare Other | Admitting: Internal Medicine

## 2015-04-06 VITALS — BP 108/64 | HR 76 | Ht 75.0 in | Wt 189.0 lb

## 2015-04-06 DIAGNOSIS — D509 Iron deficiency anemia, unspecified: Secondary | ICD-10-CM

## 2015-04-06 DIAGNOSIS — Z79899 Other long term (current) drug therapy: Secondary | ICD-10-CM

## 2015-04-06 DIAGNOSIS — I2510 Atherosclerotic heart disease of native coronary artery without angina pectoris: Secondary | ICD-10-CM

## 2015-04-06 MED ORDER — LISINOPRIL 2.5 MG PO TABS: 2.5000 mg | ORAL_TABLET | Freq: Every day | ORAL | Status: DC

## 2015-04-06 NOTE — Progress Notes (Signed)
Cardiology Office Note   Date:  04/06/2015   ID:  Edward Trevino, DOB [birth year], MRN [id number]  PCP:  Deloria Lair, MD  Cardiologist:   Dorris Carnes, MD   No chief complaint on file.  F?U of CAD/CHF   History of Present Illness: Edward Trevino is a 81 y.o. male with a history of NSTEMI in October 2016  I admitted him To [facility]   Seen at [facility].  Initial trop Trop 1   MI evolved over 18 hours  By Transfer to Edward Trevino was late  EKG with diffuse Q waves   Echo with LVEF of 20%  Coreg started.    Seen in clinic in late October  Coreg increased to 9/375 bid.  On ASA and palvix.  He says he is tired a lot  No CP  Breathing is OK Eating  Watching salt       Current Outpatient Prescriptions  Medication Sig Dispense Refill  . aspirin EC 81 MG EC tablet Take 1 tablet (81 mg total) by mouth daily.    Marland Kitchen atorvastatin (LIPITOR) 80 MG tablet Take 1 tablet (80 mg total) by mouth daily at 6 PM. 30 tablet 5  . carvedilol (COREG) 6.25 MG tablet Take 1.5 tablets (9.375 mg total) by mouth 2 (two) times daily. 180 tablet 3  . clopidogrel (PLAVIX) 75 MG tablet Take 1 tablet (75 mg total) by mouth daily. 30 tablet 5  . mirtazapine (REMERON) 30 MG tablet Take 30 mg by mouth at bedtime.    . nitroGLYCERIN (NITROSTAT) 0.4 MG SL tablet Place 1 tablet (0.4 mg total) under the tongue every 5 (five) minutes x 3 doses as needed for chest pain. 25 tablet 2  . QUEtiapine (SEROQUEL) 100 MG tablet Take 100 mg by mouth at bedtime.    . Venlafaxine HCl 75 MG TB24 Take 75 mg by mouth at bedtime.     No current facility-administered medications for this visit.    Allergies:   Review of patient's allergies indicates no known allergies.   Past Medical History  Diagnosis Date  . Depression   . Insomnia   . Anxiety   . Suicide attempt ([place])   . Erosive esophagitis 07/25/2012  . Gastric out let obstruction 07/25/2012  . HOH (hard of hearing)     Past Surgical History  Procedure Laterality Date  .  Appendectomy    . Back surgery    . Ercp N/A 07/24/2012    Dr. Gala Romney. Significant abnormalities of the bowl and proximal second portion producing partial gastric outlet stricture and with secondary gastric dilation and severe erosive reflux esophagitis. Biopsy showed benign ulceration. Normal-appearing ampulla, status post biliary sphincterotomy and ampulla balloon dilation, stone extraction and stent placement.  Marland Kitchen Sphincterotomy N/A 07/24/2012    Procedure: SPHINCTEROTOMY;  Surgeon: Daneil Dolin, MD;  Location: [facility];  Service: Endoscopy;  Laterality: N/A;  . Esophageal biopsy N/A 07/24/2012    Procedure: BIOPSY;  Surgeon: Daneil Dolin, MD;  Location: [facility];  Service: Endoscopy;  Laterality: N/A;  Duodenal and Esophageal Biopsies  . Esophagogastroduodenoscopy N/A 07/24/2012    [id number] ampulla s/p biliary sphincterotomy and ampullary  . Balloon dilation N/A 07/24/2012    Procedure: BALLOON DILATION;  Surgeon: Daneil Dolin, MD;  Location: [facility];  Service: Endoscopy;  Laterality: N/A;  Balloon Stone Extraction, Drudging  . Removal of stones N/A 07/24/2012    Procedure: REMOVAL OF STONES;  Surgeon: Daneil Dolin, MD;  Location:  [facility];  Service: Endoscopy;  Laterality: N/A;  . Egd/ercp  10/18/2012    Dr. Gala Romney: ;yloric channel stenosis, s/p dilation and bx, persisting CBD stones with extension of sphincterotomy and sphincterotomy balloon dilation and stone extraction. Removal of biliary stent  . Esophagogastroduodenoscopy (egd) with propofol N/A 10/18/2012    [id number] channel stenosis s/p dilation/Persisting common bile duct stones with extension of sphincterotomy     and sphincterotomy balloon dilation and stone extraction.  Biliary stent removal   . Ercp N/A 10/18/2012    Procedure: ENDOSCOPIC RETROGRADE CHOLANGIOPANCREATOGRAPHY (ERCP);  Surgeon: Daneil Dolin, MD;  Location: [facility];  Service: Endoscopy;  Laterality: N/A;  . Removal of stones N/A 10/18/2012    Procedure: REMOVAL OF  STONES;  Surgeon: Daneil Dolin, MD;  Location: [facility];  Service: Endoscopy;  Laterality: N/A;  . Esophageal biopsy N/A 10/18/2012    Procedure: BIOPSY;  Surgeon: Daneil Dolin, MD;  Location: [facility];  Service: Endoscopy;  Laterality: N/A;  . Cholecystectomy N/A 10/26/2012    Procedure: LAPAROSCOPIC CHOLECYSTECTOMY;  Surgeon: Jamesetta So, MD;  Location: [facility];  Service: General;  Laterality: N/A;  . Laparoscopy N/A 10/31/2012    Procedure: LAPAROSCOPY DIAGNOSTIC;  Surgeon: Donato Heinz, MD;  Location: [facility];  Service: General;  Laterality: N/A;     Social History:  The patient  reports that he has been smoking Cigars.  He has never used smokeless tobacco. He reports that he does not drink alcohol or use illicit drugs.   Family History:  The patient's family history includes Depression in his sister. There is no history of Colon cancer.    ROS:  Please see the history of present illness. All other systems are reviewed and  Negative to the above problem except as noted.    PHYSICAL EXAM: VS:  BP 108/64 mmHg  Pulse 76  Ht 6\' 3"  (1.905 m)  Wt 85.73 kg (189 lb)  BMI 23.62 kg/m2  SpO2 95%  GEN: Well nourished, well developed, in no acute distress HEENT: normal Neck: no JVD, carotid bruits, or masses Cardiac: RRR; no murmurs, rubs, or gallops,no edema  Respiratory:  clear to auscultation bilaterally, normal work of breathing GI: soft, nontender, nondistended, + BS  No hepatomegaly  MS: no deformity Moving all extremities   Skin: warm and dry, no rash Neuro:  Strength and sensation are intact Psych: euthymic mood, full affect   EKG:  EKG is not ordered today.   Lipid Panel    Component Value Date/Time   CHOL 106 03/03/2015 0905   TRIG 101 03/03/2015 0905   HDL 38* 03/03/2015 0905   CHOLHDL 2.8 03/03/2015 0905   VLDL 20 03/03/2015 0905   LDLCALC 48 03/03/2015 0905      Wt Readings from Last 3 Encounters:  04/06/15 85.73 kg (189 lb)  03/12/15 88.814 kg (195 lb 12.8  oz)  02/28/15 92.8 kg (204 lb 9.4 oz)      ASSESSMENT AND PLAN:  1.  CAD  No symtpoms of angina  Pt had large MI in October  CHeck CBC and BMET   2.  Chronic systolic CHF  Volume status looks good  I would add low dose ACE I  Lisinopril 2.5  Hopefully bp will tolerate  Weigh dailly  Watch salt  F/U after New Year  3.  HL  On lipitor 80  WIll need to be followed after new wyear      Signed, Dorris Carnes, MD  04/06/2015 1:19  PM    [facility] [place], [place], [place]  [address] Phone: [phone]; Fax: [phone]

## 2015-04-06 NOTE — Patient Instructions (Signed)
Your physician recommends that you schedule a follow-up appointment in: January 2017  Your physician has recommended you make the following change in your medication:   Start Lisinopril 2.5 mg Daily  Your physician recommends that you return for lab work today. (CBC, BMET)  If you need a refill on your cardiac medications before your next appointment, please call your pharmacy.  Thank you for choosing [facility]!

## 2015-04-07 LAB — CBC WITH DIFFERENTIAL/PLATELET
BASOS PCT: 1 % (ref 0–1)
Basophils Absolute: 0.1 10*3/uL (ref 0.0–0.1)
EOS ABS: 0.2 10*3/uL (ref 0.0–0.7)
Eosinophils Relative: 4 % (ref 0–5)
HCT: 38.5 % — ABNORMAL LOW (ref 39.0–52.0)
Hemoglobin: 13.1 g/dL (ref 13.0–17.0)
Lymphocytes Relative: 28 % (ref 12–46)
Lymphs Abs: 1.7 10*3/uL (ref 0.7–4.0)
MCH: 32 pg (ref 26.0–34.0)
MCHC: 34 g/dL (ref 30.0–36.0)
MCV: 93.9 fL (ref 78.0–100.0)
MONO ABS: 0.5 10*3/uL (ref 0.1–1.0)
MPV: 9.5 fL (ref 8.6–12.4)
Monocytes Relative: 9 % (ref 3–12)
NEUTROS PCT: 58 % (ref 43–77)
Neutro Abs: 3.5 10*3/uL (ref 1.7–7.7)
PLATELETS: 218 10*3/uL (ref 150–400)
RBC: 4.1 MIL/uL — ABNORMAL LOW (ref 4.22–5.81)
RDW: 13.3 % (ref 11.5–15.5)
WBC: 6 10*3/uL (ref 4.0–10.5)

## 2015-04-07 LAB — BASIC METABOLIC PANEL
BUN: 11 mg/dL (ref 7–25)
CALCIUM: 8.5 mg/dL — AB (ref 8.6–10.3)
CO2: 24 mmol/L (ref 20–31)
CREATININE: 1.17 mg/dL — AB (ref 0.70–1.11)
Chloride: 104 mmol/L (ref 98–110)
GLUCOSE: 80 mg/dL (ref 65–99)
Potassium: 3.9 mmol/L (ref 3.5–5.3)
Sodium: 143 mmol/L (ref 135–146)

## 2015-04-13 ENCOUNTER — Ambulatory Visit: Payer: Self-pay | Admitting: Internal Medicine

## 2015-05-03 ENCOUNTER — Emergency Department (HOSPITAL_COMMUNITY): Payer: Medicare Other

## 2015-05-03 ENCOUNTER — Encounter (HOSPITAL_COMMUNITY): Payer: Self-pay | Admitting: Emergency Medicine

## 2015-05-03 ENCOUNTER — Inpatient Hospital Stay (HOSPITAL_COMMUNITY)
Admission: EM | Admit: 2015-05-03 | Discharge: 2015-05-15 | DRG: 302 | Disposition: A | Discharge status: Home / Self Care | Payer: Medicare Other | Attending: Family Medicine | Admitting: Family Medicine

## 2015-05-03 DIAGNOSIS — I951 Orthostatic hypotension: Secondary | ICD-10-CM | POA: Diagnosis present

## 2015-05-03 DIAGNOSIS — D62 Acute posthemorrhagic anemia: Secondary | ICD-10-CM | POA: Diagnosis not present

## 2015-05-03 DIAGNOSIS — Z8711 Personal history of peptic ulcer disease: Secondary | ICD-10-CM

## 2015-05-03 DIAGNOSIS — K2210 Ulcer of esophagus without bleeding: Secondary | ICD-10-CM | POA: Diagnosis present

## 2015-05-03 DIAGNOSIS — K264 Chronic or unspecified duodenal ulcer with hemorrhage: Secondary | ICD-10-CM | POA: Diagnosis not present

## 2015-05-03 DIAGNOSIS — E785 Hyperlipidemia, unspecified: Secondary | ICD-10-CM | POA: Diagnosis present

## 2015-05-03 DIAGNOSIS — K2980 Duodenitis without bleeding: Secondary | ICD-10-CM | POA: Diagnosis present

## 2015-05-03 DIAGNOSIS — I4891 Unspecified atrial fibrillation: Secondary | ICD-10-CM | POA: Diagnosis present

## 2015-05-03 DIAGNOSIS — K449 Diaphragmatic hernia without obstruction or gangrene: Secondary | ICD-10-CM | POA: Diagnosis present

## 2015-05-03 DIAGNOSIS — K222 Esophageal obstruction: Secondary | ICD-10-CM | POA: Diagnosis present

## 2015-05-03 DIAGNOSIS — I252 Old myocardial infarction: Secondary | ICD-10-CM

## 2015-05-03 DIAGNOSIS — R072 Precordial pain: Secondary | ICD-10-CM | POA: Diagnosis not present

## 2015-05-03 DIAGNOSIS — F17290 Nicotine dependence, other tobacco product, uncomplicated: Secondary | ICD-10-CM | POA: Diagnosis present

## 2015-05-03 DIAGNOSIS — K311 Adult hypertrophic pyloric stenosis: Secondary | ICD-10-CM | POA: Diagnosis not present

## 2015-05-03 DIAGNOSIS — K920 Hematemesis: Secondary | ICD-10-CM

## 2015-05-03 DIAGNOSIS — N179 Acute kidney failure, unspecified: Secondary | ICD-10-CM | POA: Diagnosis present

## 2015-05-03 DIAGNOSIS — I255 Ischemic cardiomyopathy: Secondary | ICD-10-CM | POA: Diagnosis present

## 2015-05-03 DIAGNOSIS — I5022 Chronic systolic (congestive) heart failure: Secondary | ICD-10-CM | POA: Diagnosis not present

## 2015-05-03 DIAGNOSIS — R1084 Generalized abdominal pain: Secondary | ICD-10-CM

## 2015-05-03 DIAGNOSIS — K209 Esophagitis, unspecified without bleeding: Secondary | ICD-10-CM | POA: Insufficient documentation

## 2015-05-03 DIAGNOSIS — H9190 Unspecified hearing loss, unspecified ear: Secondary | ICD-10-CM | POA: Diagnosis present

## 2015-05-03 DIAGNOSIS — K315 Obstruction of duodenum: Secondary | ICD-10-CM | POA: Diagnosis present

## 2015-05-03 DIAGNOSIS — R112 Nausea with vomiting, unspecified: Secondary | ICD-10-CM | POA: Diagnosis not present

## 2015-05-03 DIAGNOSIS — R079 Chest pain, unspecified: Secondary | ICD-10-CM

## 2015-05-03 DIAGNOSIS — Z818 Family history of other mental and behavioral disorders: Secondary | ICD-10-CM

## 2015-05-03 DIAGNOSIS — E860 Dehydration: Secondary | ICD-10-CM

## 2015-05-03 DIAGNOSIS — R630 Anorexia: Secondary | ICD-10-CM | POA: Insufficient documentation

## 2015-05-03 DIAGNOSIS — K228 Other specified diseases of esophagus: Secondary | ICD-10-CM | POA: Diagnosis present

## 2015-05-03 DIAGNOSIS — R1310 Dysphagia, unspecified: Secondary | ICD-10-CM | POA: Diagnosis present

## 2015-05-03 DIAGNOSIS — R1110 Vomiting, unspecified: Secondary | ICD-10-CM | POA: Insufficient documentation

## 2015-05-03 DIAGNOSIS — R634 Abnormal weight loss: Secondary | ICD-10-CM | POA: Insufficient documentation

## 2015-05-03 DIAGNOSIS — I25119 Atherosclerotic heart disease of native coronary artery with unspecified angina pectoris: Secondary | ICD-10-CM | POA: Diagnosis not present

## 2015-05-03 DIAGNOSIS — N183 Chronic kidney disease, stage 3 (moderate): Secondary | ICD-10-CM | POA: Diagnosis present

## 2015-05-03 DIAGNOSIS — K210 Gastro-esophageal reflux disease with esophagitis: Secondary | ICD-10-CM | POA: Diagnosis present

## 2015-05-03 DIAGNOSIS — K279 Peptic ulcer, site unspecified, unspecified as acute or chronic, without hemorrhage or perforation: Secondary | ICD-10-CM | POA: Insufficient documentation

## 2015-05-03 HISTORY — DX: Ischemic cardiomyopathy: I25.5

## 2015-05-03 HISTORY — DX: ST elevation (STEMI) myocardial infarction involving other coronary artery of anterior wall: I21.09

## 2015-05-03 LAB — CBC WITH DIFFERENTIAL/PLATELET
BASOS PCT: 0 %
Basophils Absolute: 0 10*3/uL (ref 0.0–0.1)
EOS ABS: 0 10*3/uL (ref 0.0–0.7)
Eosinophils Relative: 0 %
HEMATOCRIT: 44.2 % (ref 39.0–52.0)
Hemoglobin: 15.2 g/dL (ref 13.0–17.0)
Lymphocytes Relative: 11 %
Lymphs Abs: 1.7 10*3/uL (ref 0.7–4.0)
MCH: 32.4 pg (ref 26.0–34.0)
MCHC: 34.4 g/dL (ref 30.0–36.0)
MCV: 94.2 fL (ref 78.0–100.0)
MONO ABS: 1.1 10*3/uL — AB (ref 0.1–1.0)
MONOS PCT: 7 %
Neutro Abs: 12.3 10*3/uL — ABNORMAL HIGH (ref 1.7–7.7)
Neutrophils Relative %: 82 %
Platelets: 238 10*3/uL (ref 150–400)
RBC: 4.69 MIL/uL (ref 4.22–5.81)
RDW: 12.4 % (ref 11.5–15.5)
WBC: 15.1 10*3/uL — ABNORMAL HIGH (ref 4.0–10.5)

## 2015-05-03 LAB — BASIC METABOLIC PANEL
Anion gap: 13 (ref 5–15)
BUN: 19 mg/dL (ref 6–20)
CO2: 31 mmol/L (ref 22–32)
CREATININE: 1.33 mg/dL — AB (ref 0.61–1.24)
Calcium: 9.5 mg/dL (ref 8.9–10.3)
Chloride: 101 mmol/L (ref 101–111)
GFR calc Af Amer: 56 mL/min — ABNORMAL LOW (ref >60)
GFR calc non Af Amer: 48 mL/min — ABNORMAL LOW (ref >60)
GLUCOSE: 124 mg/dL — AB (ref 65–99)
Potassium: 3.8 mmol/L (ref 3.5–5.1)
Sodium: 145 mmol/L (ref 135–145)

## 2015-05-03 LAB — URINALYSIS, ROUTINE W REFLEX MICROSCOPIC
Glucose, UA: NEGATIVE mg/dL
Ketones, ur: 40 mg/dL — AB
Leukocytes, UA: NEGATIVE
Nitrite: NEGATIVE
Protein, ur: 30 mg/dL — AB
Specific Gravity, Urine: 1.025 (ref 1.005–1.030)
pH: 7 (ref 5.0–8.0)

## 2015-05-03 LAB — LIPASE, BLOOD: Lipase: 25 U/L (ref 11–51)

## 2015-05-03 LAB — URINE MICROSCOPIC-ADD ON
Squamous Epithelial / LPF: NONE SEEN
WBC UA: NONE SEEN WBC/hpf (ref 0–5)

## 2015-05-03 LAB — HEPATIC FUNCTION PANEL
ALBUMIN: 4.3 g/dL (ref 3.5–5.0)
ALK PHOS: 270 U/L — AB (ref 38–126)
ALT: 24 U/L (ref 17–63)
AST: 31 U/L (ref 15–41)
BILIRUBIN TOTAL: 0.9 mg/dL (ref 0.3–1.2)
Bilirubin, Direct: 0.2 mg/dL (ref 0.1–0.5)
Indirect Bilirubin: 0.7 mg/dL (ref 0.3–0.9)
Total Protein: 7.7 g/dL (ref 6.5–8.1)

## 2015-05-03 LAB — TROPONIN I: Troponin I: 0.04 ng/mL — ABNORMAL HIGH (ref <0.031)

## 2015-05-03 MED ORDER — MORPHINE SULFATE (PF) 2 MG/ML IV SOLN
2.0000 mg | INTRAVENOUS | Status: DC | PRN
  Administered 2015-05-03 – 2015-05-06 (×4): 2 mg via INTRAVENOUS
  Filled 2015-05-03 (×4): qty 1

## 2015-05-03 MED ORDER — ONDANSETRON HCL 4 MG/2ML IJ SOLN
4.0000 mg | Freq: Once | INTRAMUSCULAR | Status: AC
  Administered 2015-05-03: 4 mg via INTRAVENOUS
  Filled 2015-05-03: qty 2

## 2015-05-03 MED ORDER — ASPIRIN EC 81 MG PO TBEC
81.0000 mg | DELAYED_RELEASE_TABLET | Freq: Every day | ORAL | Status: DC
  Administered 2015-05-04 – 2015-05-07 (×4): 81 mg via ORAL
  Filled 2015-05-03 (×4): qty 1

## 2015-05-03 MED ORDER — CARVEDILOL 3.125 MG PO TABS
9.3750 mg | ORAL_TABLET | Freq: Two times a day (BID) | ORAL | Status: DC
  Administered 2015-05-04 (×2): 9.375 mg via ORAL
  Filled 2015-05-03 (×3): qty 3

## 2015-05-03 MED ORDER — MIRTAZAPINE 30 MG PO TABS
30.0000 mg | ORAL_TABLET | Freq: Every day | ORAL | Status: DC
  Administered 2015-05-03 – 2015-05-06 (×4): 30 mg via ORAL
  Filled 2015-05-03 (×4): qty 1

## 2015-05-03 MED ORDER — ZOLPIDEM TARTRATE 5 MG PO TABS
5.0000 mg | ORAL_TABLET | Freq: Every evening | ORAL | Status: DC | PRN
  Administered 2015-05-03 – 2015-05-06 (×3): 5 mg via ORAL
  Filled 2015-05-03 (×4): qty 1

## 2015-05-03 MED ORDER — QUETIAPINE FUMARATE 100 MG PO TABS
100.0000 mg | ORAL_TABLET | Freq: Every day | ORAL | Status: DC
  Administered 2015-05-03 – 2015-05-06 (×4): 100 mg via ORAL
  Filled 2015-05-03 (×4): qty 1

## 2015-05-03 MED ORDER — ONDANSETRON HCL 4 MG/2ML IJ SOLN
4.0000 mg | Freq: Four times a day (QID) | INTRAMUSCULAR | Status: DC | PRN
  Administered 2015-05-03 – 2015-05-06 (×2): 4 mg via INTRAVENOUS
  Filled 2015-05-03 (×2): qty 2

## 2015-05-03 MED ORDER — MORPHINE SULFATE (PF) 2 MG/ML IV SOLN
2.0000 mg | Freq: Once | INTRAVENOUS | Status: AC
  Administered 2015-05-03: 2 mg via INTRAVENOUS
  Filled 2015-05-03: qty 1

## 2015-05-03 MED ORDER — GI COCKTAIL ~~LOC~~
30.0000 mL | Freq: Four times a day (QID) | ORAL | Status: DC | PRN
  Administered 2015-05-03 – 2015-05-06 (×4): 30 mL via ORAL
  Filled 2015-05-03 (×4): qty 30

## 2015-05-03 MED ORDER — LISINOPRIL 5 MG PO TABS
2.5000 mg | ORAL_TABLET | Freq: Every day | ORAL | Status: DC
  Administered 2015-05-03 – 2015-05-04 (×2): 2.5 mg via ORAL
  Filled 2015-05-03 (×2): qty 1

## 2015-05-03 MED ORDER — ASPIRIN 81 MG PO CHEW
324.0000 mg | CHEWABLE_TABLET | Freq: Once | ORAL | Status: AC
Start: 2015-05-03 — End: 2015-05-03
  Administered 2015-05-03: 324 mg via ORAL

## 2015-05-03 MED ORDER — ATORVASTATIN CALCIUM 40 MG PO TABS
80.0000 mg | ORAL_TABLET | Freq: Every day | ORAL | Status: DC
  Administered 2015-05-04 – 2015-05-06 (×3): 80 mg via ORAL
  Filled 2015-05-03 (×3): qty 2
  Filled 2015-05-03 (×2): qty 1
  Filled 2015-05-03 (×2): qty 2

## 2015-05-03 MED ORDER — BISMUTH SUBSALICYLATE 262 MG/15ML PO SUSP
30.0000 mL | Freq: Four times a day (QID) | ORAL | Status: DC | PRN
  Administered 2015-05-06 (×2): 30 mL via ORAL
  Filled 2015-05-03 (×3): qty 118

## 2015-05-03 MED ORDER — NITROGLYCERIN 0.4 MG SL SUBL
0.4000 mg | SUBLINGUAL_TABLET | SUBLINGUAL | Status: DC | PRN
  Administered 2015-05-13: 0.4 mg via SUBLINGUAL
  Filled 2015-05-03 (×2): qty 1

## 2015-05-03 MED ORDER — ENOXAPARIN SODIUM 40 MG/0.4ML ~~LOC~~ SOLN
40.0000 mg | SUBCUTANEOUS | Status: DC
  Administered 2015-05-03 – 2015-05-06 (×4): 40 mg via SUBCUTANEOUS
  Filled 2015-05-03 (×4): qty 0.4

## 2015-05-03 MED ORDER — ACETAMINOPHEN 325 MG PO TABS
650.0000 mg | ORAL_TABLET | ORAL | Status: DC | PRN
Start: 2015-05-03 — End: 2015-05-07

## 2015-05-03 MED ORDER — LORAZEPAM 2 MG/ML IJ SOLN
1.0000 mg | Freq: Once | INTRAMUSCULAR | Status: AC
  Administered 2015-05-03: 1 mg via INTRAVENOUS
  Filled 2015-05-03: qty 1

## 2015-05-03 MED ORDER — SODIUM CHLORIDE 0.9 % IV BOLUS (SEPSIS)
500.0000 mL | Freq: Once | INTRAVENOUS | Status: AC
  Administered 2015-05-03: 500 mL via INTRAVENOUS

## 2015-05-03 MED ORDER — ENSURE ENLIVE PO LIQD
237.0000 mL | Freq: Two times a day (BID) | ORAL | Status: DC
  Administered 2015-05-04 – 2015-05-07 (×6): 237 mL via ORAL

## 2015-05-03 MED ORDER — CLOPIDOGREL BISULFATE 75 MG PO TABS
75.0000 mg | ORAL_TABLET | Freq: Every day | ORAL | Status: DC
  Administered 2015-05-04 – 2015-05-07 (×4): 75 mg via ORAL
  Filled 2015-05-03 (×4): qty 1

## 2015-05-03 MED ORDER — VENLAFAXINE HCL ER 75 MG PO CP24
75.0000 mg | ORAL_CAPSULE | Freq: Every day | ORAL | Status: DC
  Administered 2015-05-03 – 2015-05-06 (×4): 75 mg via ORAL
  Filled 2015-05-03 (×4): qty 1

## 2015-05-03 NOTE — ED Provider Notes (Signed)
CSN: [id number]     Arrival date & time 05/03/15  1701 History   First MD Initiated Contact with Patient 05/03/15 1712     Chief Complaint  Patient presents with  . Chest Pain    Patient is a 81 y.o. male presenting with chest pain. The history is provided by the patient and a relative.  Chest Pain Pain location:  Substernal area Pain quality: aching   Pain severity:  Moderate Onset quality:  Gradual Duration:  1 day Timing:  Constant Progression:  Unchanged Chronicity:  New Relieved by:  Nothing Worsened by:  Nothing tried Associated symptoms: abdominal pain, fatigue, nausea, shortness of breath and vomiting   Patient presents with family For past day he has had fatigue, chest pain and shortness of breath He has also had vomiting and decreased appetite Pt does not give any other details, says he feels bad   Past Medical History  Diagnosis Date  . Depression   . Insomnia   . Anxiety   . Suicide attempt ([place])   . Erosive esophagitis 07/25/2012  . Gastric out let obstruction 07/25/2012  . HOH (hard of hearing)   . MI (myocardial infarction) [facility])    Past Surgical History  Procedure Laterality Date  . Appendectomy    . Back surgery    . Ercp N/A 07/24/2012    Dr. Gala Romney. Significant abnormalities of the bowl and proximal second portion producing partial gastric outlet stricture and with secondary gastric dilation and severe erosive reflux esophagitis. Biopsy showed benign ulceration. Normal-appearing ampulla, status post biliary sphincterotomy and ampulla balloon dilation, stone extraction and stent placement.  Marland Kitchen Sphincterotomy N/A 07/24/2012    Procedure: SPHINCTEROTOMY;  Surgeon: Daneil Dolin, MD;  Location: [facility];  Service: Endoscopy;  Laterality: N/A;  . Esophageal biopsy N/A 07/24/2012    Procedure: BIOPSY;  Surgeon: Daneil Dolin, MD;  Location: [facility];  Service: Endoscopy;  Laterality: N/A;  Duodenal and Esophageal Biopsies  . Esophagogastroduodenoscopy N/A 07/24/2012     [id number] ampulla s/p biliary sphincterotomy and ampullary  . Balloon dilation N/A 07/24/2012    Procedure: BALLOON DILATION;  Surgeon: Daneil Dolin, MD;  Location: [facility];  Service: Endoscopy;  Laterality: N/A;  Balloon Stone Extraction, Drudging  . Removal of stones N/A 07/24/2012    Procedure: REMOVAL OF STONES;  Surgeon: Daneil Dolin, MD;  Location: [facility];  Service: Endoscopy;  Laterality: N/A;  . Egd/ercp  10/18/2012    Dr. Gala Romney: ;yloric channel stenosis, s/p dilation and bx, persisting CBD stones with extension of sphincterotomy and sphincterotomy balloon dilation and stone extraction. Removal of biliary stent  . Esophagogastroduodenoscopy (egd) with propofol N/A 10/18/2012    [id number] channel stenosis s/p dilation/Persisting common bile duct stones with extension of sphincterotomy     and sphincterotomy balloon dilation and stone extraction.  Biliary stent removal   . Ercp N/A 10/18/2012    Procedure: ENDOSCOPIC RETROGRADE CHOLANGIOPANCREATOGRAPHY (ERCP);  Surgeon: Daneil Dolin, MD;  Location: [facility];  Service: Endoscopy;  Laterality: N/A;  . Removal of stones N/A 10/18/2012    Procedure: REMOVAL OF STONES;  Surgeon: Daneil Dolin, MD;  Location: [facility];  Service: Endoscopy;  Laterality: N/A;  . Esophageal biopsy N/A 10/18/2012    Procedure: BIOPSY;  Surgeon: Daneil Dolin, MD;  Location: [facility];  Service: Endoscopy;  Laterality: N/A;  . Cholecystectomy N/A 10/26/2012    Procedure: LAPAROSCOPIC CHOLECYSTECTOMY;  Surgeon: Jamesetta So, MD;  Location: [facility];  Service: General;  Laterality: N/A;  . Laparoscopy N/A 10/31/2012    Procedure: LAPAROSCOPY DIAGNOSTIC;  Surgeon: Donato Heinz, MD;  Location: [facility];  Service: General;  Laterality: N/A;   Family History  Problem Relation Age of Onset  . Colon cancer Neg Hx   . Depression Sister    Social History  Substance Use Topics  . Smoking status: Current Every Day Smoker    Types: Cigars  . Smokeless tobacco: Never  Used  . Alcohol Use: No    Review of Systems  Constitutional: Positive for fatigue.  Respiratory: Positive for shortness of breath.   Cardiovascular: Positive for chest pain.  Gastrointestinal: Positive for nausea, vomiting and abdominal pain.  All other systems reviewed and are negative.     Allergies  Review of patient's allergies indicates no known allergies.  Home Medications   Prior to Admission medications   Medication Sig Start Date End Date Taking? Authorizing Provider  aspirin EC 81 MG EC tablet Take 1 tablet (81 mg total) by mouth daily. 03/04/15   Brittainy Erie Noe, PA-C  atorvastatin (LIPITOR) 80 MG tablet Take 1 tablet (80 mg total) by mouth daily at 6 PM. 03/04/15   Brittainy M Rosita Fire, PA-C  carvedilol (COREG) 6.25 MG tablet Take 1.5 tablets (9.375 mg total) by mouth 2 (two) times daily. 03/12/15   Lendon Colonel, NP  clopidogrel (PLAVIX) 75 MG tablet Take 1 tablet (75 mg total) by mouth daily. 03/05/15   Brittainy Erie Noe, PA-C  lisinopril (PRINIVIL,ZESTRIL) 2.5 MG tablet Take 1 tablet (2.5 mg total) by mouth daily. 04/06/15   Fay Records, MD  mirtazapine (REMERON) 30 MG tablet Take 30 mg by mouth at bedtime. 01/28/15   Historical Provider, MD  nitroGLYCERIN (NITROSTAT) 0.4 MG SL tablet Place 1 tablet (0.4 mg total) under the tongue every 5 (five) minutes x 3 doses as needed for chest pain. 03/04/15   Brittainy Erie Noe, PA-C  QUEtiapine (SEROQUEL) 100 MG tablet Take 100 mg by mouth at bedtime. 01/28/15   Historical Provider, MD  Venlafaxine HCl 75 MG TB24 Take 75 mg by mouth at bedtime. 01/28/15   Historical Provider, MD   BP 160/91 mmHg  Pulse 82  Temp(Src) 98 F (36.7 C) (Oral)  Resp 16  Ht 6\' 3"  (1.905 m)  Wt 81.647 kg  BMI 22.50 kg/m2  SpO2 100% Physical Exam CONSTITUTIONAL: elderly, ill appearing HEAD: Normocephalic/atraumatic EYES: EOMI/PERRL ENMT: Mucous membranes dry NECK: supple no meningeal signs SPINE/BACK:entire spine nontender CV:  S1/S2 noted, no murmurs/rubs/gallops noted LUNGS: Lungs are clear to auscultation bilaterally, no apparent distress ABDOMEN: soft, nontender, no rebound or guarding, bowel sounds noted throughout abdomen GU:no cva tenderness NEURO: Pt is awake/alert/appropriate, moves all extremitiesx4.  No facial droop.   EXTREMITIES: pulses normal/equal, full ROM SKIN: warm, color normal PSYCH: no abnormalities of mood noted, alert and oriented to situation  ED Course  Procedures  6:24 PM Pt with recent h/o MI in 02/2015, now here with CP/SOB/abd pain and vomitng He is ill appearing and actively vomiting on exam Labs/imaging ordered Will follow closely 7:25 PM Pt still reporting abd/chest discomfort No dizziness or HA reported He feels nausea No focal abd tenderness He is a poor historian and appears to be minimizing symptoms.   Troponin mildly elevated though I am not convinced this is ACS  His WBC is elevated which could indicate infection U/A pending 9:12 PM Pt still reporting chest and abdominal discomfort No focal ABD tenderness Pt still reports nausea His troponin  is mildly elevated Due to h/o MI previously with continued symptoms will admit for monitoring Pt/family agreeable D/w dr Anastasio Champion, will admit and he will see in the ED  Labs Review Labs Reviewed  BASIC METABOLIC PANEL - Abnormal; Notable for the following:    Glucose, Bld 124 (*)    Creatinine, Ser 1.33 (*)    GFR calc non Af Amer 48 (*)    GFR calc Af Amer 56 (*)    All other components within normal limits  CBC WITH DIFFERENTIAL/PLATELET - Abnormal; Notable for the following:    WBC 15.1 (*)    Neutro Abs 12.3 (*)    Monocytes Absolute 1.1 (*)    All other components within normal limits  HEPATIC FUNCTION PANEL - Abnormal; Notable for the following:    Alkaline Phosphatase 270 (*)    All other components within normal limits  TROPONIN I - Abnormal; Notable for the following:    Troponin I 0.04 (*)    All other  components within normal limits  LIPASE, BLOOD  URINALYSIS, ROUTINE W REFLEX MICROSCOPIC (NOT AT [facility])  Randolm Idol, ED    Imaging Review Dg Chest 2 View  05/03/2015  CLINICAL DATA:  81 year old male with chest pain EXAM: CHEST  2 VIEW COMPARISON:  Radiograph dated 02/28/2015 and 07/22/2012 FINDINGS: Two views of the chest demonstrate emphysematous changes of the lungs. There is no focal consolidation, pleural effusion, or pneumothorax. Stable small right lung base nodules noted. The cardiac silhouette is within normal limits. The osseous structures appear unremarkable. IMPRESSION: No active cardiopulmonary disease. Electronically Signed   By: Anner Crete M.D.   On: 05/03/2015 19:10   I have personally reviewed and evaluated these images and lab results as part of my medical decision-making.   EKG Interpretation   Date/Time:  Sunday May 03 2015 17:11:57 EST Ventricular Rate:  84 PR Interval:  222 QRS Duration: 97 QT Interval:  384 QTC Calculation: 454 R Axis:   -88 Text Interpretation:  Sinus rhythm Ventricular premature complex Prolonged  PR interval Left anterior fascicular block Anteroseptal infarct, age  indeterminate Abnormal ekg Confirmed by Christy Gentles  MD, Teryl Gubler ([id number]) on  05/03/2015 5:15:43 PM     Medications  aspirin chewable tablet 324 mg (not administered)  ondansetron (ZOFRAN) injection 4 mg (4 mg Intravenous Given 05/03/15 1752)  sodium chloride 0.9 % bolus 500 mL (0 mLs Intravenous Stopped 05/03/15 1830)  ondansetron (ZOFRAN) injection 4 mg (4 mg Intravenous Given 05/03/15 1925)  morphine 2 MG/ML injection 2 mg (2 mg Intravenous Given 05/03/15 1925)  LORazepam (ATIVAN) injection 1 mg (1 mg Intravenous Given 05/03/15 2032)    MDM   Final diagnoses:  Chest pain, unspecified chest pain type  Generalized abdominal pain  Dehydration  Non-intractable vomiting with nausea, vomiting of unspecified type    Nursing notes including past medical history  and social history reviewed and considered in documentation Labs/vital reviewed myself and considered during evaluation xrays/imaging reviewed by myself and considered during evaluation     Ripley Fraise, MD 05/03/15 2114

## 2015-05-03 NOTE — ED Notes (Signed)
Unable to complete istat troponin Resulted poc error

## 2015-05-03 NOTE — ED Provider Notes (Signed)
EKG Interpretation  Date/Time:  Sunday May 03 2015 19:58:51 EST Ventricular Rate:  83 PR Interval:  226 QRS Duration: 100 QT Interval:  403 QTC Calculation: 473 R Axis:   -100 Text Interpretation:  Sinus rhythm Multiple ventricular premature complexes Prolonged PR interval Incomplete RBBB and LAFB Anteroseptal infarct, age indeterminate No significant change since last tracing Confirmed by Christy Gentles  MD, [place] ([id number]) on 05/03/2015 8:06:29 PM        Ripley Fraise, MD 05/03/15 2114

## 2015-05-03 NOTE — ED Notes (Signed)
In and out cath completed by Sherene Sires, NT and myself, pt tolerated well. 319ml dark amber urine obtained. Sample sent to lab.

## 2015-05-03 NOTE — H&P (Signed)
[facility] History and Physical  Edward Trevino [id number] DOB: 1931/10/15 DOA: 05/03/2015  Referring physician: ER PCP: Deloria Lair, MD   Chief Complaint: Chest pain  HPI: Edward Trevino is a 81 y.o. male  This is an 81 year old man who had a large anterior MI approximately 2 months ago and seems to have done well postoperatively. In the last couple of days he has had intermittent chest pain/abdominal pain. He says that the pain is similar but not quite as severe as the pain he had with his MI. He has vomited a couple of times today also. There is no diarrhea. He denies any fever. There is no cough or palpitations. He does not feel dyspneic. He is now being admitted for further investigation.   Review of Systems:  Apart from symptoms above, all systems are negative.   Past Medical History  Diagnosis Date  . Depression   . Insomnia   . Anxiety   . Suicide attempt ([place])   . Erosive esophagitis 07/25/2012  . Gastric out let obstruction 07/25/2012  . HOH (hard of hearing)   . MI (myocardial infarction) [facility])    Past Surgical History  Procedure Laterality Date  . Appendectomy    . Back surgery    . Ercp N/A 07/24/2012    Dr. Gala Romney. Significant abnormalities of the bowl and proximal second portion producing partial gastric outlet stricture and with secondary gastric dilation and severe erosive reflux esophagitis. Biopsy showed benign ulceration. Normal-appearing ampulla, status post biliary sphincterotomy and ampulla balloon dilation, stone extraction and stent placement.  Marland Kitchen Sphincterotomy N/A 07/24/2012    Procedure: SPHINCTEROTOMY;  Surgeon: Daneil Dolin, MD;  Location: [facility];  Service: Endoscopy;  Laterality: N/A;  . Esophageal biopsy N/A 07/24/2012    Procedure: BIOPSY;  Surgeon: Daneil Dolin, MD;  Location: [facility];  Service: Endoscopy;  Laterality: N/A;  Duodenal and Esophageal Biopsies  . Esophagogastroduodenoscopy N/A 07/24/2012    [id number] ampulla s/p  biliary sphincterotomy and ampullary  . Balloon dilation N/A 07/24/2012    Procedure: BALLOON DILATION;  Surgeon: Daneil Dolin, MD;  Location: [facility];  Service: Endoscopy;  Laterality: N/A;  Balloon Stone Extraction, Drudging  . Removal of stones N/A 07/24/2012    Procedure: REMOVAL OF STONES;  Surgeon: Daneil Dolin, MD;  Location: [facility];  Service: Endoscopy;  Laterality: N/A;  . Egd/ercp  10/18/2012    Dr. Gala Romney: ;yloric channel stenosis, s/p dilation and bx, persisting CBD stones with extension of sphincterotomy and sphincterotomy balloon dilation and stone extraction. Removal of biliary stent  . Esophagogastroduodenoscopy (egd) with propofol N/A 10/18/2012    [id number] channel stenosis s/p dilation/Persisting common bile duct stones with extension of sphincterotomy     and sphincterotomy balloon dilation and stone extraction.  Biliary stent removal   . Ercp N/A 10/18/2012    Procedure: ENDOSCOPIC RETROGRADE CHOLANGIOPANCREATOGRAPHY (ERCP);  Surgeon: Daneil Dolin, MD;  Location: [facility];  Service: Endoscopy;  Laterality: N/A;  . Removal of stones N/A 10/18/2012    Procedure: REMOVAL OF STONES;  Surgeon: Daneil Dolin, MD;  Location: [facility];  Service: Endoscopy;  Laterality: N/A;  . Esophageal biopsy N/A 10/18/2012    Procedure: BIOPSY;  Surgeon: Daneil Dolin, MD;  Location: [facility];  Service: Endoscopy;  Laterality: N/A;  . Cholecystectomy N/A 10/26/2012    Procedure: LAPAROSCOPIC CHOLECYSTECTOMY;  Surgeon: Jamesetta So, MD;  Location: [facility];  Service: General;  Laterality: N/A;  . Laparoscopy N/A 10/31/2012  Procedure: LAPAROSCOPY DIAGNOSTIC;  Surgeon: Donato Heinz, MD;  Location: [facility];  Service: General;  Laterality: N/A;   Social History:  reports that he has been smoking Cigars.  He has never used smokeless tobacco. He reports that he does not drink alcohol or use illicit drugs.  No Known Allergies  Family History  Problem Relation Age of Onset  . Colon cancer Neg Hx   .  Depression Sister     Prior to Admission medications   Medication Sig Start Date End Date Taking? Authorizing Provider  aspirin EC 81 MG EC tablet Take 1 tablet (81 mg total) by mouth daily. 03/04/15  Yes Brittainy Erie Noe, PA-C  atorvastatin (LIPITOR) 80 MG tablet Take 1 tablet (80 mg total) by mouth daily at 6 PM. 03/04/15  Yes Brittainy Erie Noe, PA-C  bismuth subsalicylate (PEPTO BISMOL) 262 MG/15ML suspension Take 30 mLs by mouth every 6 (six) hours as needed for indigestion.   Yes Historical Provider, MD  carvedilol (COREG) 6.25 MG tablet Take 1.5 tablets (9.375 mg total) by mouth 2 (two) times daily. 03/12/15  Yes Lendon Colonel, NP  clopidogrel (PLAVIX) 75 MG tablet Take 1 tablet (75 mg total) by mouth daily. 03/05/15  Yes Brittainy Erie Noe, PA-C  lisinopril (PRINIVIL,ZESTRIL) 2.5 MG tablet Take 1 tablet (2.5 mg total) by mouth daily. 04/06/15  Yes Fay Records, MD  mirtazapine (REMERON) 30 MG tablet Take 30 mg by mouth at bedtime. 01/28/15  Yes Historical Provider, MD  QUEtiapine (SEROQUEL) 100 MG tablet Take 100 mg by mouth at bedtime. 01/28/15  Yes Historical Provider, MD  Venlafaxine HCl 75 MG TB24 Take 75 mg by mouth at bedtime. 01/28/15  Yes Historical Provider, MD  nitroGLYCERIN (NITROSTAT) 0.4 MG SL tablet Place 1 tablet (0.4 mg total) under the tongue every 5 (five) minutes x 3 doses as needed for chest pain. 03/04/15   Brittainy Erie Noe, PA-C   Physical Exam: Filed Vitals:   05/03/15 1800 05/03/15 1839 05/03/15 1900 05/03/15 1930  BP: 160/91 150/91 156/85 166/100  Pulse:  77 76 87  Temp:      TempSrc:      Resp: 16 17 16 22   Height:      Weight:      SpO2:  100% 96% 97%    Wt Readings from Last 3 Encounters:  05/03/15 81.647 kg (180 lb)  04/06/15 85.73 kg (189 lb)  03/12/15 88.814 kg (195 lb 12.8 oz)    General:  Appears calm and comfortable. He does not appear to be in pain at the present time.  Eyes: PERRL, normal lids, irises & conjunctiva ENT:  grossly normal hearing, lips & tongue Neck: no LAD, masses or thyromegaly Cardiovascular: RRR, no m/r/g. No LE edema. Telemetry: SR, no arrhythmias  Respiratory: CTA bilaterally, no w/r/r. Normal respiratory effort. Abdomen: soft, ntnd Skin: no rash or induration seen on limited exam Musculoskeletal: grossly normal tone BUE/BLE Psychiatric: grossly normal mood and affect, speech fluent and appropriate Neurologic: grossly non-focal.          Labs on Admission:  Basic Metabolic Panel:  Recent Labs Lab 05/03/15 1743  NA 145  K 3.8  CL 101  CO2 31  GLUCOSE 124*  BUN 19  CREATININE 1.33*  CALCIUM 9.5   Liver Function Tests:  Recent Labs Lab 05/03/15 1743  AST 31  ALT 24  ALKPHOS 270*  BILITOT 0.9  PROT 7.7  ALBUMIN 4.3    Recent Labs Lab 05/03/15 1743  LIPASE 25   No results for input(s): AMMONIA in the last 168 hours. CBC:  Recent Labs Lab 05/03/15 1743  WBC 15.1*  NEUTROABS 12.3*  HGB 15.2  HCT 44.2  MCV 94.2  PLT 238   Cardiac Enzymes:  Recent Labs Lab 05/03/15 1743  TROPONINI 0.04*    BNP (last 3 results)  Recent Labs  02/28/15 1430  BNP 531.1*    ProBNP (last 3 results) No results for input(s): PROBNP in the last 8760 hours.  CBG: No results for input(s): GLUCAP in the last 168 hours.  Radiological Exams on Admission: Dg Chest 2 View  05/03/2015  CLINICAL DATA:  81 year old male with chest pain EXAM: CHEST  2 VIEW COMPARISON:  Radiograph dated 02/28/2015 and 07/22/2012 FINDINGS: Two views of the chest demonstrate emphysematous changes of the lungs. There is no focal consolidation, pleural effusion, or pneumothorax. Stable small right lung base nodules noted. The cardiac silhouette is within normal limits. The osseous structures appear unremarkable. IMPRESSION: No active cardiopulmonary disease. Electronically Signed   By: Anner Crete M.D.   On: 05/03/2015 19:10    EKG: Independently reviewed.Sinus rhythm with changes of old  anterior MI. No acute ST elevation.  Assessment/Plan   1. Chest pain. The etiology is not clear but the history is similar to the pain he had with the MI 2 months ago. Initial troponin level is not impressive. We will cycle troponins levels. Cardiology consultation in the morning. GI cocktail if needed.   He'll be admitted under observation. Further recommendations will depend on patient's hospital progress.   Code Status: full code.    DVT Prophylaxis: Lovenox.   Family Communication: I discussed the plan with the patient at the bedside.    Disposition Plan: home when medically stable.  Time spent: 45 minutes.   Doree Albee [facility] Pager [phone].

## 2015-05-03 NOTE — ED Notes (Signed)
Patient c/o central chest pain, non-radiating. Patient reports shortness of breath with exertion, nausea, vomiting, decrease in appetite, and light headedness. Patient recently had MI x2 8 weeks ago per family. Patient unsure if he took his aspirin this morning.

## 2015-05-03 NOTE — ED Notes (Signed)
Pt's son concerned that pt keeps asking the same questions over and over, states that this just started happening since pt has been in er. Dr Christy Gentles notified,

## 2015-05-04 ENCOUNTER — Encounter (HOSPITAL_COMMUNITY): Payer: Self-pay | Admitting: Cardiology

## 2015-05-04 DIAGNOSIS — K264 Chronic or unspecified duodenal ulcer with hemorrhage: Secondary | ICD-10-CM | POA: Diagnosis not present

## 2015-05-04 DIAGNOSIS — I25119 Atherosclerotic heart disease of native coronary artery with unspecified angina pectoris: Secondary | ICD-10-CM | POA: Diagnosis not present

## 2015-05-04 DIAGNOSIS — R079 Chest pain, unspecified: Secondary | ICD-10-CM | POA: Diagnosis not present

## 2015-05-04 DIAGNOSIS — I200 Unstable angina: Secondary | ICD-10-CM | POA: Diagnosis not present

## 2015-05-04 DIAGNOSIS — I255 Ischemic cardiomyopathy: Secondary | ICD-10-CM

## 2015-05-04 DIAGNOSIS — K311 Adult hypertrophic pyloric stenosis: Secondary | ICD-10-CM | POA: Diagnosis not present

## 2015-05-04 DIAGNOSIS — E785 Hyperlipidemia, unspecified: Secondary | ICD-10-CM | POA: Diagnosis not present

## 2015-05-04 DIAGNOSIS — I951 Orthostatic hypotension: Secondary | ICD-10-CM | POA: Diagnosis not present

## 2015-05-04 DIAGNOSIS — N179 Acute kidney failure, unspecified: Secondary | ICD-10-CM | POA: Diagnosis not present

## 2015-05-04 LAB — TROPONIN I
TROPONIN I: 0.05 ng/mL — AB (ref <0.031)
Troponin I: 0.04 ng/mL — ABNORMAL HIGH (ref <0.031)

## 2015-05-04 MED ORDER — ISOSORBIDE MONONITRATE ER 30 MG PO TB24
15.0000 mg | ORAL_TABLET | Freq: Every day | ORAL | Status: DC
  Administered 2015-05-04 – 2015-05-06 (×3): 15 mg via ORAL
  Filled 2015-05-04 (×3): qty 1

## 2015-05-04 MED ORDER — SODIUM CHLORIDE 0.9 % IV SOLN
INTRAVENOUS | Status: DC
  Administered 2015-05-04 – 2015-05-05 (×3): via INTRAVENOUS
  Administered 2015-05-05: 1000 mL via INTRAVENOUS

## 2015-05-04 NOTE — Progress Notes (Signed)
Initial Nutrition Assessment  INTERVENTION:  Ensure Enlive po BID, each supplement provides 350 kcal and 20 grams of protein    NUTRITION DIAGNOSIS:   Inadequate oral intake related to poor appetite as evidenced by per patient/family report.  GOAL:   Patient will meet greater than or equal to 90% of their needs  MONITOR:   PO intake, Supplement acceptance, Labs, Weight trends  REASON FOR ASSESSMENT:   Malnutrition Screening Tool    ASSESSMENT:  Pt has recent hx of MI 2 months ago. He says his appetite has not been the same since.  His weight hx is variable reports usual body weight 175-185#. He says he was drinking Ensure at home but began to gain weight and stooped.  Mild to moderate muscle loss likely age related. He ambulates with a cane. He has a caregiver who assist him with meal preparation.    Diet Order:  Diet Heart Room service appropriate?: Yes; Fluid consistency:: Thin  Skin:   dry, intact  Last BM:   12/17   Height:   Ht Readings from Last 1 Encounters:  05/03/15 6\' 3"  (1.905 m)    Weight:   Wt Readings from Last 1 Encounters:  05/03/15 182 lb (82.555 kg)    Ideal Body Weight:   89 kg  BMI:  Body mass index is 22.75 kg/(m^2).  Estimated Nutritional Needs:   Kcal:  1826-2000  Protein:  90 gr  Fluid:  1.8-2.0 liters daily  EDUCATION NEEDS:   No education needs identified at this time  Colman Cater MS,RD,CSG,LDN Office: [phone] Pager: [phone]

## 2015-05-04 NOTE — Consult Note (Signed)
Primary cardiologist: Dr. Dorris Carnes Consulting cardiologist: Dr. Satira Sark  Reason for consultation: Chest pain  Clinical Summary Mr. Hauter is an 80 y.o.male with a history of late presentation anterior STEMI back in October that was managed conservatively, ischemic cardiomyopathy with LVEF approximately 20%. He has been on medical therapy and had follow-up with Dr. Harrington Challenger in late November at which time low-dose ACE inhibitor was added to his regimen. He presents describing intermittent chest discomfort over the weekend, somewhat vague in description but it reminds him of symptoms that he had back in October, except of much less intensity. He has not used any nitroglycerin with the symptoms, usually just lies down and waits.  Under hospital observation he has not had any recurrent chest pain at rest. Troponin I levels have been minimally abnormal in a flat pattern, not consistent with ACS. ECG shows sinus rhythm with incomplete right bundle branch block and old anterior wall infarct pattern, left anterior fascicular block.  He lives in his own home, states that his son lives behind him and does help with his medications. He also has a Actuary that comes in Monday through Friday.  No Known Allergies  Medications Scheduled Medications: . aspirin EC  81 mg Oral Daily  . atorvastatin  80 mg Oral q1800  . carvedilol  9.375 mg Oral BID WC  . clopidogrel  75 mg Oral Daily  . enoxaparin (LOVENOX) injection  40 mg Subcutaneous Q24H  . feeding supplement (ENSURE ENLIVE)  237 mL Oral BID BM  . lisinopril  2.5 mg Oral Daily  . mirtazapine  30 mg Oral QHS  . QUEtiapine  100 mg Oral QHS  . venlafaxine XR  75 mg Oral QHS    PRN Medications: acetaminophen, bismuth subsalicylate, gi cocktail, morphine injection, nitroGLYCERIN, ondansetron (ZOFRAN) IV, zolpidem   Past Medical History  Diagnosis Date  . Depression   . Insomnia   . Anxiety   . Suicide attempt ([place])   . Erosive  esophagitis   . Gastric out let obstruction   . HOH (hard of hearing)   . Ischemic cardiomyopathy     LVEF 20%  . ST elevation myocardial infarction (STEMI) of anterior wall [facility])     October 2016 - late presentation, managed conservatively    Past Surgical History  Procedure Laterality Date  . Appendectomy    . Back surgery    . Ercp N/A 07/24/2012    Dr. Gala Romney. Significant abnormalities of the bowl and proximal second portion producing partial gastric outlet stricture and with secondary gastric dilation and severe erosive reflux esophagitis. Biopsy showed benign ulceration. Normal-appearing ampulla, status post biliary sphincterotomy and ampulla balloon dilation, stone extraction and stent placement.  Marland Kitchen Sphincterotomy N/A 07/24/2012    Procedure: SPHINCTEROTOMY;  Surgeon: Daneil Dolin, MD;  Location: [facility];  Service: Endoscopy;  Laterality: N/A;  . Esophageal biopsy N/A 07/24/2012    Procedure: BIOPSY;  Surgeon: Daneil Dolin, MD;  Location: [facility];  Service: Endoscopy;  Laterality: N/A;  Duodenal and Esophageal Biopsies  . Esophagogastroduodenoscopy N/A 07/24/2012    [id number] ampulla s/p biliary sphincterotomy and ampullary  . Balloon dilation N/A 07/24/2012    Procedure: BALLOON DILATION;  Surgeon: Daneil Dolin, MD;  Location: [facility];  Service: Endoscopy;  Laterality: N/A;  Balloon Stone Extraction, Drudging  . Removal of stones N/A 07/24/2012    Procedure: REMOVAL OF STONES;  Surgeon: Daneil Dolin, MD;  Location: [facility];  Service: Endoscopy;  Laterality:  N/A;  . Egd/ercp  10/18/2012    Dr. Gala Romney: ;yloric channel stenosis, s/p dilation and bx, persisting CBD stones with extension of sphincterotomy and sphincterotomy balloon dilation and stone extraction. Removal of biliary stent  . Esophagogastroduodenoscopy (egd) with propofol N/A 10/18/2012    [id number] channel stenosis s/p dilation/Persisting common bile duct stones with extension of sphincterotomy     and  sphincterotomy balloon dilation and stone extraction.  Biliary stent removal   . Ercp N/A 10/18/2012    Procedure: ENDOSCOPIC RETROGRADE CHOLANGIOPANCREATOGRAPHY (ERCP);  Surgeon: Daneil Dolin, MD;  Location: [facility];  Service: Endoscopy;  Laterality: N/A;  . Removal of stones N/A 10/18/2012    Procedure: REMOVAL OF STONES;  Surgeon: Daneil Dolin, MD;  Location: [facility];  Service: Endoscopy;  Laterality: N/A;  . Esophageal biopsy N/A 10/18/2012    Procedure: BIOPSY;  Surgeon: Daneil Dolin, MD;  Location: [facility];  Service: Endoscopy;  Laterality: N/A;  . Cholecystectomy N/A 10/26/2012    Procedure: LAPAROSCOPIC CHOLECYSTECTOMY;  Surgeon: Jamesetta So, MD;  Location: [facility];  Service: General;  Laterality: N/A;  . Laparoscopy N/A 10/31/2012    Procedure: LAPAROSCOPY DIAGNOSTIC;  Surgeon: Donato Heinz, MD;  Location: [facility];  Service: General;  Laterality: N/A;    Family History  Problem Relation Age of Onset  . Colon cancer Neg Hx   . Depression Sister     Social History Mr. Suleiman reports that he has been smoking Cigars.  He has never used smokeless tobacco. Mr. Tartaglia reports that he does not drink alcohol.  Review of Systems Complete review of systems negative except as otherwise outlined in the clinical summary and also the following. Arthritis pains. Chronic dyspnea on exertion at NYHA class 2-3. No palpitations or syncope.  Physical Examination Blood pressure 100/58, pulse 89, temperature 98.2 F (36.8 C), temperature source Oral, resp. rate 20, height 6\' 3"  (1.905 m), weight 182 lb (82.555 kg), SpO2 97 %. No intake or output data in the 24 hours ending 05/04/15 1106  Telemetry: Sinus rhythm.  Gen.: Elderly male in no distress. HEENT: Conjunctiva and lids normal, oropharynx clear. Neck: Supple, no elevated JVP or carotid bruits, no thyromegaly. Lungs: Decreased breath sounds without crackles, nonlabored breathing at rest. Cardiac: Indistinct PMI, RRR, no S3 or soft systolic  murmur, no pericardial rub. Abdomen: Soft, nontender, bowel sounds present, no guarding or rebound. Extremities: No pitting edema, distal pulses 2+. Skin: Warm and dry. Musculoskeletal: No kyphosis. Neuropsychiatric: Alert and oriented x3, affect grossly appropriate.  Lab Results  Basic Metabolic Panel:  Recent Labs Lab 05/03/15 1743  NA 145  K 3.8  CL 101  CO2 31  GLUCOSE 124*  BUN 19  CREATININE 1.33*  CALCIUM 9.5    Liver Function Tests:  Recent Labs Lab 05/03/15 1743  AST 31  ALT 24  ALKPHOS 270*  BILITOT 0.9  PROT 7.7  ALBUMIN 4.3    CBC:  Recent Labs Lab 05/03/15 1743  WBC 15.1*  NEUTROABS 12.3*  HGB 15.2  HCT 44.2  MCV 94.2  PLT 238    Cardiac Enzymes:  Recent Labs Lab 05/03/15 1743 05/04/15 0042 05/04/15 0335  TROPONINI 0.04* 0.05* 0.04*    Imaging  Chest x-ray 05/03/2015: FINDINGS: Two views of the chest demonstrate emphysematous changes of the lungs. There is no focal consolidation, pleural effusion, or pneumothorax. Stable small right lung base nodules noted. The cardiac silhouette is within normal limits. The osseous structures appear unremarkable.  IMPRESSION: No active  cardiopulmonary disease.   Echocardiogram 02/28/2015: Study Conclusions  - Left ventricle: LVEF is approximately 20% with akinesis of the mid/distal septum, distal lateral, mid/distal anteiror, distal inferior and apical walls. The cavity size was normal. Wall thickness was increased in a pattern of mild LVH. - Aortic valve: There was trivial regurgitation. - Right atrium: The atrium was mildly dilated. - Pulmonary arteries: PA peak pressure: 35 mm Hg (S).  Impression  1. Recurrent chest pain, likely angina based on history and substrate. Cardiac markers are not consistent with clear ACS. ECG shows abnormalities consistent with his prior anterior infarct from October.  2. Ischemic cardiomyopathy from October. This was managed conservatively by  Dr. Harrington Challenger, and he continues on medical therapy. Titration has been somewhat limited by low normal to low blood pressures.  3. Patient continues on statin therapy for aggressive lipid lowering, most recent LDL 48.  Recommendations  I reviewed his medications. Would plan to continue aspirin, Plavix, Coreg, and Lipitor. Stop lisinopril for now and try to add Imdur as an antianginal. Have him ambulate in the hall today to see if he has any recurring symptoms. At this point do not anticipate any aggressive ischemic workup unless his symptoms escalate and cannot be controlled with medications.  Satira Sark, M.D., F.A.C.C.

## 2015-05-04 NOTE — Care Management Obs Status (Signed)
[facility] NOTIFICATION   Patient Details  Name: Edward Trevino MRN: [id number] Date of Birth: Sep 25, 1931   Medicare Observation Status Notification Given:  Yes    Joylene Draft, RN 05/04/2015, 11:12 AM

## 2015-05-04 NOTE — Progress Notes (Signed)
[facility] PROGRESS NOTE  Edward Trevino [id number] DOB: [birth year] DOA: 05/03/2015 PCP: Deloria Lair, MD  Assessment/Plan: 1. Chest pain. Possibly related to angina. Cardiology following and has recommended medication management. Continue asa, plavix, coreg and lipitor 2. Orthostatic hypotension. Patient is having significant dizziness on standing. BP noted to decrease into the 60s on standing. Will provide IV Fluids 3. Hyperlipidemia, continue statin  Code Status: full code Family Communication: discussed with patient Disposition Plan: discharge home once improved   Consultants:  Cardiology  Procedures:    Antibiotics:    HPI/Subjective: Dizzy on standing. Has occasional chest pain, but better since admission  Objective: Filed Vitals:   05/04/15 0636 05/04/15 1530  BP: 100/58 118/67  Pulse: 89   Temp: 98.2 F (36.8 C)   Resp: 20    No intake or output data in the 24 hours ending 05/04/15 1655 Filed Weights   05/03/15 1717 05/03/15 2305  Weight: 81.647 kg (180 lb) 82.555 kg (182 lb)    Exam:   General:  NAD  Cardiovascular: S1, S2 RRR  Respiratory: CTA B  Abdomen: soft, nt, nd, bs+  Musculoskeletal: no edema b/l   Data Reviewed: Basic Metabolic Panel:  Recent Labs Lab 05/03/15 1743  NA 145  K 3.8  CL 101  CO2 31  GLUCOSE 124*  BUN 19  CREATININE 1.33*  CALCIUM 9.5   Liver Function Tests:  Recent Labs Lab 05/03/15 1743  AST 31  ALT 24  ALKPHOS 270*  BILITOT 0.9  PROT 7.7  ALBUMIN 4.3    Recent Labs Lab 05/03/15 1743  LIPASE 25   No results for input(s): AMMONIA in the last 168 hours. CBC:  Recent Labs Lab 05/03/15 1743  WBC 15.1*  NEUTROABS 12.3*  HGB 15.2  HCT 44.2  MCV 94.2  PLT 238   Cardiac Enzymes:  Recent Labs Lab 05/03/15 1743 05/04/15 0042 05/04/15 0335  TROPONINI 0.04* 0.05* 0.04*   BNP (last 3 results)  Recent Labs  02/28/15 1430  BNP 531.1*    ProBNP (last 3 results) No  results for input(s): PROBNP in the last 8760 hours.  CBG: No results for input(s): GLUCAP in the last 168 hours.  No results found for this or any previous visit (from the past 240 hour(s)).   Studies: Dg Chest 2 View  05/03/2015  CLINICAL DATA:  81 year old male with chest pain EXAM: CHEST  2 VIEW COMPARISON:  Radiograph dated 02/28/2015 and 07/22/2012 FINDINGS: Two views of the chest demonstrate emphysematous changes of the lungs. There is no focal consolidation, pleural effusion, or pneumothorax. Stable small right lung base nodules noted. The cardiac silhouette is within normal limits. The osseous structures appear unremarkable. IMPRESSION: No active cardiopulmonary disease. Electronically Signed   By: Anner Crete M.D.   On: 05/03/2015 19:10    Scheduled Meds: . aspirin EC  81 mg Oral Daily  . atorvastatin  80 mg Oral q1800  . carvedilol  9.375 mg Oral BID WC  . clopidogrel  75 mg Oral Daily  . enoxaparin (LOVENOX) injection  40 mg Subcutaneous Q24H  . feeding supplement (ENSURE ENLIVE)  237 mL Oral BID BM  . isosorbide mononitrate  15 mg Oral QHS  . mirtazapine  30 mg Oral QHS  . QUEtiapine  100 mg Oral QHS  . venlafaxine XR  75 mg Oral QHS   Continuous Infusions: . sodium chloride 100 mL/hr at 05/04/15 1619    Active Problems:   Abnormal LFTs   Chest pain, rule  out acute myocardial infarction   Chest pain Orthostatic hypotension   Time spent: 30mins    Dejanira Pamintuan  [facility] Pager [phone]. If 7PM-7AM, please contact night-coverage at [email or web address], password [facility] 05/04/2015, 4:55 PM

## 2015-05-04 NOTE — Care Management Note (Signed)
Case Management Note  Patient Details  Name: Edward Trevino MRN: [id number] Date of Birth: 09-29-1931  Subjective/Objective:                  Pt admitted from home with CP. Pt lives alone and will return home at discharge. Pt has a hired caregiver in the home M-F, 6 hours a day. Pt has a cane that he uses for ambulation.  Action/Plan: No CM needs noted. Anticipate discharge within 24 hours.  Expected Discharge Date:  05/04/15               Expected Discharge Plan:  Home/Self Care  In-House Referral:  NA  Discharge planning Services  CM Consult  Post Acute Care Choice:  NA Choice offered to:  NA  DME Arranged:    DME Agency:     HH Arranged:    HH Agency:     Status of Service:  Completed, signed off  Medicare Important Message Given:    Date Medicare IM Given:    Medicare IM give by:    Date Additional Medicare IM Given:    Additional Medicare Important Message give by:     If discussed at [place] of Stay Meetings, dates discussed:    Additional Comments:  Joylene Draft, RN 05/04/2015, 11:35 AM

## 2015-05-05 DIAGNOSIS — K264 Chronic or unspecified duodenal ulcer with hemorrhage: Secondary | ICD-10-CM | POA: Diagnosis not present

## 2015-05-05 DIAGNOSIS — K2950 Unspecified chronic gastritis without bleeding: Secondary | ICD-10-CM | POA: Diagnosis not present

## 2015-05-05 DIAGNOSIS — D62 Acute posthemorrhagic anemia: Secondary | ICD-10-CM | POA: Diagnosis not present

## 2015-05-05 DIAGNOSIS — R110 Nausea: Secondary | ICD-10-CM | POA: Diagnosis not present

## 2015-05-05 DIAGNOSIS — K311 Adult hypertrophic pyloric stenosis: Secondary | ICD-10-CM | POA: Diagnosis not present

## 2015-05-05 DIAGNOSIS — I951 Orthostatic hypotension: Secondary | ICD-10-CM | POA: Diagnosis present

## 2015-05-05 DIAGNOSIS — K210 Gastro-esophageal reflux disease with esophagitis: Secondary | ICD-10-CM | POA: Diagnosis present

## 2015-05-05 DIAGNOSIS — I4891 Unspecified atrial fibrillation: Secondary | ICD-10-CM | POA: Diagnosis not present

## 2015-05-05 DIAGNOSIS — K2980 Duodenitis without bleeding: Secondary | ICD-10-CM | POA: Diagnosis present

## 2015-05-05 DIAGNOSIS — K279 Peptic ulcer, site unspecified, unspecified as acute or chronic, without hemorrhage or perforation: Secondary | ICD-10-CM | POA: Diagnosis not present

## 2015-05-05 DIAGNOSIS — K449 Diaphragmatic hernia without obstruction or gangrene: Secondary | ICD-10-CM | POA: Diagnosis present

## 2015-05-05 DIAGNOSIS — K222 Esophageal obstruction: Secondary | ICD-10-CM | POA: Diagnosis not present

## 2015-05-05 DIAGNOSIS — I5022 Chronic systolic (congestive) heart failure: Secondary | ICD-10-CM | POA: Diagnosis not present

## 2015-05-05 DIAGNOSIS — R634 Abnormal weight loss: Secondary | ICD-10-CM | POA: Diagnosis not present

## 2015-05-05 DIAGNOSIS — I25119 Atherosclerotic heart disease of native coronary artery with unspecified angina pectoris: Secondary | ICD-10-CM | POA: Diagnosis present

## 2015-05-05 DIAGNOSIS — Z8711 Personal history of peptic ulcer disease: Secondary | ICD-10-CM | POA: Diagnosis not present

## 2015-05-05 DIAGNOSIS — K315 Obstruction of duodenum: Secondary | ICD-10-CM | POA: Diagnosis not present

## 2015-05-05 DIAGNOSIS — N183 Chronic kidney disease, stage 3 (moderate): Secondary | ICD-10-CM | POA: Diagnosis present

## 2015-05-05 DIAGNOSIS — K920 Hematemesis: Secondary | ICD-10-CM | POA: Diagnosis not present

## 2015-05-05 DIAGNOSIS — K922 Gastrointestinal hemorrhage, unspecified: Secondary | ICD-10-CM | POA: Diagnosis not present

## 2015-05-05 DIAGNOSIS — Z818 Family history of other mental and behavioral disorders: Secondary | ICD-10-CM | POA: Diagnosis not present

## 2015-05-05 DIAGNOSIS — K2970 Gastritis, unspecified, without bleeding: Secondary | ICD-10-CM | POA: Diagnosis not present

## 2015-05-05 DIAGNOSIS — R072 Precordial pain: Secondary | ICD-10-CM

## 2015-05-05 DIAGNOSIS — R112 Nausea with vomiting, unspecified: Secondary | ICD-10-CM | POA: Diagnosis not present

## 2015-05-05 DIAGNOSIS — R630 Anorexia: Secondary | ICD-10-CM | POA: Diagnosis not present

## 2015-05-05 DIAGNOSIS — H9190 Unspecified hearing loss, unspecified ear: Secondary | ICD-10-CM | POA: Diagnosis present

## 2015-05-05 DIAGNOSIS — N179 Acute kidney failure, unspecified: Secondary | ICD-10-CM | POA: Diagnosis present

## 2015-05-05 DIAGNOSIS — I252 Old myocardial infarction: Secondary | ICD-10-CM | POA: Diagnosis not present

## 2015-05-05 DIAGNOSIS — K208 Other esophagitis: Secondary | ICD-10-CM | POA: Diagnosis not present

## 2015-05-05 DIAGNOSIS — K229 Disease of esophagus, unspecified: Secondary | ICD-10-CM | POA: Diagnosis not present

## 2015-05-05 DIAGNOSIS — R1310 Dysphagia, unspecified: Secondary | ICD-10-CM | POA: Diagnosis present

## 2015-05-05 DIAGNOSIS — F17290 Nicotine dependence, other tobacco product, uncomplicated: Secondary | ICD-10-CM | POA: Diagnosis present

## 2015-05-05 DIAGNOSIS — I255 Ischemic cardiomyopathy: Secondary | ICD-10-CM | POA: Diagnosis not present

## 2015-05-05 DIAGNOSIS — K209 Esophagitis, unspecified: Secondary | ICD-10-CM | POA: Diagnosis not present

## 2015-05-05 DIAGNOSIS — E785 Hyperlipidemia, unspecified: Secondary | ICD-10-CM | POA: Diagnosis present

## 2015-05-05 DIAGNOSIS — K2210 Ulcer of esophagus without bleeding: Secondary | ICD-10-CM | POA: Diagnosis not present

## 2015-05-05 DIAGNOSIS — K228 Other specified diseases of esophagus: Secondary | ICD-10-CM | POA: Diagnosis present

## 2015-05-05 DIAGNOSIS — R079 Chest pain, unspecified: Secondary | ICD-10-CM | POA: Diagnosis not present

## 2015-05-05 LAB — BASIC METABOLIC PANEL
ANION GAP: 6 (ref 5–15)
BUN: 23 mg/dL — ABNORMAL HIGH (ref 6–20)
CALCIUM: 8.1 mg/dL — AB (ref 8.9–10.3)
CO2: 26 mmol/L (ref 22–32)
Chloride: 107 mmol/L (ref 101–111)
Creatinine, Ser: 1.13 mg/dL (ref 0.61–1.24)
GFR calc non Af Amer: 59 mL/min — ABNORMAL LOW (ref >60)
Glucose, Bld: 116 mg/dL — ABNORMAL HIGH (ref 65–99)
Potassium: 3.8 mmol/L (ref 3.5–5.1)
Sodium: 139 mmol/L (ref 135–145)

## 2015-05-05 LAB — CBC
HCT: 35.3 % — ABNORMAL LOW (ref 39.0–52.0)
HEMOGLOBIN: 11.7 g/dL — AB (ref 13.0–17.0)
MCH: 31.5 pg (ref 26.0–34.0)
MCHC: 33.1 g/dL (ref 30.0–36.0)
MCV: 95.1 fL (ref 78.0–100.0)
Platelets: 180 10*3/uL (ref 150–400)
RBC: 3.71 MIL/uL — AB (ref 4.22–5.81)
RDW: 12.9 % (ref 11.5–15.5)
WBC: 12.5 10*3/uL — AB (ref 4.0–10.5)

## 2015-05-05 MED ORDER — CARVEDILOL 3.125 MG PO TABS
3.1250 mg | ORAL_TABLET | Freq: Two times a day (BID) | ORAL | Status: DC
  Administered 2015-05-05 – 2015-05-07 (×4): 3.125 mg via ORAL
  Filled 2015-05-05 (×5): qty 1

## 2015-05-05 MED ORDER — LORAZEPAM 1 MG PO TABS
1.0000 mg | ORAL_TABLET | Freq: Once | ORAL | Status: AC
  Administered 2015-05-05: 1 mg via ORAL
  Filled 2015-05-05: qty 1

## 2015-05-05 NOTE — Progress Notes (Signed)
[facility] PROGRESS NOTE  MAIJOR GINES [id number] DOB: 21-May-1931 DOA: 05/03/2015 PCP: Deloria Lair, MD  Summary:  This is an 81 year old male with ischemic cardiomyopathy and ejection fraction of 20% who presents to the hospital with complaints of chest pain. He had mild elevation of troponins without any clear evidence of ACS. He was seen by cardiology who felt that his chest pain may be related to angina. Medication regimen is being adjusted but is limited by ongoing orthostatic hypotension. He is currently receiving IV fluids and his cardiac medications are being continued/adjusted as his blood pressure tolerates.  Assessment/Plan: 1. Chest pain. Possibly related to angina. Cardiology following and has recommended medication management. Continue aspirin, Plavix and statin. He is also on low-dose Imdur 2. Orthostatic hypotension. Patient is still significantly orthostatic. He is currently on IV fluids. Lisinopril has been discontinued and holding Coreg for now. Recheck orthostatics in a.m . 3. Cardiomyopathy, ischemic , ejection fraction of 20%. Appears compensated at this time. Continue medical management as blood pressure tolerates. 4. Hyperlipidemia, continue statin.  Code Status: full code DVT prophylaxis: Lovenox Family Communication: discussed with patient Disposition Plan: discharge home once improved   Consultants:  Cardiology  Procedures:    Antibiotics:    HPI/Subjective: Says he doesn't feel well. He has some vomiting yesterday and feels nauseous today.  Objective: Filed Vitals:   05/05/15 0930 05/05/15 1316  BP: 99/59 97/60  Pulse: 71 65  Temp:  98.2 F (36.8 C)  Resp:  20    Intake/Output Summary (Last 24 hours) at 05/05/15 1346 Last data filed at 05/05/15 1200  Gross per 24 hour  Intake 1788.34 ml  Output    200 ml  Net 1588.34 ml   Filed Weights   05/03/15 1717 05/03/15 2305  Weight: 81.647 kg (180 lb) 82.555 kg (182 lb)     Exam:   General:  NAD  Cardiovascular: S1, S2 RRR  Respiratory: CTA B  Abdomen: soft, nt, nd, bs+  Musculoskeletal: no edema b/l   Data Reviewed: Basic Metabolic Panel:  Recent Labs Lab 05/03/15 1743 05/05/15 0541  NA 145 139  K 3.8 3.8  CL 101 107  CO2 31 26  GLUCOSE 124* 116*  BUN 19 23*  CREATININE 1.33* 1.13  CALCIUM 9.5 8.1*   Liver Function Tests:  Recent Labs Lab 05/03/15 1743  AST 31  ALT 24  ALKPHOS 270*  BILITOT 0.9  PROT 7.7  ALBUMIN 4.3    Recent Labs Lab 05/03/15 1743  LIPASE 25   No results for input(s): AMMONIA in the last 168 hours. CBC:  Recent Labs Lab 05/03/15 1743 05/05/15 0541  WBC 15.1* 12.5*  NEUTROABS 12.3*  --   HGB 15.2 11.7*  HCT 44.2 35.3*  MCV 94.2 95.1  PLT 238 180   Cardiac Enzymes:  Recent Labs Lab 05/03/15 1743 05/04/15 0042 05/04/15 0335  TROPONINI 0.04* 0.05* 0.04*   BNP (last 3 results)  Recent Labs  02/28/15 1430  BNP 531.1*    ProBNP (last 3 results) No results for input(s): PROBNP in the last 8760 hours.  CBG: No results for input(s): GLUCAP in the last 168 hours.  No results found for this or any previous visit (from the past 240 hour(s)).   Studies: Dg Chest 2 View  05/03/2015  CLINICAL DATA:  81 year old male with chest pain EXAM: CHEST  2 VIEW COMPARISON:  Radiograph dated 02/28/2015 and 07/22/2012 FINDINGS: Two views of the chest demonstrate emphysematous changes of the lungs. There is  no focal consolidation, pleural effusion, or pneumothorax. Stable small right lung base nodules noted. The cardiac silhouette is within normal limits. The osseous structures appear unremarkable. IMPRESSION: No active cardiopulmonary disease. Electronically Signed   By: Anner Crete M.D.   On: 05/03/2015 19:10    Scheduled Meds: . aspirin EC  81 mg Oral Daily  . atorvastatin  80 mg Oral q1800  . carvedilol  3.125 mg Oral BID WC  . clopidogrel  75 mg Oral Daily  . enoxaparin (LOVENOX)  injection  40 mg Subcutaneous Q24H  . feeding supplement (ENSURE ENLIVE)  237 mL Oral BID BM  . isosorbide mononitrate  15 mg Oral QHS  . mirtazapine  30 mg Oral QHS  . QUEtiapine  100 mg Oral QHS  . venlafaxine XR  75 mg Oral QHS   Continuous Infusions: . sodium chloride 1,000 mL (05/05/15 1321)    Active Problems:   Abnormal LFTs   Chest pain   Orthostatic hypotension   Cardiomyopathy, ischemic   Chronic systolic CHF (congestive heart failure) (HCC) Orthostatic hypotension   Time spent: 30mins    Jessica D Augustus  [facility] Pager [phone]. If 7PM-7AM, please contact night-coverage at [email or web address], password [facility] 05/05/2015, 1:46 PM

## 2015-05-05 NOTE — Progress Notes (Signed)
Patient: Edward Trevino / Admit Date: 05/03/2015 / Date of Encounter: 05/05/2015, 10:23 AM   Subjective: Feeling "down and out" today - somewhat weak. Last episode of CP was yesterday but he still feels this AM that his heart "isn't working like it's supposed to" - hard to clarify further.   Objective: Telemetry: NSR, frequent sinus pauses <1.3 sec Physical Exam: Blood pressure 99/59, pulse 71, temperature 98.4 F (36.9 C), temperature source Oral, resp. rate 20, height 6\' 3"  (1.905 m), weight 182 lb (82.555 kg), SpO2 95 %. General: Well developed elderly WM in no acute distress. Head: Normocephalic, atraumatic, sclera non-icteric, no xanthomas, nares are without discharge. Neck: Negative for carotid bruits. JVP not elevated. Lungs: Clear bilaterally to auscultation without wheezes, rales, or rhonchi. Breathing is unlabored. Heart: RRR S1 S2 without murmurs, rubs, or gallops.  Abdomen: Soft, non-tender, non-distended with normoactive bowel sounds. No rebound/guarding. Extremities: No clubbing or cyanosis. No edema. Distal pedal pulses are 2+ and equal bilaterally. Neuro: Alert and oriented to self, place, thought it was 2018 but knew it was 12/20. Moves all extremities spontaneously. Psych:  Responds to questions appropriately with a normal affect.   Intake/Output Summary (Last 24 hours) at 05/05/15 1023 Last data filed at 05/05/15 0600  Gross per 24 hour  Intake 1548.34 ml  Output    200 ml  Net 1348.34 ml    Inpatient Medications:  . aspirin EC  81 mg Oral Daily  . atorvastatin  80 mg Oral q1800  . carvedilol  3.125 mg Oral BID WC  . clopidogrel  75 mg Oral Daily  . enoxaparin (LOVENOX) injection  40 mg Subcutaneous Q24H  . feeding supplement (ENSURE ENLIVE)  237 mL Oral BID BM  . isosorbide mononitrate  15 mg Oral QHS  . mirtazapine  30 mg Oral QHS  . QUEtiapine  100 mg Oral QHS  . venlafaxine XR  75 mg Oral QHS   Infusions:  . sodium chloride 100 mL/hr at 05/05/15 0259     Labs:  Recent Labs  05/03/15 1743 05/05/15 0541  NA 145 139  K 3.8 3.8  CL 101 107  CO2 31 26  GLUCOSE 124* 116*  BUN 19 23*  CREATININE 1.33* 1.13  CALCIUM 9.5 8.1*    Recent Labs  05/03/15 1743  AST 31  ALT 24  ALKPHOS 270*  BILITOT 0.9  PROT 7.7  ALBUMIN 4.3    Recent Labs  05/03/15 1743 05/05/15 0541  WBC 15.1* 12.5*  NEUTROABS 12.3*  --   HGB 15.2 11.7*  HCT 44.2 35.3*  MCV 94.2 95.1  PLT 238 180    Recent Labs  05/03/15 1743 05/04/15 0042 05/04/15 0335  TROPONINI 0.04* 0.05* 0.04*     Radiology/Studies:  Dg Chest 2 View  05/03/2015  CLINICAL DATA:  81 year old male with chest pain EXAM: CHEST  2 VIEW COMPARISON:  Radiograph dated 02/28/2015 and 07/22/2012 FINDINGS: Two views of the chest demonstrate emphysematous changes of the lungs. There is no focal consolidation, pleural effusion, or pneumothorax. Stable small right lung base nodules noted. The cardiac silhouette is within normal limits. The osseous structures appear unremarkable. IMPRESSION: No active cardiopulmonary disease. Electronically Signed   By: Anner Crete M.D.   On: 05/03/2015 19:10     Assessment and Plan  11M with h/o CAD (late presenting STEMI 02/2015 managed medically without cath given late presentation) with resultant ICM/chronic systolic CHF EF [id number], HLD, depression, probable CKD stage II-III who presented to [facility] with chest  pain.  1. Recurrent angina - minimal troponin elevation, flat. EKG c/w prior infarct 02/2015. Plan to continue conservative therapy if possible. Although anti-anginal therapy is ideal as first line, he may feel better with less meds on board that soften his BP. See below re: med changes. Continue ASA, Plavix, statin.   2. Orthostatic hypotension - noted this admission to have BP dropping to 0000000 systolic. Orthostatics again positive this AM with 107/57->75/44 with pulse 74->93. IM treating with IV fluids. Lisinopril stopped. Coreg held this AM. Will  drop dose to 3.125mg  BID starting this evening if BP tolerates. Will review with MD whether to keep Imdur on board for the time being (takes very low dose QHS).  3. ICM/chronic systolic CHF -  ACEI stopped due to hypotension. BP in general has limited med titration. No signs of acute heart failure.  SignedMelina Copa PA-C Pager: [phone]   Attending note:  Patient seen and examined. Discussed the case with Ms. Purcell Mouton, I agree with her findings and recommendations. Difficult management in the face of relatively low blood pressure at baseline and now documentation of orthostasis. Some of this could be related to volume contraction, and would agree with IV fluids for now. We are temporarily holding Coreg, his lisinopril was stopped yesterday. If blood pressure stabilizes, would try to get him back on a regimen including aspirin, Plavix, statin, and perhaps Imdur as an antianginal. Whether or not Midodrin or compression hose are necessary can be considered after he gets fluid.  Satira Sark, M.D., F.A.C.C.

## 2015-05-06 ENCOUNTER — Encounter (HOSPITAL_COMMUNITY): Payer: Self-pay | Admitting: Cardiology

## 2015-05-06 DIAGNOSIS — I252 Old myocardial infarction: Secondary | ICD-10-CM

## 2015-05-06 DIAGNOSIS — I5022 Chronic systolic (congestive) heart failure: Secondary | ICD-10-CM

## 2015-05-06 DIAGNOSIS — I4891 Unspecified atrial fibrillation: Secondary | ICD-10-CM

## 2015-05-06 DIAGNOSIS — E785 Hyperlipidemia, unspecified: Secondary | ICD-10-CM | POA: Diagnosis present

## 2015-05-06 DIAGNOSIS — R079 Chest pain, unspecified: Secondary | ICD-10-CM

## 2015-05-06 LAB — CBC
HCT: 38.1 % — ABNORMAL LOW (ref 39.0–52.0)
Hemoglobin: 12.8 g/dL — ABNORMAL LOW (ref 13.0–17.0)
MCH: 31.8 pg (ref 26.0–34.0)
MCHC: 33.6 g/dL (ref 30.0–36.0)
MCV: 94.5 fL (ref 78.0–100.0)
Platelets: 179 10*3/uL (ref 150–400)
RBC: 4.03 MIL/uL — ABNORMAL LOW (ref 4.22–5.81)
RDW: 12.6 % (ref 11.5–15.5)
WBC: 11.3 10*3/uL — ABNORMAL HIGH (ref 4.0–10.5)

## 2015-05-06 LAB — BASIC METABOLIC PANEL
ANION GAP: 8 (ref 5–15)
BUN: 16 mg/dL (ref 6–20)
CHLORIDE: 104 mmol/L (ref 101–111)
CO2: 29 mmol/L (ref 22–32)
CREATININE: 0.96 mg/dL (ref 0.61–1.24)
Calcium: 8.3 mg/dL — ABNORMAL LOW (ref 8.9–10.3)
GFR calc Af Amer: >60 mL/min (ref >60)
GFR calc non Af Amer: >60 mL/min (ref >60)
Glucose, Bld: 127 mg/dL — ABNORMAL HIGH (ref 65–99)
Potassium: 3.7 mmol/L (ref 3.5–5.1)
Sodium: 141 mmol/L (ref 135–145)

## 2015-05-06 MED ORDER — POTASSIUM CHLORIDE CRYS ER 20 MEQ PO TBCR
20.0000 meq | EXTENDED_RELEASE_TABLET | Freq: Once | ORAL | Status: AC
  Administered 2015-05-06: 20 meq via ORAL
  Filled 2015-05-06: qty 1

## 2015-05-06 NOTE — Progress Notes (Signed)
PROGRESS NOTE  OSMIN AKRIDGE [id number] DOB: Apr 16, 1932 DOA: 05/03/2015 PCP: Deloria Lair, MD  Summary: 63 yomwith ischemic cardiomyopathy and ejection fraction of 20% presented to the hospital with chest pain. Had mild elevation of troponins without any clear evidence of ACS. Seen by cardiology who felt that his chest pain may be related to angina. Medication regimen is being adjusted but is limited by ongoing orthostatic hypotension.   Assessment/Plan: 1. CP, suspected angina, with associated SOB, abd pain, n/v on admission. EKG consistent with prior anterior infarct per cardiology. Continue ASA, Plavix, statin. Seems to have improved. Cardiology recommends medical management. 2. Elevated troponin. Troponins flat, not consistent with ACS. EKG nonacute.  3. Orthostatic hypotension. Positive by HR, but BP stable. Improved with IVF. Lisinopril stopped. Coreg decreased to 3.125 BID. Consider Midodrin or compression hose. 4. AKI, resolved with IVF.  5. CAD, anterior STEMI 02/2015 managed conservatively 6. Ischemic cardiomyopathy, chronic systolic CHF, LVEF [id number]. Titration of meds limited by low BP. 7. Hyperlipidemia, continue statin.    Overall improving. Continue ASA, Plavix, Coreg, and Lipitor per cardiology. Lisinopril stopped. Imdur added. No ischemic workup planned. IVF stopped.  Code Status: Full DVT prophylaxis: Lovenox Family Communication: none present, patient and appears to understand plan Disposition Plan: home 12/22  Murray Hodgkins, MD  [facility]  Pager [phone] If 7PM-7AM, please contact night-coverage at [email or web address], password [facility] 05/06/2015, 7:55 AM  LOS: 1 day   Consultants:  Cardiology  Procedures:    Antibiotics:    HPI/Subjective: Feels ok, no pain now. Poor appetite. Breathing ok.  Objective: Filed Vitals:   05/05/15 1316 05/05/15 1807 05/05/15 2117 05/06/15 0621  BP: 97/60  101/68   Pulse: 65  74   Temp: 98.2 F (36.8 C)   98.4 F (36.9 C) 98.2 F (36.8 C)  TempSrc: Oral  Oral Oral  Resp: 20  20   Height:      Weight:      SpO2: 99% 95% 94% 94%    Intake/Output Summary (Last 24 hours) at 05/06/15 0755 Last data filed at 05/06/15 0600  Gross per 24 hour  Intake   2760 ml  Output    300 ml  Net   2460 ml     Filed Weights   05/03/15 1717 05/03/15 2305  Weight: 81.647 kg (180 lb) 82.555 kg (182 lb)    Exam:    VSS, afebrile. Not hypoxic  General:  Appears calm and comfortable, sitting in chair Cardiovascular: RRR, no m/r/g. No LE edema. Telemetry: SR, no arrhythmias  Respiratory: CTA bilaterally, no w/r/r. Normal respiratory effort. Psychiatric: grossly normal mood and affect, speech fluent and appropriate  New data reviewed:  BMP unremarkable  WBC improved, 11.3  Scheduled Meds: . aspirin EC  81 mg Oral Daily  . atorvastatin  80 mg Oral q1800  . carvedilol  3.125 mg Oral BID WC  . clopidogrel  75 mg Oral Daily  . enoxaparin (LOVENOX) injection  40 mg Subcutaneous Q24H  . feeding supplement (ENSURE ENLIVE)  237 mL Oral BID BM  . isosorbide mononitrate  15 mg Oral QHS  . mirtazapine  30 mg Oral QHS  . QUEtiapine  100 mg Oral QHS  . venlafaxine XR  75 mg Oral QHS   Continuous Infusions: . sodium chloride 100 mL/hr at 05/05/15 2252    Active Problems:   Chest pain   Orthostatic hypotension   Cardiomyopathy, ischemic   Chronic systolic CHF (congestive heart failure) (HCC)   Pain in the chest  Time spent 20 minutes       By signing my name below, I, Rosalie Doctor attest that this documentation has been prepared under the direction and in the presence of Murray Hodgkins, MD Electronically signed: Rosalie Doctor, Scribe.  05/06/2015  I personally performed the services described in this documentation. All medical record entries made by the scribe were at my direction. I have reviewed the chart and agree that the record reflects my personal performance and is  accurate and complete. Murray Hodgkins, MD

## 2015-05-06 NOTE — Progress Notes (Signed)
Subjective:  Vague chest "soreness"  Feels tired    Objective:  Vital Signs in the last 24 hours: Temp:  [98.2 F (36.8 C)-98.4 F (36.9 C)] 98.2 F (36.8 C) (12/21 0621) Pulse Rate:  [65-74] 69 (12/21 0927) Resp:  [20] 20 (12/20 2117) BP: (97-133)/(60-68) 133/66 mmHg (12/21 0927) SpO2:  [94 %-99 %] 94 % (12/21 0621)  Intake/Output from previous day:  Intake/Output Summary (Last 24 hours) at 05/06/15 1042 Last data filed at 05/06/15 0600  Gross per 24 hour  Intake   2640 ml  Output    300 ml  Net   2340 ml    Physical Exam: General appearance: alert, cooperative and no distress Lungs: clear to auscultation bilaterally Heart: regular rate and rhythm Extremities: no edema Skin: cool, pale, dry   Rate: 72  Rhythm: normal sinus rhythm and runs of PAF  Lab Results:  Recent Labs  05/05/15 0541 05/06/15 0610  WBC 12.5* 11.3*  HGB 11.7* 12.8*  PLT 180 179    Recent Labs  05/05/15 0541 05/06/15 0610  NA 139 141  K 3.8 3.7  CL 107 104  CO2 26 29  GLUCOSE 116* 127*  BUN 23* 16  CREATININE 1.13 0.96    Recent Labs  05/04/15 0042 05/04/15 0335  TROPONINI 0.05* 0.04*   No results for input(s): INR in the last 72 hours.  Scheduled Meds: . aspirin EC  81 mg Oral Daily  . atorvastatin  80 mg Oral q1800  . carvedilol  3.125 mg Oral BID WC  . clopidogrel  75 mg Oral Daily  . enoxaparin (LOVENOX) injection  40 mg Subcutaneous Q24H  . feeding supplement (ENSURE ENLIVE)  237 mL Oral BID BM  . isosorbide mononitrate  15 mg Oral QHS  . mirtazapine  30 mg Oral QHS  . QUEtiapine  100 mg Oral QHS  . venlafaxine XR  75 mg Oral QHS   Continuous Infusions: . sodium chloride 100 mL/hr at 05/05/15 2252   PRN Meds:.acetaminophen, bismuth subsalicylate, gi cocktail, morphine injection, nitroGLYCERIN, ondansetron (ZOFRAN) IV, zolpidem   Imaging: Imaging results have been reviewed  Cardiac Studies: Echo 02/28/15 Study Conclusions  - Left ventricle: LVEF is  approximately 20% with akinesis of the mid/distal septum, distal lateral, mid/distal anteiror, distal inferior and apical walls. The cavity size was normal. Wall thickness was increased in a pattern of mild LVH. - Aortic valve: There was trivial regurgitation. - Right atrium: The atrium was mildly dilated. - Pulmonary arteries: PA peak pressure: 35 mm Hg (S).   Assessment/Plan:  Frail 81 y.o.male with a history of late presentation anterior STEMI back in October 2016 that was managed conservatively. He has an ischemic cardiomyopathy with LVEF approximately 20%. He has been on medical therapy. He presented 05/03/15 with chest pain and minor Troponin elevation. His B/P has been soft and this has limited Rx. He has also had PAF on telemetry CHADs VASc=3 for age and vascular disease  (? PAF explains chest pain and Troponin).   Principal Problem:   Chest pain with moderate risk of acute coronary syndrome Active Problems:   Orthostatic hypotension   Cardiomyopathy, ischemic-EF [id number]   Chronic systolic CHF (congestive heart failure) (HCC)   History of STEMI-Oct 2016- conservative Rx   Atrial fibrillation, new onset (HCC)   Erosive esophagitis   Dyslipidemia   PLAN: Will review NOAC risk with MD- HASBLED scoe=4, CHADs VASc= 3. He lives in his own home on the same property as his son.  Consider changing Coreg to Metoprolol- we may be able to use a higher dose.  Document TSH, try and keep K+ closer to 4.   Kerin Ransom PA-C 05/06/2015, 10:43 AM [phone]  Pt sen and examined  I am familiar with pt  I admitted hiim to [facility]  He was admitted on tx from [facility] in October  Presented to [facility] late after MI  Too late for intervention  Suffered large anterolateral MI  PLan for medical Rx of CP/CHF  Pt reports feeling tired  Note HR response.  Meds pulled back. I would stop IV hydration for nw.  FOllow clinically BP and HR   Chest discomfort is pleuritic  I am not convinced  cardiac in origin  Does report some sputum production  WOuld follow.    Dorris Carnes

## 2015-05-07 ENCOUNTER — Encounter (HOSPITAL_COMMUNITY)
Admission: EM | Disposition: A | Discharge status: Home / Self Care | Payer: Self-pay | Source: Home / Self Care | Attending: Internal Medicine

## 2015-05-07 ENCOUNTER — Encounter (HOSPITAL_COMMUNITY): Payer: Self-pay

## 2015-05-07 DIAGNOSIS — R630 Anorexia: Secondary | ICD-10-CM | POA: Insufficient documentation

## 2015-05-07 DIAGNOSIS — R110 Nausea: Secondary | ICD-10-CM

## 2015-05-07 DIAGNOSIS — K920 Hematemesis: Secondary | ICD-10-CM

## 2015-05-07 DIAGNOSIS — K311 Adult hypertrophic pyloric stenosis: Secondary | ICD-10-CM

## 2015-05-07 DIAGNOSIS — R1110 Vomiting, unspecified: Secondary | ICD-10-CM | POA: Insufficient documentation

## 2015-05-07 DIAGNOSIS — R634 Abnormal weight loss: Secondary | ICD-10-CM

## 2015-05-07 DIAGNOSIS — R112 Nausea with vomiting, unspecified: Secondary | ICD-10-CM

## 2015-05-07 HISTORY — DX: Adult hypertrophic pyloric stenosis: K31.1

## 2015-05-07 HISTORY — PX: ESOPHAGOGASTRODUODENOSCOPY: SHX5428

## 2015-05-07 LAB — KOH PREP: KOH Prep: NONE SEEN

## 2015-05-07 LAB — CBC WITH DIFFERENTIAL/PLATELET
BASOS ABS: 0 10*3/uL (ref 0.0–0.1)
BASOS PCT: 0 %
EOS PCT: 0 %
Eosinophils Absolute: 0 10*3/uL (ref 0.0–0.7)
HCT: 37.1 % — ABNORMAL LOW (ref 39.0–52.0)
Hemoglobin: 12.7 g/dL — ABNORMAL LOW (ref 13.0–17.0)
Lymphocytes Relative: 11 %
Lymphs Abs: 1.6 10*3/uL (ref 0.7–4.0)
MCH: 32.2 pg (ref 26.0–34.0)
MCHC: 34.2 g/dL (ref 30.0–36.0)
MCV: 93.9 fL (ref 78.0–100.0)
Monocytes Absolute: 1.4 10*3/uL — ABNORMAL HIGH (ref 0.1–1.0)
Monocytes Relative: 9 %
Neutro Abs: 11.7 10*3/uL — ABNORMAL HIGH (ref 1.7–7.7)
Neutrophils Relative %: 80 %
PLATELETS: 209 10*3/uL (ref 150–400)
RBC: 3.95 MIL/uL — ABNORMAL LOW (ref 4.22–5.81)
RDW: 12.6 % (ref 11.5–15.5)
WBC: 14.7 10*3/uL — AB (ref 4.0–10.5)

## 2015-05-07 LAB — TSH: TSH: 1.382 u[IU]/mL (ref 0.350–4.500)

## 2015-05-07 LAB — HEPATIC FUNCTION PANEL
ALBUMIN: 3.1 g/dL — AB (ref 3.5–5.0)
ALK PHOS: 202 U/L — AB (ref 38–126)
ALT: 22 U/L (ref 17–63)
AST: 24 U/L (ref 15–41)
Bilirubin, Direct: 0.2 mg/dL (ref 0.1–0.5)
Indirect Bilirubin: 0.6 mg/dL (ref 0.3–0.9)
Total Bilirubin: 0.8 mg/dL (ref 0.3–1.2)
Total Protein: 6.2 g/dL — ABNORMAL LOW (ref 6.5–8.1)

## 2015-05-07 SURGERY — EGD (ESOPHAGOGASTRODUODENOSCOPY)
Anesthesia: Moderate Sedation

## 2015-05-07 MED ORDER — SODIUM CHLORIDE 0.9 % IJ SOLN
INTRAMUSCULAR | Status: AC
  Filled 2015-05-07: qty 3

## 2015-05-07 MED ORDER — NITROGLYCERIN 0.4 MG/HR TD PT24
0.4000 mg | MEDICATED_PATCH | Freq: Every day | TRANSDERMAL | Status: DC
  Administered 2015-05-08 – 2015-05-15 (×7): 0.4 mg via TRANSDERMAL
  Filled 2015-05-07 (×12): qty 1

## 2015-05-07 MED ORDER — LIDOCAINE VISCOUS 2 % MT SOLN
OROMUCOSAL | Status: DC | PRN
  Administered 2015-05-07: 4 mL via OROMUCOSAL

## 2015-05-07 MED ORDER — MEPERIDINE HCL 100 MG/ML IJ SOLN
INTRAMUSCULAR | Status: AC
  Filled 2015-05-07: qty 2

## 2015-05-07 MED ORDER — ONDANSETRON HCL 4 MG/2ML IJ SOLN
INTRAMUSCULAR | Status: DC | PRN
  Administered 2015-05-07: 4 mg via INTRAVENOUS

## 2015-05-07 MED ORDER — METOPROLOL TARTRATE 1 MG/ML IV SOLN
5.0000 mg | Freq: Three times a day (TID) | INTRAVENOUS | Status: DC
  Administered 2015-05-07 – 2015-05-12 (×15): 5 mg via INTRAVENOUS
  Filled 2015-05-07 (×15): qty 5

## 2015-05-07 MED ORDER — MIDAZOLAM HCL 5 MG/5ML IJ SOLN
INTRAMUSCULAR | Status: DC | PRN
  Administered 2015-05-07: 1 mg via INTRAVENOUS

## 2015-05-07 MED ORDER — MEPERIDINE HCL 100 MG/ML IJ SOLN
INTRAMUSCULAR | Status: DC | PRN
  Administered 2015-05-07: 25 mg via INTRAVENOUS

## 2015-05-07 MED ORDER — FLUCONAZOLE IN SODIUM CHLORIDE 100-0.9 MG/50ML-% IV SOLN
100.0000 mg | INTRAVENOUS | Status: DC
  Administered 2015-05-08: 100 mg via INTRAVENOUS
  Filled 2015-05-07 (×3): qty 50

## 2015-05-07 MED ORDER — FLUCONAZOLE IN SODIUM CHLORIDE 200-0.9 MG/100ML-% IV SOLN
200.0000 mg | Freq: Once | INTRAVENOUS | Status: AC
  Administered 2015-05-07: 200 mg via INTRAVENOUS
  Filled 2015-05-07: qty 100

## 2015-05-07 MED ORDER — MIDAZOLAM HCL 5 MG/5ML IJ SOLN
INTRAMUSCULAR | Status: AC
  Filled 2015-05-07: qty 10

## 2015-05-07 MED ORDER — SODIUM CHLORIDE 0.9 % IV SOLN
INTRAVENOUS | Status: DC
  Administered 2015-05-07: 14:00:00 via INTRAVENOUS

## 2015-05-07 MED ORDER — STERILE WATER FOR IRRIGATION IR SOLN
Status: DC | PRN
  Administered 2015-05-07: 14:00:00

## 2015-05-07 MED ORDER — ONDANSETRON HCL 4 MG/2ML IJ SOLN
INTRAMUSCULAR | Status: AC
  Filled 2015-05-07: qty 2

## 2015-05-07 MED ORDER — LIDOCAINE VISCOUS 2 % MT SOLN
OROMUCOSAL | Status: AC
  Filled 2015-05-07: qty 15

## 2015-05-07 MED ORDER — PANTOPRAZOLE SODIUM 40 MG IV SOLR
40.0000 mg | Freq: Two times a day (BID) | INTRAVENOUS | Status: DC
  Administered 2015-05-07 – 2015-05-13 (×13): 40 mg via INTRAVENOUS
  Filled 2015-05-07 (×13): qty 40

## 2015-05-07 NOTE — Consult Note (Signed)
Referring Provider: Samuella Cota, MD Primary Care Physician:  Deloria Lair, MD Primary Gastroenterologist:  Garfield Cornea, MD   Reason for Consultation:  hematemesis  HPI: Edward Trevino is a 81 y.o. male with NSTEMI in 02/2015 with large anterior MI, late presentation treated with medical therapy, LVEF 20% started on Plavix/ASA.  Presented this admission with intermittent chest pain/abdominal pain for several days. Associated with vomiting.  During the night he had vomited up a moderate amount of bright red blood. Protonix 40mg  IV q12 hours started. Hgb 12.7. Four days ago Hgb 15.2. Alkaline phosphatase 270 four days ago. Denies constipation, diarrhea. Last BM 3 days ago. No melena, brbpr. No heartburn. Appetite is poor. Describes 15 pound weight loss since 02/2015. Patient is not on PPI as outpatient. On plavix, ASA.   H/O pyloric channel stenosis s/p dilation in 2014 with significant abnormalitites of the bulb and proximal second portion, severe erosive reflux esophagitis. H/O ERCP with stone extraction and stent placement 07/2012, follow up ERCP 10/2012 with persisting CBD stones with extension of sphincterotomy, balloon dilation and stone extraction. Biliary stent removal.   No prior colonoscopy.   Prior to Admission medications   Medication Sig Start Date End Date Taking? Authorizing Provider  aspirin EC 81 MG EC tablet Take 1 tablet (81 mg total) by mouth daily. 03/04/15  Yes Brittainy Erie Noe, PA-C  atorvastatin (LIPITOR) 80 MG tablet Take 1 tablet (80 mg total) by mouth daily at 6 PM. 03/04/15  Yes Brittainy Erie Noe, PA-C  bismuth subsalicylate (PEPTO BISMOL) 262 MG/15ML suspension Take 30 mLs by mouth every 6 (six) hours as needed for indigestion.   Yes Historical Provider, MD  carvedilol (COREG) 6.25 MG tablet Take 1.5 tablets (9.375 mg total) by mouth 2 (two) times daily. 03/12/15  Yes Lendon Colonel, NP  clopidogrel (PLAVIX) 75 MG tablet Take 1 tablet (75 mg total) by  mouth daily. 03/05/15  Yes Brittainy Erie Noe, PA-C  lisinopril (PRINIVIL,ZESTRIL) 2.5 MG tablet Take 1 tablet (2.5 mg total) by mouth daily. 04/06/15  Yes Fay Records, MD  mirtazapine (REMERON) 30 MG tablet Take 30 mg by mouth at bedtime. 01/28/15  Yes Historical Provider, MD  QUEtiapine (SEROQUEL) 100 MG tablet Take 100 mg by mouth at bedtime. 01/28/15  Yes Historical Provider, MD  Venlafaxine HCl 75 MG TB24 Take 75 mg by mouth at bedtime. 01/28/15  Yes Historical Provider, MD  nitroGLYCERIN (NITROSTAT) 0.4 MG SL tablet Place 1 tablet (0.4 mg total) under the tongue every 5 (five) minutes x 3 doses as needed for chest pain. 03/04/15   Brittainy Erie Noe, PA-C    Current Facility-Administered Medications  Medication Dose Route Frequency Provider Last Rate Last Dose  . acetaminophen (TYLENOL) tablet 650 mg  650 mg Oral Q4H PRN Nimish C Gosrani, MD      . aspirin EC tablet 81 mg  81 mg Oral Daily Doree Albee, MD   81 mg at 05/06/15 0927  . atorvastatin (LIPITOR) tablet 80 mg  80 mg Oral q1800 Kathie Dike, MD   80 mg at 05/06/15 1827  . bismuth subsalicylate (PEPTO BISMOL) 262 MG/15ML suspension 30 mL  30 mL Oral Q6H PRN Nimish Luther Parody, MD   30 mL at 05/06/15 1828  . carvedilol (COREG) tablet 3.125 mg  3.125 mg Oral BID WC Dayna N Dunn, PA-C   3.125 mg at 05/06/15 1827  . clopidogrel (PLAVIX) tablet 75 mg  75 mg Oral Daily Nimish Luther Parody, MD  75 mg at 05/06/15 0927  . enoxaparin (LOVENOX) injection 40 mg  40 mg Subcutaneous Q24H Nimish C Anastasio Champion, MD   40 mg at 05/06/15 2113  . feeding supplement (ENSURE ENLIVE) (ENSURE ENLIVE) liquid 237 mL  237 mL Oral BID BM Nimish C Gosrani, MD   237 mL at 05/06/15 1609  . gi cocktail (Maalox,Lidocaine,Donnatal)  30 mL Oral QID PRN Nimish Luther Parody, MD   30 mL at 05/06/15 1308  . isosorbide mononitrate (IMDUR) 24 hr tablet 15 mg  15 mg Oral QHS Satira Sark, MD   15 mg at 05/06/15 2113  . mirtazapine (REMERON) tablet 30 mg  30 mg Oral QHS  Nimish C Gosrani, MD   30 mg at 05/06/15 2113  . nitroGLYCERIN (NITROSTAT) SL tablet 0.4 mg  0.4 mg Sublingual Q5 Min x 3 PRN Nimish C Gosrani, MD      . ondansetron (ZOFRAN) injection 4 mg  4 mg Intravenous Q6H PRN Nimish Luther Parody, MD   4 mg at 05/06/15 0256  . pantoprazole (PROTONIX) injection 40 mg  40 mg Intravenous Q12H Theressa Millard, MD   40 mg at 05/07/15 0228  . QUEtiapine (SEROQUEL) tablet 100 mg  100 mg Oral QHS Doree Albee, MD   100 mg at 05/06/15 2113  . venlafaxine XR (EFFEXOR-XR) 24 hr capsule 75 mg  75 mg Oral QHS Nimish C Gosrani, MD   75 mg at 05/06/15 2113  . zolpidem (AMBIEN) tablet 5 mg  5 mg Oral QHS PRN Doree Albee, MD   5 mg at 05/06/15 2113    Allergies as of 05/03/2015  . (No Known Allergies)    Past Medical History  Diagnosis Date  . Depression   . Insomnia   . Anxiety   . Suicide attempt ([place])   . Erosive esophagitis   . Gastric out let obstruction   . HOH (hard of hearing)   . Ischemic cardiomyopathy     LVEF 20%  . ST elevation myocardial infarction (STEMI) of anterior wall [facility])     October 2016 - late presentation, managed conservatively    Past Surgical History  Procedure Laterality Date  . Appendectomy    . Back surgery    . Ercp N/A 07/24/2012    Dr. Gala Romney. Significant abnormalities of the bowl and proximal second portion producing partial gastric outlet stricture and with secondary gastric dilation and severe erosive reflux esophagitis. Biopsy showed benign ulceration. Normal-appearing ampulla, status post biliary sphincterotomy and ampulla balloon dilation, stone extraction and stent placement.  Marland Kitchen Sphincterotomy N/A 07/24/2012    Procedure: SPHINCTEROTOMY;  Surgeon: Daneil Dolin, MD;  Location: [facility];  Service: Endoscopy;  Laterality: N/A;  . Esophageal biopsy N/A 07/24/2012    Procedure: BIOPSY;  Surgeon: Daneil Dolin, MD;  Location: [facility];  Service: Endoscopy;  Laterality: N/A;  Duodenal and Esophageal Biopsies  .  Esophagogastroduodenoscopy N/A 07/24/2012    [id number] ampulla s/p biliary sphincterotomy and ampullary  . Balloon dilation N/A 07/24/2012    Procedure: BALLOON DILATION;  Surgeon: Daneil Dolin, MD;  Location: [facility];  Service: Endoscopy;  Laterality: N/A;  Balloon Stone Extraction, Drudging  . Removal of stones N/A 07/24/2012    Procedure: REMOVAL OF STONES;  Surgeon: Daneil Dolin, MD;  Location: [facility];  Service: Endoscopy;  Laterality: N/A;  . Egd/ercp  10/18/2012    Dr. Gala Romney: ;yloric channel stenosis, s/p dilation and bx, persisting CBD stones with extension of sphincterotomy and sphincterotomy  balloon dilation and stone extraction. Removal of biliary stent  . Esophagogastroduodenoscopy (egd) with propofol N/A 10/18/2012    [id number] channel stenosis s/p dilation/Persisting common bile duct stones with extension of sphincterotomy     and sphincterotomy balloon dilation and stone extraction.  Biliary stent removal   . Ercp N/A 10/18/2012    Procedure: ENDOSCOPIC RETROGRADE CHOLANGIOPANCREATOGRAPHY (ERCP);  Surgeon: Daneil Dolin, MD;  Location: [facility];  Service: Endoscopy;  Laterality: N/A;  . Removal of stones N/A 10/18/2012    Procedure: REMOVAL OF STONES;  Surgeon: Daneil Dolin, MD;  Location: [facility];  Service: Endoscopy;  Laterality: N/A;  . Esophageal biopsy N/A 10/18/2012    Procedure: BIOPSY;  Surgeon: Daneil Dolin, MD;  Location: [facility];  Service: Endoscopy;  Laterality: N/A;  . Cholecystectomy N/A 10/26/2012    Procedure: LAPAROSCOPIC CHOLECYSTECTOMY;  Surgeon: Jamesetta So, MD;  Location: [facility];  Service: General;  Laterality: N/A;  . Laparoscopy N/A 10/31/2012    Procedure: LAPAROSCOPY DIAGNOSTIC;  Surgeon: Donato Heinz, MD;  Location: [facility];  Service: General;  Laterality: N/A;    Family History  Problem Relation Age of Onset  . Colon cancer Neg Hx   . Depression Sister     Social History   Social History  . Marital Status: Single    Spouse Name: N/A  .  Number of Children: N/A  . Years of Education: N/A   Occupational History  . Retired    Social History Main Topics  . Smoking status: Current Every Day Smoker    Types: Cigars  . Smokeless tobacco: Never Used  . Alcohol Use: No  . Drug Use: No  . Sexual Activity: No   Other Topics Concern  . Not on file   Social History Narrative     ROS:  General: Negative for  fever, chills. +fatigue and weakness. See hpi. Eyes: Negative for vision changes.  ENT: Negative for hoarseness, difficulty swallowing , nasal congestion. CV: Negative for chest pain, angina, palpitations, dyspnea on exertion, peripheral edema.  Respiratory: Negative for dyspnea at rest, dyspnea on exertion, cough, sputum, wheezing.  GI: See history of present illness. GU:  Negative for dysuria, hematuria, urinary incontinence, urinary frequency, nocturnal urination.  MS: Negative for joint pain, low back pain.  Derm: Negative for rash or itching.  Neuro: Negative for weakness, abnormal sensation, seizure, frequent headaches, memory loss, confusion.  Psych: Negative for anxiety, depression, suicidal ideation, hallucinations.  Endo: see hpi Heme: Negative for bruising or bleeding. Allergy: Negative for rash or hives.       Physical Examination: Vital signs in last 24 hours: Temp:  [97.6 F (36.4 C)-98.4 F (36.9 C)] 97.6 F (36.4 C) (12/22 0610) Pulse Rate:  [69-104] 94 (12/22 0610) Resp:  [19-20] 20 (12/22 0610) BP: (94-149)/(65-80) 94/65 mmHg (12/22 0610) SpO2:  [93 %-99 %] 93 % (12/22 0610) Last BM Date: 05/02/15  General: Well-nourished, well-developed in no acute distress.  Head: Normocephalic, atraumatic.   Eyes: Conjunctiva pink, no icterus. Mouth: Oropharyngeal mucosa moist and pink , no lesions erythema or exudate. Neck: Supple without thyromegaly, masses, or lymphadenopathy.  Lungs: Clear to auscultation bilaterally.  Heart: Regular rate and rhythm, no murmurs rubs or gallops.  Abdomen: Bowel  sounds are normal, nontender, nondistended, no hepatosplenomegaly or masses, no abdominal bruits or    hernia , no rebound or guarding.   Rectal: not performed Extremities: No lower extremity edema, clubbing, deformity.  Neuro: Alert and oriented x 4 ,  grossly normal neurologically.  Skin: Warm and dry, no rash or jaundice.   Psych: Alert and cooperative, normal mood and affect.        Intake/Output from previous day: 12/21 0701 - 12/22 0700 In: 120 [P.O.:120] Out: 100 [Emesis/NG output:100] Intake/Output this shift:    Lab Results: CBC  Recent Labs  05/05/15 0541 05/06/15 0610 05/07/15 0231  WBC 12.5* 11.3* 14.7*  HGB 11.7* 12.8* 12.7*  HCT 35.3* 38.1* 37.1*  MCV 95.1 94.5 93.9  PLT 180 179 209   BMET  Recent Labs  05/05/15 0541 05/06/15 0610  NA 139 141  K 3.8 3.7  CL 107 104  CO2 26 29  GLUCOSE 116* 127*  BUN 23* 16  CREATININE 1.13 0.96  CALCIUM 8.1* 8.3*   LFT Lab Results  Component Value Date   ALT 24 05/03/2015   AST 31 05/03/2015   ALKPHOS 270* 05/03/2015   BILITOT 0.9 05/03/2015     Lipase Lab Results  Component Value Date   LIPASE 25 05/03/2015     PT/INR No results for input(s): LABPROT, INR in the last 72 hours.    Imaging Studies: Dg Chest 2 View  05/03/2015  CLINICAL DATA:  81 year old male with chest pain EXAM: CHEST  2 VIEW COMPARISON:  Radiograph dated 02/28/2015 and 07/22/2012 FINDINGS: Two views of the chest demonstrate emphysematous changes of the lungs. There is no focal consolidation, pleural effusion, or pneumothorax. Stable small right lung base nodules noted. The cardiac silhouette is within normal limits. The osseous structures appear unremarkable. IMPRESSION: No active cardiopulmonary disease. Electronically Signed   By: Anner Crete M.D.   On: 05/03/2015 19:10  [4 week]   Impression: 81 y/o male with recent large anterior MI 02/2015, LVEF 20% treated with medical management, started on Plavix/ASA at that time who  presents with recurrent chest pain/abdominal pain intermittent N/V. Developed hematemesis overnight. No melena. Some drop in H/H likely in part dilutional. History of severe erosive reflux esophagitis and pyloric channel stenosis in the past (2014) and CBD stones as outlined. Currently LFTs normal except elevated alkphos. I suspect severe reflux esophagitis, gastritis, PUD in setting of plavix/asa. Patient complains of poor appetite and weight loss. Cannot exclude M-W tear but patient definitely has some chronic symptoms.   Plan: 1. LFTs today. 2. PPI BID. 3. NPO for now.  4. Would recommend diagnostic EGD if stable from cardiac standpoint in the setting of ongoing plavix/asa. Last dose of Lovenox at 2113. Discussed briefly with Jory Sims NP. 5. Would appreciate cardiology input regarding cardiac stability for possible EGD.   We would like to thank you for the opportunity to participate in the care of YAAKOV SEPPALA.  Laureen Ochs. Bernarda Caffey [facility] [phone] 12/22/20169:40 AM     LOS: 2 days   EGD this afternoon in setting of plavix/asa.  Laureen Ochs. Bernarda Caffey [facility] [phone] 12/22/201612:13 PM

## 2015-05-07 NOTE — Progress Notes (Signed)
Reviewed results of EGD When antiplt agents resumed would only resume ecASA 81 mg  No plavix.   Will continue to follow

## 2015-05-07 NOTE — Op Note (Signed)
[facility] [address] [place], [address]   ENDOSCOPY PROCEDURE REPORT  PATIENT: Edward Trevino, Edward Trevino  MR#: [id number] BIRTHDATE: 1931-11-11 , 43  yrs. old GENDER: male ENDOSCOPIST: R.  Garfield Cornea, MD FACP FACG REFERRED BY:  Edward Trevino, M.D.  Edward Trevino, M.D. PROCEDURE DATE:  05/21/2015 PROCEDURE:  EGD, diagnostic INDICATIONS:  hematemesis. MEDICATIONS: Versed 1 mg IV and Demerol 25 mg IV.  Xylocaine gel orally.  Zofran 4 mg IV. ASA CLASS:      Class III  CONSENT: The risks, benefits, limitations, alternatives and imponderables have been discussed.  The potential for biopsy, esophogeal dilation, etc. have also been reviewed.  Questions have been answered.  All parties agreeable.  Please see the history and physical in the medical record for more information.  DESCRIPTION OF PROCEDURE: After the risks benefits and alternatives of the procedure were thoroughly explained, informed consent was obtained.  The [id number]) endoscope was introduced through the mouth and advanced to the pyloric channel/ peptic ulcer disease and edema precluded completion of examination.s   Patient with exuberant inflammation of the entire tubular esophagus with the circumferential linear dark exudate and overlying cream-colored plaques lining most the tubular esophagus.  The lumen remained patent throughout its course.  A large amount of retained food and liquid debris found in the stomach.  This precluded complete examination of the gastric mucosa.  The pyloric channel/duodenal bulb was markedly abnormal with at least a 1 cm deep stellate shaped ulcer in the proximal bulb. Markedly friable edematous mucosa surrounding the ulcer. The lumen of the duodenum at this level was not seen  Retroflexed views revealed as previously described.    Subsequently, the cream-colored plaques in the esophagus was brushed for KOH prep.  The scope was then withdrawn from the patient and the  procedure completed.  COMPLICATIONS: There were no immediate complications.  ENDOSCOPIC IMPRESSION: Severe esophagitis likely with a major reflux component?"rule out coexisting Candida infection. Peptic ulcer disease producing high-grade gastric outlet obstruction.  Currently upper GI track nonfunctional. I suspect more bleeding from the esophagus than from the duodenal ulcer.  RECOMMENDATIONS: Nothing by mouth except for ice chips.    Continue a twice a day PPI therapy.     If feasible, would stop Plavix / ASA for 5 days (in fact, all of his oral medications need to be transitioned to the parenteral route) and then repeat EGD in attempt to access lumen through pyloric channel to balloon dilate.  He is a very poor surgical candidate at this time. We'll follow-up on KOH prep.  I have discussed my findings and recommendations with Dr. Sarajane Jews as well as the patient's son, Edward Trevino at [phone]  REPEAT EXAM:  eSigned:  R. Garfield Cornea, MD Rosalita Chessman [facility] 05/21/15 2:53 PM    CC:  CPT CODES: ICD CODES:  The ICD and CPT codes recommended by this software are interpretations from the data that the clinical staff has captured with the software.  The verification of the translation of this report to the ICD and CPT codes and modifiers is the sole responsibility of the health care institution and practicing physician where this report was generated.  [employer] will not be held responsible for the validity of the ICD and CPT codes included on this report.  AMA assumes no liability for data contained or not contained herein. CPT is a Designer, television/film set of the [employer].  PATIENT NAME:  Edward Trevino, Edward Trevino MR#: [id number]

## 2015-05-07 NOTE — Evaluation (Signed)
Physical Therapy Evaluation Patient Details Name: Edward Trevino MRN: [id number] DOB: 02-Dec-1931 Today's Date: 05/07/2015   History of Present Illness  This is an 81 year old man who had a large anterior MI approximately 2 months ago and seems to have done well postoperatively.He had intermittent chest pain/abdominal pain prior to admission on 05/03/2015.   He stated that the pain iwas similar but not quite as severe as the pain he had with his MI. He had vomited a couple of times today also. There is no diarrhea. He denies any fever. There is no cough or palpitations. He does not feel dyspneic. He was admitted for further investig ation  Clinical Impression  Pt able to complete bed mobility and ambulation with mod I.  Pt prefers cane to walker.  Pt will benefit from nursing staff ambulating pt but does not need skilled physical therapy services at this time.   Follow Up Recommendations No PT follow up    Equipment Recommendations   none    Recommendations for Other Services       Precautions / Restrictions Precautions Precautions: None Restrictions Weight Bearing Restrictions: No      Mobility  Bed Mobility Overal bed mobility: Modified Independent                Transfers Overall transfer level: Modified independent                  Ambulation/Gait Ambulation/Gait assistance: Modified independent (Device/Increase time) Ambulation Distance (Feet): 150 Feet Assistive device: Rolling walker (2 wheeled) Gait Pattern/deviations: WFL(Within Functional Limits)   Gait velocity interpretation: at or above normal speed for age/gender General Gait Details: ambulated a second time with Mod I with cane x 100 feet             Pertinent Vitals/Pain Pain Assessment: No/denies pain    Home Living Family/patient expects to be discharged to:: Private residence Living Arrangements: Alone Available Help at Discharge: Family;Personal care attendant Type of Home:  House       Home Layout: One level Home Equipment: Kasandra Knudsen - single point Additional Comments: Pt has a care giver from 9-3 M-Friday.  Son lives behind him and checks in at night and weekends    Prior Function Level of Independence: Needs assistance   Gait / Transfers Assistance Needed: cane  ADL's / Homemaking Assistance Needed: care giver completes           Extremity/Trunk Assessment               Lower Extremity Assessment: Overall WFL for tasks assessed         Communication   Communication: No difficulties  Cognition Arousal/Alertness: Awake/alert   Overall Cognitive Status: Within Functional Limits for tasks assessed                               Assessment/Plan    PT Assessment Patent does not need any further PT services  PT Diagnosis     PT Problem List    PT Treatment Interventions     PT Goals (Current goals can be found in the Care Plan section)      Frequency             End of Session Equipment Utilized During Treatment: Gait belt   Patient left: in chair;with call bell/phone within reach           Time: 1050-1117 PT Time Calculation (min) (ACUTE  ONLY): 27 min   Charges:   PT Evaluation $Initial PT Evaluation Tier I: 1 Procedure           Rayetta Humphrey, PT CLT [phone] 05/07/2015, 11:17 AM

## 2015-05-07 NOTE — Progress Notes (Signed)
Patient to have EGD today. Patient has been alert and oriented today, family reported forgetful at times per off going nurse during report. Notified Lurline Del, RN in endoscopy that consent had not been signed. No family at bedside when patient was picked up for endo. Stated she would have consent signed once he was downstairs. Provided contact numbers for his sons and daughter-in-law for Lurline Del, RN. Pt stated he signs own consents and did not want his family called. RN also stated MD is aware pt has been on plavix and lovenox. Pt left floor in stable condition via w/c accompanied by endo staff. Donavan Foil, RN

## 2015-05-07 NOTE — Progress Notes (Signed)
Resting with eyes closed, HOB elevated, no distress noted, remains NPO

## 2015-05-07 NOTE — Progress Notes (Signed)
PROGRESS NOTE  Edward Trevino [id number] DOB: Feb 01, 1932 DOA: 05/03/2015 PCP: Edward Lair, MD  Summary: 14 yomwith ischemic cardiomyopathy and ejection fraction of 20% presented to the hospital with chest pain. Had mild elevation of troponins without any clear evidence of ACS. Seen by cardiology who felt that his chest pain may be related to angina. Medication regimen is being adjusted but is limited by ongoing orthostatic hypotension.   Assessment/Plan: 1. Hematemesis. EGD revealed severe esophagitis, duodenal ulcer and GOO. Hgb stable. Plan as below. 2. CP, suspected angina, with associated SOB, abd pain, n/v on admission. EKG consistent with prior anterior infarct per cardiology.  ASA and Plavix on hold. Resume ASA ASAP. 3. Elevated troponin. Troponins flat, not consistent with ACS. EKG nonacute.  4. Orthostatic hypotension.  Lisinopril stopped. Coreg decreased to 3.125 BID. Consider Midodrin or compression hose if needed 5. AKI, resolved with IVF.  6. CAD, anterior STEMI 02/2015 managed conservatively 7. Ischemic cardiomyopathy, chronic systolic CHF, LVEF Q000111Q. Titration of meds limited by low BP. 8. Hyperlipidemia, continue statin.    Appears stable from cardiac standpoint. No pain.  Discussed with Dr. Susanne Trevino NPO, stop Plavix. Stop ASA. Will need relook EGD in 4 days with hope of dilation of GOO. If fails, will need TPN. PPI BID, NGT if needed, empiric Diflucan.  Hgb currently stable with no evidence of ongoing bleeding.    ASA and Plavix on hold. D/w Dr. Harrington Trevino. Resume ASA ASAP.  Check CBC in a.m.   Code Status: Full DVT prophylaxis: Lovenox Family Communication: Caretaker present, discussed care plan with her and the patient.  Disposition Plan: Discharge home pending GI/cardiology recommendations  Edward Hodgkins, MD  [facility]  Pager [phone] If 7PM-7AM, please contact night-coverage at [email or web address], password [facility] 05/07/2015, 6:38 AM  LOS: 2  days   Consultants:  Cardiology  GI  Procedures:    Antibiotics:    HPI/Subjective: Feels okay. Denies any pain, nausea, or vomiting. Had a few episodes of hematemesis overnight.  Objective: Filed Vitals:   05/06/15 1403 05/06/15 2056 05/07/15 0211 05/07/15 0610  BP: 149/80 140/78 114/74 94/65  Pulse: 80 91 104 94  Temp: 98.4 F (36.9 C) 97.7 F (36.5 C) 98 F (36.7 C) 97.6 F (36.4 C)  TempSrc: Oral Oral Oral Oral  Resp: 19 20 20 20   Height:      Weight:      SpO2: 99% 98% 97% 93%    Intake/Output Summary (Last 24 hours) at 05/07/15 [id number] Last data filed at 05/06/15 1310  Gross per 24 hour  Intake    120 ml  Output    100 ml  Net     20 ml     Filed Weights   05/03/15 1717 05/03/15 2305  Weight: 81.647 kg (180 lb) 82.555 kg (182 lb)    Exam:    VSS, afebrile. Not hypoxic  General:  Appears comfortable, calm. Sitting up in chair.  Cardiovascular: Regular rate and rhythm, no murmur, rub or gallop. No lower extremity edema. Telemetry: Sinus rhythm, no arrhythmias  Respiratory: Clear to auscultation bilaterally, no wheezes, rales or rhonchi. Normal respiratory effort. Abdomen: soft, ntnd Psychiatric: grossly normal mood and affect, speech fluent and appropriate Neurologic: grossly non-focal.   New data reviewed:  Hgb 12.7  WBC 14.7, CBC otherwise unremarkable.   TSH 1.382  Scheduled Meds: . aspirin EC  81 mg Oral Daily  . atorvastatin  80 mg Oral q1800  . carvedilol  3.125 mg Oral BID WC  . clopidogrel  75 mg Oral Daily  . enoxaparin (LOVENOX) injection  40 mg Subcutaneous Q24H  . feeding supplement (ENSURE ENLIVE)  237 mL Oral BID BM  . isosorbide mononitrate  15 mg Oral QHS  . mirtazapine  30 mg Oral QHS  . pantoprazole (PROTONIX) IV  40 mg Intravenous Q12H  . QUEtiapine  100 mg Oral QHS  . venlafaxine XR  75 mg Oral QHS   Continuous Infusions:    Principal Problem:   Hematemesis with nausea Active Problems:   Erosive  esophagitis   Chest pain with moderate risk of acute coronary syndrome   Orthostatic hypotension   Cardiomyopathy, ischemic-EF 123456   Chronic systolic CHF (congestive heart failure) (HCC)   History of STEMI-Oct 2016- conservative Rx   Dyslipidemia   Atrial fibrillation, new onset (HCC)   Emesis   Loss of weight   Poor appetite   Gastric outlet obstruction   Time spent 20 minutes  By signing my name below, I, Edward Trevino attest that this documentation has been prepared under the direction and in the presence of Edward Hodgkins, MD Electronically signed: Rosalie Trevino, Scribe.  05/07/2015 10:27am    I personally performed the services described in this documentation. All medical record entries made by the scribe were at my direction. I have reviewed the chart and agree that the record reflects my personal performance and is accurate and complete. Edward Hodgkins, MD

## 2015-05-07 NOTE — Progress Notes (Signed)
Entered room to find that Edward Trevino had vomited up a moderate amount of bright red blood. VSS. MD paged and orders given for stat CBC and H&H every 8 hours x6. Protonix 40mg  IV every 12 hours with first dose stat.

## 2015-05-07 NOTE — Progress Notes (Signed)
Subjective: Pt feels weak  No CP  Breathing is stable   Objective: Filed Vitals:   05/06/15 2056 05/07/15 0211 05/07/15 0610 05/07/15 0700  BP: 140/78 114/74 94/65   Pulse: 91 104 94   Temp: 97.7 F (36.5 C) 98 F (36.7 C) 97.6 F (36.4 C)   TempSrc: Oral Oral Oral   Resp: 20 20 20    Height:      Weight:      SpO2: 98% 97% 93% 94%   Weight change:   Intake/Output Summary (Last 24 hours) at 05/07/15 1158 Last data filed at 05/07/15 0800  Gross per 24 hour  Intake    120 ml  Output    100 ml  Net     20 ml    General: Alert, awake, oriented x3, in no acute distress Neck:  JVP is normal Heart: Regular rate and rhythm, without murmurs, rubs, gallops.  Lungs: Clear to auscultation.  No rales or wheezes. Exemities:  No edema.   Neuro: Grossly intact, nonfocal.  Tel:  No arrhythmia  Lab Results: Results for orders placed or performed during the hospital encounter of 05/03/15 (from the past 24 hour(s))  TSH     Status: None   Collection Time: 05/07/15  2:31 AM  Result Value Ref Range   TSH 1.382 0.350 - 4.500 uIU/mL  CBC with Differential/Platelet     Status: Abnormal   Collection Time: 05/07/15  2:31 AM  Result Value Ref Range   WBC 14.7 (H) 4.0 - 10.5 K/uL   RBC 3.95 (L) 4.22 - 5.81 MIL/uL   Hemoglobin 12.7 (L) 13.0 - 17.0 g/dL   HCT 37.1 (L) 39.0 - 52.0 %   MCV 93.9 78.0 - 100.0 fL   MCH 32.2 26.0 - 34.0 pg   MCHC 34.2 30.0 - 36.0 g/dL   RDW 12.6 11.5 - 15.5 %   Platelets 209 150 - 400 K/uL   Neutrophils Relative % 80 %   Neutro Abs 11.7 (H) 1.7 - 7.7 K/uL   Lymphocytes Relative 11 %   Lymphs Abs 1.6 0.7 - 4.0 K/uL   Monocytes Relative 9 %   Monocytes Absolute 1.4 (H) 0.1 - 1.0 K/uL   Eosinophils Relative 0 %   Eosinophils Absolute 0.0 0.0 - 0.7 K/uL   Basophils Relative 0 %   Basophils Absolute 0.0 0.0 - 0.1 K/uL   WBC Morphology WHITE COUNT CONFIRMED ON SMEAR   Hepatic function panel     Status: Abnormal   Collection Time: 05/07/15 10:45 AM  Result  Value Ref Range   Total Protein 6.2 (L) 6.5 - 8.1 g/dL   Albumin 3.1 (L) 3.5 - 5.0 g/dL   AST 24 15 - 41 U/L   ALT 22 17 - 63 U/L   Alkaline Phosphatase 202 (H) 38 - 126 U/L   Total Bilirubin 0.8 0.3 - 1.2 mg/dL   Bilirubin, Direct 0.2 0.1 - 0.5 mg/dL   Indirect Bilirubin 0.6 0.3 - 0.9 mg/dL    Studies/Results: No results found.  Medications: REviewed     @PROBHOSP @  1  CAD No symtoms to sugg angina Pt with large MI this fall  Not revascularized.  FOllow    .2  Chronic systolic CHF due to CAD  Volume is OK  Follow  3.  Hemetemesis .  Note plan for possible EGD  From cardiac standpoint I think he is ok to proceed  Low risk for this procedure  May need to come off of  plavix.    Dorris Carnes     LOS: 2 days   Dorris Carnes 05/07/2015, 11:58 AM

## 2015-05-08 DIAGNOSIS — K209 Esophagitis, unspecified without bleeding: Secondary | ICD-10-CM | POA: Insufficient documentation

## 2015-05-08 DIAGNOSIS — K311 Adult hypertrophic pyloric stenosis: Secondary | ICD-10-CM

## 2015-05-08 DIAGNOSIS — K279 Peptic ulcer, site unspecified, unspecified as acute or chronic, without hemorrhage or perforation: Secondary | ICD-10-CM

## 2015-05-08 LAB — COMPREHENSIVE METABOLIC PANEL
ALK PHOS: 185 U/L — AB (ref 38–126)
ALT: 20 U/L (ref 17–63)
AST: 23 U/L (ref 15–41)
Albumin: 2.8 g/dL — ABNORMAL LOW (ref 3.5–5.0)
Anion gap: 4 — ABNORMAL LOW (ref 5–15)
BILIRUBIN TOTAL: 0.6 mg/dL (ref 0.3–1.2)
BUN: 19 mg/dL (ref 6–20)
CO2: 26 mmol/L (ref 22–32)
CREATININE: 1 mg/dL (ref 0.61–1.24)
Calcium: 8.2 mg/dL — ABNORMAL LOW (ref 8.9–10.3)
Chloride: 109 mmol/L (ref 101–111)
GFR calc Af Amer: >60 mL/min (ref >60)
GFR calc non Af Amer: >60 mL/min (ref >60)
Glucose, Bld: 94 mg/dL (ref 65–99)
Potassium: 3.9 mmol/L (ref 3.5–5.1)
SODIUM: 139 mmol/L (ref 135–145)
TOTAL PROTEIN: 5.6 g/dL — AB (ref 6.5–8.1)

## 2015-05-08 LAB — CBC
HEMATOCRIT: 32.3 % — AB (ref 39.0–52.0)
HEMOGLOBIN: 10.7 g/dL — AB (ref 13.0–17.0)
MCH: 31.7 pg (ref 26.0–34.0)
MCHC: 33.1 g/dL (ref 30.0–36.0)
MCV: 95.6 fL (ref 78.0–100.0)
Platelets: 191 10*3/uL (ref 150–400)
RBC: 3.38 MIL/uL — ABNORMAL LOW (ref 4.22–5.81)
RDW: 12.8 % (ref 11.5–15.5)
WBC: 8 10*3/uL (ref 4.0–10.5)

## 2015-05-08 LAB — MAGNESIUM: Magnesium: 2 mg/dL (ref 1.7–2.4)

## 2015-05-08 MED ORDER — SODIUM CHLORIDE 0.9 % IV SOLN
INTRAVENOUS | Status: DC
  Administered 2015-05-08: 14:00:00 via INTRAVENOUS
  Administered 2015-05-09 (×2): 50 mL/h via INTRAVENOUS
  Administered 2015-05-10: 1000 mL via INTRAVENOUS

## 2015-05-08 NOTE — Progress Notes (Signed)
Subjective: No CP  Breathing is OK  Fatigued   Objective: Filed Vitals:   05/07/15 2203 05/08/15 0709 05/08/15 1027 05/08/15 1103  BP: 123/66 102/60 111/66 99/64  Pulse: 71 64 70 65  Temp: 97.8 F (36.6 C) 97.8 F (36.6 C) 97.7 F (36.5 C)   TempSrc: Oral Oral    Resp: 18 18 16    Height:      Weight:      SpO2: 100% 97% 97%    Weight change:   Intake/Output Summary (Last 24 hours) at 05/08/15 1246 Last data filed at 05/07/15 1417  Gross per 24 hour  Intake    700 ml  Output      0 ml  Net    700 ml    General: Alert, awake, oriented x3, in no acute distress Neck:  JVP is normal Heart: Regular rate and rhythm, without murmurs, rubs, gallops.  Lungs: Clear to auscultation.  No rales or wheezes. Exemities:  No edema.   Neuro: Grossly intact, nonfocal.  Tele:  SR   Lab Results: Results for orders placed or performed during the hospital encounter of 05/03/15 (from the past 24 hour(s))  KOH prep     Status: None   Collection Time: 05/07/15  2:10 PM  Result Value Ref Range   Specimen Description ESOPHAGUS BRUSHING    Special Requests NONE    KOH Prep      NO YEAST OR FUNGAL ELEMENTS SEEN Performed at [facility]    Report Status 05/07/2015 FINAL   CBC     Status: Abnormal   Collection Time: 05/08/15  6:38 AM  Result Value Ref Range   WBC 8.0 4.0 - 10.5 K/uL   RBC 3.38 (L) 4.22 - 5.81 MIL/uL   Hemoglobin 10.7 (L) 13.0 - 17.0 g/dL   HCT 32.3 (L) 39.0 - 52.0 %   MCV 95.6 78.0 - 100.0 fL   MCH 31.7 26.0 - 34.0 pg   MCHC 33.1 30.0 - 36.0 g/dL   RDW 12.8 11.5 - 15.5 %   Platelets 191 150 - 400 K/uL  Comprehensive metabolic panel     Status: Abnormal   Collection Time: 05/08/15  6:38 AM  Result Value Ref Range   Sodium 139 135 - 145 mmol/L   Potassium 3.9 3.5 - 5.1 mmol/L   Chloride 109 101 - 111 mmol/L   CO2 26 22 - 32 mmol/L   Glucose, Bld 94 65 - 99 mg/dL   BUN 19 6 - 20 mg/dL   Creatinine, Ser 1.00 0.61 - 1.24 mg/dL   Calcium 8.2 (L) 8.9 - 10.3  mg/dL   Total Protein 5.6 (L) 6.5 - 8.1 g/dL   Albumin 2.8 (L) 3.5 - 5.0 g/dL   AST 23 15 - 41 U/L   ALT 20 17 - 63 U/L   Alkaline Phosphatase 185 (H) 38 - 126 U/L   Total Bilirubin 0.6 0.3 - 1.2 mg/dL   GFR calc non Af Amer >60 >60 mL/min   GFR calc Af Amer >60 >60 mL/min   Anion gap 4 (L) 5 - 15  Magnesium     Status: None   Collection Time: 05/08/15  6:38 AM  Result Value Ref Range   Magnesium 2.0 1.7 - 2.4 mg/dL    Studies/Results: No results found.  Medications: Reviewed    @PROBHOSP @  1  CAD No symptoms of angina  Off of ASA and Plavix  I would not resume Plavix but ASA when OK  by GI  2  Chronic systolic CHF  Volume status OK Pt currently NPO  Will start IV fluids 50 cc per hour until taking PO     LOS: 3 days   Dorris Carnes 05/08/2015, 12:46 PM

## 2015-05-08 NOTE — Progress Notes (Signed)
Patient had a 2 degree AV block on telemetry, also he had a 2.05 sec pause, B/P 99/65,hr 65. Dr Sarajane Jews notified. Orders received,and given. No c/o pain or discomfort noted. Will continue to monitor patient.

## 2015-05-08 NOTE — Progress Notes (Signed)
Patient wants to go home. Denies abdominal pain. No BM overnight.  KOH prep negative for yeast in esophagus   Vital signs in last 24 hours: Temp:  [97.1 F (36.2 C)-97.8 F (36.6 C)] 97.7 F (36.5 C) (12/23 1300) Pulse Rate:  [64-71] 64 (12/23 1300) Resp:  [16-18] 18 (12/23 1300) BP: (99-123)/(60-66) 122/65 mmHg (12/23 1300) SpO2:  [97 %-100 %] 100 % (12/23 1300) Last BM Date: 05/02/15 General:   Alert,  Well-developed, pleasant and cooperative in NAD Abdomen:  Full. Positive bowel sounds. Soft and nontender. No mass. Extremities:  Without clubbing or edema.    Intake/Output from previous day: 12/22 0701 - 12/23 0700 In: 700 [I.V.:700] Out: 250 [Urine:250] Intake/Output this shift:    Lab Results:  Recent Labs  05/06/15 0610 05/07/15 0231 05/08/15 0638  WBC 11.3* 14.7* 8.0  HGB 12.8* 12.7* 10.7*  HCT 38.1* 37.1* 32.3*  PLT 179 209 191   BMET  Recent Labs  05/06/15 0610 05/08/15 0638  NA 141 139  K 3.7 3.9  CL 104 109  CO2 29 26  GLUCOSE 127* 94  BUN 16 19  CREATININE 0.96 1.00  CALCIUM 8.3* 8.2*   LFT  Recent Labs  05/07/15 1045 05/08/15 0638  PROT 6.2* 5.6*  ALBUMIN 3.1* 2.8*  AST 24 23  ALT 22 20  ALKPHOS 202* 185*  BILITOT 0.8 0.6  BILIDIR 0.2  --   IBILI 0.6  --    PT/INR No results for input(s): LABPROT, INR in the last 72 hours. Hepatitis Panel No results for input(s): HEPBSAG, HCVAB, HEPAIGM, HEPBIGM in the last 72 hours. C-Diff No results for input(s): CDIFFTOX in the last 72 hours.  Studies/Results: No results found.  Assessment: Principal Problem:   Hematemesis with nausea Active Problems:   Erosive esophagitis   Chest pain with moderate risk of acute coronary syndrome   Orthostatic hypotension   Cardiomyopathy, ischemic-EF 123456   Chronic systolic CHF (congestive heart failure) (HCC)   History of STEMI-Oct 2016- conservative Rx   Dyslipidemia   Atrial fibrillation, new onset (HCC)   Emesis   Loss of weight   Poor  appetite   Gastric outlet obstruction  Impression:  Upper GI bleed secondary to severe reflux esophagitis. Peptic ulcer disease producing high-grade partial gastric outlet obstruction. Helicobacter pylori serologies negative  2 years ago.  Clinically, no further significant bleeding.  Recommendations:  Stop Diflucan. Continue twice a day PPI. Continue nothing by mouth for now. In 24-48 hours may consider trial of clear liquids as some of the encroachment on the pyloric channel may be secondary to edema which may improve over the short run.  Would continue to plan for repeat EGD on December 26 to reassess upper GI tract and dilate pyloric channel as feasible/appropriate.

## 2015-05-08 NOTE — Progress Notes (Signed)
PROGRESS NOTE  Edward Trevino [id number] DOB: 21-Jun-1931 DOA: 05/03/2015 PCP: Deloria Lair, MD  Summary: 67 yomwith ischemic cardiomyopathy and ejection fraction of 20% presented to the hospital with chest pain. Had mild elevation of troponins without any clear evidence of ACS. Seen by cardiology who felt that his chest pain may be related to angina and recommended medical management. Developed hematemesis and EGD revealed esophagitis as well as PUD and GOO. NPO for now, GI plans to advance diet 1-2 days and EGD 12/26 to dilate GOO.  Assessment/Plan: 1. Hematemesis/UGIB secondary to secondary to severe reflux esophagitis. Hgb stable. No recurrent episodes. Management per GI.  2. Peptic ulcer disease producing high-grade partial gastric outlet obstruction.  3. ABLA, suspect no stable. No evidence of ongoing bleeding. 4. CP, suspected angina, with associated SOB, abd pain, n/v on admission. No further pain.  EKG consistent with prior anterior infarct per cardiology.  ASA and Plavix on hold. Resume ASA when ok with GI. Do not resume Plavix.  5. Elevated troponin. Troponins flat, not consistent with ACS. EKG nonacute.  6. Orthostatic hypotension.  Lisinopril stopped. Coreg decreased to 3.125 BID. Consider Midodrin or compression hose if needed 7. AKI, resolved with IVF.  8. Chronic systolic CHF, stable. Patient NPO, will start IVF per cardiology.  9. CAD, anterior STEMI 02/2015 managed conservatively 10. Ischemic cardiomyopathy, chronic systolic CHF, LVEF Q000111Q. Titration of meds limited by low BP. 11. Hyperlipidemia, continue statin.   Overall stable. Continue fluids and IV medications  Follow up CBC in AM  Plan per GI. EGD in 3 days with hopeful resolution of GOO.   Code Status: Full DVT prophylaxis: Lovenox Family Communication:No family at bedside. Disposition Plan: Anticipate discharge in 1-2 days.   Murray Hodgkins, MD  [facility]  Pager [phone] If 7PM-7AM,  please contact night-coverage at [email or web address], password [facility] 05/08/2015, 7:26 AM  LOS: 3 days   Consultants:  Cardiology  GI  PT- No Follow Up  Procedures:    Antibiotics:    HPI/Subjective: Feels tired, depressed and weak. Denies any bleeding, vomiting, or pain. Breathing ok.  Objective: Filed Vitals:   05/07/15 1415 05/07/15 1856 05/07/15 2203 05/08/15 0709  BP: 126/83 106/66 123/66 102/60  Pulse: 78 67 71 64  Temp:  97.1 F (36.2 C) 97.8 F (36.6 C) 97.8 F (36.6 C)  TempSrc:  Oral Oral Oral  Resp: 17 16 18 18   Height:      Weight:      SpO2: 100% 99% 100% 97%    Intake/Output Summary (Last 24 hours) at 05/08/15 0726 Last data filed at 05/07/15 1417  Gross per 24 hour  Intake    700 ml  Output    250 ml  Net    450 ml     Filed Weights   05/03/15 1717 05/03/15 2305  Weight: 81.647 kg (180 lb) 82.555 kg (182 lb)    Exam:    VSS, afebrile. Not hypoxic  General:  Appears calm and comfortable Cardiovascular: RRR, no m/r/g. No LE edema. Telemetry: SR, no arrhythmias  Respiratory: CTA bilaterally, no w/r/r. Normal respiratory effort. Abdomen: soft, ntnd Skin: no rash or induration seen on limited exam Musculoskeletal: grossly normal tone BUE/BLE Psychiatric: grossly normal mood and affect, speech fluent and appropriate Neurologic: grossly non-focal.    New data reviewed:  CMP unremarkable, LFTs improved  Hgb slightly lower, 10.7  Scheduled Meds: . fluconazole (DIFLUCAN) IV  100 mg Intravenous Q24H  . metoprolol  5 mg Intravenous 3 times per  day  . nitroGLYCERIN  0.4 mg Transdermal Daily  . pantoprazole (PROTONIX) IV  40 mg Intravenous Q12H   Continuous Infusions:    Principal Problem:   Hematemesis with nausea Active Problems:   Erosive esophagitis   Chest pain with moderate risk of acute coronary syndrome   Orthostatic hypotension   Cardiomyopathy, ischemic-EF 123456   Chronic systolic CHF (congestive heart failure) (HCC)   History  of STEMI-Oct 2016- conservative Rx   Dyslipidemia   Atrial fibrillation, new onset (HCC)   Emesis   Loss of weight   Poor appetite   Gastric outlet obstruction   Time spent 25 minutes  By signing my name below, I, Rosalie Doctor attest that this documentation has been prepared under the direction and in the presence of Murray Hodgkins, MD Electronically signed: Rosalie Doctor, Scribe.  05/08/2015 1:27pm  I personally performed the services described in this documentation. All medical record entries made by the scribe were at my direction. I have reviewed the chart and agree that the record reflects my personal performance and is accurate and complete. Murray Hodgkins, MD

## 2015-05-08 NOTE — Care Management Important Message (Signed)
Important Message  Patient Details  Name: Edward Trevino MRN: [id number] Date of Birth: 1932/05/05   Medicare Important Message Given:  Yes    Sherald Barge, RN 05/08/2015, 2:06 PM

## 2015-05-09 LAB — CBC
HCT: 32.3 % — ABNORMAL LOW (ref 39.0–52.0)
Hemoglobin: 10.9 g/dL — ABNORMAL LOW (ref 13.0–17.0)
MCH: 31.8 pg (ref 26.0–34.0)
MCHC: 33.7 g/dL (ref 30.0–36.0)
MCV: 94.2 fL (ref 78.0–100.0)
Platelets: 211 10*3/uL (ref 150–400)
RBC: 3.43 MIL/uL — AB (ref 4.22–5.81)
RDW: 12.6 % (ref 11.5–15.5)
WBC: 7 10*3/uL (ref 4.0–10.5)

## 2015-05-09 MED ORDER — ZOLPIDEM TARTRATE 5 MG PO TABS
5.0000 mg | ORAL_TABLET | Freq: Every evening | ORAL | Status: DC | PRN
  Administered 2015-05-09 – 2015-05-14 (×5): 5 mg via ORAL
  Filled 2015-05-09 (×6): qty 1

## 2015-05-09 NOTE — Progress Notes (Signed)
Patient tolerating sips of clear liquids. Had BM earlier today according to nursing staff-no obvious melena or red blood. No vomiting. Patient denies abdominal pain.    Vital signs in last 24 hours: Temp:  [97.4 F (36.3 C)-98.2 F (36.8 C)] 97.7 F (36.5 C) (12/24 1417) Pulse Rate:  [64-66] 64 (12/24 1417) Resp:  [18] 18 (12/24 1417) BP: (106-134)/(54-69) 134/60 mmHg (12/24 1417) SpO2:  [95 %-100 %] 99 % (12/24 1417) Last BM Date: 05/09/15 General:   Elderly somewhat disheveled pleasant and cooperative in NAD Abdomen:  Soft, nontender and nondistended.  Normal bowel sounds, without guarding, and without rebound.  No mass or organomegaly. Extremities:  Without clubbing or edema.    Intake/Output from previous day: 12/23 0701 - 12/24 0700 In: 815.8 [I.V.:815.8] Out: 450 [Urine:450] Intake/Output this shift: Total I/O In: 398.3 [I.V.:398.3] Out: 301 [Urine:300; Stool:1]  Lab Results:  Recent Labs  05/07/15 0231 05/08/15 0638 05/09/15 1033  WBC 14.7* 8.0 7.0  HGB 12.7* 10.7* 10.9*  HCT 37.1* 32.3* 32.3*  PLT 209 191 211   BMET  Recent Labs  05/08/15 0638  NA 139  K 3.9  CL 109  CO2 26  GLUCOSE 94  BUN 19  CREATININE 1.00  CALCIUM 8.2*   LFT  Recent Labs  05/07/15 1045 05/08/15 0638  PROT 6.2* 5.6*  ALBUMIN 3.1* 2.8*  AST 24 23  ALT 22 20  ALKPHOS 202* 185*  BILITOT 0.8 0.6  BILIDIR 0.2  --   IBILI 0.6  --    PT/INR No results for input(s): LABPROT, INR in the last 72 hours. Hepatitis Panel No results for input(s): HEPBSAG, HCVAB, HEPAIGM, HEPBIGM in the last 72 hours. C-Diff No results for input(s): CDIFFTOX in the last 72 hours.  Studies/Results: No results found.  Assessment: Principal Problem:   Hematemesis with nausea Active Problems:   Erosive esophagitis   Chest pain with moderate risk of acute coronary syndrome   Orthostatic hypotension   Cardiomyopathy, ischemic-EF 123456   Chronic systolic CHF (congestive heart failure) (HCC)  History of STEMI-Oct 2016- conservative Rx   Dyslipidemia   Atrial fibrillation, new onset (HCC)   Emesis   Loss of weight   Poor appetite   Gastric outlet obstruction   Acute esophagitis   Gastric outflow obstruction   Peptic ulcer  LOS: 4 days    Impression:  81 year old gentleman with upper GI bleed secondary to severe reflux esophagitis/ peptic ulcer disease/high-grade GOO.  Clinically, bleeding seems to have ceased.  Tolerating sips of clear liquids  Recommendations:   Clear liquid diet. We'll plan for repeat EGD on December 26 (Plavix held for 5 days by that time) by Dr. Laural Golden to perform complete EGD and assess gastric outlet. Possible dilation as feasible/appropriate.      Manus Rudd  05/09/2015, 3:27 PM

## 2015-05-09 NOTE — Progress Notes (Signed)
[facility] PROGRESS NOTE  Edward Trevino [id number] DOB: 02-26-1932 DOA: 05/03/2015 PCP: Deloria Lair, MD  Assessment/Plan: 1. Hematemesis/UGIB secondary to severe reflux esophagitis. Hgb stable. No recurrent episodes. Management per GI. Continue PPI. 2. Peptic ulcer disease producing high-grade partial gastric outlet obstruction. Plan for EGD 12/26 per GI with possible dilation. Continue NPO for now.  3. ABLA, stable. No evidence of ongoing bleeding. 4. CP, suspected angina, with associated SOB, abd pain, n/v on admission. No further pain. EKG consistent with prior anterior infarct per cardiology. ASA and Plavix on hold. Resume ASA when ok with GI. Do not resume Plavix.  5. Elevated troponin. Troponins flat, not consistent with ACS. EKG nonacute.  6. Orthostatic hypotension. Lisinopril stopped. Coreg decreased to 3.125 BID. Consider Midodrin or compression hose if needed. Appears to be improving with IVF. 7. AKI, resolved with IVF.  8. Chronic systolic CHF, stable. Continue gentle hydration while NPO.  9. CAD, anterior STEMI 02/2015 managed conservatively 10. Ischemic cardiomyopathy, chronic systolic CHF, LVEF Q000111Q. Titration of meds limited by low BP. 11. Hyperlipidemia, continue statin.   Code Status: Full DVT prophylaxis: Lovenox Family Communication: No family at bedside.  Disposition Plan: Discharge when workup complete.    Consultants:  Cardiology  GI  PT- No Follow Up  Procedures:  none  Antibiotics:  None   HPI/Subjective: Feels okay. Denies any nausea or vomiting. No hematemesis.   Objective: Filed Vitals:   05/08/15 2135 05/09/15 0555  BP: 125/69 106/54  Pulse: 66 64  Temp: 98.2 F (36.8 C) 97.4 F (36.3 C)  Resp:      Intake/Output Summary (Last 24 hours) at 05/09/15 0812 Last data filed at 05/09/15 [id number]  Gross per 24 hour  Intake 815.83 ml  Output    450 ml  Net 365.83 ml   Filed Weights   05/03/15 1717 05/03/15 2305   Weight: 81.647 kg (180 lb) 82.555 kg (182 lb)    Exam:  General: NAD, looks comfortable Cardiovascular: RRR, S1, S2  Respiratory: clear bilaterally, No wheezing, rales or rhonchi Abdomen: soft, non tender, no distention , bowel sounds normal Musculoskeletal: No edema b/l  Data Reviewed: Basic Metabolic Panel:  Recent Labs Lab 05/03/15 1743 05/05/15 0541 05/06/15 0610 05/08/15 0638  NA 145 139 141 139  K 3.8 3.8 3.7 3.9  CL 101 107 104 109  CO2 31 26 29 26   GLUCOSE 124* 116* 127* 94  BUN 19 23* 16 19  CREATININE 1.33* 1.13 0.96 1.00  CALCIUM 9.5 8.1* 8.3* 8.2*  MG  --   --   --  2.0   Liver Function Tests:  Recent Labs Lab 05/03/15 1743 05/07/15 1045 05/08/15 0638  AST 31 24 23   ALT 24 22 20   ALKPHOS 270* 202* 185*  BILITOT 0.9 0.8 0.6  PROT 7.7 6.2* 5.6*  ALBUMIN 4.3 3.1* 2.8*    Recent Labs Lab 05/03/15 1743  LIPASE 25   CBC:  Recent Labs Lab 05/03/15 1743 05/05/15 0541 05/06/15 0610 05/07/15 0231 05/08/15 0638  WBC 15.1* 12.5* 11.3* 14.7* 8.0  NEUTROABS 12.3*  --   --  11.7*  --   HGB 15.2 11.7* 12.8* 12.7* 10.7*  HCT 44.2 35.3* 38.1* 37.1* 32.3*  MCV 94.2 95.1 94.5 93.9 95.6  PLT 238 180 179 209 191   Cardiac Enzymes:  Recent Labs Lab 05/03/15 1743 05/04/15 0042 05/04/15 0335  TROPONINI 0.04* 0.05* 0.04*   BNP (last 3 results)  Recent Labs  02/28/15 1430  BNP 531.1*  Recent Results (from the past 240 hour(s))  KOH prep     Status: None   Collection Time: 05/07/15  2:10 PM  Result Value Ref Range Status   Specimen Description ESOPHAGUS BRUSHING  Final   Special Requests NONE  Final   KOH Prep   Final    NO YEAST OR FUNGAL ELEMENTS SEEN Performed at [facility]    Report Status 05/07/2015 FINAL  Final      Scheduled Meds: . metoprolol  5 mg Intravenous 3 times per day  . nitroGLYCERIN  0.4 mg Transdermal Daily  . pantoprazole (PROTONIX) IV  40 mg Intravenous Q12H   Continuous Infusions: . sodium  chloride 50 mL/hr (05/09/15 0412)    Principal Problem:   Hematemesis with nausea Active Problems:   Erosive esophagitis   Chest pain with moderate risk of acute coronary syndrome   Orthostatic hypotension   Cardiomyopathy, ischemic-EF 123456   Chronic systolic CHF (congestive heart failure) (HCC)   History of STEMI-Oct 2016- conservative Rx   Dyslipidemia   Atrial fibrillation, new onset (HCC)   Emesis   Loss of weight   Poor appetite   Gastric outlet obstruction   Acute esophagitis   Gastric outflow obstruction   Peptic ulcer    Time spent: 25 minutes   Kaelen Brennan. MD  [facility] Pager [phone]. If 7PM-7AM, please contact night-coverage at [email or web address], password [facility] 05/09/2015, 8:12 AM  LOS: 4 days     By signing my name below, I, Rosalie Doctor, attest that this documentation has been prepared under the direction and in the presence of [employer]. MD Electronically Signed: Rosalie Doctor, Scribe. 05/09/2015 9:01pm  I, Dr. Kathie Dike, personally performed the services described in this documentaiton. All medical record entries made by the scribe were at my direction and in my presence. I have reviewed the chart and agree that the record reflects my personal performance and is accurate and complete  Kathie Dike, MD, 05/09/2015 9:07 AM

## 2015-05-10 LAB — CBC
HEMATOCRIT: 31.2 % — AB (ref 39.0–52.0)
HEMOGLOBIN: 10.6 g/dL — AB (ref 13.0–17.0)
MCH: 31.5 pg (ref 26.0–34.0)
MCHC: 34 g/dL (ref 30.0–36.0)
MCV: 92.6 fL (ref 78.0–100.0)
Platelets: 204 10*3/uL (ref 150–400)
RBC: 3.37 MIL/uL — ABNORMAL LOW (ref 4.22–5.81)
RDW: 12.5 % (ref 11.5–15.5)
WBC: 6.5 10*3/uL (ref 4.0–10.5)

## 2015-05-10 MED ORDER — SODIUM CHLORIDE 0.9 % IV SOLN: INTRAVENOUS | Status: DC

## 2015-05-10 NOTE — Progress Notes (Signed)
Patient without complaints. He is tolerating a clear liquid diet. He has not had any nausea or vomiting. He had 1 loose dark bowel movement overnight.  Hemoglobin actually up a bit to 10.9  Vital signs in last 24 hours: Temp:  [97.7 F (36.5 C)-98.4 F (36.9 C)] 98.2 F (36.8 C) (12/25 0546) Pulse Rate:  [63-64] 64 (12/25 0546) Resp:  [18] 18 (12/25 0546) BP: (110-134)/(56-70) 110/56 mmHg (12/25 0546) SpO2:  [99 %-100 %] 99 % (12/25 0546) Last BM Date: 05/10/15 General:   Somewhat disheveled; pleasant and cooperative in NAD;  Accompanied by his son. Abdomen:  Flat. Positive bowel sounds soft and nontender  Extremities:  Without clubbing or edema.    Intake/Output from previous day: 12/24 0701 - 12/25 0700 In: 878.3 [P.O.:480; I.V.:398.3] Out: 301 [Urine:300; Stool:1] Intake/Output this shift:    Lab Results:  Recent Labs  05/08/15 0638 05/09/15 1033  WBC 8.0 7.0  HGB 10.7* 10.9*  HCT 32.3* 32.3*  PLT 191 211   BMET  Recent Labs  05/08/15 0638  NA 139  K 3.9  CL 109  CO2 26  GLUCOSE 94  BUN 19  CREATININE 1.00  CALCIUM 8.2*   Impression:   Stable upper GI bleed secondary to severe reflux esophagitis, peptic ulcer disease with high-grade partial gastric outlet obstruction. Recent STEMI with residual cardiomyopathy He will have been off Plavix and aspirin 5 days as of tomorrow. Earlier EGD incomplete  Recommendations:  As recommended to patient and  patient's son, he should have a repeat EGD tomorrow. Hopefully, we'll get a complete look at his upper GI tract and dilate pyloric channel as needed.  The risks benefits alternatives have been reviewed with patient and family. Furthermore, he understand Dr. Laural Golden will perform in my absence. As recommended by cardiology, discontinue Plavix indefinitely, eventually resume daily aspirin only

## 2015-05-10 NOTE — Progress Notes (Signed)
[facility] PROGRESS NOTE  Edward Trevino [id number] DOB: [birth year] DOA: 05/03/2015 PCP: Deloria Lair, MD  Assessment/Plan: 1. Hematemesis/UGIB secondary to severe reflux esophagitis. Hgb remains stable. No recurrent episodes. Management per GI. Continue PPI. 2. Peptic ulcer disease producing high-grade partial gastric outlet obstruction. Plans remain for EGD 12/26 per GI with possible dilation. Per GI can advance to clear liquid diet. 3. ABLA, remains stable. No evidence of ongoing bleeding. 4. CP, suspected angina, with associated SOB, abd pain, n/v on admission. No further pain. EKG consistent with prior anterior infarct per cardiology. ASA and Plavix on hold. Resume ASA when ok with GI. Do not resume Plavix.  5. Elevated troponin. Troponins flat, not consistent with ACS. EKG nonacute.  6. Orthostatic hypotension. Lisinopril stopped. Coreg decreased to 3.125 BID. Consider Midodrin or compression hose if needed. Appears to be improving with IVF. 7. AKI, resolved with IVF.  8. Chronic systolic CHF, stable. Continue gentle hydration while NPO.  9. CAD, anterior STEMI 02/2015 managed conservatively 10. Ischemic cardiomyopathy, chronic systolic CHF, LVEF Q000111Q. Titration of meds limited by low BP. 11. Hyperlipidemia, continue statin.   Code Status: Full DVT prophylaxis: Lovenox Family Communication: Son bedside. Discussed with patient who understands and has no concerns at this time.  Disposition Plan: Anticipate discharge within 1-2 days.    Consultants:  Cardiology  GI  PT- No Follow Up  Procedures:  none  Antibiotics:  None   HPI/Subjective: Breathing is doing better and reports having a good bowel movement this morning. Denies any bowel movement yesterday and any nausea.   Objective: Filed Vitals:   05/09/15 2113 05/10/15 0546  BP: 122/70 110/56  Pulse: 63 64  Temp: 98.4 F (36.9 C) 98.2 F (36.8 C)  Resp: 18 18    Intake/Output Summary (Last  24 hours) at 05/10/15 0731 Last data filed at 05/09/15 1900  Gross per 24 hour  Intake 878.33 ml  Output    301 ml  Net 577.33 ml   Filed Weights   05/03/15 1717 05/03/15 2305  Weight: 81.647 kg (180 lb) 82.555 kg (182 lb)    Exam:  General: NAD. Appears calm and looks comfortable lying in bed.  Cardiovascular: RRR, S1, S2  Respiratory: clear bilaterally, No wheezing, rales or rhonchi Abdomen: soft, non tender, no distention , bowel sounds normal Musculoskeletal: No edema b/l  Data Reviewed: Basic Metabolic Panel:  Recent Labs Lab 05/03/15 1743 05/05/15 0541 05/06/15 0610 05/08/15 0638  NA 145 139 141 139  K 3.8 3.8 3.7 3.9  CL 101 107 104 109  CO2 31 26 29 26   GLUCOSE 124* 116* 127* 94  BUN 19 23* 16 19  CREATININE 1.33* 1.13 0.96 1.00  CALCIUM 9.5 8.1* 8.3* 8.2*  MG  --   --   --  2.0   Liver Function Tests:  Recent Labs Lab 05/03/15 1743 05/07/15 1045 05/08/15 0638  AST 31 24 23   ALT 24 22 20   ALKPHOS 270* 202* 185*  BILITOT 0.9 0.8 0.6  PROT 7.7 6.2* 5.6*  ALBUMIN 4.3 3.1* 2.8*    Recent Labs Lab 05/03/15 1743  LIPASE 25   CBC:  Recent Labs Lab 05/03/15 1743 05/05/15 0541 05/06/15 0610 05/07/15 0231 05/08/15 0638 05/09/15 1033  WBC 15.1* 12.5* 11.3* 14.7* 8.0 7.0  NEUTROABS 12.3*  --   --  11.7*  --   --   HGB 15.2 11.7* 12.8* 12.7* 10.7* 10.9*  HCT 44.2 35.3* 38.1* 37.1* 32.3* 32.3*  MCV 94.2 95.1 94.5  93.9 95.6 94.2  PLT 238 180 179 209 191 211   Cardiac Enzymes:  Recent Labs Lab 05/03/15 1743 05/04/15 0042 05/04/15 0335  TROPONINI 0.04* 0.05* 0.04*   BNP (last 3 results)  Recent Labs  02/28/15 1430  BNP 531.1*     Recent Results (from the past 240 hour(s))  KOH prep     Status: None   Collection Time: 05/07/15  2:10 PM  Result Value Ref Range Status   Specimen Description ESOPHAGUS BRUSHING  Final   Special Requests NONE  Final   KOH Prep   Final    NO YEAST OR FUNGAL ELEMENTS SEEN Performed at [facility]    Report Status 05/07/2015 FINAL  Final      Scheduled Meds: . metoprolol  5 mg Intravenous 3 times per day  . nitroGLYCERIN  0.4 mg Transdermal Daily  . pantoprazole (PROTONIX) IV  40 mg Intravenous Q12H   Continuous Infusions: . sodium chloride 50 mL/hr (05/09/15 2115)    Principal Problem:   Hematemesis with nausea Active Problems:   Erosive esophagitis   Chest pain with moderate risk of acute coronary syndrome   Orthostatic hypotension   Cardiomyopathy, ischemic-EF 123456   Chronic systolic CHF (congestive heart failure) (HCC)   History of STEMI-Oct 2016- conservative Rx   Dyslipidemia   Atrial fibrillation, new onset (HCC)   Emesis   Loss of weight   Poor appetite   Gastric outlet obstruction   Acute esophagitis   Gastric outflow obstruction   Peptic ulcer    Time spent: 25 minutes   Jehanzeb Memon. MD  [facility] Pager [phone]. If 7PM-7AM, please contact night-coverage at [email or web address], password [facility] 05/10/2015, 7:31 AM  LOS: 5 days      By signing my name below, I, Rennis Harding, attest that this documentation has been prepared under the direction and in the presence of Kathie Dike, MD. Electronically signed: Rennis Harding, Scribe. 05/10/2015 10:27am.  I, Dr. Kathie Dike, personally performed the services described in this documentaiton. All medical record entries made by the scribe were at my direction and in my presence. I have reviewed the chart and agree that the record reflects my personal performance and is accurate and complete  Kathie Dike, MD, 05/10/2015 10:40 AM

## 2015-05-11 ENCOUNTER — Encounter (HOSPITAL_COMMUNITY)
Admission: EM | Disposition: A | Discharge status: Home / Self Care | Payer: Self-pay | Source: Home / Self Care | Attending: Internal Medicine

## 2015-05-11 DIAGNOSIS — K2210 Ulcer of esophagus without bleeding: Secondary | ICD-10-CM

## 2015-05-11 DIAGNOSIS — K222 Esophageal obstruction: Secondary | ICD-10-CM

## 2015-05-11 DIAGNOSIS — K922 Gastrointestinal hemorrhage, unspecified: Secondary | ICD-10-CM

## 2015-05-11 DIAGNOSIS — K269 Duodenal ulcer, unspecified as acute or chronic, without hemorrhage or perforation: Secondary | ICD-10-CM

## 2015-05-11 DIAGNOSIS — K2980 Duodenitis without bleeding: Secondary | ICD-10-CM

## 2015-05-11 DIAGNOSIS — K315 Obstruction of duodenum: Secondary | ICD-10-CM

## 2015-05-11 DIAGNOSIS — K208 Other esophagitis: Secondary | ICD-10-CM

## 2015-05-11 HISTORY — DX: Duodenitis without bleeding: K29.80

## 2015-05-11 HISTORY — DX: Duodenal ulcer, unspecified as acute or chronic, without hemorrhage or perforation: K26.9

## 2015-05-11 HISTORY — PX: ESOPHAGOGASTRODUODENOSCOPY: SHX5428

## 2015-05-11 HISTORY — DX: Esophageal obstruction: K22.2

## 2015-05-11 SURGERY — EGD (ESOPHAGOGASTRODUODENOSCOPY)
Anesthesia: Moderate Sedation

## 2015-05-11 MED ORDER — STERILE WATER FOR IRRIGATION IR SOLN
Status: DC | PRN
  Administered 2015-05-11: 09:00:00

## 2015-05-11 MED ORDER — BUTAMBEN-TETRACAINE-BENZOCAINE 2-2-14 % EX AERO
INHALATION_SPRAY | CUTANEOUS | Status: DC | PRN
  Administered 2015-05-11: 2 via TOPICAL

## 2015-05-11 MED ORDER — NALOXONE HCL 0.4 MG/ML IJ SOLN
INTRAMUSCULAR | Status: AC
  Filled 2015-05-11: qty 1

## 2015-05-11 MED ORDER — MEPERIDINE HCL 50 MG/ML IJ SOLN
INTRAMUSCULAR | Status: AC
  Filled 2015-05-11: qty 1

## 2015-05-11 MED ORDER — MIDAZOLAM HCL 5 MG/5ML IJ SOLN
INTRAMUSCULAR | Status: AC
  Filled 2015-05-11: qty 5

## 2015-05-11 MED ORDER — BUTAMBEN-TETRACAINE-BENZOCAINE 2-2-14 % EX AERO
INHALATION_SPRAY | CUTANEOUS | Status: AC
  Filled 2015-05-11: qty 20

## 2015-05-11 MED ORDER — EPINEPHRINE HCL 0.1 MG/ML IJ SOSY
PREFILLED_SYRINGE | INTRAMUSCULAR | Status: AC
  Filled 2015-05-11: qty 10

## 2015-05-11 MED ORDER — FLUMAZENIL 0.5 MG/5ML IV SOLN
INTRAVENOUS | Status: AC
  Filled 2015-05-11: qty 5

## 2015-05-11 MED ORDER — MEPERIDINE HCL 50 MG/ML IJ SOLN
INTRAMUSCULAR | Status: DC | PRN
  Administered 2015-05-11: 25 mg via INTRAVENOUS

## 2015-05-11 MED ORDER — MIDAZOLAM HCL 5 MG/5ML IJ SOLN
INTRAMUSCULAR | Status: DC | PRN
  Administered 2015-05-11 (×3): 1 mg via INTRAVENOUS

## 2015-05-11 MED ORDER — ATROPINE SULFATE 1 MG/ML IJ SOLN
INTRAMUSCULAR | Status: AC
  Filled 2015-05-11: qty 1

## 2015-05-11 NOTE — Progress Notes (Signed)
Attempted x 3 times to obtain IV access without success. Charge nurse notified & endo nurse notified and made aware. MD notified.

## 2015-05-11 NOTE — Progress Notes (Signed)
[facility] PROGRESS NOTE  CALLISTER KIESCHNICK [id number] DOB: 02/11/1932 DOA: 05/03/2015 PCP: Deloria Lair, MD  Assessment/Plan: 1. Hematemesis/UGIB secondary to severe reflux esophagitis. Hgb remains stable. No recurrent episodes. Management per GI. Continue PPI. 2. Peptic ulcer disease producing high-grade partial gastric outlet obstruction. Repeat EGD 12/26, due to findings GI has recommended he stays on full liquid diet with reevaluation in the morning to determine if gastric emptying study is required. Continue PPI.   3. ABLA, remains stable. No evidence of ongoing bleeding. 4. CP, suspected angina, with associated SOB, abd pain, n/v on admission. No further pain. EKG consistent with prior anterior infarct per cardiology. ASA and Plavix on hold. Resume ASA when ok with GI. Do not resume Plavix.  5. Elevated troponin. Troponins flat, not consistent with ACS. EKG nonacute.  6. Orthostatic hypotension. Lisinopril stopped. Coreg decreased to 3.125 BID. Consider Midodrin or compression hose if needed. Appears to be improving with IVF. 7. AKI, resolved with IVF.  8. Chronic systolic CHF, appears compensated.   9. CAD, anterior STEMI 02/2015 managed conservatively 10. Ischemic cardiomyopathy, chronic systolic CHF, LVEF Q000111Q. Titration of meds limited by low BP. 11. Hyperlipidemia, continue statin.   Code Status: Full DVT prophylaxis: Lovenox Family Communication: Son bedside. Discussed with patient who understands and has no concerns at this time.  Disposition Plan: Anticipate discharge within 1-2 days.    Consultants:  Cardiology  GI  PT- No Follow Up  Procedures:  none  Antibiotics:  None   HPI/Subjective: Doing well EGD was without issues/   Objective: Filed Vitals:   05/11/15 1030 05/11/15 1035  BP: 116/63   Pulse: 61 65  Temp:    Resp: 13 17    Intake/Output Summary (Last 24 hours) at 05/11/15 1144 Last data filed at 05/10/15 1900  Gross per 24  hour  Intake    240 ml  Output      0 ml  Net    240 ml   Filed Weights   05/03/15 1717 05/03/15 2305  Weight: 81.647 kg (180 lb) 82.555 kg (182 lb)    Exam:  General: NAD. Appears calm and looks comfortable sitting in room chair.  Cardiovascular: Regular rate and rhythm  Respiratory: CTAB. No wheezing, rales or rhonchi. Speaks in full sentences.  Abdomen: soft, non tender, no distention , bowel sounds normal Musculoskeletal: No edema b/l  Data Reviewed: Basic Metabolic Panel:  Recent Labs Lab 05/05/15 0541 05/06/15 0610 05/08/15 0638  NA 139 141 139  K 3.8 3.7 3.9  CL 107 104 109  CO2 26 29 26   GLUCOSE 116* 127* 94  BUN 23* 16 19  CREATININE 1.13 0.96 1.00  CALCIUM 8.1* 8.3* 8.2*  MG  --   --  2.0   Liver Function Tests:  Recent Labs Lab 05/07/15 1045 05/08/15 0638  AST 24 23  ALT 22 20  ALKPHOS 202* 185*  BILITOT 0.8 0.6  PROT 6.2* 5.6*  ALBUMIN 3.1* 2.8*   No results for input(s): LIPASE, AMYLASE in the last 168 hours. CBC:  Recent Labs Lab 05/06/15 0610 05/07/15 0231 05/08/15 0638 05/09/15 1033 05/10/15 1105  WBC 11.3* 14.7* 8.0 7.0 6.5  NEUTROABS  --  11.7*  --   --   --   HGB 12.8* 12.7* 10.7* 10.9* 10.6*  HCT 38.1* 37.1* 32.3* 32.3* 31.2*  MCV 94.5 93.9 95.6 94.2 92.6  PLT 179 209 191 211 204   Cardiac Enzymes: No results for input(s): CKTOTAL, CKMB, CKMBINDEX, TROPONINI in the  last 168 hours. BNP (last 3 results)  Recent Labs  02/28/15 1430  BNP 531.1*     Recent Results (from the past 240 hour(s))  KOH prep     Status: None   Collection Time: 05/07/15  2:10 PM  Result Value Ref Range Status   Specimen Description ESOPHAGUS BRUSHING  Final   Special Requests NONE  Final   KOH Prep   Final    NO YEAST OR FUNGAL ELEMENTS SEEN Performed at [facility]    Report Status 05/07/2015 FINAL  Final      Scheduled Meds: . atropine      . butamben-tetracaine-benzocaine      . EPINEPHrine      . flumazenil      .  meperidine      . metoprolol  5 mg Intravenous 3 times per day  . midazolam      . naloxone      . nitroGLYCERIN  0.4 mg Transdermal Daily  . pantoprazole (PROTONIX) IV  40 mg Intravenous Q12H   Continuous Infusions: . sodium chloride 50 mL/hr at 05/11/15 1132    Principal Problem:   Hematemesis with nausea Active Problems:   Erosive esophagitis   Chest pain with moderate risk of acute coronary syndrome   Orthostatic hypotension   Cardiomyopathy, ischemic-EF 123456   Chronic systolic CHF (congestive heart failure) (HCC)   History of STEMI-Oct 2016- conservative Rx   Dyslipidemia   Atrial fibrillation, new onset (HCC)   Emesis   Loss of weight   Poor appetite   Gastric outlet obstruction   Acute esophagitis   Gastric outflow obstruction   Peptic ulcer    Time spent: 25 minutes   Jehanzeb Memon. MD  [facility] Pager [phone]. If 7PM-7AM, please contact night-coverage at [email or web address], password [facility] 05/11/2015, 11:44 AM  LOS: 6 days      By signing my name below, I, Rennis Harding, attest that this documentation has been prepared under the direction and in the presence of Kathie Dike, MD. Electronically signed: Rennis Harding, Scribe. 05/11/2015 11:44am   I, Dr. Kathie Dike, personally performed the services described in this documentaiton. All medical record entries made by the scribe were at my direction and in my presence. I have reviewed the chart and agree that the record reflects my personal performance and is accurate and complete  Kathie Dike, MD, 05/11/2015 11:48 AM

## 2015-05-11 NOTE — Op Note (Signed)
EGD PROCEDURE REPORT  PATIENT:  Edward Trevino  MR#:  [id number] Birthdate:  11-12-1931, 81 y.o., male   Endoscopist:  Dr. Rogene Houston, MD  Procedure Date: 05/11/2015  Procedure:   EGD  Indications:  Patient is 81 year old Caucasian male with multiple medical problems who was admitted last week with upper GI bleed. He underwent EGD by Dr. Gala Romney 4 days ago revealing extensive changes of reflux esophagitis as well as duodenitis with stricture as well as ulcer. Examination was incomplete. Patient is off Plavix and aspirin and is on IV PPI and tolerating clear liquids. He is returning for repeat evaluation of upper GI tract with lancet dilated duodenal stricture indicated.            Informed Consent:  The risks, benefits, alternatives & imponderables which include, but are not limited to, bleeding, infection, perforation, drug reaction and potential missed lesion have been reviewed.  The potential for biopsy, lesion removal, esophageal dilation, etc. have also been discussed.  Questions have been answered.  All parties agreeable.  Please see history & physical in medical record for more information.  Medications:  Demerol 25 mg IV Versed 3 mg IV Cetacaine spray topically for oropharyngeal anesthesia  Description of procedure:  The endoscope was introduced through the mouth and advanced to the second portion of the duodenum without difficulty or limitations. The mucosal surfaces were surveyed very carefully during advancement of the scope and upon withdrawal.  Findings:  Esophagus:  Mucosa of the proximal half is normal. Distal half of mucosa reveal linear ulcers coalescing to become circumferential ulceration in the distal 3-4 cm. Soft stricture noted at GE junction. It was dilated with the scope. GEJ:  43 cm Hiatus:  45 cm Stomach:  Very large stomach containing one piece of food debris. Folds in the proximal stomach were normal. Mucosa at gastric body and antrum was normal. Pyloric channel  was patent. Angularis fundus and cardia were normal. Duodenum:  There was diffuse erythema and edema to bulbar mucosa with two small ulcers in the distal segment and high-grade stricture in the middle. I was not able to pass the scope across the stricture.  Therapeutic/Diagnostic Maneuvers Performed:   Duodenal stricture was dilated with balloon dilator. Balloon dilator was advanced through the scope and across the stricture under direct vision. Balloon was insufflated to a diameter of 10 mm and maintained for a few seconds and then diameter increase 12 mm and maintained for 2 minutes. Balloon.was withdrawn. Minimal bleeding noted from stricture post dilation. I was still unable to pass the scope across it because all of the scope was used up in the stomach. Single piece of food debris was removed from the stomach using Roth net.  Complications: None  EBL: minimal  Impression: Extensive ulceration to distal half of esophagus along with stricture at GE junction which was dilated with the scope. Small sliding hiatal hernia. Large stomach with single piece of food debris which was removed using Roth net. Deformity to duodenal bulb with duodenitis and 2 small ulcers and high-grade stricture. This stricture was dilated with balloon dilator to 12 mm. Scope could not be advanced distally because of very large stomach.  Recommendations:  Continue IV pantoprazole at 40 mg every 12 hours. Advance diet to full liquids. Will reevaluate patient tomorrow morning and determine if he would benefit from gastric emptying study.   Markes Shatswell U  05/11/2015  10:29 AM  CC: Dr. Deloria Lair, MD & Dr. Rayne Du ref. provider found

## 2015-05-12 ENCOUNTER — Ambulatory Visit: Payer: Medicare Other | Admitting: Internal Medicine

## 2015-05-12 LAB — CBC
HEMATOCRIT: 31.6 % — AB (ref 39.0–52.0)
Hemoglobin: 11 g/dL — ABNORMAL LOW (ref 13.0–17.0)
MCH: 32.3 pg (ref 26.0–34.0)
MCHC: 34.8 g/dL (ref 30.0–36.0)
MCV: 92.7 fL (ref 78.0–100.0)
Platelets: 260 10*3/uL (ref 150–400)
RBC: 3.41 MIL/uL — AB (ref 4.22–5.81)
RDW: 12.7 % (ref 11.5–15.5)
WBC: 6.3 10*3/uL (ref 4.0–10.5)

## 2015-05-12 LAB — BASIC METABOLIC PANEL
Anion gap: 6 (ref 5–15)
BUN: 6 mg/dL (ref 6–20)
CHLORIDE: 106 mmol/L (ref 101–111)
CO2: 28 mmol/L (ref 22–32)
Calcium: 8.4 mg/dL — ABNORMAL LOW (ref 8.9–10.3)
Creatinine, Ser: 0.9 mg/dL (ref 0.61–1.24)
GFR calc Af Amer: >60 mL/min (ref >60)
GFR calc non Af Amer: >60 mL/min (ref >60)
GLUCOSE: 108 mg/dL — AB (ref 65–99)
POTASSIUM: 3.4 mmol/L — AB (ref 3.5–5.1)
SODIUM: 140 mmol/L (ref 135–145)

## 2015-05-12 MED ORDER — PANTOPRAZOLE SODIUM 40 MG PO TBEC: 40.0000 mg | DELAYED_RELEASE_TABLET | Freq: Two times a day (BID) | ORAL | Status: DC

## 2015-05-12 MED ORDER — CARVEDILOL 6.25 MG PO TABS: 3.1250 mg | ORAL_TABLET | Freq: Two times a day (BID) | ORAL | Status: DC

## 2015-05-12 MED ORDER — CARVEDILOL 3.125 MG PO TABS
3.1250 mg | ORAL_TABLET | Freq: Two times a day (BID) | ORAL | Status: DC
  Administered 2015-05-13 – 2015-05-15 (×4): 3.125 mg via ORAL
  Filled 2015-05-12 (×6): qty 1

## 2015-05-12 MED ORDER — NITROGLYCERIN 0.4 MG/HR TD PT24: 0.4000 mg | MEDICATED_PATCH | Freq: Every day | TRANSDERMAL | Status: DC

## 2015-05-12 NOTE — Progress Notes (Signed)
    Subjective: Ate just a small amount of breakfast. States he does not like to eat early in the morning. Feels tired. No overt GI bleeding. No N/V.   Objective: Vital signs in last 24 hours: Temp:  [97.3 F (36.3 C)-98 F (36.7 C)] 97.4 F (36.3 C) (12/27 0649) Pulse Rate:  [61-72] 68 (12/27 0649) Resp:  [7-23] 20 (12/27 0649) BP: (97-147)/(59-79) 107/63 mmHg (12/27 0649) SpO2:  [98 %-100 %] 100 % (12/27 0809) Last BM Date: 05/10/15 General:   Alert and oriented, pleasant Head:  Normocephalic and atraumatic. Abdomen:  Bowel sounds present, soft, non-tender, non-distended. No HSM or hernias noted. No rebound or guarding. No masses appreciated  Neurologic:  Alert and  oriented x4;  grossly normal neurologically. Psych:  Alert and cooperative. Normal mood and affect.  Intake/Output from previous day: 12/26 0701 - 12/27 0700 In: 120 [P.O.:120] Out: -  Intake/Output this shift:    Lab Results:  Recent Labs  05/09/15 1033 05/10/15 1105 05/12/15 0618  WBC 7.0 6.5 6.3  HGB 10.9* 10.6* 11.0*  HCT 32.3* 31.2* 31.6*  PLT 211 204 260   BMET  Recent Labs  05/12/15 0618  NA 140  K 3.4*  CL 106  CO2 28  GLUCOSE 108*  BUN 6  CREATININE 0.90  CALCIUM 8.4*    Assessment: 81 year old male with multiple comorbidities, admitted with hematemesis. EGD with extensive ulceration to distal half of esophagus, stricture at GE junction s/p dilation, duodenitis and 2 small ulcers with high grade stricture s/p balloon dilation. Large stomach on EGD and may need GES. Unable to determine if truly tolerating diet, as he has only had a small amount of full liquids this morning, stating he is not much of a morning eater. If he does not do well with lunch, would recommend a GES. As of note, he denies any N/V, or any further GI bleeding.     Plan: Continue full liquids. Will determine need for GES after lunch Continue PPI BID Further recommendations later today.  Orvil Feil,  ANP-BC [facility]     LOS: 7 days    05/12/2015, 8:32 AM    GI attending note: EGD findings discussed with patient's son was at bedside. He has not had any problems with full liquids. Will advance diet to Pureed diet starting tomorrow morning. If he does well he should be able to go home tomorrow afternoon. Transition him to oral PPI at the time of discharge.

## 2015-05-12 NOTE — Discharge Summary (Signed)
Physician Discharge Summary  Edward Trevino [id number] DOB: 01-09-1932 DOA: 05/03/2015  PCP: Deloria Lair, MD  Admit date: 05/03/2015 Discharge date: 05/12/2015  Time spent: 40 minutes  Recommendations for Outpatient Follow-up:  1. Follow up with GI as an outpatient for repeat EGD 2. Follow up with Cardiology in 2 weeks to readdress cardiac regimen   Discharge Diagnoses:  Principal Problem:   Hematemesis with nausea Active Problems:   Erosive esophagitis   Chest pain with moderate risk of acute coronary syndrome   Orthostatic hypotension   Cardiomyopathy, ischemic-EF 123456   Chronic systolic CHF (congestive heart failure) (HCC)   History of STEMI-Oct 2016- conservative Rx   Dyslipidemia   Atrial fibrillation, new onset (HCC)   Emesis   Loss of weight   Poor appetite   Gastric outlet obstruction   Acute esophagitis   Gastric outflow obstruction   Peptic ulcer   Discharge Condition: improved  Diet recommendation: low salt, pureed diet  Filed Weights   05/03/15 1717 05/03/15 2305  Weight: 81.647 kg (180 lb) 82.555 kg (182 lb)    History of present illness:  This is an 81 year old patient with a history of coronary artery disease who presented to the emergency room with complaints of intermittent chest pain. There was concern that he may have underlying angina and he was admitted to the hospital for further evaluation.  Hospital Course:  Patient was initially admitted to the hospital with chest pain. He was seen by cardiology and was felt to have possible angina. He had mild elevation of troponins which remained flat and were not consistent with true ACS. EKG was nonacute. Patient's cardiac medications were adjusted, although titration of his metastases proved difficult with his soft blood pressures. Lisinopril has been discontinued and Coreg dose was reduced. He is no longer having any chest pain.  He was also noted to be significantly dizzy on standing. Orthostatics  were checked and patient was found to have significant orthostatic hypotension. Lisinopril was stopped and patient's Coreg dose was decreased. He was started on IV fluids with improvement of his symptoms. He denies any symptoms at present on standing. He continues to have significant orthostatic hypotension, consider compression hose.  His hospital course was complicated by development of hematemesis and upper GI bleed. Cardiology discontinued his Plavix and aspirin was placed on hold. He was seen by gastroenterology who performed EGD with results as below. He was started on proton pump inhibitors and diet was slowly advanced. Due to evidence of esophageal/duodenal strictures and evidence of Gastric outlet obstruction, he was initially started on liquids which he appears to be tolerating. He'll be advanced to pureed diet and should not consume regular food since his risk for obstruction is high. He will need to continue on PPI. Since his risk of bleeding is high, recommendations are to hold off on resuming aspirin until follow-up EGD is done as an outpatient.  Patient's chronic systolic heart failure with EF of 20% appears compensated at this time. He does not have any evidence of volume overload. The remainder of his medical problems have been stable. He was seen by physical therapy who recommended home health physical therapy. If he tolerates his puree diet, he will likely be discharged home on 12/28.  Procedures: EGD 12/22: Severe esophagitis likely with a major reflux component?"rule out coexisting Candida infection. Peptic ulcer disease producing high-grade gastric outlet obstruction.  Currently upper GI track nonfunctional. I suspect more bleeding from  the esophagus than from the duodenal ulcer.  EGD 12/26: Duodenal stricture was dilated with balloon dilator. Balloon dilator was advanced through the scope and across the stricture under direct vision. Balloon was insufflated to a diameter of  10 mm and maintained for a few seconds and then diameter increase 12 mm and maintained for 2 minutes. Balloon.was withdrawn. Minimal bleeding noted from stricture post dilation. I was still unable to pass the scope across it because all of the scope was used up in the stomach. Single piece of food debris was removed from the stomach using Roth net.  Consultations:  Gastroenterology  Cardiology  Discharge Exam: Filed Vitals:   05/12/15 1343 05/12/15 1400  BP: 119/69 110/66  Pulse: 72 79  Temp: 97.6 F (36.4 C) 97.9 F (36.6 C)  Resp: 20 20    General: NAD Cardiovascular: S1, S2 RRR Respiratory: CTA B  Discharge Instructions    Current Discharge Medication List    START taking these medications   Details  nitroGLYCERIN (NITRODUR - DOSED IN MG/24 HR) 0.4 mg/hr patch Place 1 patch (0.4 mg total) onto the skin daily. Qty: 30 patch, Refills: 12    pantoprazole (PROTONIX) 40 MG tablet Take 1 tablet (40 mg total) by mouth 2 (two) times daily before a meal. Qty: 60 tablet, Refills: 1      CONTINUE these medications which have CHANGED   Details  carvedilol (COREG) 6.25 MG tablet Take 0.5 tablets (3.125 mg total) by mouth 2 (two) times daily. Qty: 180 tablet, Refills: 3      CONTINUE these medications which have NOT CHANGED   Details  atorvastatin (LIPITOR) 80 MG tablet Take 1 tablet (80 mg total) by mouth daily at 6 PM. Qty: 30 tablet, Refills: 5    mirtazapine (REMERON) 30 MG tablet Take 30 mg by mouth at bedtime.    QUEtiapine (SEROQUEL) 100 MG tablet Take 100 mg by mouth at bedtime.    Venlafaxine HCl 75 MG TB24 Take 75 mg by mouth at bedtime.    nitroGLYCERIN (NITROSTAT) 0.4 MG SL tablet Place 1 tablet (0.4 mg total) under the tongue every 5 (five) minutes x 3 doses as needed for chest pain. Qty: 25 tablet, Refills: 2      STOP taking these medications     aspirin EC 81 MG EC tablet      bismuth subsalicylate (PEPTO BISMOL) 262 MG/15ML suspension       clopidogrel (PLAVIX) 75 MG tablet      lisinopril (PRINIVIL,ZESTRIL) 2.5 MG tablet        No Known Allergies Follow-up Information    Follow up with Dorris Carnes, MD On 05/12/2015.   Specialty:  Cardiology   Why:  at 1:00 pm in the [place] office   Contact information:   [place] [address] [phone]        The results of significant diagnostics from this hospitalization (including imaging, microbiology, ancillary and laboratory) are listed below for reference.    Significant Diagnostic Studies: Dg Chest 2 View  05/03/2015  CLINICAL DATA:  81 year old male with chest pain EXAM: CHEST  2 VIEW COMPARISON:  Radiograph dated 02/28/2015 and 07/22/2012 FINDINGS: Two views of the chest demonstrate emphysematous changes of the lungs. There is no focal consolidation, pleural effusion, or pneumothorax. Stable small right lung base nodules noted. The cardiac silhouette is within normal limits. The osseous structures appear unremarkable. IMPRESSION: No active cardiopulmonary disease. Electronically Signed   By: Anner Crete M.D.   On: 05/03/2015 19:10  Microbiology: Recent Results (from the past 240 hour(s))  KOH prep     Status: None   Collection Time: 05/07/15  2:10 PM  Result Value Ref Range Status   Specimen Description ESOPHAGUS BRUSHING  Final   Special Requests NONE  Final   KOH Prep   Final    NO YEAST OR FUNGAL ELEMENTS SEEN Performed at [facility]    Report Status 05/07/2015 FINAL  Final     Labs: Basic Metabolic Panel:  Recent Labs Lab 05/06/15 0610 05/08/15 0638 05/12/15 0618  NA 141 139 140  K 3.7 3.9 3.4*  CL 104 109 106  CO2 29 26 28   GLUCOSE 127* 94 108*  BUN 16 19 6   CREATININE 0.96 1.00 0.90  CALCIUM 8.3* 8.2* 8.4*  MG  --  2.0  --    Liver Function Tests:  Recent Labs Lab 05/07/15 1045 05/08/15 0638  AST 24 23  ALT 22 20  ALKPHOS 202* 185*  BILITOT 0.8 0.6  PROT 6.2* 5.6*  ALBUMIN 3.1*  2.8*   No results for input(s): LIPASE, AMYLASE in the last 168 hours. No results for input(s): AMMONIA in the last 168 hours. CBC:  Recent Labs Lab 05/07/15 0231 05/08/15 0638 05/09/15 1033 05/10/15 1105 05/12/15 0618  WBC 14.7* 8.0 7.0 6.5 6.3  NEUTROABS 11.7*  --   --   --   --   HGB 12.7* 10.7* 10.9* 10.6* 11.0*  HCT 37.1* 32.3* 32.3* 31.2* 31.6*  MCV 93.9 95.6 94.2 92.6 92.7  PLT 209 191 211 204 260   Cardiac Enzymes: No results for input(s): CKTOTAL, CKMB, CKMBINDEX, TROPONINI in the last 168 hours. BNP: BNP (last 3 results)  Recent Labs  02/28/15 1430  BNP 531.1*    ProBNP (last 3 results) No results for input(s): PROBNP in the last 8760 hours.  CBG: No results for input(s): GLUCAP in the last 168 hours.     SignedKathie Dike MD    [facility] 05/12/2015, 7:02 PM

## 2015-05-12 NOTE — Progress Notes (Signed)
[facility] PROGRESS NOTE  Edward Trevino [id number] DOB: [birth year] DOA: 05/03/2015 PCP: Deloria Lair, MD Summary  15 yomwith ischemic cardiomyopathy and ejection fraction of 20% presented to the hospital with chest pain. Had mild elevation of troponins without any clear evidence of ACS. Evaluated cardiology who felt that his chest pain may be related to angina and recommended medical management. Developed hematemesis and EGD revealed esophagitis as well as PUD and GOO. Repeat EGD 12/26 showed esophogeal stricture that was dilated as well as duodenal stricture that was dilated. If patient continue to have symptoms and does not tolerate diet he will likely have GES.   Assessment/Plan: 1. Hematemesis/UGIB secondary to severe reflux esophagitis. Hgb remains stable. No recurrent episodes. Management per GI. Continue PPI. 2. Peptic ulcer disease producing high-grade partial gastric outlet obstruction. Repeat EGD 12/26, due to findings GI has recommended he stays on full liquid diet, GES will be determined after lunch. Await further recommendations. Continue PPI.   3. ABLA, remains stable. No evidence of ongoing bleeding. 4. CP, suspected angina, with associated SOB, abd pain, n/v on admission. No further pain. EKG consistent with prior anterior infarct per cardiology. ASA and Plavix on hold. Resume ASA when ok with GI. Do not resume Plavix.  5. Elevated troponin. Troponins flat, not consistent with ACS. EKG nonacute.  6. Orthostatic hypotension. Lisinopril stopped. Coreg decreased to 3.125 BID. Consider Midodrin or compression hose if needed. Appears to be improving with IVF. 7. AKI, resolved with IVF.  8. Chronic systolic CHF, appears compensated. No evidence of overload. 9. CAD, anterior STEMI 02/2015 managed conservatively 10. Ischemic cardiomyopathy, chronic systolic CHF, LVEF Q000111Q. Titration of meds limited by low BP. 11. Hyperlipidemia, continue statin.   Code Status:  Full DVT prophylaxis: Lovenox Family Communication: Discussed with patient who understands and has no concerns at this time.  Disposition Plan: Anticipate discharge within 1-2 days.    Consultants:  Cardiology  GI  PT- No Follow Up  Procedures:  none  Antibiotics:  None   HPI/Subjective:  Feeling fine. Does not have an appetite (Not a morning eater). Denies any nausea, vomiting, or abdominal pain. Bowel movements without issues.  Objective: Filed Vitals:   05/11/15 2131 05/12/15 0649  BP: 138/79 107/63  Pulse: 63 68  Temp: 97.7 F (36.5 C) 97.4 F (36.3 C)  Resp: 20 20    Intake/Output Summary (Last 24 hours) at 05/12/15 0656 Last data filed at 05/11/15 1900  Gross per 24 hour  Intake    120 ml  Output      0 ml  Net    120 ml   Filed Weights   05/03/15 1717 05/03/15 2305  Weight: 81.647 kg (180 lb) 82.555 kg (182 lb)    Exam:  General: NAD. Appears calm and looks comfortable. Cardiovascular: RRR Respiratory: CTAB. No wheezing, rales or rhonchi. Speaks in full sentences.  Abdomen: positive bowel sounds. soft, non tender, no distention  Musculoskeletal: No edema b/l  Data Reviewed: Basic Metabolic Panel:  Recent Labs Lab 05/06/15 0610 05/08/15 0638  NA 141 139  K 3.7 3.9  CL 104 109  CO2 29 26  GLUCOSE 127* 94  BUN 16 19  CREATININE 0.96 1.00  CALCIUM 8.3* 8.2*  MG  --  2.0   Liver Function Tests:  Recent Labs Lab 05/07/15 1045 05/08/15 0638  AST 24 23  ALT 22 20  ALKPHOS 202* 185*  BILITOT 0.8 0.6  PROT 6.2* 5.6*  ALBUMIN 3.1* 2.8*   No results  for input(s): LIPASE, AMYLASE in the last 168 hours. CBC:  Recent Labs Lab 05/06/15 0610 05/07/15 0231 05/08/15 0638 05/09/15 1033 05/10/15 1105  WBC 11.3* 14.7* 8.0 7.0 6.5  NEUTROABS  --  11.7*  --   --   --   HGB 12.8* 12.7* 10.7* 10.9* 10.6*  HCT 38.1* 37.1* 32.3* 32.3* 31.2*  MCV 94.5 93.9 95.6 94.2 92.6  PLT 179 209 191 211 204   Cardiac Enzymes: No results for  input(s): CKTOTAL, CKMB, CKMBINDEX, TROPONINI in the last 168 hours. BNP (last 3 results)  Recent Labs  02/28/15 1430  BNP 531.1*     Recent Results (from the past 240 hour(s))  KOH prep     Status: None   Collection Time: 05/07/15  2:10 PM  Result Value Ref Range Status   Specimen Description ESOPHAGUS BRUSHING  Final   Special Requests NONE  Final   KOH Prep   Final    NO YEAST OR FUNGAL ELEMENTS SEEN Performed at [facility]    Report Status 05/07/2015 FINAL  Final      Scheduled Meds: . metoprolol  5 mg Intravenous 3 times per day  . nitroGLYCERIN  0.4 mg Transdermal Daily  . pantoprazole (PROTONIX) IV  40 mg Intravenous Q12H   Continuous Infusions: . sodium chloride 10 mL/hr at 05/11/15 1205    Principal Problem:   Hematemesis with nausea Active Problems:   Erosive esophagitis   Chest pain with moderate risk of acute coronary syndrome   Orthostatic hypotension   Cardiomyopathy, ischemic-EF 123456   Chronic systolic CHF (congestive heart failure) (HCC)   History of STEMI-Oct 2016- conservative Rx   Dyslipidemia   Atrial fibrillation, new onset (HCC)   Emesis   Loss of weight   Poor appetite   Gastric outlet obstruction   Acute esophagitis   Gastric outflow obstruction   Peptic ulcer    Time spent: 25 minutes   Johnhenry Tippin. MD  [facility] Pager [phone]. If 7PM-7AM, please contact night-coverage at [email or web address], password [facility] 05/12/2015, 6:56 AM  LOS: 7 days      By signing my name below, I, Rennis Harding, attest that this documentation has been prepared under the direction and in the presence of Kathie Dike, MD. Electronically signed: Rennis Harding, Scribe. 05/12/2015 11:00am   I, Dr. Kathie Dike, personally performed the services described in this documentaiton. All medical record entries made by the scribe were at my direction and in my presence. I have reviewed the chart and agree that the record reflects my  personal performance and is accurate and complete  Kathie Dike, MD, 05/12/2015 6:58 PM

## 2015-05-12 NOTE — Care Management Note (Signed)
Case Management Note  Patient Details  Name: Edward Trevino MRN: [id number] Date of Birth: 04/11/1932   Expected Discharge Date:  05/04/15               Expected Discharge Plan:  Home/Self Care  In-House Referral:  NA  Discharge planning Services  CM Consult  Post Acute Care Choice:  NA Choice offered to:  NA  DME Arranged:    DME Agency:     HH Arranged:    [place] Agency:     Status of Service:  Completed, signed off  Medicare Important Message Given:  Yes Date Medicare IM Given:    Medicare IM give by:    Date Additional Medicare IM Given:    Additional Medicare Important Message give by:     If discussed at [place] of Stay Meetings, dates discussed:  05/12/2015  Additional Comments: Pt anticipate DC in next 24 hr. Pt has been active this admission, currently sitting up in bed. Pt reports having assistance at home when he needs it. Pt refuses HH at DC. No CM needs.   Sherald Barge, RN 05/12/2015, 3:53 PM

## 2015-05-12 NOTE — Care Management Important Message (Signed)
Important Message  Patient Details  Name: Edward Trevino MRN: [id number] Date of Birth: 02-01-1932   Medicare Important Message Given:  Yes    Sherald Barge, RN 05/12/2015, 2:55 PM

## 2015-05-13 ENCOUNTER — Encounter (HOSPITAL_COMMUNITY): Payer: Self-pay | Admitting: Internal Medicine

## 2015-05-13 DIAGNOSIS — K208 Other esophagitis: Secondary | ICD-10-CM

## 2015-05-13 MED ORDER — ONDANSETRON HCL 4 MG/2ML IJ SOLN
4.0000 mg | Freq: Three times a day (TID) | INTRAMUSCULAR | Status: DC
  Administered 2015-05-13 – 2015-05-15 (×6): 4 mg via INTRAVENOUS
  Filled 2015-05-13 (×7): qty 2

## 2015-05-13 MED ORDER — PANTOPRAZOLE SODIUM 40 MG IV SOLR
40.0000 mg | Freq: Two times a day (BID) | INTRAVENOUS | Status: DC
  Administered 2015-05-13 – 2015-05-14 (×2): 40 mg via INTRAVENOUS
  Filled 2015-05-13 (×2): qty 40

## 2015-05-13 NOTE — Progress Notes (Signed)
Subjective:  "I'm back to square one." food coming back up. Cannot eat. Pressure in epigastrium with eating. "the food won't pass."  Objective: Vital signs in last 24 hours: Temp:  [97.5 F (36.4 C)-97.9 F (36.6 C)] 97.8 F (36.6 C) (12/28 0528) Pulse Rate:  [68-79] 76 (12/28 0528) Resp:  [20] 20 (12/28 0528) BP: (110-135)/(65-70) 135/70 mmHg (12/28 0528) SpO2:  [100 %] 100 % (12/28 0528) Last BM Date: 05/10/15 General:   Alert,  Well-developed, well-nourished, pleasant and cooperative in NAD Head:  Normocephalic and atraumatic. Eyes:  Sclera clear, no icterus.  Abdomen:  Soft, nontender and nondistended.   Normal bowel sounds, without guarding, and without rebound.   Extremities:  Without clubbing, deformity or edema. Neurologic:  Alert and  oriented x4;  grossly normal neurologically. Skin:  Intact without significant lesions or rashes. Psych:  Alert and cooperative. Normal mood and affect.  Intake/Output from previous day: 12/27 0701 - 12/28 0700 In: 360 [P.O.:360] Out: -  Intake/Output this shift: Total I/O In: 120 [P.O.:120] Out: -   Lab Results: CBC  Recent Labs  05/10/15 1105 05/12/15 0618  WBC 6.5 6.3  HGB 10.6* 11.0*  HCT 31.2* 31.6*  MCV 92.6 92.7  PLT 204 260   BMET  Recent Labs  05/12/15 0618  NA 140  K 3.4*  CL 106  CO2 28  GLUCOSE 108*  BUN 6  CREATININE 0.90  CALCIUM 8.4*   LFTs No results for input(s): BILITOT, BILIDIR, IBILI, ALKPHOS, AST, ALT, PROT, ALBUMIN in the last 72 hours. No results for input(s): LIPASE in the last 72 hours. PT/INR No results for input(s): LABPROT, INR in the last 72 hours.    Imaging Studies: Dg Chest 2 View  05/03/2015  CLINICAL DATA:  81 year old male with chest pain EXAM: CHEST  2 VIEW COMPARISON:  Radiograph dated 02/28/2015 and 07/22/2012 FINDINGS: Two views of the chest demonstrate emphysematous changes of the lungs. There is no focal consolidation, pleural effusion, or pneumothorax. Stable small  right lung base nodules noted. The cardiac silhouette is within normal limits. The osseous structures appear unremarkable. IMPRESSION: No active cardiopulmonary disease. Electronically Signed   By: Anner Crete M.D.   On: 05/03/2015 19:10  [2 weeks]   Assessment: 81 year old male with multiple comorbidities, admitted with hematemesis. EGD with extensive ulceration to distal half of esophagus, stricture at GE junction s/p dilation, deformity of duodenal bulb with duodenitis and 2 small ulcers with high grade stricture s/p balloon dilation. Large stomach on EGD. Scope has not been passed pass the duodenal bulb so far.  May need GES.   Reportedly had been tolerating full liquids yesterday. Eggs/grits this morning and now with vomiting and discomfort. No evidence of any further GI bleeding.   Plan: 1. Continue PPI BID.  2. Patient not tolerating diet this morning. To discuss with Dr. Oneida Alar. May need further imaging, ie GES vs UGI or CT. Patient's duodenum distal to bulb has not been evaluated due to difficulty passing scope in setting of large stomach.  3. Further recommendations to follow.   Laureen Ochs. Bernarda Caffey [facility] [phone] 12/28/20169:34 AM     LOS: 8 days

## 2015-05-13 NOTE — Progress Notes (Signed)
PROGRESS NOTE  Edward Trevino [id number] DOB: [birth year] DOA: 05/03/2015 PCP: Deloria Lair, MD  Summary: 70 yom with ischemic cardiomyopathy and ejection fraction of 20% presented to the hospital with chest pain. Had mild elevation of troponins without any clear evidence of ACS. Seen by cardiology who felt that his chest pain may be related to angina and recommended medical management. Developed hematemesis and EGD revealed esophagitis as well as PUD and GOO. NPO for now, GI plans to advance diet 1-2 days and EGD 12/26 to dilate GOO.  Assessment/Plan: 1. Peptic ulcer disease producing high-grade partial gastric outlet obstruction. S/p EGD 12/26 with dilitation. Seems to be tolerating diet now. 2. Hematemesis/UGIB secondary to severe reflux esophagitis. Hgb stable. No recurrent episodes. Management per GI.  3. ABLA, stable. Secondary to #2. 4. CP, suspected angina, with associated SOB, abd pain, n/v on admission. asymptomatic. EKG consistent with prior anterior infarct per cardiology. ASA and Plavix on hold. Resume ASA when ok with GI. Do not resume Plavix.  5. Elevated troponin. Troponins flat, not consistent with ACS. EKG nonacute.  6. Orthostatic hypotension. Lisinopril stopped. Coreg decreased to 3.125 BID. Consider Midodrin or compression hose if needed 7. AKI, resolved with IVF.  8. Chronic systolic CHF, stable. Patient NPO, will start IVF per cardiology.  9. CAD, anterior STEMI 02/2015 managed conservatively 10. Ischemic cardiomyopathy, chronic systolic CHF, LVEF Q000111Q. Titration of meds limited by low BP. 11. Hyperlipidemia, continue statin.   Overall improved.  Plan continue PPI, follow up GI recommendations.   Tolerating diet. No regular food.  GI recommends withholding ASA (high bleeding risk) until f/u EGD as an outpatient.  Will f/u GI recs. If continues to tolerate diet anticipate discharge 12/29.  Code Status: Full DVT prophylaxis: Lovenox Family  Communication:No family at bedside. Disposition Plan: Home, refuses HH  Murray Hodgkins, MD  [facility]  Pager [phone] If 7PM-7AM, please contact night-coverage at [email or web address], password [facility] 05/13/2015, 11:28 AM  LOS: 8 days    Consultants:  Cardiology  GI  PT- No Follow Up  Procedures:  EGD 12/26 Impression: Extensive ulceration to distal half of esophagus along with stricture at GE junction which was dilated with the scope. Small sliding hiatal hernia. Large stomach with single piece of food debris which was removed using Roth net. Deformity to duodenal bulb with duodenitis and 2 small ulcers and high-grade stricture. This stricture was dilated with balloon dilator to 12 mm. Scope could not be advanced distally because of very large stomach.  Antibiotics:  none   HPI/Subjective: Feeling better this afternoon. Ate "3 meats" for lunch. No n/v now. No CP. Breathing fine.  Objective: Filed Vitals:   05/12/15 1343 05/12/15 1400 05/12/15 2120 05/13/15 0528  BP: 119/69 110/66 114/65 135/70  Pulse: 72 79 68 76  Temp: 97.6 F (36.4 C) 97.9 F (36.6 C) 97.5 F (36.4 C) 97.8 F (36.6 C)  TempSrc:  Oral Oral Oral  Resp: 20 20 20 20   Height:      Weight:      SpO2: 100% 100% 100% 100%    Intake/Output Summary (Last 24 hours) at 05/13/15 1128 Last data filed at 05/13/15 0900  Gross per 24 hour  Intake    360 ml  Output      0 ml  Net    360 ml     Filed Weights   05/03/15 1717 05/03/15 2305  Weight: 81.647 kg (180 lb) 82.555 kg (182 lb)    Exam:    General:  Appears calm and comfortable Cardiovascular: RRR, no m/r/g. No LE edema. Respiratory: CTA bilaterally, no w/r/r. Normal respiratory effort. Abdomen: soft, ntnd Psychiatric: grossly normal mood and affect, speech fluent and appropriate  Scheduled Meds: . carvedilol  3.125 mg Oral BID WC  . nitroGLYCERIN  0.4 mg Transdermal Daily  . pantoprazole (PROTONIX) IV  40 mg Intravenous Q12H    Continuous Infusions: . sodium chloride 10 mL/hr at 05/11/15 1205    Principal Problem:   Hematemesis with nausea Active Problems:   Erosive esophagitis   Chest pain with moderate risk of acute coronary syndrome   Orthostatic hypotension   Cardiomyopathy, ischemic-EF 123456   Chronic systolic CHF (congestive heart failure) (HCC)   History of STEMI-Oct 2016- conservative Rx   Dyslipidemia   Atrial fibrillation, new onset (HCC)   Emesis   Loss of weight   Poor appetite   Gastric outlet obstruction   Acute esophagitis   Gastric outflow obstruction   Peptic ulcer   Time spent 20 minutes   By signing my name below, I, Rosalie Doctor attest that this documentation has been prepared under the direction and in the presence of Murray Hodgkins, MD Electronically signed: Rosalie Doctor, Scribe.  05/13/2015  I personally performed the services described in this documentation. All medical record entries made by the scribe were at my direction. I have reviewed the chart and agree that the record reflects my personal performance and is accurate and complete. Murray Hodgkins, MD

## 2015-05-14 ENCOUNTER — Encounter (HOSPITAL_COMMUNITY): Payer: Self-pay | Admitting: Internal Medicine

## 2015-05-14 ENCOUNTER — Inpatient Hospital Stay (HOSPITAL_COMMUNITY): Payer: Medicare Other

## 2015-05-14 ENCOUNTER — Encounter: Payer: Self-pay | Admitting: Internal Medicine

## 2015-05-14 ENCOUNTER — Telehealth: Payer: Self-pay | Admitting: Gastroenterology

## 2015-05-14 MED ORDER — POLYETHYLENE GLYCOL 3350 17 G PO PACK
17.0000 g | PACK | Freq: Two times a day (BID) | ORAL | Status: DC
  Administered 2015-05-14 – 2015-05-15 (×3): 17 g via ORAL
  Filled 2015-05-14 (×3): qty 1

## 2015-05-14 MED ORDER — PANTOPRAZOLE SODIUM 40 MG PO TBEC
40.0000 mg | DELAYED_RELEASE_TABLET | Freq: Two times a day (BID) | ORAL | Status: DC
  Administered 2015-05-14 – 2015-05-15 (×3): 40 mg via ORAL
  Filled 2015-05-14 (×3): qty 1

## 2015-05-14 MED ORDER — SODIUM CHLORIDE 0.9 % IV SOLN
INTRAVENOUS | Status: AC
  Administered 2015-05-14: 09:00:00 via INTRAVENOUS

## 2015-05-14 NOTE — Progress Notes (Signed)
Subjective:  Doesn't like breakfast. Has never eating before 10:30 in am. Did very well with lunch and evening meal. No other complaints today.   Objective: Vital signs in last 24 hours: Temp:  [97.2 F (36.2 C)-98.4 F (36.9 C)] 97.2 F (36.2 C) (12/29 0650) Pulse Rate:  [73-84] 76 (12/29 0650) Resp:  [20] 20 (12/29 0650) BP: (99-122)/(57-74) 104/60 mmHg (12/29 0650) SpO2:  [98 %] 98 % (12/29 0650) Last BM Date: 05/12/15 General:   Alert,  Well-developed, well-nourished, pleasant and cooperative in NAD Head:  Normocephalic and atraumatic. Eyes:  Sclera clear, no icterus.  Abdomen:  Soft, nontender and nondistended  Normal bowel sounds, without guarding, and without rebound.   Extremities:  Without clubbing, deformity or edema. Neurologic:  Alert and  oriented x4;  grossly normal neurologically. Skin:  Intact without significant lesions or rashes. Psych:  Alert and cooperative. Normal mood and affect.  Intake/Output from previous day: 12/28 0701 - 12/29 0700 In: 360 [P.O.:360] Out: -  Intake/Output this shift:    Lab Results: CBC  Recent Labs  05/12/15 0618  WBC 6.3  HGB 11.0*  HCT 31.6*  MCV 92.7  PLT 260   BMET  Recent Labs  05/12/15 0618  NA 140  K 3.4*  CL 106  CO2 28  GLUCOSE 108*  BUN 6  CREATININE 0.90  CALCIUM 8.4*   LFTs No results for input(s): BILITOT, BILIDIR, IBILI, ALKPHOS, AST, ALT, PROT, ALBUMIN in the last 72 hours. No results for input(s): LIPASE in the last 72 hours. PT/INR No results for input(s): LABPROT, INR in the last 72 hours.    Imaging Studies: Dg Chest 2 View  05/03/2015  CLINICAL DATA:  81 year old male with chest pain EXAM: CHEST  2 VIEW COMPARISON:  Radiograph dated 02/28/2015 and 07/22/2012 FINDINGS: Two views of the chest demonstrate emphysematous changes of the lungs. There is no focal consolidation, pleural effusion, or pneumothorax. Stable small right lung base nodules noted. The cardiac silhouette is within normal  limits. The osseous structures appear unremarkable. IMPRESSION: No active cardiopulmonary disease. Electronically Signed   By: Anner Crete M.D.   On: 05/03/2015 19:10  [2 weeks]   Assessment: 81 year old male with multiple comorbidities, admitted with hematemesis. EGD with extensive ulceration to distal half of esophagus, stricture at GE junction s/p dilation, deformity of duodenal bulb with duodenitis and 2 small ulcers with high grade stricture s/p balloon dilation. Large stomach on EGD. Scope has not been passed pass the duodenal bulb so far.   Tolerated full liquids yesterday at lunch and evening. Patient doesn't like breakfast. No evidence of any further GI bleeding.   Plan: PPI BID. Continue full liquids.  Can follow as outpatient in 3-4 weeks.   Laureen Ochs. Bernarda Caffey [facility] [phone] 12/29/20169:31 AM     LOS: 9 days    Addendum: discussed with Dr. Oneida Alar. We will schedule UGI series today to ensure adequate emptying of stomach with gastrografin.  Laureen Ochs. Bernarda Caffey [facility] [phone] 12/29/201610:05 AM

## 2015-05-14 NOTE — Care Management Note (Signed)
Case Management Note  Patient Details  Name: KALO DAHLKE MRN: [id number] Date of Birth: [birth year]  Subjective/Objective:                    Action/Plan:   Expected Discharge Date:  05/04/15               Expected Discharge Plan:  Home/Self Care  In-House Referral:  NA  Discharge planning Services  CM Consult  Post Acute Care Choice:  NA Choice offered to:  NA  DME Arranged:    DME Agency:     HH Arranged:    [place] Agency:     Status of Service:  Completed, signed off  Medicare Important Message Given:  Yes Date Medicare IM Given:    Medicare IM give by:    Date Additional Medicare IM Given:    Additional Medicare Important Message give by:     If discussed at [place] of Stay Meetings, dates discussed:    Additional Comments: Anticipate discharge today if UGI is ok. No CM needs noted. Pt stated his son will come get him. Christinia Gully [place], RN 05/14/2015, 4:10 PM

## 2015-05-14 NOTE — Progress Notes (Addendum)
PROGRESS NOTE  Edward Trevino [id number] DOB: April 08, 1932 DOA: 05/03/2015 PCP: Deloria Lair, MD  Summary: 27 yom with ischemic cardiomyopathy and ejection fraction of 20% presented to the hospital with chest pain. Had mild elevation of troponins without any clear evidence of ACS. Seen by cardiology who felt that his chest pain may be related to angina and recommended medical management. Developed hematemesis and EGD revealed esophagitis as well as PUD and GOO. NPO for now, GI plans to advance diet 1-2 days and EGD 12/26 to dilate GOO.  Assessment/Plan: 1. Peptic ulcer disease producing high-grade partial gastric outlet obstruction. S/p EGD 12/26 with dilitation. Seems to be tolerating diet now. GI will perform UGI series today with gastrografin.  2. Hematemesis/UGIB secondary to severe reflux esophagitis. Hgb stable. No recurrent episodes. Management per GI.  3. ABLA, stable. Secondary to #2. 4. CP, suspected angina, with associated SOB, abd pain, n/v on admission. asymptomatic. EKG consistent with prior anterior infarct per cardiology. ASA and Plavix on hold. Resume ASA when ok with GI. Do not resume Plavix.  5. Elevated troponin. Troponins flat, not consistent with ACS. EKG nonacute.  6. Orthostatic hypotension, recurrent, asymptomatic. Compression hose added. Lisinopril stopped. Coreg decreased to 3.125 BID.  7. AKI, resolved with IVF.  8. Chronic systolic CHF, stable.   9. CAD, anterior STEMI 02/2015 managed conservatively 10. Ischemic cardiomyopathy, chronic systolic CHF, LVEF Q000111Q. Titration of meds limited by low BP. 11. Hyperlipidemia, continue statin.   Overall improved. Tolerating diet.   Follow up UGI results.   GI recommends withholding ASA (high bleeding risk) until f/u EGD as an outpatient.  Potentially home later today.   Code Status: Full DVT prophylaxis: Lovenox Family Communication: No family at bedside. Disposition Plan: Home, refuses HH  Murray Hodgkins, MD  [facility]  Pager [phone] If 7PM-7AM, please contact night-coverage at [email or web address], password [facility] 05/14/2015, 7:15 AM  LOS: 9 days    Consultants:  Cardiology  GI  PT- No Follow Up  Procedures:  EGD 12/26 Impression: Extensive ulceration to distal half of esophagus along with stricture at GE junction which was dilated with the scope. Small sliding hiatal hernia. Large stomach with single piece of food debris which was removed using Roth net. Deformity to duodenal bulb with duodenitis and 2 small ulcers and high-grade stricture. This stricture was dilated with balloon dilator to 12 mm. Scope could not be advanced distally because of very large stomach.  Antibiotics:  none   HPI/Subjective: Feels okay. Has some nausea but no vomiting. Tolerated liquids for dinner and lunch yesterday. NPO right now.  Objective: Filed Vitals:   05/13/15 1334 05/13/15 1736 05/13/15 2027 05/14/15 0650  BP: 99/57 122/74 112/68 104/60  Pulse: 79 84 73 76  Temp: 98.2 F (36.8 C)  98.4 F (36.9 C) 97.2 F (36.2 C)  TempSrc: Oral  Oral Oral  Resp: 20  20 20   Height:      Weight:      SpO2:    98%    Intake/Output Summary (Last 24 hours) at 05/14/15 0715 Last data filed at 05/13/15 1700  Gross per 24 hour  Intake    360 ml  Output      0 ml  Net    360 ml     Filed Weights   05/03/15 1717 05/03/15 2305  Weight: 81.647 kg (180 lb) 82.555 kg (182 lb)    Exam:    VSS, afebrile General:  Appears calm and comfortable Cardiovascular: RRR, no m/r/g. No  LE edema. Respiratory: CTA bilaterally, no w/r/r. Normal respiratory effort. Abdomen: soft, ntnd Psychiatric: grossly normal mood and affect, speech fluent and appropriate Neurologic: grossly non-focal.   Scheduled Meds: . carvedilol  3.125 mg Oral BID WC  . nitroGLYCERIN  0.4 mg Transdermal Daily  . ondansetron (ZOFRAN) IV  4 mg Intravenous TID WC & HS  . pantoprazole (PROTONIX) IV  40 mg Intravenous  BID AC   Continuous Infusions: . sodium chloride 10 mL/hr at 05/11/15 1205    Principal Problem:   Hematemesis with nausea Active Problems:   Erosive esophagitis   Chest pain with moderate risk of acute coronary syndrome   Orthostatic hypotension   Cardiomyopathy, ischemic-EF 123456   Chronic systolic CHF (congestive heart failure) (HCC)   History of STEMI-Oct 2016- conservative Rx   Dyslipidemia   Atrial fibrillation, new onset (HCC)   Emesis   Loss of weight   Poor appetite   Gastric outlet obstruction   Acute esophagitis   Gastric outflow obstruction   Peptic ulcer     By signing my name below, I, Rosalie Doctor attest that this documentation has been prepared under the direction and in the presence of Murray Hodgkins, MD Electronically signed: Rosalie Doctor, Scribe.  05/14/2015 12:19pm  I personally performed the services described in this documentation. All medical record entries made by the scribe were at my direction. I have reviewed the chart and agree that the record reflects my personal performance and is accurate and complete. Murray Hodgkins, MD

## 2015-05-14 NOTE — Telephone Encounter (Signed)
APPT MADE AND LETTER SENT  °

## 2015-05-14 NOTE — Care Management Important Message (Signed)
Important Message  Patient Details  Name: Edward Trevino MRN: [id number] Date of Birth: [birth year]   Medicare Important Message Given:  Yes    Joylene Draft, RN 05/14/2015, 3:04 PM

## 2015-05-14 NOTE — Telephone Encounter (Signed)
Ov follow up in 3-4 weeks. For hospital follow up.

## 2015-05-15 ENCOUNTER — Encounter (HOSPITAL_COMMUNITY): Payer: Self-pay | Admitting: *Deleted

## 2015-05-15 ENCOUNTER — Encounter (HOSPITAL_COMMUNITY)
Admission: EM | Disposition: A | Discharge status: Home / Self Care | Payer: Self-pay | Source: Home / Self Care | Attending: Internal Medicine

## 2015-05-15 DIAGNOSIS — K315 Obstruction of duodenum: Secondary | ICD-10-CM

## 2015-05-15 DIAGNOSIS — K222 Esophageal obstruction: Secondary | ICD-10-CM

## 2015-05-15 DIAGNOSIS — K2970 Gastritis, unspecified, without bleeding: Secondary | ICD-10-CM

## 2015-05-15 HISTORY — PX: ESOPHAGOGASTRODUODENOSCOPY: SHX5428

## 2015-05-15 HISTORY — PX: ESOPHAGEAL DILATION: SHX303

## 2015-05-15 SURGERY — EGD (ESOPHAGOGASTRODUODENOSCOPY)
Anesthesia: Moderate Sedation

## 2015-05-15 MED ORDER — LIDOCAINE VISCOUS 2 % MT SOLN
OROMUCOSAL | Status: DC | PRN
  Administered 2015-05-15: 3 mL via OROMUCOSAL

## 2015-05-15 MED ORDER — MINERAL OIL PO OIL
TOPICAL_OIL | ORAL | Status: AC
  Filled 2015-05-15: qty 30

## 2015-05-15 MED ORDER — ENSURE ENLIVE PO LIQD
237.0000 mL | Freq: Three times a day (TID) | ORAL | Status: DC
  Administered 2015-05-15: 237 mL via ORAL
  Filled 2015-05-15: qty 237

## 2015-05-15 MED ORDER — SODIUM CHLORIDE 0.9 % IV SOLN: INTRAVENOUS | Status: DC

## 2015-05-15 MED ORDER — MIDAZOLAM HCL 5 MG/5ML IJ SOLN
INTRAMUSCULAR | Status: AC
  Filled 2015-05-15: qty 10

## 2015-05-15 MED ORDER — STERILE WATER FOR IRRIGATION IR SOLN
Status: DC | PRN
  Administered 2015-05-15: 14:00:00

## 2015-05-15 MED ORDER — MEPERIDINE HCL 100 MG/ML IJ SOLN
INTRAMUSCULAR | Status: AC
  Filled 2015-05-15: qty 2

## 2015-05-15 MED ORDER — LIDOCAINE VISCOUS 2 % MT SOLN
OROMUCOSAL | Status: AC
  Filled 2015-05-15: qty 15

## 2015-05-15 MED ORDER — MIDAZOLAM HCL 5 MG/5ML IJ SOLN
INTRAMUSCULAR | Status: DC | PRN
  Administered 2015-05-15 (×2): 1 mg via INTRAVENOUS
  Administered 2015-05-15: 2 mg via INTRAVENOUS

## 2015-05-15 MED ORDER — ONDANSETRON HCL 4 MG/2ML IJ SOLN
INTRAMUSCULAR | Status: AC
  Filled 2015-05-15: qty 2

## 2015-05-15 MED ORDER — MEPERIDINE HCL 100 MG/ML IJ SOLN
INTRAMUSCULAR | Status: DC | PRN
  Administered 2015-05-15 (×2): 25 mg via INTRAVENOUS

## 2015-05-15 NOTE — Discharge Summary (Signed)
Physician Discharge Summary  Edward Trevino [id number] DOB: 09-16-1931 DOA: 05/03/2015  PCP: Deloria Lair, MD  Admit date: 05/03/2015 Discharge date: 05/15/2015  Recommendations for Outpatient Follow-up:  1. Follow up with GI in 3-4 weeks.   Follow-up Information    Follow up with Dorris Carnes, MD.   Specialty:  Cardiology   Why:  office will contact you for appointment   Contact information:   [address] [place] [address] [phone]       Follow up with Manus Rudd, MD. Schedule an appointment as soon as possible for a visit in 1 month.   Specialty:  Gastroenterology   Contact information:   [address] [place] [address] [phone]      Discharge Diagnoses:  1. Peptic ulcer disease producing high-grade partial gastric outlet obstruction. 2. Hematemesis/UGIB secondary to severe reflux esophagitis. 3. ABLA, stable. Secondary to #2. 4. CP, suspected angina, with associated SOB, abd pain, n/v on admission. 5. Elevated troponin. 6. Orthostatic hypotension  7. AKI. 8. Chronic systolic CHF.  9. CAD, anterior STEMI 02/2015. 10. Ischemic cardiomyopathy, chronic systolic CHF, LVEF Q000111Q. 11. Hyperlipidemia.  Discharge Condition: Improved Disposition: home - refuses [facility]  Diet recommendation: Heart healthy  Filed Weights   05/03/15 1717 05/03/15 2305  Weight: 81.647 kg (180 lb) 82.555 kg (182 lb)    History of present illness:  81 yom with ischemic cardiomyopathy and ejection fraction of 20% presented to the hospital with chest pain. Had mild elevation of troponins without any clear evidence of ACS.   Hospital Course:  Seen by cardiology who felt that his chest pain may be related to angina and recommended medical management. Also treated for orthostatic hypotension and meds adjusted with d/c of lisinopril and lowering of Coreg.   Prior to discharge, he developed hematemesis and EGD revealed esophagitis as well as PUD and GOO. He  had no further bleeding and Hgb stabilized. He was made NPO for several days and underwent second EGD with dilitation of GOO, and was started on diet but could not tolerate.  UGI revealed that his GOO has improved but he now had a distal esophageal stricture. Third EGD 12/30 with esophageal dilatation with resolution in his symptoms. He is tolerating diet well. After discussion with Dr. Oneida Alar, he was cleared for discharge. To hold ASA for 7 days then restart per Dr. Oneida Alar.  Individual issues as below:  1. Peptic ulcer disease producing high-grade partial gastric outlet obstruction. S/p EGD 12/26 with dilitation. Seems to be tolerating diet now. GI performed UGI on 12/29 that revealed no GOO but distal esophageal stricture. They will perform EG/DIL 12/30. 2. Hematemesis/UGIB secondary to severe reflux esophagitis. Hgb stable. No recurrent episodes.  3. ABLA, stable. Secondary to #2. 4. CP, suspected angina, with associated SOB, abd pain, n/v on admission. Now asymptomatic. EKG consistent with prior anterior infarct per cardiology. ASA and Plavix on hold. Resume ASA when ok with GI. Do not resume Plavix.  5. Elevated troponin. Troponins flat, not consistent with ACS. EKG nonacute.  6. Orthostatic hypotension, recurrent, asymptomatic. Compression hose added. Lisinopril stopped. Coreg decreased to 3.125 BID.  7. AKI, resolved with IVF.  8. Chronic systolic CHF, stable.  9. CAD, anterior STEMI 02/2015 managed conservatively 10. Ischemic cardiomyopathy, chronic systolic CHF, LVEF Q000111Q. Titration of meds limited by low BP. 11. Hyperlipidemia, continue statin.  Consultants:  Cardiology  GI  PT- No Follow Up  Procedures:  EGD 12/26 Impression: Extensive ulceration to distal half of  esophagus along with stricture at GE junction which was dilated with the scope. Small sliding hiatal hernia. Large stomach with single piece of food debris which was removed using Roth net. Deformity to  duodenal bulb with duodenitis and 2 small ulcers and high-grade stricture. This stricture was dilated with balloon dilator to 12 mm. Scope could not be advanced distally because of very large stomach.  Antibiotics:  None  Discharge Instructions   Current Discharge Medication List    START taking these medications   Details  nitroGLYCERIN (NITRODUR - DOSED IN MG/24 HR) 0.4 mg/hr patch Place 1 patch (0.4 mg total) onto the skin daily. Qty: 30 patch, Refills: 12    pantoprazole (PROTONIX) 40 MG tablet Take 1 tablet (40 mg total) by mouth 2 (two) times daily before a meal. Qty: 60 tablet, Refills: 1      CONTINUE these medications which have CHANGED   Details  carvedilol (COREG) 6.25 MG tablet Take 0.5 tablets (3.125 mg total) by mouth 2 (two) times daily. Qty: 180 tablet, Refills: 3      CONTINUE these medications which have NOT CHANGED   Details  atorvastatin (LIPITOR) 80 MG tablet Take 1 tablet (80 mg total) by mouth daily at 6 PM. Qty: 30 tablet, Refills: 5    mirtazapine (REMERON) 30 MG tablet Take 30 mg by mouth at bedtime.    QUEtiapine (SEROQUEL) 100 MG tablet Take 100 mg by mouth at bedtime.    Venlafaxine HCl 75 MG TB24 Take 75 mg by mouth at bedtime.    nitroGLYCERIN (NITROSTAT) 0.4 MG SL tablet Place 1 tablet (0.4 mg total) under the tongue every 5 (five) minutes x 3 doses as needed for chest pain. Qty: 25 tablet, Refills: 2      STOP taking these medications     aspirin EC 81 MG EC tablet      bismuth subsalicylate (PEPTO BISMOL) 262 MG/15ML suspension      clopidogrel (PLAVIX) 75 MG tablet      lisinopril (PRINIVIL,ZESTRIL) 2.5 MG tablet        No Known Allergies  The results of significant diagnostics from this hospitalization (including imaging, microbiology, ancillary and laboratory) are listed below for reference.    Significant Diagnostic Studies: Dg Chest 2 View  05/03/2015  CLINICAL DATA:  81 year old male with chest pain EXAM: CHEST  2  VIEW COMPARISON:  Radiograph dated 02/28/2015 and 07/22/2012 FINDINGS: Two views of the chest demonstrate emphysematous changes of the lungs. There is no focal consolidation, pleural effusion, or pneumothorax. Stable small right lung base nodules noted. The cardiac silhouette is within normal limits. The osseous structures appear unremarkable. IMPRESSION: No active cardiopulmonary disease. Electronically Signed   By: Anner Crete M.D.   On: 05/03/2015 19:10   Dg Duanne Limerick W/water Sol Cm  05/14/2015  CLINICAL DATA:  Duodenal stricture post dilatation EXAM: WATER SOLUBLE UPPER GI SERIES TECHNIQUE: Single-column upper GI series was performed using water soluble contrast. CONTRAST:  150 mL Omnipaque-300 orally COMPARISON:  None FLUOROSCOPY TIME:  Radiation Exposure Index (as provided by the fluoroscopic device): Not provided If the device does not provide the exposure index: Fluoroscopy Time (in minutes and seconds):  4 minutes 12 seconds Number of Acquired Images: 2 plus multiple screen captures during fluoroscopy FINDINGS: Normal bowel gas pattern on scout image. Surgical clips RIGHT upper quadrant. Emphysematous changes at lung bases. Diffuse impairment of esophageal motility with incomplete clearance of barium by primary peristaltic waves. Numerous secondary and tertiary waves noted. Stricture at  the distal esophagus, obstructing a 12.5 mm diameter barium tablet approximately 2 vertebral body heights above the GE junction. No other esophageal abnormalities identified. Stomach is decompressed with normal rugal fold pattern. No obvious gastric mass or ulceration. No significant retained food debris. Normal emptying of contrast across the pylorus into the proximal duodenum. Mild bowel wall thickening at the duodenal bulb and proximal descending duodenum with dilatation of the descending duodenum. However contrast easily passes beyond the duodenum into nondilated proximal small bowel loops. No contrast extravasation  identified. IMPRESSION: Distal esophageal stricture obstructing a 12.5 mm diameter barium tablet 3-5 cm estimated above the gastroesophageal junction. No evidence of gastric outlet obstruction. Mild dilatation of the descending duodenum without obstruction. Electronically Signed   By: Lavonia Dana M.D.   On: 05/14/2015 16:04    Microbiology: Recent Results (from the past 240 hour(s))  KOH prep     Status: None   Collection Time: 05/07/15  2:10 PM  Result Value Ref Range Status   Specimen Description ESOPHAGUS BRUSHING  Final   Special Requests NONE  Final   KOH Prep   Final    NO YEAST OR FUNGAL ELEMENTS SEEN Performed at [facility]    Report Status 05/07/2015 FINAL  Final     Labs: Basic Metabolic Panel:  Recent Labs Lab 05/12/15 0618  NA 140  K 3.4*  CL 106  CO2 28  GLUCOSE 108*  BUN 6  CREATININE 0.90  CALCIUM 8.4*   CBC:  Recent Labs Lab 05/09/15 1033 05/10/15 1105 05/12/15 0618  WBC 7.0 6.5 6.3  HGB 10.9* 10.6* 11.0*  HCT 32.3* 31.2* 31.6*  MCV 94.2 92.6 92.7  PLT 211 204 260       Principal Problem:   Hematemesis with nausea Active Problems:   Erosive esophagitis   Chest pain with moderate risk of acute coronary syndrome   Orthostatic hypotension   Cardiomyopathy, ischemic-EF 123456   Chronic systolic CHF (congestive heart failure) (HCC)   History of STEMI-Oct 2016- conservative Rx   Dyslipidemia   Atrial fibrillation, new onset (HCC)   Emesis   Loss of weight   Poor appetite   Gastric outlet obstruction   Acute esophagitis   Gastric outflow obstruction   Peptic ulcer   Time coordinating discharge: 35 minutes  Signed:  Murray Hodgkins, MD [facility] 05/15/2015, 8:06 AM  By signing my name below, I, Rosalie Doctor attest that this documentation has been prepared under the direction and in the presence of Murray Hodgkins, MD Electronically signed: Rosalie Doctor, Scribe.  05/15/2015  I personally performed the  services described in this documentation. All medical record entries made by the scribe were at my direction. I have reviewed the chart and agree that the record reflects my personal performance and is accurate and complete. Murray Hodgkins, MD

## 2015-05-15 NOTE — Care Management Note (Signed)
Case Management Note  Patient Details  Name: Edward Trevino MRN: [id number] Date of Birth: 1931-09-30  Subjective/Objective:                    Action/Plan:   Expected Discharge Date:  05/04/15               Expected Discharge Plan:  Home/Self Care  In-House Referral:  NA  Discharge planning Services  CM Consult  Post Acute Care Choice:  NA Choice offered to:  NA  DME Arranged:    DME Agency:     HH Arranged:    [place] Agency:     Status of Service:  Completed, signed off  Medicare Important Message Given:  Yes Date Medicare IM Given:    Medicare IM give by:    Date Additional Medicare IM Given:    Additional Medicare Important Message give by:     If discussed at [place] of Stay Meetings, dates discussed:    Additional Comments: Pt having egd today and then discharge after procedure. No CM needs noted. Christinia Gully [place], RN 05/15/2015, 3:24 PM

## 2015-05-15 NOTE — Discharge Instructions (Signed)
I dilated your esophagus. You have a stricture near the base of your esophagus.  You have gastritis, A DUODENAL ULCER, AND THE DUODENAL STRICTURE. I biopsied your stomach.   FOLLOW A DYSPHAGIA 2 DIET. MEATS SHOULD BE BAKED, BROILED, OR BOILED. DO NOT EAT CHUNKS OF ANYTHING.  USE CARNATION INSTANT BREAKFAST, BOOST OR ENSURE THREE TIMES A DAY BETWEEN MEALS.   HOLD ASPIRIN.  CONTINUE PROTONIX. TAKE 30 MINUTES PRIOR TO MEALS TWICE DAILY.  YOUR BIOPSY RESULTS WILL BE AVAILABLE IN MY CHART JAN 4 AND MY OFFICE WILL CONTACT YOU IN 10-14 DAYS WITH YOUR RESULTS.   FOLLOW UP IN 1 MOS W/ DR. Gala Romney.  UPPER ENDOSCOPY AFTER CARE Read the instructions outlined below and refer to this sheet in the next week. These discharge instructions provide you with general information on caring for yourself after you leave the hospital. While your treatment has been planned according to the most current medical practices available, unavoidable complications occasionally occur. If you have any problems or questions after discharge, call DR. Zaniyah Wernette, [phone].  ACTIVITY  You may resume your regular activity, but move at a slower pace for the next 24 hours.   Take frequent rest periods for the next 24 hours.   Walking will help get rid of the air and reduce the bloated feeling in your belly (abdomen).   No driving for 24 hours (because of the medicine (anesthesia) used during the test).   You may shower.   Do not sign any important legal documents or operate any machinery for 24 hours (because of the anesthesia used during the test).    NUTRITION  Drink plenty of fluids.   You may resume your normal diet as instructed by your doctor.   Begin with a light meal and progress to your normal diet. Heavy or fried foods are harder to digest and may make you feel sick to your stomach (nauseated).   Avoid alcoholic beverages for 24 hours or as instructed.    MEDICATIONS  You may resume your normal  medications.   WHAT YOU CAN EXPECT TODAY  Some feelings of bloating in the abdomen.   Passage of more gas than usual.    IF YOU HAD A BIOPSY TAKEN DURING THE UPPER ENDOSCOPY:  Eat a soft diet IF YOU HAVE NAUSEA, BLOATING, ABDOMINAL PAIN, OR VOMITING.    FINDING OUT THE RESULTS OF YOUR TEST Not all test results are available during your visit. DR. Oneida Alar WILL CALL YOU WITHIN 14 DAYS OF YOUR PROCEDUE WITH YOUR RESULTS. Do not assume everything is normal if you have not heard from DR. Luster Hechler, CALL HER OFFICE AT [phone].  SEEK IMMEDIATE MEDICAL ATTENTION AND CALL THE OFFICE: [phone] IF:  You have more than a spotting of blood in your stool.   Your belly is swollen (abdominal distention).   You are nauseated or vomiting.  You have a temperature over 101F.   Ulcer Disease (Peptic Ulcer, Gastric Ulcer, Duodenal Ulcer) You have an ulcer. This may be in your stomach (gastric ulcer) or in the first part of your small bowel, the duodenum (duodenal ulcer). An ulcer is a break in the lining of the stomach or duodenum. The ulcer causes erosion into the deeper tissue.  CAUSES The stomach has a lining to protect itself from the acid that digests food. The lining can be damaged in two main ways:  The Helico Pylori bacteria (H. Pyolori) can infect the lining of the stomach and cause ulcers.   Nonsteroidal, anti-inflammatory medications (  NSAIDS) can cause gastric ulcerations.   Smoking tobacco can increase the acid in the stomach. This can lead to ulcers, and will impair healing of ulcers.   Other factors, such as alcohol use and stress may contribute to ulcer formation.  SYMPTOMS The problems (symptoms) of ulcer disease are usually a burning or gnawing of the mid-upper belly (abdomen). This is often worse on an empty stomach and may get better with food. This may be associated with feeling sick to your stomach (nausea), bloating, and vomiting.  HOME CARE INSTRUCTIONS Continue  regular work and usual activities unless advised otherwise by your caregiver.  Avoid tobacco, alcohol, and caffeine. Tobacco use will decrease and slow the rates of healing.   Avoid foods that seem to aggravate or cause discomfort.   Special diets are not usually needed.    ESOPHAGEAL STRICTURE  Esophageal strictures can be caused by stomach acid backing up into the tube that carries food from the mouth down to the stomach (lower esophagus).  TREATMENT There are a number of medicines used to treat reflux/stricture, including: Antacids.  Proton-pump inhibitors:PROTONIX  HOME CARE INSTRUCTIONS Eat 2-3 hours before going to bed.  Try to reach and maintain a healthy weight.  Do not eat just a few very large meals. Instead, eat 4 TO 6 smaller meals throughout the day.  Try to identify foods and beverages that make your symptoms worse, and avoid these.  Avoid tight clothing.  Do not exercise right after eating.

## 2015-05-15 NOTE — H&P (Signed)
Primary Care Physician:  Deloria Lair, MD Primary Gastroenterologist:  Dr. Oneida Alar  Pre-Procedure History & Physical: HPI:  Edward Trevino is a 81 y.o. male here for DYSPHAGIA.  Past Medical History  Diagnosis Date  . Depression   . Insomnia   . Anxiety   . Suicide attempt ([place])   . Erosive esophagitis   . Gastric out let obstruction   . HOH (hard of hearing)   . Ischemic cardiomyopathy     LVEF 20%  . ST elevation myocardial infarction (STEMI) of anterior wall [facility])     October 2016 - late presentation, managed conservatively    Past Surgical History  Procedure Laterality Date  . Appendectomy    . Back surgery    . Ercp N/A 07/24/2012    Dr. Gala Romney. Significant abnormalities of the bowl and proximal second portion producing partial gastric outlet stricture and with secondary gastric dilation and severe erosive reflux esophagitis. Biopsy showed benign ulceration. Normal-appearing ampulla, status post biliary sphincterotomy and ampulla balloon dilation, stone extraction and stent placement.  Marland Kitchen Sphincterotomy N/A 07/24/2012    Procedure: SPHINCTEROTOMY;  Surgeon: Daneil Dolin, MD;  Location: [facility];  Service: Endoscopy;  Laterality: N/A;  . Esophageal biopsy N/A 07/24/2012    Procedure: BIOPSY;  Surgeon: Daneil Dolin, MD;  Location: [facility];  Service: Endoscopy;  Laterality: N/A;  Duodenal and Esophageal Biopsies  . Esophagogastroduodenoscopy N/A 07/24/2012    [id number] ampulla s/p biliary sphincterotomy and ampullary  . Balloon dilation N/A 07/24/2012    Procedure: BALLOON DILATION;  Surgeon: Daneil Dolin, MD;  Location: [facility];  Service: Endoscopy;  Laterality: N/A;  Balloon Stone Extraction, Drudging  . Removal of stones N/A 07/24/2012    Procedure: REMOVAL OF STONES;  Surgeon: Daneil Dolin, MD;  Location: [facility];  Service: Endoscopy;  Laterality: N/A;  . Egd/ercp  10/18/2012    Dr. Gala Romney: ;yloric channel stenosis, s/p dilation and bx, persisting CBD stones with  extension of sphincterotomy and sphincterotomy balloon dilation and stone extraction. Removal of biliary stent  . Esophagogastroduodenoscopy (egd) with propofol N/A 10/18/2012    [id number] channel stenosis s/p dilation/Persisting common bile duct stones with extension of sphincterotomy     and sphincterotomy balloon dilation and stone extraction.  Biliary stent removal   . Ercp N/A 10/18/2012    Procedure: ENDOSCOPIC RETROGRADE CHOLANGIOPANCREATOGRAPHY (ERCP);  Surgeon: Daneil Dolin, MD;  Location: [facility];  Service: Endoscopy;  Laterality: N/A;  . Removal of stones N/A 10/18/2012    Procedure: REMOVAL OF STONES;  Surgeon: Daneil Dolin, MD;  Location: [facility];  Service: Endoscopy;  Laterality: N/A;  . Esophageal biopsy N/A 10/18/2012    Procedure: BIOPSY;  Surgeon: Daneil Dolin, MD;  Location: [facility];  Service: Endoscopy;  Laterality: N/A;  . Cholecystectomy N/A 10/26/2012    Procedure: LAPAROSCOPIC CHOLECYSTECTOMY;  Surgeon: Jamesetta So, MD;  Location: [facility];  Service: General;  Laterality: N/A;  . Laparoscopy N/A 10/31/2012    Procedure: LAPAROSCOPY DIAGNOSTIC;  Surgeon: Donato Heinz, MD;  Location: [facility];  Service: General;  Laterality: N/A;  . Esophagogastroduodenoscopy N/A 05/07/2015    Procedure: ESOPHAGOGASTRODUODENOSCOPY (EGD);  Surgeon: Daneil Dolin, MD;  [facility]: [facility];  Service: Endoscopy;  Laterality: N/A;  . Esophagogastroduodenoscopy N/A 05/11/2015    Procedure: ESOPHAGOGASTRODUODENOSCOPY (EGD);  Surgeon: Rogene Houston, MD;  [facility]: [facility];  Service: Endoscopy;  Laterality: N/A;  With possible pyloric channel dilation    Prior to Admission  medications   Medication Sig Start Date End Date Taking? Authorizing Provider  aspirin EC 81 MG EC tablet Take 1 tablet (81 mg total) by mouth daily. 03/04/15  Yes Brittainy Erie Noe, PA-C  atorvastatin (LIPITOR) 80 MG tablet Take 1 tablet (80 mg total) by mouth daily at 6 PM. 03/04/15  Yes Brittainy Erie Noe, PA-C   bismuth subsalicylate (PEPTO BISMOL) 262 MG/15ML suspension Take 30 mLs by mouth every 6 (six) hours as needed for indigestion.   Yes Historical Provider, MD  clopidogrel (PLAVIX) 75 MG tablet Take 1 tablet (75 mg total) by mouth daily. 03/05/15  Yes Brittainy Erie Noe, PA-C  lisinopril (PRINIVIL,ZESTRIL) 2.5 MG tablet Take 1 tablet (2.5 mg total) by mouth daily. 04/06/15  Yes Fay Records, MD  mirtazapine (REMERON) 30 MG tablet Take 30 mg by mouth at bedtime. 01/28/15  Yes Historical Provider, MD  QUEtiapine (SEROQUEL) 100 MG tablet Take 100 mg by mouth at bedtime. 01/28/15  Yes Historical Provider, MD  Venlafaxine HCl 75 MG TB24 Take 75 mg by mouth at bedtime. 01/28/15  Yes Historical Provider, MD  carvedilol (COREG) 6.25 MG tablet Take 0.5 tablets (3.125 mg total) by mouth 2 (two) times daily. 05/12/15   Kathie Dike, MD  nitroGLYCERIN (NITRODUR - DOSED IN MG/24 HR) 0.4 mg/hr patch Place 1 patch (0.4 mg total) onto the skin daily. 05/12/15   Kathie Dike, MD  nitroGLYCERIN (NITROSTAT) 0.4 MG SL tablet Place 1 tablet (0.4 mg total) under the tongue every 5 (five) minutes x 3 doses as needed for chest pain. 03/04/15   Brittainy Erie Noe, PA-C  pantoprazole (PROTONIX) 40 MG tablet Take 1 tablet (40 mg total) by mouth 2 (two) times daily before a meal. 05/12/15   Kathie Dike, MD    Allergies as of 05/03/2015  . (No Known Allergies)    Family History  Problem Relation Age of Onset  . Colon cancer Neg Hx   . Depression Sister     Social History   Social History  . Marital Status: Single    Spouse Name: N/A  . Number of Children: N/A  . Years of Education: N/A   Occupational History  . Retired    Social History Main Topics  . Smoking status: Current Every Day Smoker    Types: Cigars  . Smokeless tobacco: Never Used  . Alcohol Use: No  . Drug Use: No  . Sexual Activity: No   Other Topics Concern  . Not on file   Social History Narrative    Review of Systems: See  HPI, otherwise negative ROS   Physical Exam: BP 106/76 mmHg  Pulse 61  Temp(Src) 97.5 F (36.4 C) (Oral)  Resp 14  Ht 6\' 3"  (1.905 m)  Wt 182 lb (82.555 kg)  BMI 22.75 kg/m2  SpO2 100% General:   Alert,  pleasant and cooperative in NAD Head:  Normocephalic and atraumatic. Neck:  Supple; Lungs:  Clear throughout to auscultation.    Heart:  Regular rate and rhythm. Abdomen:  Soft, nontender and nondistended. Normal bowel sounds, without guarding, and without rebound.   Neurologic:  Alert and  oriented x4;  grossly normal neurologically.  Impression/Plan:    DYSPHAGIA  PLAN:  EGD/DIL TODAY

## 2015-05-15 NOTE — Progress Notes (Signed)
Discharged PT per MD order and protocol. Reviewed discharge teaching and handouts given. Pt verbalized understanding and left with all belongings. Prescriptions explained.  VSS. IV catheter D/C.  Patient wheeled down by staff member. Damiyah Ditmars J Everett, RN  

## 2015-05-15 NOTE — Progress Notes (Signed)
PROGRESS NOTE  Edward Trevino [id number] DOB: [birth year] DOA: 05/03/2015 PCP: Deloria Lair, MD  Summary: 41 yom with ischemic cardiomyopathy and ejection fraction of 20% presented to the hospital with chest pain. Had mild elevation of troponins without any clear evidence of ACS. Seen by cardiology who felt that his chest pain may be related to angina and recommended medical management. Developed hematemesis and EGD revealed esophagitis as well as PUD and GOO. NPO for now, GI plans to advance diet 1-2 days and EGD 12/26 to dilate GOO.  Assessment/Plan: 1. PUD producing high-grade partial gastric outlet obstruction. S/p EGD 12/26 with dilitation. GI performed UGI on 12/29 that revealed no GOO but distal esophageal stricture. They will perform EG/DIL today.  2. Hematemesis/UGIB secondary to severe reflux esophagitis. Hgb stable. No recurrent episodes.  3. ABLA, stable. Secondary to #2. 4. CP, suspected angina, with associated SOB, abd pain, n/v on admission. asymptomatic. EKG consistent with prior anterior infarct per cardiology. ASA and Plavix on hold. Resume ASA when ok with GI. Do not resume Plavix.  5. Elevated troponin. Troponins flat, not consistent with ACS. EKG nonacute.  6. Orthostatic hypotension, recurrent, asymptomatic. Compression hose added. Lisinopril stopped. Coreg decreased to 3.125 BID.  7. AKI, resolved with IVF.  8. Chronic systolic CHF, stable.   9. CAD, anterior STEMI 02/2015 managed conservatively 10. Ischemic cardiomyopathy, chronic systolic CHF, LVEF [id number]. Titration of meds limited by low BP. 11. Hyperlipidemia, continue statin.   Overall improved. Tolerating diet.  GI recommends withholding ASA (high bleeding risk) until f/u EGD as an outpatient.  Potentially home later today s/p EGD  Code Status: Full DVT prophylaxis: Lovenox Family Communication: No family at bedside. Disposition Plan: Home, refuses HH  Murray Hodgkins, MD  [facility]    Pager [phone] If 7PM-7AM, please contact night-coverage at [email or web address], password [facility] 05/15/2015, 7:22 AM  LOS: 10 days    Consultants:  Cardiology  GI  PT- No Follow Up  Procedures:  EGD 12/26 Impression: Extensive ulceration to distal half of esophagus along with stricture at GE junction which was dilated with the scope. Small sliding hiatal hernia. Large stomach with single piece of food debris which was removed using Roth net. Deformity to duodenal bulb with duodenitis and 2 small ulcers and high-grade stricture. This stricture was dilated with balloon dilator to 12 mm. Scope could not be advanced distally because of very large stomach.  Antibiotics:  none   HPI/Subjective: Feels good. Denies any nausea, vomiting, or CP. Is having normal BMs.   Objective: Filed Vitals:   05/14/15 1018 05/14/15 1300 05/14/15 2139 05/15/15 0519  BP:  116/72 97/52 104/61  Pulse:  74 68 74  Temp:  98.4 F (36.9 C) 97.8 F (36.6 C) 98 F (36.7 C)  TempSrc:  Oral Oral Oral  Resp:  20 18 20   Height:      Weight:      SpO2: 95% 98% 100% 98%    Intake/Output Summary (Last 24 hours) at 05/15/15 0722 Last data filed at 05/14/15 1300  Gross per 24 hour  Intake    120 ml  Output      0 ml  Net    120 ml     Filed Weights   05/03/15 1717 05/03/15 2305  Weight: 81.647 kg (180 lb) 82.555 kg (182 lb)    Exam:    VSS, afebrile General:  Appears calm and comfortable Cardiovascular: RRR, no m/r/g. No LE edema. Respiratory: CTA bilaterally, no w/r/r. Normal respiratory effort. Abdomen:  soft, ntnd Psychiatric: grossly normal mood and affect, speech fluent and appropriate   Scheduled Meds: . carvedilol  3.125 mg Oral BID WC  . nitroGLYCERIN  0.4 mg Transdermal Daily  . ondansetron (ZOFRAN) IV  4 mg Intravenous TID WC & HS  . pantoprazole  40 mg Oral BID AC  . polyethylene glycol  17 g Oral BID   Continuous Infusions: . sodium chloride 10 mL/hr at 05/11/15 1205  . sodium  chloride      Principal Problem:   Hematemesis with nausea Active Problems:   Erosive esophagitis   Chest pain with moderate risk of acute coronary syndrome   Orthostatic hypotension   Cardiomyopathy, ischemic-EF [id number]   Chronic systolic CHF (congestive heart failure) (HCC)   History of STEMI-Oct 2016- conservative Rx   Dyslipidemia   Atrial fibrillation, new onset (HCC)   Emesis   Loss of weight   Poor appetite   Gastric outlet obstruction   Acute esophagitis   Gastric outflow obstruction   Peptic ulcer     By signing my name below, I, Edward Trevino attest that this documentation has been prepared under the direction and in the presence of Murray Hodgkins, MD Electronically signed: Rosalie Trevino, Scribe.  05/15/2015 12:19pm     I personally performed the services described in this documentation. All medical record entries made by the scribe were at my direction. I have reviewed the chart and agree that the record reflects my personal performance and is accurate and complete. Murray Hodgkins, MD

## 2015-05-15 NOTE — Op Note (Signed)
[facility] [address] [place], [address]   ENDOSCOPY PROCEDURE REPORT  PATIENT: Edward Trevino, Edward Trevino  MR#: [id number] BIRTHDATE: 04-14-1932 , 43  yrs. old GENDER: male  ENDOSCOPIST: Danie Binder, MD REFFERED [id number] Tapper, M.D.  PROCEDURE DATE:  May 27, 2015 PROCEDURE:   EGD with biopsy, EGD with dilatation over guidewire, and EGD with balloon dilatation  INDICATIONS:1.  dysphagia.   2.  VOMITING. MEDICATIONS: Demerol 50 mg IV and Versed 4 mg IV TOPICAL ANESTHETIC: Viscous Xylocaine  DESCRIPTION OF PROCEDURE:   After the risks benefits and alternatives of the procedure were thoroughly explained, informed consent was obtained.  The [id number]  endoscope was introduced through the mouth and advanced to the second portion of the duodenum. The instrument was slowly withdrawn as the mucosa was carefully examined.  Prior to withdrawal of the scope, the guidwire was placed.  The esophagus was dilated successfully.  The patient was recovered in endoscopy and discharged home in satisfactory condition. Estimated blood loss is zero unless otherwise noted in this procedure report.    ESOPHAGUS: ESOPHAGITIS BEGINNING 30 CM FROM THE TEETH AND EXTENDING TO THE GE JUNCTION.  DISTAL ESOPHAGEAL STRICTURE.   STOMACH: Mild non-erosive gastritis (inflammation) was found in the gastric antrum.  Multiple biopsies were performed using cold forceps. STRICTURE AT THE JUNCTION OF D1/D2 NARROWING LUMEN TO 10-11 MM. BALLOON DILATION FROM 12 TO 15 MM.  EACH SETTING HELD FOR ONE MINUTE.  ABLE TO PASS ADULT GASTROSCOPE AFTER DILATION.  NORMAL SECOND PORTION OF THE DUODENUM.   DUODENUM: A small clean-based ulcer was found in the duodenal bulb.   Dilation was then performed at the gastroesphageal junction  Dilator: Savary over guidewire Size(s): 11-14 MM Resistance: minimal Heme: none COMPLICATIONS: There were no immediate complications.  ENDOSCOPIC IMPRESSION: 1.    ESOPHAGITIS 2.   MILD Non-erosive gastritis 3.   ONE DUODENAL ULCER AND STRICTURE AT THE JUNCTION OF D1/D2  RECOMMENDATIONS: FOLLOW A DYSPHAGIA 2 DIET. USE CARNATION INSTANT BREAKFAST, BOOST OR ENSURE THREE TIMES A DAY BETWEEN MEALS. HOLD ASPIRIN. PROTONIX 30 MINUTES PRIOR TO MEALS TWICE DAILY. AWAIT BIOPSY RESULTS FOLLOW UP IN 1 MOS W/ DR.  Gala Romney.   eSigned:  Danie Binder, MD 27-May-2015 2:39 PM   CPT CODES: ICD CODES:  The ICD and CPT codes recommended by this software are interpretations from the data that the clinical staff has captured with the software.  The verification of the translation of this report to the ICD and CPT codes and modifiers is the sole responsibility of the health care institution and practicing physician where this report was generated.  [employer] will not be held responsible for the validity of the ICD and CPT codes included on this report.  AMA assumes no liability for data contained or not contained herein. CPT is a Designer, television/film set of the [employer].

## 2015-05-16 NOTE — Progress Notes (Signed)
Pt had a prescription of Xanax sent to pharmacy at admission. Pt left before getting Xanax from Pharmacy. RN Called pt's daughter to inform her and see if she could come and pick it up. No answer. RN left a message. Pharmacist aware and said the prescription is sealed and awaiting in the pharmacy. Oswald Hillock, RN

## 2015-05-19 ENCOUNTER — Encounter (HOSPITAL_COMMUNITY): Payer: Self-pay | Admitting: Gastroenterology

## 2015-05-20 ENCOUNTER — Emergency Department (HOSPITAL_COMMUNITY): Payer: Medicare Other

## 2015-05-20 ENCOUNTER — Encounter (HOSPITAL_COMMUNITY): Payer: Self-pay | Admitting: Internal Medicine

## 2015-05-20 ENCOUNTER — Inpatient Hospital Stay (HOSPITAL_COMMUNITY)
Admission: EM | Admit: 2015-05-20 | Discharge: 2015-05-25 | DRG: 381 | Disposition: A | Discharge status: Home / Self Care | Payer: Medicare Other | Attending: Internal Medicine | Admitting: Internal Medicine

## 2015-05-20 DIAGNOSIS — Z9049 Acquired absence of other specified parts of digestive tract: Secondary | ICD-10-CM

## 2015-05-20 DIAGNOSIS — N210 Calculus in bladder: Secondary | ICD-10-CM | POA: Diagnosis present

## 2015-05-20 DIAGNOSIS — F17290 Nicotine dependence, other tobacco product, uncomplicated: Secondary | ICD-10-CM | POA: Diagnosis present

## 2015-05-20 DIAGNOSIS — G4700 Insomnia, unspecified: Secondary | ICD-10-CM | POA: Diagnosis present

## 2015-05-20 DIAGNOSIS — Z818 Family history of other mental and behavioral disorders: Secondary | ICD-10-CM

## 2015-05-20 DIAGNOSIS — K208 Other esophagitis: Secondary | ICD-10-CM | POA: Diagnosis not present

## 2015-05-20 DIAGNOSIS — R748 Abnormal levels of other serum enzymes: Secondary | ICD-10-CM

## 2015-05-20 DIAGNOSIS — I5022 Chronic systolic (congestive) heart failure: Secondary | ICD-10-CM | POA: Diagnosis not present

## 2015-05-20 DIAGNOSIS — K263 Acute duodenal ulcer without hemorrhage or perforation: Secondary | ICD-10-CM | POA: Diagnosis not present

## 2015-05-20 DIAGNOSIS — I252 Old myocardial infarction: Secondary | ICD-10-CM | POA: Diagnosis not present

## 2015-05-20 DIAGNOSIS — I255 Ischemic cardiomyopathy: Secondary | ICD-10-CM | POA: Diagnosis not present

## 2015-05-20 DIAGNOSIS — K315 Obstruction of duodenum: Secondary | ICD-10-CM | POA: Diagnosis present

## 2015-05-20 DIAGNOSIS — R1310 Dysphagia, unspecified: Secondary | ICD-10-CM | POA: Diagnosis not present

## 2015-05-20 DIAGNOSIS — K5989 Other specified functional intestinal disorders: Secondary | ICD-10-CM

## 2015-05-20 DIAGNOSIS — K311 Adult hypertrophic pyloric stenosis: Secondary | ICD-10-CM | POA: Diagnosis present

## 2015-05-20 DIAGNOSIS — F32A Depression, unspecified: Secondary | ICD-10-CM | POA: Diagnosis present

## 2015-05-20 DIAGNOSIS — R1013 Epigastric pain: Secondary | ICD-10-CM | POA: Diagnosis not present

## 2015-05-20 DIAGNOSIS — R112 Nausea with vomiting, unspecified: Secondary | ICD-10-CM | POA: Diagnosis not present

## 2015-05-20 DIAGNOSIS — F418 Other specified anxiety disorders: Secondary | ICD-10-CM | POA: Diagnosis present

## 2015-05-20 DIAGNOSIS — F329 Major depressive disorder, single episode, unspecified: Secondary | ICD-10-CM | POA: Diagnosis not present

## 2015-05-20 DIAGNOSIS — K2210 Ulcer of esophagus without bleeding: Secondary | ICD-10-CM | POA: Diagnosis present

## 2015-05-20 DIAGNOSIS — D649 Anemia, unspecified: Secondary | ICD-10-CM | POA: Diagnosis not present

## 2015-05-20 DIAGNOSIS — Z8711 Personal history of peptic ulcer disease: Secondary | ICD-10-CM

## 2015-05-20 DIAGNOSIS — K269 Duodenal ulcer, unspecified as acute or chronic, without hemorrhage or perforation: Secondary | ICD-10-CM | POA: Diagnosis present

## 2015-05-20 DIAGNOSIS — K449 Diaphragmatic hernia without obstruction or gangrene: Secondary | ICD-10-CM | POA: Diagnosis not present

## 2015-05-20 DIAGNOSIS — R109 Unspecified abdominal pain: Secondary | ICD-10-CM | POA: Diagnosis not present

## 2015-05-20 DIAGNOSIS — K279 Peptic ulcer, site unspecified, unspecified as acute or chronic, without hemorrhage or perforation: Secondary | ICD-10-CM | POA: Diagnosis not present

## 2015-05-20 DIAGNOSIS — H9190 Unspecified hearing loss, unspecified ear: Secondary | ICD-10-CM | POA: Diagnosis present

## 2015-05-20 DIAGNOSIS — K222 Esophageal obstruction: Secondary | ICD-10-CM | POA: Diagnosis not present

## 2015-05-20 DIAGNOSIS — K210 Gastro-esophageal reflux disease with esophagitis: Secondary | ICD-10-CM | POA: Diagnosis not present

## 2015-05-20 DIAGNOSIS — R1110 Vomiting, unspecified: Secondary | ICD-10-CM | POA: Diagnosis not present

## 2015-05-20 DIAGNOSIS — K598 Other specified functional intestinal disorders: Secondary | ICD-10-CM

## 2015-05-20 HISTORY — DX: Adult hypertrophic pyloric stenosis: K31.1

## 2015-05-20 HISTORY — DX: Calculus of bile duct without cholangitis or cholecystitis with obstruction: K80.51

## 2015-05-20 HISTORY — DX: Esophageal obstruction: K22.2

## 2015-05-20 HISTORY — DX: Calculus in bladder: N21.0

## 2015-05-20 HISTORY — DX: Duodenitis without bleeding: K29.80

## 2015-05-20 HISTORY — DX: Duodenitis with bleeding: K29.81

## 2015-05-20 LAB — COMPREHENSIVE METABOLIC PANEL
ALBUMIN: 3.9 g/dL (ref 3.5–5.0)
ALT: 30 U/L (ref 17–63)
AST: 36 U/L (ref 15–41)
Alkaline Phosphatase: 308 U/L — ABNORMAL HIGH (ref 38–126)
Anion gap: 9 (ref 5–15)
BUN: 14 mg/dL (ref 6–20)
CHLORIDE: 108 mmol/L (ref 101–111)
CO2: 26 mmol/L (ref 22–32)
Calcium: 9 mg/dL (ref 8.9–10.3)
Creatinine, Ser: 1.19 mg/dL (ref 0.61–1.24)
GFR calc Af Amer: >60 mL/min (ref >60)
GFR calc non Af Amer: 55 mL/min — ABNORMAL LOW (ref >60)
GLUCOSE: 127 mg/dL — AB (ref 65–99)
POTASSIUM: 3.8 mmol/L (ref 3.5–5.1)
Sodium: 143 mmol/L (ref 135–145)
Total Bilirubin: 0.5 mg/dL (ref 0.3–1.2)
Total Protein: 7.3 g/dL (ref 6.5–8.1)

## 2015-05-20 LAB — BRAIN NATRIURETIC PEPTIDE: B NATRIURETIC PEPTIDE 5: 232 pg/mL — AB (ref 0.0–100.0)

## 2015-05-20 LAB — CBC WITH DIFFERENTIAL/PLATELET
BASOS PCT: 1 %
Basophils Absolute: 0 10*3/uL (ref 0.0–0.1)
EOS ABS: 0.2 10*3/uL (ref 0.0–0.7)
Eosinophils Relative: 3 %
HCT: 37.8 % — ABNORMAL LOW (ref 39.0–52.0)
HEMOGLOBIN: 12.3 g/dL — AB (ref 13.0–17.0)
Lymphocytes Relative: 26 %
Lymphs Abs: 1.7 10*3/uL (ref 0.7–4.0)
MCH: 31.1 pg (ref 26.0–34.0)
MCHC: 32.5 g/dL (ref 30.0–36.0)
MCV: 95.7 fL (ref 78.0–100.0)
Monocytes Absolute: 0.5 10*3/uL (ref 0.1–1.0)
Monocytes Relative: 7 %
NEUTROS ABS: 4.2 10*3/uL (ref 1.7–7.7)
NEUTROS PCT: 63 %
Platelets: 273 10*3/uL (ref 150–400)
RBC: 3.95 MIL/uL — AB (ref 4.22–5.81)
RDW: 13.5 % (ref 11.5–15.5)
WBC: 6.7 10*3/uL (ref 4.0–10.5)

## 2015-05-20 LAB — POC OCCULT BLOOD, ED: FECAL OCCULT BLD: NEGATIVE

## 2015-05-20 LAB — LACTIC ACID, PLASMA: LACTIC ACID, VENOUS: 1.2 mmol/L (ref 0.5–2.0)

## 2015-05-20 LAB — I-STAT CG4 LACTIC ACID, ED: Lactic Acid, Venous: 1.55 mmol/L (ref 0.5–2.0)

## 2015-05-20 LAB — LIPASE, BLOOD: Lipase: 26 U/L (ref 11–51)

## 2015-05-20 LAB — TROPONIN I: Troponin I: 0.03 ng/mL (ref <0.031)

## 2015-05-20 MED ORDER — NITROGLYCERIN 0.4 MG SL SUBL: 0.4000 mg | SUBLINGUAL_TABLET | SUBLINGUAL | Status: DC | PRN

## 2015-05-20 MED ORDER — MORPHINE SULFATE (PF) 2 MG/ML IV SOLN
1.0000 mg | INTRAVENOUS | Status: DC | PRN
  Administered 2015-05-20 – 2015-05-21 (×3): 1 mg via INTRAVENOUS
  Filled 2015-05-20 (×3): qty 1

## 2015-05-20 MED ORDER — ALBUTEROL SULFATE (2.5 MG/3ML) 0.083% IN NEBU: 2.5000 mg | INHALATION_SOLUTION | RESPIRATORY_TRACT | Status: DC | PRN

## 2015-05-20 MED ORDER — NITROGLYCERIN 0.4 MG/HR TD PT24
0.4000 mg | MEDICATED_PATCH | Freq: Every day | TRANSDERMAL | Status: DC
  Administered 2015-05-21 – 2015-05-25 (×5): 0.4 mg via TRANSDERMAL
  Filled 2015-05-20 (×7): qty 1

## 2015-05-20 MED ORDER — METOPROLOL TARTRATE 1 MG/ML IV SOLN
2.5000 mg | Freq: Two times a day (BID) | INTRAVENOUS | Status: DC
  Administered 2015-05-20 – 2015-05-23 (×6): 2.5 mg via INTRAVENOUS
  Filled 2015-05-20 (×6): qty 5

## 2015-05-20 MED ORDER — ONDANSETRON HCL 4 MG/2ML IJ SOLN
4.0000 mg | Freq: Once | INTRAMUSCULAR | Status: AC
  Administered 2015-05-20: 4 mg via INTRAVENOUS
  Filled 2015-05-20: qty 2

## 2015-05-20 MED ORDER — PANTOPRAZOLE SODIUM 40 MG IV SOLR
40.0000 mg | Freq: Two times a day (BID) | INTRAVENOUS | Status: DC
  Administered 2015-05-20 – 2015-05-23 (×6): 40 mg via INTRAVENOUS
  Filled 2015-05-20 (×6): qty 40

## 2015-05-20 MED ORDER — ONDANSETRON HCL 4 MG/2ML IJ SOLN
4.0000 mg | Freq: Four times a day (QID) | INTRAMUSCULAR | Status: DC
  Administered 2015-05-20 – 2015-05-25 (×19): 4 mg via INTRAVENOUS
  Filled 2015-05-20 (×19): qty 2

## 2015-05-20 MED ORDER — PANTOPRAZOLE SODIUM 40 MG IV SOLR
40.0000 mg | Freq: Once | INTRAVENOUS | Status: AC
  Administered 2015-05-20: 40 mg via INTRAVENOUS
  Filled 2015-05-20: qty 40

## 2015-05-20 MED ORDER — PROMETHAZINE HCL 12.5 MG PO TABS: 6.2500 mg | ORAL_TABLET | Freq: Four times a day (QID) | ORAL | Status: DC | PRN

## 2015-05-20 MED ORDER — KCL IN DEXTROSE-NACL 20-5-0.9 MEQ/L-%-% IV SOLN
INTRAVENOUS | Status: DC
  Administered 2015-05-20 – 2015-05-22 (×2): via INTRAVENOUS
  Administered 2015-05-22: 1 mL via INTRAVENOUS
  Administered 2015-05-23: 1000 mL via INTRAVENOUS
  Administered 2015-05-23: 10 mL/h via INTRAVENOUS
  Administered 2015-05-25: 65 mL/h via INTRAVENOUS
  Filled 2015-05-20 (×2): qty 1000

## 2015-05-20 MED ORDER — GLUCAGON HCL RDNA (DIAGNOSTIC) 1 MG IJ SOLR
1.0000 mg | Freq: Once | INTRAMUSCULAR | Status: AC
  Administered 2015-05-20: 1 mg via INTRAVENOUS
  Filled 2015-05-20: qty 1

## 2015-05-20 MED ORDER — LORAZEPAM 2 MG/ML IJ SOLN
0.5000 mg | Freq: Every day | INTRAMUSCULAR | Status: DC
  Administered 2015-05-20 – 2015-05-21 (×2): 0.5 mg via INTRAVENOUS
  Filled 2015-05-20 (×3): qty 1

## 2015-05-20 MED ORDER — SODIUM CHLORIDE 0.9 % IV SOLN
Freq: Once | INTRAVENOUS | Status: AC
  Administered 2015-05-20: 13:00:00 via INTRAVENOUS

## 2015-05-20 NOTE — ED Notes (Signed)
MD at bedside. 

## 2015-05-20 NOTE — ED Notes (Addendum)
Attempted report. Charge RN reported would call back.

## 2015-05-20 NOTE — ED Notes (Signed)
Patient is resting comfortably. 

## 2015-05-20 NOTE — ED Notes (Addendum)
Report given to Jinny Blossom, RN for room 321

## 2015-05-20 NOTE — ED Provider Notes (Signed)
CSN: [id number]     Arrival date & time 05/20/15  1141 History  By signing my name below, I, Irene Pap, attest that this documentation has been prepared under the direction and in the presence of Ezequiel Essex, MD. Electronically Signed: Irene Pap, ED Scribe. 05/20/2015. 12:08 PM.  Chief Complaint  Patient presents with  . Emesis   The history is provided by the patient and a relative. No language interpreter was used.  HPI Comments: Edward Trevino is a 82 y.o. male with a hx of MI, erosive esophagitis, and ischemic cardiomyopathy who presents to the Emergency Department complaining of emesis onset one hour ago. Family states that she made the pt eggs one hour ago and pt was unable to eat the food. He began to throw up clear fluids. He states that he only ate a sandwich yesterday, but did not have any of his other symptoms that he has today. Relative states that she switched him to a low salt diet and has lost 20 lbs in a month. He reports associated trouble swallowing secretions, epigastric chest pain and abdominal pain that he rates 8/10 that radiates up to his throat. Pt last took his daily medications last night. He denies that the pain feels like his prior MI. Pt was discharged from the hospital 5 days ago for a bowel stricture. He denies hx of abdominal surgeries, HTN, DM, back pain, neck pain, hematemesis or hematochezia. Pt is not on blood thinners. Pt has an appointment with Dr. Sydell Axon, gastroenterologist, on 06/15/15.   PCP: Deloria Lair, MD  Past Medical History  Diagnosis Date  . Depression   . Insomnia   . Anxiety   . Suicide attempt ([place])   . Erosive esophagitis   . Gastric out let obstruction   . HOH (hard of hearing)   . Ischemic cardiomyopathy     LVEF 20%  . ST elevation myocardial infarction (STEMI) of anterior wall [facility])     October 2016 - late presentation, managed conservatively   Past Surgical History  Procedure Laterality Date  . Appendectomy    . Back  surgery    . Ercp N/A 07/24/2012    Dr. Gala Romney. Significant abnormalities of the bowl and proximal second portion producing partial gastric outlet stricture and with secondary gastric dilation and severe erosive reflux esophagitis. Biopsy showed benign ulceration. Normal-appearing ampulla, status post biliary sphincterotomy and ampulla balloon dilation, stone extraction and stent placement.  Marland Kitchen Sphincterotomy N/A 07/24/2012    Procedure: SPHINCTEROTOMY;  Surgeon: Daneil Dolin, MD;  Location: [facility];  Service: Endoscopy;  Laterality: N/A;  . Esophageal biopsy N/A 07/24/2012    Procedure: BIOPSY;  Surgeon: Daneil Dolin, MD;  Location: [facility];  Service: Endoscopy;  Laterality: N/A;  Duodenal and Esophageal Biopsies  . Esophagogastroduodenoscopy N/A 07/24/2012    [id number] ampulla s/p biliary sphincterotomy and ampullary  . Balloon dilation N/A 07/24/2012    Procedure: BALLOON DILATION;  Surgeon: Daneil Dolin, MD;  Location: [facility];  Service: Endoscopy;  Laterality: N/A;  Balloon Stone Extraction, Drudging  . Removal of stones N/A 07/24/2012    Procedure: REMOVAL OF STONES;  Surgeon: Daneil Dolin, MD;  Location: [facility];  Service: Endoscopy;  Laterality: N/A;  . Egd/ercp  10/18/2012    Dr. Gala Romney: ;yloric channel stenosis, s/p dilation and bx, persisting CBD stones with extension of sphincterotomy and sphincterotomy balloon dilation and stone extraction. Removal of biliary stent  . Esophagogastroduodenoscopy (egd) with propofol N/A 10/18/2012  [id number] channel stenosis s/p dilation/Persisting common bile duct stones with extension of sphincterotomy     and sphincterotomy balloon dilation and stone extraction.  Biliary stent removal   . Ercp N/A 10/18/2012    Procedure: ENDOSCOPIC RETROGRADE CHOLANGIOPANCREATOGRAPHY (ERCP);  Surgeon: Daneil Dolin, MD;  Location: [facility];  Service: Endoscopy;  Laterality: N/A;  . Removal of stones N/A 10/18/2012    Procedure: REMOVAL OF STONES;  Surgeon: Daneil Dolin, MD;  Location: [facility];  Service: Endoscopy;  Laterality: N/A;  . Esophageal biopsy N/A 10/18/2012    Procedure: BIOPSY;  Surgeon: Daneil Dolin, MD;  [facility]: [facility];  Service: Endoscopy;  Laterality: N/A;  . Cholecystectomy N/A 10/26/2012    Procedure: LAPAROSCOPIC CHOLECYSTECTOMY;  Surgeon: Jamesetta So, MD;  Location: [facility];  Service: General;  Laterality: N/A;  . Laparoscopy N/A 10/31/2012    Procedure: LAPAROSCOPY DIAGNOSTIC;  Surgeon: Donato Heinz, MD;  Location: [facility];  Service: General;  Laterality: N/A;  . Esophagogastroduodenoscopy N/A 05/07/2015    Procedure: ESOPHAGOGASTRODUODENOSCOPY (EGD);  Surgeon: Daneil Dolin, MD;  [facility];  Service: Endoscopy;  Laterality: N/A;  . Esophagogastroduodenoscopy N/A 05/11/2015    Procedure: ESOPHAGOGASTRODUODENOSCOPY (EGD);  Surgeon: Rogene Houston, MD;  [facility];  Service: Endoscopy;  Laterality: N/A;  With possible pyloric channel dilation  . Esophagogastroduodenoscopy N/A 05/15/2015    Procedure: ESOPHAGOGASTRODUODENOSCOPY (EGD);  Surgeon: Danie Binder, MD;  [facility];  Service: Endoscopy;  Laterality: N/A;  . Esophageal dilation N/A 05/15/2015    Procedure: ESOPHAGEAL DILATION;  Surgeon: Danie Binder, MD;  [facility];  Service: Endoscopy;  Laterality: N/A;   Family History  Problem Relation Age of Onset  . Colon cancer Neg Hx   . Depression Sister    Social History  Substance Use Topics  . Smoking status: Current Every Day Smoker    Types: Cigars  . Smokeless tobacco: Never Used  . Alcohol Use: No    Review of Systems A complete 10 system review of systems was obtained and all systems are negative except as noted in the HPI and PMH.   Allergies  Review of patient's allergies indicates no known allergies.  Home Medications   Prior to Admission medications   Medication Sig Start Date End Date Taking? Authorizing Provider  atorvastatin (LIPITOR) 80 MG  tablet Take 1 tablet (80 mg total) by mouth daily at 6 PM. 03/04/15   Brittainy M Rosita Fire, PA-C  carvedilol (COREG) 6.25 MG tablet Take 0.5 tablets (3.125 mg total) by mouth 2 (two) times daily. 05/12/15   Kathie Dike, MD  mirtazapine (REMERON) 30 MG tablet Take 30 mg by mouth at bedtime. 01/28/15   Historical Provider, MD  nitroGLYCERIN (NITRODUR - DOSED IN MG/24 HR) 0.4 mg/hr patch Place 1 patch (0.4 mg total) onto the skin daily. 05/12/15   Kathie Dike, MD  nitroGLYCERIN (NITROSTAT) 0.4 MG SL tablet Place 1 tablet (0.4 mg total) under the tongue every 5 (five) minutes x 3 doses as needed for chest pain. 03/04/15   Brittainy Erie Noe, PA-C  pantoprazole (PROTONIX) 40 MG tablet Take 1 tablet (40 mg total) by mouth 2 (two) times daily before a meal. 05/12/15   Kathie Dike, MD  QUEtiapine (SEROQUEL) 100 MG tablet Take 100 mg by mouth at bedtime. 01/28/15   Historical Provider, MD  Venlafaxine HCl 75 MG TB24 Take 75 mg by mouth at bedtime. 01/28/15   Historical Provider, MD  BP 116/67 mmHg  Pulse 88  Temp(Src) 97.5 F (36.4 C) (Oral)  Resp 18  Ht 6\' 2"  (1.88 m)  Wt 177 lb (80.287 kg)  BMI 22.72 kg/m2  SpO2 99% Physical Exam  Constitutional: He is oriented to person, place, and time. He appears well-developed and well-nourished. No distress.  Uncomfortable, spitting into emesis bag  HENT:  Head: Normocephalic and atraumatic.  Mouth/Throat: Oropharynx is clear and moist. No oropharyngeal exudate.  Controlling secretions, no tongue or lip swelling  Eyes: Conjunctivae and EOM are normal. Pupils are equal, round, and reactive to light.  Neck: Normal range of motion. Neck supple.  No meningismus.  Cardiovascular: Normal rate, regular rhythm, normal heart sounds and intact distal pulses.   No murmur heard. Pulmonary/Chest: Effort normal and breath sounds normal. No respiratory distress.  Abdominal: Soft. There is tenderness in the epigastric area. There is no rebound and no guarding.   Genitourinary:  Rectal exam has no gross blood  Musculoskeletal: Normal range of motion. He exhibits no edema or tenderness.  Neurological: He is alert and oriented to person, place, and time. No cranial nerve deficit. He exhibits normal muscle tone. Coordination normal.  No ataxia on finger to nose bilaterally. No pronator drift. 5/5 strength throughout. CN 2-12 intact.Equal grip strength. Sensation intact.   Skin: Skin is warm.  Psychiatric: He has a normal mood and affect. His behavior is normal.  Nursing note and vitals reviewed.   ED Course  Procedures (including critical care time) DIAGNOSTIC STUDIES: Oxygen Saturation is 99% on RA, normal by my interpretation.    COORDINATION OF CARE: 12:12 PM-Discussed treatment plan which includes labs and EKG with pt at bedside and pt agreed to plan.    Labs Review Labs Reviewed  CBC WITH DIFFERENTIAL/PLATELET  COMPREHENSIVE METABOLIC PANEL  LIPASE, BLOOD  TROPONIN I  I-STAT CG4 LACTIC ACID, ED  POC OCCULT BLOOD, ED    Imaging Review No results found. I have personally reviewed and evaluated these images and lab results as part of my medical decision-making.   EKG Interpretation   Date/Time:  Wednesday May 20 2015 12:04:18 EST Ventricular Rate:  72 PR Interval:  201 QRS Duration: 94 QT Interval:  385 QTC Calculation: 421 R Axis:   -84 Text Interpretation:  Sinus rhythm Anterolateral infarct, old Baseline  wander in lead(s) III lateral T waves now upright Confirmed by Wyvonnia Dusky   MD, Kiel Cockerell [phone]) on 05/20/2015 12:20:41 PM    me:  Wednesday May 20 2015 12:04:18 EST Ventricular Rate:  72 PR Interval:  201 QRS Duration: 94 QT Interval:  385 QTC Calculation: 421 R Axis:   -84 Text Interpretation:  Sinus rhythm Anterolateral infarct, old Baseline  wander in lead(s) III lateral T waves now upright Confirmed by Wyvonnia Dusky   MD, [place] [phone]) on 05/20/2015 12:20:41 PM      MDM   Final diagnoses:  None   Patient  with nausea and vomiting for the past one hour with epigastric pain radiating to his chest. No hematemesis. Recent complicated hospitalization with esophageal erosions and dilation for strictures.  EGD with extensive ulceration to distal half of esophagus, stricture at GE junction s/p dilation, deformity of duodenal bulb with duodenitis and 2 small ulcers with high grade stricture s/p balloon dilation.  Labs appear to be at baseline.  LFTs and lipase normal. Troponin negative.  Concern for possible food impaction versus recurrent stricture.  Unable to tolerate PO.  D/w Dr. Laural Golden.  He will evaluate for dilation.  Agrees  with IV PPI and requests hospitalist admission.  Vitals remain stable in the ED.  Airway patent.  Admission d/w Dr. Caryn Section.  I personally performed the services described in this documentation, which was scribed in my presence. The recorded information has been reviewed and is accurate.   Ezequiel Essex, MD 05/20/15 1359

## 2015-05-20 NOTE — ED Notes (Addendum)
Pt comes in with emesis and diarrhea starting today. Pt was recently in the hospital for obstruction, per daughter. NAD noted upon triage. Daughter denies blood in his emesis.

## 2015-05-20 NOTE — H&P (Addendum)
[facility] History and Physical  Edward Trevino [id number] DOB: [birth year] DOA: 05/20/2015  Referring physician: ED Dr. Wyvonnia Dusky PCP: Deloria Lair, MD   Chief Complaint: Nausea and vomiting.  HPI: Edward Trevino is a 82 y.o. male with a history of ischemic cardiomyopathy with ejection fraction of 20%, non-ST elevation myocardial infarction in October 2006, and peptic ulcer disease. He was recently hospitalized from 05/03/2015 through 05/15/2015 for upper GI bleed secondary to severe reflux esophagitis. Per EGD on 03/07/15, the patient was noted to have severe erosive esophagitis, gastric outlet obstruction, duodenitis, and 2 small duodenal ulcers. There was also a GE junction stricture which was dilated. He presents today with several episodes of nausea and vomiting which started this morning. Apparently he ate ice cream and cake last night with no difficulty. However this morning, he had multiple episodes of nausea/vomiting. There was no evidence of coffee grounds emesis or bright red blood in his emesis. It appeared that he vomited Ensure that he had drunk last night. He also complains of difficulty swallowing secretions this morning along with epigastric abdominal pain that radiates to his chest. At its worse, the pain a an 8/10 in intensity. He does not equate this pain with pain that he had when he suffered an MI. He denied missing taking Protonix except for this morning when he was unable to swallow his medications without vomiting.  In the ED, he is afebrile and hemodynamically stable. His lab data are significant for normal lipase and normal LFTs with exception of modestly elevated alkaline phosphatase. His stool is guaiac negative. His acute abdominal series revealed no evidence of cardiopulmonary process or bowel obstruction. His troponin I is 0.03. He is being admitted for further evaluation and management.    Review of Systems:  As above in history present illness. In addition,  his review of systems is positive for intentional weight-loss of approximately 20 pounds in the past month or 2. Otherwise review systems is negative.  Past Medical History  Diagnosis Date  . Depression   . Insomnia   . Anxiety   . Suicide attempt ([place])   . Erosive esophagitis   . Gastric out let obstruction   . HOH (hard of hearing)   . Ischemic cardiomyopathy     LVEF 20%  . ST elevation myocardial infarction (STEMI) of anterior wall [facility])     October 2016 - late presentation, managed conservatively  . Gastric outlet obstruction 05/07/2015  . Multiple duodenal ulcers 05/11/15    2  were present per EGD.  . Duodenitis 05/11/15  . Esophageal stricture 05/11/15    Dilated  . Choledocholithiasis with obstruction 07/25/2012   Past Surgical History  Procedure Laterality Date  . Appendectomy    . Back surgery    . Ercp N/A 07/24/2012    Dr. Gala Romney. Significant abnormalities of the bowl and proximal second portion producing partial gastric outlet stricture and with secondary gastric dilation and severe erosive reflux esophagitis. Biopsy showed benign ulceration. Normal-appearing ampulla, status post biliary sphincterotomy and ampulla balloon dilation, stone extraction and stent placement.  Marland Kitchen Sphincterotomy N/A 07/24/2012    Procedure: SPHINCTEROTOMY;  Surgeon: Daneil Dolin, MD;  Location: [facility];  Service: Endoscopy;  Laterality: N/A;  . Esophageal biopsy N/A 07/24/2012    Procedure: BIOPSY;  Surgeon: Daneil Dolin, MD;  Location: [facility];  Service: Endoscopy;  Laterality: N/A;  Duodenal and Esophageal Biopsies  . Esophagogastroduodenoscopy N/A 07/24/2012    [id number] ampulla s/p biliary sphincterotomy and ampullary  .  Balloon dilation N/A 07/24/2012    Procedure: BALLOON DILATION;  Surgeon: Daneil Dolin, MD;  Location: [facility];  Service: Endoscopy;  Laterality: N/A;  Balloon Stone Extraction, Drudging  . Removal of stones N/A 07/24/2012    Procedure: REMOVAL OF STONES;   Surgeon: Daneil Dolin, MD;  Location: [facility];  Service: Endoscopy;  Laterality: N/A;  . Egd/ercp  10/18/2012    Dr. Gala Romney: ;yloric channel stenosis, s/p dilation and bx, persisting CBD stones with extension of sphincterotomy and sphincterotomy balloon dilation and stone extraction. Removal of biliary stent  . Esophagogastroduodenoscopy (egd) with propofol N/A 10/18/2012    [id number] channel stenosis s/p dilation/Persisting common bile duct stones with extension of sphincterotomy     and sphincterotomy balloon dilation and stone extraction.  Biliary stent removal   . Ercp N/A 10/18/2012    Procedure: ENDOSCOPIC RETROGRADE CHOLANGIOPANCREATOGRAPHY (ERCP);  Surgeon: Daneil Dolin, MD;  Location: [facility];  Service: Endoscopy;  Laterality: N/A;  . Removal of stones N/A 10/18/2012    Procedure: REMOVAL OF STONES;  Surgeon: Daneil Dolin, MD;  Location: [facility];  Service: Endoscopy;  Laterality: N/A;  . Esophageal biopsy N/A 10/18/2012    Procedure: BIOPSY;  Surgeon: Daneil Dolin, MD;  [facility]: [facility];  Service: Endoscopy;  Laterality: N/A;  . Cholecystectomy N/A 10/26/2012    Procedure: LAPAROSCOPIC CHOLECYSTECTOMY;  Surgeon: Jamesetta So, MD;  Location: [facility];  Service: General;  Laterality: N/A;  . Laparoscopy N/A 10/31/2012    Procedure: LAPAROSCOPY DIAGNOSTIC;  Surgeon: Donato Heinz, MD;  Location: [facility];  Service: General;  Laterality: N/A;  . Esophagogastroduodenoscopy N/A 05/07/2015    Procedure: ESOPHAGOGASTRODUODENOSCOPY (EGD);  Surgeon: Daneil Dolin, MD;  [facility];  Service: Endoscopy;  Laterality: N/A;  . Esophagogastroduodenoscopy N/A 05/11/2015    Procedure: ESOPHAGOGASTRODUODENOSCOPY (EGD);  Surgeon: Rogene Houston, MD;  [facility];  Service: Endoscopy;  Laterality: N/A;  With possible pyloric channel dilation  . Esophagogastroduodenoscopy N/A 05/15/2015    Procedure: ESOPHAGOGASTRODUODENOSCOPY (EGD);  Surgeon: Danie Binder, MD;  [facility];   Service: Endoscopy;  Laterality: N/A;  . Esophageal dilation N/A 05/15/2015    Procedure: ESOPHAGEAL DILATION;  Surgeon: Danie Binder, MD;  [facility];  Service: Endoscopy;  Laterality: N/A;   Social History: He is widowed. He lives alone. He has a sitter 5 days per week. His son, Konrad Dolores lives close by within walking distance. Patient reports that he has been smoking Cigars.  He has never used smokeless tobacco. He reports that he does not drink alcohol or use illicit drugs.  No Known Allergies  Family History  Problem Relation Age of Onset  . Colon cancer Neg Hx   . Depression Sister     Prior to Admission medications   Medication Sig Start Date End Date Taking? Authorizing Provider  atorvastatin (LIPITOR) 80 MG tablet Take 1 tablet (80 mg total) by mouth daily at 6 PM. 03/04/15  Yes Brittainy Erie Noe, PA-C  carvedilol (COREG) 6.25 MG tablet Take 0.5 tablets (3.125 mg total) by mouth 2 (two) times daily. 05/12/15  Yes Kathie Dike, MD  ENSURE PLUS (ENSURE PLUS) LIQD Take 237 mLs by mouth.   Yes Historical Provider, MD  mirtazapine (REMERON) 30 MG tablet Take 30 mg by mouth at bedtime. 01/28/15  Yes Historical Provider, MD  nitroGLYCERIN (NITRODUR - DOSED IN MG/24 HR) 0.4 mg/hr patch Place 1 patch (0.4 mg total) onto the skin daily.  05/12/15  Yes Kathie Dike, MD  pantoprazole (PROTONIX) 40 MG tablet Take 1 tablet (40 mg total) by mouth 2 (two) times daily before a meal. 05/12/15  Yes Kathie Dike, MD  QUEtiapine (SEROQUEL) 100 MG tablet Take 100 mg by mouth at bedtime. 01/28/15  Yes Historical Provider, MD  Venlafaxine HCl 75 MG TB24 Take 75 mg by mouth at bedtime. 01/28/15  Yes Historical Provider, MD  nitroGLYCERIN (NITROSTAT) 0.4 MG SL tablet Place 1 tablet (0.4 mg total) under the tongue every 5 (five) minutes x 3 doses as needed for chest pain. 03/04/15   Brittainy Erie Noe, PA-C   Physical Exam: Filed Vitals:   05/20/15 1500 05/20/15 1530 05/20/15 1600 05/20/15  1700  BP: 119/88 116/79 125/76 123/72  Pulse: 70 69 69 73  Temp:      TempSrc:      Resp: 17 18 15 16   Height:      Weight:      SpO2: 100% 100% 100% 100%    Wt Readings from Last 3 Encounters:  05/20/15 80.287 kg (177 lb)  05/15/15 82.555 kg (182 lb)  04/06/15 85.73 kg (189 lb)    General:  Appears calm and comfortable; elderly 82 year old Caucasian man in no acute distress. Eyes: PERRL, normal lids, irises & conjunctiva; conjunctivae are clear and sclerae are white. ENT: grossly normal hearing, lips & tongue; mucous membranes are mildly dry without pharyngeal exudates or erythema. No teeth present. Neck: no LAD, masses or thyromegaly Cardiovascular: S1, S2, with a soft systolic murmur. No pedal edema. Telemetry: SR, no arrhythmias  Respiratory: CTA bilaterally, no w/r/r. Normal respiratory effort. Abdomen: Positive bowel sounds, soft, mild to moderate tenderness in the epigastrium, no distention or masses palpated. Rectal per EDP revealed no gross blood. Skin: no rash or induration seen on limited exam Musculoskeletal: grossly normal tone BUE/BLE; no acute hot red joints. Psychiatric: Flat affect, speech is clear, alert and oriented 3. Neurologic: grossly non-focal; cranial nerves II through XII are grossly intact.           Labs on Admission:  Basic Metabolic Panel:  Recent Labs Lab 05/20/15 1210  NA 143  K 3.8  CL 108  CO2 26  GLUCOSE 127*  BUN 14  CREATININE 1.19  CALCIUM 9.0   Liver Function Tests:  Recent Labs Lab 05/20/15 1210  AST 36  ALT 30  ALKPHOS 308*  BILITOT 0.5  PROT 7.3  ALBUMIN 3.9    Recent Labs Lab 05/20/15 1210  LIPASE 26   No results for input(s): AMMONIA in the last 168 hours. CBC:  Recent Labs Lab 05/20/15 1210  WBC 6.7  NEUTROABS 4.2  HGB 12.3*  HCT 37.8*  MCV 95.7  PLT 273   Cardiac Enzymes:  Recent Labs Lab 05/20/15 1210  TROPONINI 0.03    BNP (last 3 results)  Recent Labs  02/28/15 1430  05/20/15 1210  BNP 531.1* 232.0*    ProBNP (last 3 results) No results for input(s): PROBNP in the last 8760 hours.  CBG: No results for input(s): GLUCAP in the last 168 hours.  Radiological Exams on Admission: Dg Abd Acute W/chest  05/20/2015  CLINICAL DATA:  Nausea, vomiting, and abdominal pain. Recent bowel obstruction. EXAM: DG ABDOMEN ACUTE W/ 1V CHEST COMPARISON:  Chest radiographs 05/03/2015. CTA chest, abdomen, and pelvis 02/27/2015. FINDINGS: The cardiomediastinal silhouette is unchanged. The lungs remain hyperinflated with calcified right lower lung nodules and small calcified right hilar lymph nodes. Biapical scarring is again seen. There is  no evidence of acute airspace consolidation, edema, pleural effusion, or pneumothorax. Degenerative changes are noted at the left greater than right acromioclavicular joints. There is no evidence of intraperitoneal free air. Residual radiographic contrast material is noted in the rectum and descending colon. A small to moderate amount of colonic stool is present. There is a paucity of small bowel gas without dilated loops seen to clearly indicate obstruction. Small calcifications in the pelvis likely represent phleboliths. Right upper quadrant surgical clips are noted. No acute osseous abnormality is identified. IMPRESSION: 1. No evidence of acute cardiopulmonary process. 2. No evidence of bowel obstruction. Electronically Signed   By: Logan Bores M.D.   On: 05/20/2015 13:29    EKG: Independently reviewed. Nonacute.  Assessment/Plan Principal Problem:   Nausea and vomiting Active Problems:   Gastric out let obstruction   Dysphagia   Duodenal ulcer   Erosive esophagitis   Esophageal stricture   Cardiomyopathy, ischemic-EF [id number]   Chronic systolic CHF (congestive heart failure) (HCC)   Depression   39. 82 year old with presentation of nausea, vomiting, and probable dysphagia. In light of his recent hospitalization and EGD which revealed 2  duodenal ulcers, high-grade stricture with gastric outlet obstruction, esophageal stricture and severe erosive esophagitis, I am concerned about worsening peptic ulcer disease or gastric outlet obstruction. There was no reported hematemesis. (He has still been off of Plavix and aspirin from the previous hospitalization). His abdominal x-ray revealed no evidence of small bowel obstruction. His chronic systolic heart failure appears to be compensated with no evidence of pulmonary edema on chest x-ray or peripheral edema.   Plan: 1. The patient was given a glucagon injection and IV Protonix in the ED. 2. Will continue IV Protonix every 12 hours. We'll order Zofran scheduled IV every 6 hours. 3. We'll keep the patient nothing by mouth with exception of an occasional ice chip. 4. Gastroenterology has been consulted. I briefly discussed the patient with Dr. Laural Golden. He agreed with medical management and will defer further evaluation to Dr. Gala Romney. 5. Will hold carvedilol and start IV metoprolol. 6. Will hold his psychotropic medications, but will give scheduled Ativan daily at bedtime for chronic insomnia. We'll need to restart venlafaxine and Seroquel as soon as he is able to take medications by mouth. 7. Will start gentle IV fluids with D5 normal saline at 70 cc an hour.    Code Status: Full code as discussed with his son Tommy/POA. DVT Prophylaxis: SCDs Family Communication: Discussed with son Konrad Dolores Disposition Plan: Anticipate discharge in the next 48 hours.  Time spent: One hour  [facility] Pager [phone]

## 2015-05-21 ENCOUNTER — Encounter (HOSPITAL_COMMUNITY): Payer: Self-pay | Admitting: Gastroenterology

## 2015-05-21 ENCOUNTER — Inpatient Hospital Stay (HOSPITAL_COMMUNITY): Payer: Medicare Other

## 2015-05-21 ENCOUNTER — Telehealth: Payer: Self-pay | Admitting: Gastroenterology

## 2015-05-21 DIAGNOSIS — K269 Duodenal ulcer, unspecified as acute or chronic, without hemorrhage or perforation: Secondary | ICD-10-CM

## 2015-05-21 DIAGNOSIS — R748 Abnormal levels of other serum enzymes: Secondary | ICD-10-CM | POA: Diagnosis present

## 2015-05-21 DIAGNOSIS — K208 Other esophagitis: Secondary | ICD-10-CM

## 2015-05-21 DIAGNOSIS — D649 Anemia, unspecified: Secondary | ICD-10-CM | POA: Diagnosis present

## 2015-05-21 DIAGNOSIS — K315 Obstruction of duodenum: Secondary | ICD-10-CM | POA: Diagnosis present

## 2015-05-21 DIAGNOSIS — R1110 Vomiting, unspecified: Secondary | ICD-10-CM

## 2015-05-21 DIAGNOSIS — R1310 Dysphagia, unspecified: Secondary | ICD-10-CM

## 2015-05-21 DIAGNOSIS — I255 Ischemic cardiomyopathy: Secondary | ICD-10-CM

## 2015-05-21 DIAGNOSIS — K222 Esophageal obstruction: Secondary | ICD-10-CM

## 2015-05-21 LAB — CBC
HEMATOCRIT: 33.1 % — AB (ref 39.0–52.0)
HEMOGLOBIN: 10.9 g/dL — AB (ref 13.0–17.0)
MCH: 31.7 pg (ref 26.0–34.0)
MCHC: 32.9 g/dL (ref 30.0–36.0)
MCV: 96.2 fL (ref 78.0–100.0)
Platelets: 224 10*3/uL (ref 150–400)
RBC: 3.44 MIL/uL — AB (ref 4.22–5.81)
RDW: 13.6 % (ref 11.5–15.5)
WBC: 6 10*3/uL (ref 4.0–10.5)

## 2015-05-21 LAB — BASIC METABOLIC PANEL
ANION GAP: 6 (ref 5–15)
BUN: 10 mg/dL (ref 6–20)
CO2: 25 mmol/L (ref 22–32)
CREATININE: 1.11 mg/dL (ref 0.61–1.24)
Calcium: 8.5 mg/dL — ABNORMAL LOW (ref 8.9–10.3)
Chloride: 111 mmol/L (ref 101–111)
GFR calc non Af Amer: >60 mL/min (ref >60)
Glucose, Bld: 85 mg/dL (ref 65–99)
POTASSIUM: 4.6 mmol/L (ref 3.5–5.1)
Sodium: 142 mmol/L (ref 135–145)

## 2015-05-21 MED ORDER — IOHEXOL 300 MG/ML  SOLN
100.0000 mL | Freq: Once | INTRAMUSCULAR | Status: AC | PRN
  Administered 2015-05-21: 100 mL via INTRAVENOUS

## 2015-05-21 NOTE — Progress Notes (Signed)
Patient not able to tolerate clear liquid diet tonight. Nausea,and vomiting,after meal..Will continue to monitor patient.Marland Kitchen

## 2015-05-21 NOTE — Care Management Note (Signed)
Case Management Note  Patient Details  Name: Edward Trevino MRN: [id number] Date of Birth: 1932/02/21  Subjective/Objective:                  Pt admitted from home with nausea and vomiting. Pt lives alone and has a son and daughter who are very active in the care of the pt. Pt is independent with ADL's.  Action/Plan: No CM needs noted.  Expected Discharge Date:  05/22/15               Expected Discharge Plan:  Home/Self Care  In-House Referral:  NA  Discharge planning Services  CM Consult  Post Acute Care Choice:  NA Choice offered to:  NA  DME Arranged:    DME Agency:     HH Arranged:    HH Agency:     Status of Service:  Completed, signed off  Medicare Important Message Given:    Date Medicare IM Given:    Medicare IM give by:    Date Additional Medicare IM Given:    Additional Medicare Important Message give by:     If discussed at [place] of Stay Meetings, dates discussed:    Additional Comments:  Joylene Draft, RN 05/21/2015, 2:08 PM

## 2015-05-21 NOTE — Telephone Encounter (Signed)
PT ADMITTED JAN 4. PLEASE LET HIM KNOW THAT HIS STOMACH BIOSPY SHOWS GASTRITIS DUE TO ASA USE.

## 2015-05-21 NOTE — Progress Notes (Signed)
[facility] PROGRESS NOTE  RADFORD RIVERA [id number] DOB: 1932-02-07 DOA: 05/20/2015 PCP: Deloria Lair, MD    Code Status: Full code. Family Communication: Discussed with son on 05/20/15 Disposition Plan: Discharge when clinically appropriate and following appropriate evaluation, possibly in the next couple days.   Consultants:  Gastroenterology  Procedures:  None  Antibiotics:  None  HPI/Subjective: Patient reports having a bowel movement last night or early this morning. He denies black tarry stools or bright red blood per rectum. He denies abdominal pain, but says that his abdomen is "sore". He denies nausea vomiting since admission. He denies chest pain or chest congestion.   Objective: Filed Vitals:   05/20/15 2259 05/21/15 0605  BP: 103/64 112/64  Pulse: 63 65  Temp: 98.7 F (37.1 C) 98.3 F (36.8 C)  Resp: 16 16   oxygen saturation 100% on room air.  Intake/Output Summary (Last 24 hours) at 05/21/15 1203 Last data filed at 05/21/15 0900  Gross per 24 hour  Intake      0 ml  Output      0 ml  Net      0 ml   Filed Weights   05/20/15 1143 05/20/15 1845 05/21/15 0605  Weight: 80.287 kg (177 lb) 79.379 kg (175 lb) 79.153 kg (174 lb 8 oz)    Exam:   General:  alert 82 year old man in no acute distress.  Cardiovascular: S1, S2, with a soft systolic murmur.  Respiratory: Clear to auscultation bilaterally.  Abdomen: positive bowel sounds, soft, minimal epigastric tenderness without rebound, guarding, or distention.  Musculoskeletal/extremities: No pedal edema. No pretibial edema.  Neurologic/psychiatric: He has a flat affect, but he is alert and oriented 3. His speech is clear.   Data Reviewed: Basic Metabolic Panel:  Recent Labs Lab 05/20/15 1210 05/21/15 0555  NA 143 142  K 3.8 4.6  CL 108 111  CO2 26 25  GLUCOSE 127* 85  BUN 14 10  CREATININE 1.19 1.11  CALCIUM 9.0 8.5*   Liver Function Tests:  Recent Labs Lab  05/20/15 1210  AST 36  ALT 30  ALKPHOS 308*  BILITOT 0.5  PROT 7.3  ALBUMIN 3.9    Recent Labs Lab 05/20/15 1210  LIPASE 26   No results for input(s): AMMONIA in the last 168 hours. CBC:  Recent Labs Lab 05/20/15 1210 05/21/15 0555  WBC 6.7 6.0  NEUTROABS 4.2  --   HGB 12.3* 10.9*  HCT 37.8* 33.1*  MCV 95.7 96.2  PLT 273 224   Cardiac Enzymes:  Recent Labs Lab 05/20/15 1210  TROPONINI 0.03   BNP (last 3 results)  Recent Labs  02/28/15 1430 05/20/15 1210  BNP 531.1* 232.0*    ProBNP (last 3 results) No results for input(s): PROBNP in the last 8760 hours.  CBG: No results for input(s): GLUCAP in the last 168 hours.  No results found for this or any previous visit (from the past 240 hour(s)).   Studies: Dg Abd Acute W/chest  05/20/2015  CLINICAL DATA:  Nausea, vomiting, and abdominal pain. Recent bowel obstruction. EXAM: DG ABDOMEN ACUTE W/ 1V CHEST COMPARISON:  Chest radiographs 05/03/2015. CTA chest, abdomen, and pelvis 02/27/2015. FINDINGS: The cardiomediastinal silhouette is unchanged. The lungs remain hyperinflated with calcified right lower lung nodules and small calcified right hilar lymph nodes. Biapical scarring is again seen. There is no evidence of acute airspace consolidation, edema, pleural effusion, or pneumothorax. Degenerative changes are noted at the left greater than right acromioclavicular joints. There is no  evidence of intraperitoneal free air. Residual radiographic contrast material is noted in the rectum and descending colon. A small to moderate amount of colonic stool is present. There is a paucity of small bowel gas without dilated loops seen to clearly indicate obstruction. Small calcifications in the pelvis likely represent phleboliths. Right upper quadrant surgical clips are noted. No acute osseous abnormality is identified. IMPRESSION: 1. No evidence of acute cardiopulmonary process. 2. No evidence of bowel obstruction. Electronically  Signed   By: Logan Bores M.D.   On: 05/20/2015 13:29    Scheduled Meds: . LORazepam  0.5 mg Intravenous QHS  . metoprolol  2.5 mg Intravenous Q12H  . nitroGLYCERIN  0.4 mg Transdermal Daily  . ondansetron (ZOFRAN) IV  4 mg Intravenous 4 times per day  . pantoprazole (PROTONIX) IV  40 mg Intravenous Q12H   Continuous Infusions: . dextrose 5 % and 0.9 % NaCl with KCl 20 mEq/L 70 mL/hr at 05/20/15 1904   Assessment and plan:  Principal Problem:   Nausea and vomiting Active Problems:   Gastric out let obstruction   Dysphagia   Duodenal ulcer   Erosive esophagitis   Esophageal stricture   Cardiomyopathy, ischemic-EF [id number]   Chronic systolic CHF (congestive heart failure) (HCC)   Depression   Duodenal stricture   Abnormal serum level of alkaline phosphatase   Normocytic anemia   1. Nausea with vomiting and probable dysphagia in a patient with a recent history of gastric outlet obstruction, esophageal stricture, erosive esophagitis, and duodenal stricture and duodenal ulcers.  The patient was started on IV Protonix every 12 hours. He was made to be virtually nothing by mouth with exception of occasional ice chips. Gentle IV fluids were started. -GI was consulted. GI PA, Ms. Bobby Rumpf evaluated the patient and is awaiting conversation with Dr. Gala Romney. The tentative plan may be CT imaging of his abdomen and pelvis. -Patient appears to be comfortable.  Chronic systolic congestive heart failure/ischemic cardiomyopathy with an EF of 20%. Patient is being continued on beta blocker, but is being given metoprolol IV. His heart failure appears to be compensated. -We'll continue gentle IV fluids as he is nothing by mouth.  Normocytic anemia. Patient's hemoglobin was 12.3 on admission. His hemoglobin has fallen to 10.9, likely from the dilutional effects of IV fluids. -No indication for transfusion yet. We'll continue to monitor.  Depression with anxiety and insomnia. Venlafaxine and Seroquel  on hold. These will need to be started as soon as tolerated. In the meantime, will continue daily at bedtime IV lorazepam.   Time spent: 35 minutes    [facility] Pager [phone]. If 7PM-7AM, please contact night-coverage at [email or web address], password [email or web address] 05/21/2015, 12:03 PM  LOS: 1 day

## 2015-05-21 NOTE — Consult Note (Signed)
Referring Provider: Rexene Alberts, MD Primary Care Physician:  Deloria Lair, MD Primary Gastroenterologist:  Garfield Cornea, MD  Reason for Consultation:  Recurrent N/V/dysphagia with h/o esophageal stricture and PUD with GOO  HPI: Edward Trevino is a 82 y.o. male with NSTEM in 02/2015 with large anterior MI, late presentation treated with medical therapy, LVEF 20% started on Plavix/ASA, recent admission 05/03/15 - 05/15/15 who presents with recurrent N/V and dysphagia. Last issue and he had 3 endoscopies.   EGD 05/07/2015 by Dr. Gala Romney revealing severe esophagitis, retained food in the stomach with incomplete exam, pyloric channel/duodenal bulb abnormality with at least 1 cm ulcer with significant friability and high-grade gastric outlet obstruction. Patient was on Plavix and aspirin therefore dilation not attempted.   EGD by Dr. Laural Golden on 05/11/2015 off of Plavix and aspirin showed distal esophageal ulcers circumferential distal 3-4 cm, soft stricture dilated with scope, diffuse erythema and edema to the bulbar mucosa with 2 small ulcers of the distal segment and a high-grade stricture in the middle, scope could not be passed. This was dilated with balloon up to 12 mm. Scope still could not be passed beyond this segment but due to scope used up in the large stomach.   UGI series 05/14/15: Distal esophageal stricture obstructing 12.5 mm tablet, no gastric outlet obstruction, mild dilation descending duodenum without obstruction.  EGD by Dr. Oneida Alar 05/15/2015 showed esophagitis and stricture, esophageal stricture dilated from 11-14 mm balloon, benign gastritis with no H. pylori, stricture at D1/D2 Nehring ligamenta 1011 mm dilated with a balloon up to 15 mm. Past scope, normal downstream. Small bulbar ulcer.  Patient presented back to the hospital with multiple episodes of nausea and vomiting the day of admission. No evidence of hematemesis or coffee-ground emesis. Complained of difficulty  swallowing and epigastric pain radiating into the chest. He has been on Protonix twice a day. He has been off of his Plavix and aspirin since last hospitalization. Labs stable with mild anemia and otherwise have been unremarkable except for elevated alkaline phosphatase in the 300 range, increased since last admission. Acute abdominal series showing a small to moderate amount of colonic stool, no obstruction, no acute process. Patient states no current melena, brbpr but states he had brbpr during last admission. Continues to have poor appetite. States he is unable to swallow anything solid and has been drinking some Ensure. Complains of fatigue. Denies chest pain.  Prior to Admission medications   Medication Sig Start Date End Date Taking? Authorizing Provider  atorvastatin (LIPITOR) 80 MG tablet Take 1 tablet (80 mg total) by mouth daily at 6 PM. 03/04/15  Yes Brittainy Erie Noe, PA-C  carvedilol (COREG) 6.25 MG tablet Take 0.5 tablets (3.125 mg total) by mouth 2 (two) times daily. 05/12/15  Yes Kathie Dike, MD  ENSURE PLUS (ENSURE PLUS) LIQD Take 237 mLs by mouth.   Yes Historical Provider, MD  mirtazapine (REMERON) 30 MG tablet Take 30 mg by mouth at bedtime. 01/28/15  Yes Historical Provider, MD  nitroGLYCERIN (NITRODUR - DOSED IN MG/24 HR) 0.4 mg/hr patch Place 1 patch (0.4 mg total) onto the skin daily. 05/12/15  Yes Kathie Dike, MD  pantoprazole (PROTONIX) 40 MG tablet Take 1 tablet (40 mg total) by mouth 2 (two) times daily before a meal. 05/12/15  Yes Kathie Dike, MD  QUEtiapine (SEROQUEL) 100 MG tablet Take 100 mg by mouth at bedtime. 01/28/15  Yes Historical Provider, MD  Venlafaxine HCl 75 MG TB24 Take 75 mg by mouth at bedtime. 01/28/15  Yes Historical Provider, MD  nitroGLYCERIN (NITROSTAT) 0.4 MG SL tablet Place 1 tablet (0.4 mg total) under the tongue every 5 (five) minutes x 3 doses as needed for chest pain. 03/04/15   Brittainy Erie Noe, PA-C    Current Facility-Administered  Medications  Medication Dose Route Frequency Provider Last Rate Last Dose  . albuterol (PROVENTIL) (2.5 MG/3ML) 0.083% nebulizer solution 2.5 mg  2.5 mg Nebulization Q2H PRN Rexene Alberts, MD      . dextrose 5 % and 0.9 % NaCl with KCl 20 mEq/L infusion   Intravenous Continuous Rexene Alberts, MD 70 mL/hr at 05/20/15 1904    . LORazepam (ATIVAN) injection 0.5 mg  0.5 mg Intravenous QHS Rexene Alberts, MD   0.5 mg at 05/20/15 2213  . metoprolol (LOPRESSOR) injection 2.5 mg  2.5 mg Intravenous Q12H Rexene Alberts, MD   2.5 mg at 05/20/15 2214  . morphine 2 MG/ML injection 1 mg  1 mg Intravenous Q2H PRN Rexene Alberts, MD   1 mg at 05/20/15 2219  . nitroGLYCERIN (NITRODUR - Dosed in mg/24 hr) patch 0.4 mg  0.4 mg Transdermal Daily Rexene Alberts, MD   0.4 mg at 05/20/15 1800  . nitroGLYCERIN (NITROSTAT) SL tablet 0.4 mg  0.4 mg Sublingual Q5 Min x 3 PRN Rexene Alberts, MD      . ondansetron [facility]) injection 4 mg  4 mg Intravenous 4 times per day Rexene Alberts, MD   4 mg at 05/21/15 0608  . pantoprazole (PROTONIX) injection 40 mg  40 mg Intravenous Q12H Rexene Alberts, MD   40 mg at 05/20/15 2213  . promethazine (PHENERGAN) tablet 6.25 mg  6.25 mg Oral Q6H PRN Rexene Alberts, MD        Allergies as of 05/20/2015  . (No Known Allergies)    Past Medical History  Diagnosis Date  . Depression   . Insomnia   . Anxiety   . Suicide attempt ([place])   . Erosive esophagitis   . Gastric out let obstruction   . HOH (hard of hearing)   . Ischemic cardiomyopathy     LVEF 20%  . ST elevation myocardial infarction (STEMI) of anterior wall [facility])     October 2016 - late presentation, managed conservatively  . Gastric outlet obstruction 05/07/2015  . Multiple duodenal ulcers 05/11/15    2  were present per EGD.  . Duodenitis 05/11/15  . Esophageal stricture 05/11/15    Dilated  . Choledocholithiasis with obstruction 07/25/2012    Past Surgical History  Procedure Laterality Date  . Appendectomy    . Back  surgery    . Ercp N/A 07/24/2012    Dr. Gala Romney. Significant abnormalities of the bowl and proximal second portion producing partial gastric outlet stricture and with secondary gastric dilation and severe erosive reflux esophagitis. Biopsy showed benign ulceration. Normal-appearing ampulla, status post biliary sphincterotomy and ampulla balloon dilation, stone extraction and stent placement.  Marland Kitchen Sphincterotomy N/A 07/24/2012    Procedure: SPHINCTEROTOMY;  Surgeon: Daneil Dolin, MD;  Location: [facility];  Service: Endoscopy;  Laterality: N/A;  . Esophageal biopsy N/A 07/24/2012    Procedure: BIOPSY;  Surgeon: Daneil Dolin, MD;  Location: [facility];  Service: Endoscopy;  Laterality: N/A;  Duodenal and Esophageal Biopsies  . Esophagogastroduodenoscopy N/A 07/24/2012    [id number] ampulla s/p biliary sphincterotomy and ampullary  . Balloon dilation N/A 07/24/2012    Procedure: BALLOON DILATION;  Surgeon: Daneil Dolin, MD;  Location: [facility];  Service: Endoscopy;  Laterality: N/A;  Balloon Stone Extraction, Drudging  . Removal of stones N/A 07/24/2012    Procedure: REMOVAL OF STONES;  Surgeon: Daneil Dolin, MD;  Location: [facility];  Service: Endoscopy;  Laterality: N/A;  . Egd/ercp  10/18/2012    Dr. Gala Romney: ;yloric channel stenosis, s/p dilation and bx, persisting CBD stones with extension of sphincterotomy and sphincterotomy balloon dilation and stone extraction. Removal of biliary stent  . Esophagogastroduodenoscopy (egd) with propofol N/A 10/18/2012    [id number] channel stenosis s/p dilation/Persisting common bile duct stones with extension of sphincterotomy     and sphincterotomy balloon dilation and stone extraction.  Biliary stent removal   . Ercp N/A 10/18/2012    Procedure: ENDOSCOPIC RETROGRADE CHOLANGIOPANCREATOGRAPHY (ERCP);  Surgeon: Daneil Dolin, MD;  Location: [facility];  Service: Endoscopy;  Laterality: N/A;  . Removal of stones N/A 10/18/2012    Procedure: REMOVAL OF STONES;  Surgeon: Daneil Dolin, MD;  Location: [facility];  Service: Endoscopy;  Laterality: N/A;  . Esophageal biopsy N/A 10/18/2012    Procedure: BIOPSY;  Surgeon: Daneil Dolin, MD;  Location: [facility];  Service: Endoscopy;  Laterality: N/A;  . Cholecystectomy N/A 10/26/2012    Procedure: LAPAROSCOPIC CHOLECYSTECTOMY;  Surgeon: Jamesetta So, MD;  Location: [facility];  Service: General;  Laterality: N/A;  . Laparoscopy N/A 10/31/2012    Procedure: LAPAROSCOPY DIAGNOSTIC;  Surgeon: Donato Heinz, MD;  Location: [facility];  Service: General;  Laterality: N/A;  . Esophagogastroduodenoscopy N/A 05/07/2015    RMR: Severe esophagitis, retained food in the stomach precluded complete exam, pyloric channel/duodenal bulb abnormal with 1 cm ulcer, friability with high-grade gastric outlet obstruction. No dilation performed on Plavix and aspirin.  . Esophagogastroduodenoscopy N/A 05/11/2015    Rehman: Distal esophageal ulcers with circumferential ulceration at distal 3-4 cm, soft stricture dilated with scope, diffuse erythema and edema in the bulbar mucosa with 2 small ulcers in the distal segment and high-grade stricture in the middle, scope could not be passed. Dilated up to 12 mm. Could not see duodenum beyond this point because the scope was retained in the large stomach.  . Esophagogastroduodenoscopy N/A 05/15/2015    SLF: Esophagitis with stricture, dilated GE junction stricture up to 14 mm with the balloon, D1/D2 stricture with narrowing of the lumen to 10-11 mm, dilated up to 15 mm with balloon. Downstream duodenum appear normal. Small duodenal bulbar ulcer. Biopsies from the stomach benign gastritis, no H pylori.  . Esophageal dilation N/A 05/15/2015    Procedure: ESOPHAGEAL DILATION;  Surgeon: Danie Binder, MD;  [facility];  Service: Endoscopy;  Laterality: N/A;    Family History  Problem Relation Age of Onset  . Colon cancer Neg Hx   . Depression Sister     Social History   Social History  . Marital Status:  Single    Spouse Name: N/A  . Number of Children: N/A  . Years of Education: N/A   Occupational History  . Retired    Social History Main Topics  . Smoking status: Current Every Day Smoker    Types: Cigars  . Smokeless tobacco: Never Used  . Alcohol Use: No  . Drug Use: No  . Sexual Activity: No   Other Topics Concern  . Not on file   Social History Narrative     ROS:  General: see hpi. Eyes: Negative for vision changes.  ENT: Negative for hoarseness,  nasal congestion. See hpi CV: Negative for chest pain, angina, palpitations, dyspnea on  exertion, peripheral edema.  Respiratory: Negative for dyspnea at rest, dyspnea on exertion, cough, sputum, wheezing.  GI: See history of present illness. GU:  Negative for dysuria, hematuria, urinary incontinence, urinary frequency, nocturnal urination.  MS: Negative for joint pain, low back pain.  Derm: Negative for rash or itching.  Neuro: Negative for weakness, abnormal sensation, seizure, frequent headaches, memory loss, confusion.  Psych: Negative for anxiety, depression, suicidal ideation, hallucinations.  Endo: Negative for unusual weight change.  Heme: Negative for bruising or bleeding. Allergy: Negative for rash or hives.       Physical Examination: Vital signs in last 24 hours: Temp:  [97.5 F (36.4 C)-98.7 F (37.1 C)] 98.3 F (36.8 C) (01/05 0605) Pulse Rate:  [63-88] 65 (01/05 0605) Resp:  [15-20] 16 (01/05 0605) BP: (103-132)/(59-94) 112/64 mmHg (01/05 0605) SpO2:  [97 %-100 %] 100 % (01/05 0605) Weight:  [174 lb 8 oz (79.153 kg)-177 lb (80.287 kg)] 174 lb 8 oz (79.153 kg) (01/05 [id number])    General: elderly, thin, male in no acute distress.  Head: Normocephalic, atraumatic.   Eyes: Conjunctiva pink, no icterus. Mouth: Oropharyngeal mucosa moist and pink , no lesions erythema or exudate. Neck: Supple without thyromegaly, masses, or lymphadenopathy.  Lungs: Clear to auscultation bilaterally.  Heart: Regular rate  and rhythm, no murmurs rubs or gallops.  Abdomen: Bowel sounds are normal, nontender, nondistended, no hepatosplenomegaly or masses, no abdominal bruits or    hernia , no rebound or guarding.   Rectal: not performed Extremities: No lower extremity edema, clubbing, deformity.  Neuro: Alert and oriented x 4 , grossly normal neurologically.  Skin: Warm and dry, no rash or jaundice.   Psych: Alert and cooperative, normal mood and affect.        Intake/Output from previous day:   Intake/Output this shift:    Lab Results: CBC  Recent Labs  05/20/15 1210 05/21/15 0555  WBC 6.7 6.0  HGB 12.3* 10.9*  HCT 37.8* 33.1*  MCV 95.7 96.2  PLT 273 224   BMET  Recent Labs  05/20/15 1210 05/21/15 0555  NA 143 142  K 3.8 4.6  CL 108 111  CO2 26 25  GLUCOSE 127* 85  BUN 14 10  CREATININE 1.19 1.11  CALCIUM 9.0 8.5*   LFT  Recent Labs  05/20/15 1210  BILITOT 0.5  ALKPHOS 308*  AST 36  ALT 30  PROT 7.3  ALBUMIN 3.9    Lipase  Recent Labs  05/20/15 1210  LIPASE 26    PT/INR No results for input(s): LABPROT, INR in the last 72 hours.    Imaging Studies: Dg Chest 2 View  05/03/2015  CLINICAL DATA:  82 year old male with chest pain EXAM: CHEST  2 VIEW COMPARISON:  Radiograph dated 02/28/2015 and 07/22/2012 FINDINGS: Two views of the chest demonstrate emphysematous changes of the lungs. There is no focal consolidation, pleural effusion, or pneumothorax. Stable small right lung base nodules noted. The cardiac silhouette is within normal limits. The osseous structures appear unremarkable. IMPRESSION: No active cardiopulmonary disease. Electronically Signed   By: Anner Crete M.D.   On: 05/03/2015 19:10   Dg Abd Acute W/chest  05/20/2015  CLINICAL DATA:  Nausea, vomiting, and abdominal pain. Recent bowel obstruction. EXAM: DG ABDOMEN ACUTE W/ 1V CHEST COMPARISON:  Chest radiographs 05/03/2015. CTA chest, abdomen, and pelvis 02/27/2015. FINDINGS: The cardiomediastinal  silhouette is unchanged. The lungs remain hyperinflated with calcified right lower lung nodules and small calcified right hilar lymph nodes. Biapical scarring is again  seen. There is no evidence of acute airspace consolidation, edema, pleural effusion, or pneumothorax. Degenerative changes are noted at the left greater than right acromioclavicular joints. There is no evidence of intraperitoneal free air. Residual radiographic contrast material is noted in the rectum and descending colon. A small to moderate amount of colonic stool is present. There is a paucity of small bowel gas without dilated loops seen to clearly indicate obstruction. Small calcifications in the pelvis likely represent phleboliths. Right upper quadrant surgical clips are noted. No acute osseous abnormality is identified. IMPRESSION: 1. No evidence of acute cardiopulmonary process. 2. No evidence of bowel obstruction. Electronically Signed   By: Logan Bores M.D.   On: 05/20/2015 13:29   Dg Duanne Limerick W/water Sol Cm  05/14/2015  CLINICAL DATA:  Duodenal stricture post dilatation EXAM: WATER SOLUBLE UPPER GI SERIES TECHNIQUE: Single-column upper GI series was performed using water soluble contrast. CONTRAST:  150 mL Omnipaque-300 orally COMPARISON:  None FLUOROSCOPY TIME:  Radiation Exposure Index (as provided by the fluoroscopic device): Not provided If the device does not provide the exposure index: Fluoroscopy Time (in minutes and seconds):  4 minutes 12 seconds Number of Acquired Images: 2 plus multiple screen captures during fluoroscopy FINDINGS: Normal bowel gas pattern on scout image. Surgical clips RIGHT upper quadrant. Emphysematous changes at lung bases. Diffuse impairment of esophageal motility with incomplete clearance of barium by primary peristaltic waves. Numerous secondary and tertiary waves noted. Stricture at the distal esophagus, obstructing a 12.5 mm diameter barium tablet approximately 2 vertebral body heights above the GE  junction. No other esophageal abnormalities identified. Stomach is decompressed with normal rugal fold pattern. No obvious gastric mass or ulceration. No significant retained food debris. Normal emptying of contrast across the pylorus into the proximal duodenum. Mild bowel wall thickening at the duodenal bulb and proximal descending duodenum with dilatation of the descending duodenum. However contrast easily passes beyond the duodenum into nondilated proximal small bowel loops. No contrast extravasation identified. IMPRESSION: Distal esophageal stricture obstructing a 12.5 mm diameter barium tablet 3-5 cm estimated above the gastroesophageal junction. No evidence of gastric outlet obstruction. Mild dilatation of the descending duodenum without obstruction. Electronically Signed   By: Lavonia Dana M.D.   On: 05/14/2015 16:04  [4 week]   Impression: 82 y/o male with readmission for recurrent N/V, ongoing dysphagia in setting of esophageal stricture, duodenal bulbar ulcer with stricture s/p 3 recent EGDs and dilations as outlined above. Patient with climbing alkaline phosphatase of unclear significance at this time. It is notable that patient had distal duodenal dilation on UGIs without obstruction (distal to known stricture).   Patient likely has persistent esophageal stricture +/- duodenal stricture.   Plan: 1. To discuss with Dr. Gala Romney. ?need for further endoscopy? Cannot rule out possibility of considering CT imaging of abdomen for further evaluation of symptoms and elevated alkphos.  2. Continue IV PPI.  3. Further recommendations to follow.    We would like to thank you for the opportunity to participate in the care of Edward Trevino.  Laureen Ochs. Bernarda Caffey [facility] [phone] 1/5/20179:24 AM    LOS: 1 day

## 2015-05-22 DIAGNOSIS — K263 Acute duodenal ulcer without hemorrhage or perforation: Secondary | ICD-10-CM

## 2015-05-22 DIAGNOSIS — K311 Adult hypertrophic pyloric stenosis: Principal | ICD-10-CM

## 2015-05-22 DIAGNOSIS — F329 Major depressive disorder, single episode, unspecified: Secondary | ICD-10-CM

## 2015-05-22 DIAGNOSIS — N210 Calculus in bladder: Secondary | ICD-10-CM

## 2015-05-22 DIAGNOSIS — R112 Nausea with vomiting, unspecified: Secondary | ICD-10-CM

## 2015-05-22 HISTORY — DX: Calculus in bladder: N21.0

## 2015-05-22 LAB — URINE MICROSCOPIC-ADD ON
Bacteria, UA: NONE SEEN
SQUAMOUS EPITHELIAL / LPF: NONE SEEN
WBC UA: NONE SEEN WBC/hpf (ref 0–5)

## 2015-05-22 LAB — COMPREHENSIVE METABOLIC PANEL
ALT: 22 U/L (ref 17–63)
ANION GAP: 9 (ref 5–15)
AST: 29 U/L (ref 15–41)
Albumin: 3.4 g/dL — ABNORMAL LOW (ref 3.5–5.0)
Alkaline Phosphatase: 252 U/L — ABNORMAL HIGH (ref 38–126)
BUN: 8 mg/dL (ref 6–20)
CO2: 25 mmol/L (ref 22–32)
Calcium: 8.7 mg/dL — ABNORMAL LOW (ref 8.9–10.3)
Chloride: 107 mmol/L (ref 101–111)
Creatinine, Ser: 1.13 mg/dL (ref 0.61–1.24)
GFR calc Af Amer: >60 mL/min (ref >60)
GFR calc non Af Amer: 59 mL/min — ABNORMAL LOW (ref >60)
Glucose, Bld: 100 mg/dL — ABNORMAL HIGH (ref 65–99)
POTASSIUM: 3.7 mmol/L (ref 3.5–5.1)
SODIUM: 141 mmol/L (ref 135–145)
Total Bilirubin: 0.6 mg/dL (ref 0.3–1.2)
Total Protein: 6.5 g/dL (ref 6.5–8.1)

## 2015-05-22 LAB — URINALYSIS, ROUTINE W REFLEX MICROSCOPIC
Bilirubin Urine: NEGATIVE
Glucose, UA: NEGATIVE mg/dL
Ketones, ur: NEGATIVE mg/dL
LEUKOCYTES UA: NEGATIVE
NITRITE: NEGATIVE
PH: 6 (ref 5.0–8.0)
Protein, ur: NEGATIVE mg/dL

## 2015-05-22 LAB — CBC
HCT: 37 % — ABNORMAL LOW (ref 39.0–52.0)
Hemoglobin: 12.2 g/dL — ABNORMAL LOW (ref 13.0–17.0)
MCH: 31.3 pg (ref 26.0–34.0)
MCHC: 33 g/dL (ref 30.0–36.0)
MCV: 94.9 fL (ref 78.0–100.0)
Platelets: 243 10*3/uL (ref 150–400)
RBC: 3.9 MIL/uL — AB (ref 4.22–5.81)
RDW: 13.6 % (ref 11.5–15.5)
WBC: 5.7 10*3/uL (ref 4.0–10.5)

## 2015-05-22 MED ORDER — LORAZEPAM 0.5 MG PO TABS
0.2500 mg | ORAL_TABLET | Freq: Two times a day (BID) | ORAL | Status: DC | PRN
  Administered 2015-05-22: 0.25 mg via ORAL
  Filled 2015-05-22: qty 1

## 2015-05-22 MED ORDER — VENLAFAXINE HCL ER 75 MG PO CP24
75.0000 mg | ORAL_CAPSULE | Freq: Every day | ORAL | Status: DC
  Administered 2015-05-22 – 2015-05-24 (×3): 75 mg via ORAL
  Filled 2015-05-22 (×3): qty 1

## 2015-05-22 MED ORDER — QUETIAPINE FUMARATE 100 MG PO TABS
100.0000 mg | ORAL_TABLET | Freq: Every day | ORAL | Status: DC
  Administered 2015-05-22 – 2015-05-24 (×3): 100 mg via ORAL
  Filled 2015-05-22 (×3): qty 1

## 2015-05-22 NOTE — Telephone Encounter (Signed)
Patient aware.

## 2015-05-22 NOTE — Care Management Important Message (Signed)
Important Message  Patient Details  Name: Edward Trevino MRN: [id number] Date of Birth: June 30, 1931   Medicare Important Message Given:  Yes    Joylene Draft, RN 05/22/2015, 1:52 PM

## 2015-05-22 NOTE — Progress Notes (Signed)
Subjective: Today he's doing about the same. Continued constant low level nausea which worsens after eating. States he at a whole cup of soup today but then vomitied. I looked in the emesis bag and there was very minimal/scant, thick looking material which did not resemble broth in character or amount. Continued constant abdominal soreness. Feels frustrated with multiple recent illnesses including MI and now these GI symptoms. Denies hematochezia, melena, other abdominal pain.  Objective: Vital signs in last 24 hours: Temp:  [98 F (36.7 C)-98.1 F (36.7 C)] 98 F (36.7 C) (01/06 4591) Pulse Rate:  [60-72] 60 (01/06 0638) Resp:  [16-20] 20 (01/06 3685) BP: (109-131)/(64-68) 115/64 mmHg (01/06 0638) SpO2:  [97 %-100 %] 99 % (01/06 9923) Weight:  [175 lb 11.3 oz (79.7 kg)] 175 lb 11.3 oz (79.7 kg) (01/06 4144) Last BM Date: 05/22/15 General:   Alert and oriented, pleasant Head:  Normocephalic and atraumatic. Eyes:  No icterus, sclera clear. Conjuctiva pink.  Heart:  S1, S2 present, no murmurs noted.  Lungs: Clear to auscultation bilaterally, without wheezing, rales, or rhonchi.  Abdomen:  Bowel sounds present, soft, non-distended. No worsening abdominal TTP. No rebound or guarding. No masses appreciated  Neurologic:  Alert and  oriented x4;  grossly normal neurologically. Skin:  Warm and dry, intact without significant lesions.  Psych:  Alert and cooperative. Normal mood and affect.  Intake/Output from previous day: 01/05 0701 - 01/06 0700 In: 240 [P.O.:240] Out: -  Intake/Output this shift: Total I/O In: 240 [P.O.:240] Out: -   Lab Results:  Recent Labs  05/20/15 1210 05/21/15 0555 05/22/15 0610  WBC 6.7 6.0 5.7  HGB 12.3* 10.9* 12.2*  HCT 37.8* 33.1* 37.0*  PLT 273 224 243   BMET  Recent Labs  05/20/15 1210 05/21/15 0555 05/22/15 0610  NA 143 142 141  K 3.8 4.6 3.7  CL 108 111 107  CO2 '26 25 25  ' GLUCOSE 127* 85 100*  BUN '14 10 8  ' CREATININE 1.19 1.11  1.13  CALCIUM 9.0 8.5* 8.7*   LFT  Recent Labs  05/20/15 1210 05/22/15 0610  PROT 7.3 6.5  ALBUMIN 3.9 3.4*  AST 36 29  ALT 30 22  ALKPHOS 308* 252*  BILITOT 0.5 0.6   PT/INR No results for input(s): LABPROT, INR in the last 72 hours. Hepatitis Panel No results for input(s): HEPBSAG, HCVAB, HEPAIGM, HEPBIGM in the last 72 hours.   Studies/Results: Ct Abdomen Pelvis W Contrast  05/21/2015  CLINICAL DATA:  VOMITING X 2 MONTHS, ELEVATED ALKALINE PHOSPHATASE LEVEL, ACUTE DUODENAL ULCER WITH GASTRIC OUTLET OBSTRUCTION, HX MI, GB, APPY OMNI 300 100ML @ 3.0 60 DELAY^152m OMNIPAQUE IOHEXOL 300 MG/ML SOLN EXAM: CT ABDOMEN AND PELVIS WITH CONTRAST TECHNIQUE: Multidetector CT imaging of the abdomen and pelvis was performed using the standard protocol following bolus administration of intravenous contrast. CONTRAST:  1033mOMNIPAQUE IOHEXOL 300 MG/ML  SOLN COMPARISON:  02/27/2015 FINDINGS: Lower chest: Calcified granuloma is identified at the right lung base. There is focal scarring at both lung bases. Heart size is normal. No imaged pericardial effusion or significant coronary artery calcifications. Upper abdomen: No focal abnormality identified within the liver. There is mild intrahepatic and extrahepatic biliary duct dilatation. Previous cholecystectomy. No focal abnormality identified within the spleen, pancreas, or adrenal glands. Small right renal cyst is identified in the midpole region. A calculus is identified in the region of the right ureterovesical junction. This measures 2 mm left is associated with very little hydronephrosis or hydroureter. I  suspect that the calculus is within the bladder rather than within the distal ureter given the lack a secondary signs of obstruction. The left ureter course is normal in appearance. Gastrointestinal tract: Small hiatal hernia noted. The stomach otherwise has a normal CT appearance. Small bowel loops are normal in appearance. There are numerous colonic  diverticula. No acute diverticulitis. Status post appendectomy. Pelvis: Calculus at the right ureterovesical junction. Small layering calculus or calculi identified within the posterior aspect of the bladder. The visualized portion of the urethra is normal in appearance. The prostate gland appears enlarged. Retroperitoneum: There is atherosclerotic calcification of the abdominal aorta. No aneurysm. Abdominal wall: No retroperitoneal or mesenteric adenopathy. Osseous structures: Mild degenerative changes identified in the lower spine. 1. Calculus likely within the bladder just anterior to the ureterovesical junction. Less likely this could be within the distal most aspect of the ureterovesical junction. Other small bladder stones are also identified. No secondary signs of obstruction. 2. Small right renal cyst. 3. Status post cholecystectomy and appendectomy. 4. Small hiatal hernia. 5. Colonic diverticulosis. Electronically Signed   By: Nolon Nations M.D.   On: 05/21/2015 14:38   Dg Abd Acute W/chest  05/20/2015  CLINICAL DATA:  Nausea, vomiting, and abdominal pain. Recent bowel obstruction. EXAM: DG ABDOMEN ACUTE W/ 1V CHEST COMPARISON:  Chest radiographs 05/03/2015. CTA chest, abdomen, and pelvis 02/27/2015. FINDINGS: The cardiomediastinal silhouette is unchanged. The lungs remain hyperinflated with calcified right lower lung nodules and small calcified right hilar lymph nodes. Biapical scarring is again seen. There is no evidence of acute airspace consolidation, edema, pleural effusion, or pneumothorax. Degenerative changes are noted at the left greater than right acromioclavicular joints. There is no evidence of intraperitoneal free air. Residual radiographic contrast material is noted in the rectum and descending colon. A small to moderate amount of colonic stool is present. There is a paucity of small bowel gas without dilated loops seen to clearly indicate obstruction. Small calcifications in the pelvis  likely represent phleboliths. Right upper quadrant surgical clips are noted. No acute osseous abnormality is identified. IMPRESSION: 1. No evidence of acute cardiopulmonary process. 2. No evidence of bowel obstruction. Electronically Signed   By: Logan Bores M.D.   On: 05/20/2015 13:29    Assessment: 82 y/o male with readmission for recurrent N/V, ongoing dysphagia in setting of esophageal stricture, duodenal bulbar ulcer with stricture s/p 3 recent EGDs and dilations as outlined above. Patient with climbing alkaline phosphatase of unclear significance at this time. It is notable that patient had distal duodenal dilation on UGIs without obstruction (distal to known stricture).   Patient likely has persistent esophageal stricture +/- duodenal stricture. CT abdomen and pelvis completed which showed patent pylorus, biliary tree not dilated, small renal stone. Also with bladder stones noted. He was advanced to a clear liquid diet last night.  Today his alk phos has decreased to 252 frin 308 2 days ago. CBC stable, CMP stable. Emesis examined and not felt to be consistent with true vomiting of intake. Very minimal (consistent with a smear in amount) of thick mucous-appearing material, no emesis of full cup of soup noted. Unsure if he is truly vomiting gastric contents. Could be gagging on excess phlegm. He is on scheduled Zofran and Phenergan, not much room to add additional antiemetics.    Plan: 1. Continue clears, monitor for further emesis and document charicteristics of emesis 2. Continue to monitor, supportive measures 3. Pain and nausea management as needed 4. Continue PPI 5. Will discuss  with Dr. Gala Romney whether further intervention is warranted at this time.    Walden Field, AGNP-C Adult & Gerontological Nurse Practitioner [facility]    LOS: 2 days    05/22/2015, 1:18 PM

## 2015-05-22 NOTE — Progress Notes (Signed)
[facility] PROGRESS NOTE  Edward Trevino [id number] DOB: Sep 12, 1931 DOA: 05/20/2015 PCP: Deloria Lair, MD    Code Status: Full code. Family Communication: Discussed with son on 05/20/15 Disposition Plan: Discharge when clinically appropriate and following appropriate evaluation, possibly in the next couple days.   Consultants:  Gastroenterology  Procedures:  None  Antibiotics:  None  HPI/Subjective: Patient had one episode of nausea and vomiting overnight, but he kept his clear liquids down for breakfast. He has abdominal soreness. He had a bowel movement last night. He does not complain of pain with urination. He says that he is feeling a little depressed about was going on, but he denies suicidal ideation.  Objective: Filed Vitals:   05/21/15 2056 05/22/15 0638  BP: 109/68 115/64  Pulse: 72 60  Temp: 98 F (36.7 C) 98 F (36.7 C)  Resp: 20 20   oxygen saturation 99% on room air.  Intake/Output Summary (Last 24 hours) at 05/22/15 1402 Last data filed at 05/22/15 0851  Gross per 24 hour  Intake    480 ml  Output      0 ml  Net    480 ml   Filed Weights   05/20/15 1845 05/21/15 0605 05/22/15 [id number]  Weight: 79.379 kg (175 lb) 79.153 kg (174 lb 8 oz) 79.7 kg (175 lb 11.3 oz)    Exam:   General:  alert 82 year old man in no acute distress.  Cardiovascular: S1, S2, with a soft systolic murmur.  Respiratory: Clear to auscultation bilaterally.  Abdomen: positive bowel sounds, soft, minimal epigastric/hypogastric tenderness without rebound, guarding, or distention. No particular tenderness over the bladder.  Musculoskeletal/extremities: No pedal edema. No pretibial edema.  Neurologic/psychiatric: He has a flat affect; he appears frustrated, but he is alert and oriented 3. His speech is clear.   Data Reviewed: Basic Metabolic Panel:  Recent Labs Lab 05/20/15 1210 05/21/15 0555 05/22/15 0610  NA 143 142 141  K 3.8 4.6 3.7  CL 108 111 107  CO2  26 25 25   GLUCOSE 127* 85 100*  BUN 14 10 8   CREATININE 1.19 1.11 1.13  CALCIUM 9.0 8.5* 8.7*   Liver Function Tests:  Recent Labs Lab 05/20/15 1210 05/22/15 0610  AST 36 29  ALT 30 22  ALKPHOS 308* 252*  BILITOT 0.5 0.6  PROT 7.3 6.5  ALBUMIN 3.9 3.4*    Recent Labs Lab 05/20/15 1210  LIPASE 26   No results for input(s): AMMONIA in the last 168 hours. CBC:  Recent Labs Lab 05/20/15 1210 05/21/15 0555 05/22/15 0610  WBC 6.7 6.0 5.7  NEUTROABS 4.2  --   --   HGB 12.3* 10.9* 12.2*  HCT 37.8* 33.1* 37.0*  MCV 95.7 96.2 94.9  PLT 273 224 243   Cardiac Enzymes:  Recent Labs Lab 05/20/15 1210  TROPONINI 0.03   BNP (last 3 results)  Recent Labs  02/28/15 1430 05/20/15 1210  BNP 531.1* 232.0*    ProBNP (last 3 results) No results for input(s): PROBNP in the last 8760 hours.  CBG: No results for input(s): GLUCAP in the last 168 hours.  No results found for this or any previous visit (from the past 240 hour(s)).   Studies: Ct Abdomen Pelvis W Contrast  05/21/2015  CLINICAL DATA:  VOMITING X 2 MONTHS, ELEVATED ALKALINE PHOSPHATASE LEVEL, ACUTE DUODENAL ULCER WITH GASTRIC OUTLET OBSTRUCTION, HX MI, GB, APPY OMNI 300 100ML @ 3.0 60 DELAY^177mL OMNIPAQUE IOHEXOL 300 MG/ML SOLN EXAM: CT ABDOMEN AND PELVIS WITH CONTRAST TECHNIQUE:  Multidetector CT imaging of the abdomen and pelvis was performed using the standard protocol following bolus administration of intravenous contrast. CONTRAST:  148mL OMNIPAQUE IOHEXOL 300 MG/ML  SOLN COMPARISON:  02/27/2015 FINDINGS: Lower chest: Calcified granuloma is identified at the right lung base. There is focal scarring at both lung bases. Heart size is normal. No imaged pericardial effusion or significant coronary artery calcifications. Upper abdomen: No focal abnormality identified within the liver. There is mild intrahepatic and extrahepatic biliary duct dilatation. Previous cholecystectomy. No focal abnormality identified within  the spleen, pancreas, or adrenal glands. Small right renal cyst is identified in the midpole region. A calculus is identified in the region of the right ureterovesical junction. This measures 2 mm left is associated with very little hydronephrosis or hydroureter. I suspect that the calculus is within the bladder rather than within the distal ureter given the lack a secondary signs of obstruction. The left ureter course is normal in appearance. Gastrointestinal tract: Small hiatal hernia noted. The stomach otherwise has a normal CT appearance. Small bowel loops are normal in appearance. There are numerous colonic diverticula. No acute diverticulitis. Status post appendectomy. Pelvis: Calculus at the right ureterovesical junction. Small layering calculus or calculi identified within the posterior aspect of the bladder. The visualized portion of the urethra is normal in appearance. The prostate gland appears enlarged. Retroperitoneum: There is atherosclerotic calcification of the abdominal aorta. No aneurysm. Abdominal wall: No retroperitoneal or mesenteric adenopathy. Osseous structures: Mild degenerative changes identified in the lower spine. 1. Calculus likely within the bladder just anterior to the ureterovesical junction. Less likely this could be within the distal most aspect of the ureterovesical junction. Other small bladder stones are also identified. No secondary signs of obstruction. 2. Small right renal cyst. 3. Status post cholecystectomy and appendectomy. 4. Small hiatal hernia. 5. Colonic diverticulosis. Electronically Signed   By: Nolon Nations M.D.   On: 05/21/2015 14:38    Scheduled Meds: . metoprolol  2.5 mg Intravenous Q12H  . nitroGLYCERIN  0.4 mg Transdermal Daily  . ondansetron (ZOFRAN) IV  4 mg Intravenous 4 times per day  . pantoprazole (PROTONIX) IV  40 mg Intravenous Q12H  . QUEtiapine  100 mg Oral QHS  . venlafaxine  75 mg Oral QHS   Continuous Infusions: . dextrose 5 % and  0.9 % NaCl with KCl 20 mEq/L 70 mL/hr at 05/22/15 0022   Assessment and plan:  Principal Problem:   Nausea and vomiting Active Problems:   Gastric out let obstruction   Dysphagia   Duodenal ulcer   Erosive esophagitis   Esophageal stricture   Cardiomyopathy, ischemic-EF [id number]   Chronic systolic CHF (congestive heart failure) (HCC)   Depression   Duodenal stricture   Abnormal serum level of alkaline phosphatase   Normocytic anemia   Bladder stones   1. Nausea with vomiting and probable dysphagia in a patient with a recent history of gastric outlet obstruction, esophageal stricture, erosive esophagitis, and duodenal stricture and duodenal ulcers.  The patient was started on IV Protonix every 12 hours. He was made to be virtually nothing by mouth with exception of occasional ice chips. Gentle IV fluids were started. -GI was consulted. Per review of the recent gastric biopsies on the EGD, there was gastritis, but no H. pylori. It was felt that the gastritis was aspirin induced. Dr. Buford Dresser recommended obtaining a CT of his abdomen and pelvis to see if there was anything going on outside of the lumen of the GI tract which  could be contributing to his symptoms. The CT scan was virtually unremarkable with exception of bladder stones. The patient denies outright dysuria. -Clear liquid diet started all the evening of 1/5. He had one episode of emesis, but he has tolerated clear liquids this morning. -Otherwise patient appears comfortable, but somewhat frustrated about his symptoms. -We'll continue supportive treatment and PPI. Will await GI's follow-up evaluation and recommendations for diet advancement and otherwise.  Bladder stones, seen on CT scan. Radiographically, there appears to be no evidence of hydronephrosis. -We'll order urinalysis to rule out infection.  Chronic systolic congestive heart failure/ischemic cardiomyopathy with an EF of 20%. Patient is being continued on beta blocker, but  is being given metoprolol IV. His heart failure appears to be compensated. -We'll continue gentle IV fluids, but will decrease the dose as he is taking some liquids by mouth.  Normocytic anemia. Patient's hemoglobin was 12.3 on admission. His hemoglobin had fallen to 10.9, likely from the dilutional effects of IV fluids. -Hemoglobin back up to 12.2 without intervention. -No indication for transfusion yet. We'll continue to monitor.  Depression with anxiety and insomnia. Venlafaxine and Seroquel were on hold. They have been restarted cautiously as he is tolerating clear liquids somewhat. We'll discontinue IV lorazepam-will change it to when necessary orally at bedtime.   Time spent: 30 minutes    [facility] Pager [phone]. If 7PM-7AM, please contact night-coverage at [email or web address], password [facility] 05/22/2015, 2:02 PM  LOS: 2 days

## 2015-05-23 DIAGNOSIS — K279 Peptic ulcer, site unspecified, unspecified as acute or chronic, without hemorrhage or perforation: Secondary | ICD-10-CM

## 2015-05-23 DIAGNOSIS — K210 Gastro-esophageal reflux disease with esophagitis: Secondary | ICD-10-CM

## 2015-05-23 MED ORDER — PANTOPRAZOLE SODIUM 40 MG PO TBEC
40.0000 mg | DELAYED_RELEASE_TABLET | Freq: Two times a day (BID) | ORAL | Status: DC
  Administered 2015-05-23 – 2015-05-25 (×4): 40 mg via ORAL
  Filled 2015-05-23 (×4): qty 1

## 2015-05-23 MED ORDER — CARVEDILOL 3.125 MG PO TABS
3.1250 mg | ORAL_TABLET | Freq: Two times a day (BID) | ORAL | Status: DC
  Administered 2015-05-24 – 2015-05-25 (×3): 3.125 mg via ORAL
  Filled 2015-05-23 (×3): qty 1

## 2015-05-23 NOTE — Progress Notes (Addendum)
Discussed earlier today with Dr. Caryn Section  Patient has no complaints today. He is tolerating bariatric advanced, low residue diet. No nausea or vomiting. Denies abdominal pain. One stool reported by nursing staff this morning. No diarrhea.   Vital signs in last 24 hours: Temp:  [97.5 F (36.4 C)-98.7 F (37.1 C)] 97.5 F (36.4 C) (01/07 0528) Pulse Rate:  [63-75] 75 (01/07 0528) Resp:  [20] 20 (01/06 1407) BP: (100-122)/(58-67) 107/66 mmHg (01/07 1044) SpO2:  [97 %-100 %] 99 % (01/07 0528) Weight:  [170 lb 1.6 oz (77.157 kg)] 170 lb 1.6 oz (77.157 kg) (01/07 0528) Last BM Date: 05/23/15  General:  Alert. Disheveled.  pleasant and cooperative in NAD Abdomen:  Nondistended. No succussion splash. Normal bowel sounds,   No mass or organomegaly. Extremities:  Without  edema.    Intake/Output from previous day: 01/06 0701 - 01/07 0700 In: 1605.9 [P.O.:840; I.V.:765.9] Out: 200 [Urine:200] Intake/Output this shift:    Lab Results:  Recent Labs  05/21/15 0555 05/22/15 0610  WBC 6.0 5.7  HGB 10.9* 12.2*  HCT 33.1* 37.0*  PLT 224 243   BMET  Recent Labs  05/21/15 0555 05/22/15 0610  NA 142 141  K 4.6 3.7  CL 111 107  CO2 25 25  GLUCOSE 85 100*  BUN 10 8  CREATININE 1.11 1.13  CALCIUM 8.5* 8.7*   LFT  Recent Labs  05/22/15 0610  PROT 6.5  ALBUMIN 3.4*  AST 29  ALT 22  ALKPHOS 252*  BILITOT 0.6    Impression:  Nausea and vomiting secondary to partial gastric outlet obstruction/peptic ulcer disease. Reflux esophagitis with non-critical stricture. Clinically, he has improved over the past 24 hours with judicious advancement of diet.  Recommendations:  Keep him on current diet. Continue twice a day PPI therapy. Would observe another 24 hours before considering discharge from a GI standpoint.  We'll follow up on elevated alkaline phosphatase as an outpatient.  Continue when necessary antiasthmatic therapy.

## 2015-05-23 NOTE — Progress Notes (Signed)
[facility] PROGRESS NOTE  Edward Trevino [id number] DOB: Jan 23, 1932 DOA: 05/20/2015 PCP: Deloria Lair, MD    Code Status: Full code. Family Communication: Discussed with son on 05/20/15 Disposition Plan: Discharge when clinically appropriate and following appropriate evaluation, possibly in the next couple days.   Consultants:  Gastroenterology; Dr. Gala Romney  Procedures:  None  Antibiotics:  None  HPI/Subjective: Patient says most of his abdominal pain has subsided or resolved. He had one bowel movement overnight. His diet was advanced and he appears to be tolerating it well. He denies nausea or vomiting earlier today.  Objective: Filed Vitals:   05/23/15 1044 05/23/15 1600  BP: 107/66 109/67  Pulse:  76  Temp:  98.1 F (36.7 C)  Resp:  18   oxygen saturation 99% on room air.  Intake/Output Summary (Last 24 hours) at 05/23/15 1730 Last data filed at 05/23/15 0647  Gross per 24 hour  Intake 1005.92 ml  Output    200 ml  Net 805.92 ml   Filed Weights   05/21/15 0605 05/22/15 0638 05/23/15 0528  Weight: 79.153 kg (174 lb 8 oz) 79.7 kg (175 lb 11.3 oz) 77.157 kg (170 lb 1.6 oz)    Exam:   General:  alert 82 year old man in no acute distress.  Cardiovascular: S1, S2, with a soft systolic murmur.  Respiratory: Clear to auscultation bilaterally.  Abdomen: positive bowel sounds, soft, nontender, nondistended.  Musculoskeletal/extremities: No pedal edema. No pretibial edema.  Neurologic/psychiatric: He has a flat and stoic affect, but he is pleasant and his speech is clear.   Data Reviewed: Basic Metabolic Panel:  Recent Labs Lab 05/20/15 1210 05/21/15 0555 05/22/15 0610  NA 143 142 141  K 3.8 4.6 3.7  CL 108 111 107  CO2 26 25 25   GLUCOSE 127* 85 100*  BUN 14 10 8   CREATININE 1.19 1.11 1.13  CALCIUM 9.0 8.5* 8.7*   Liver Function Tests:  Recent Labs Lab 05/20/15 1210 05/22/15 0610  AST 36 29  ALT 30 22  ALKPHOS 308* 252*  BILITOT  0.5 0.6  PROT 7.3 6.5  ALBUMIN 3.9 3.4*    Recent Labs Lab 05/20/15 1210  LIPASE 26   No results for input(s): AMMONIA in the last 168 hours. CBC:  Recent Labs Lab 05/20/15 1210 05/21/15 0555 05/22/15 0610  WBC 6.7 6.0 5.7  NEUTROABS 4.2  --   --   HGB 12.3* 10.9* 12.2*  HCT 37.8* 33.1* 37.0*  MCV 95.7 96.2 94.9  PLT 273 224 243   Cardiac Enzymes:  Recent Labs Lab 05/20/15 1210  TROPONINI 0.03   BNP (last 3 results)  Recent Labs  02/28/15 1430 05/20/15 1210  BNP 531.1* 232.0*    ProBNP (last 3 results) No results for input(s): PROBNP in the last 8760 hours.  CBG: No results for input(s): GLUCAP in the last 168 hours.  No results found for this or any previous visit (from the past 240 hour(s)).   Studies: No results found.  Scheduled Meds: . metoprolol  2.5 mg Intravenous Q12H  . nitroGLYCERIN  0.4 mg Transdermal Daily  . ondansetron (ZOFRAN) IV  4 mg Intravenous 4 times per day  . pantoprazole (PROTONIX) IV  40 mg Intravenous Q12H  . QUEtiapine  100 mg Oral QHS  . venlafaxine XR  75 mg Oral QHS   Continuous Infusions: . dextrose 5 % and 0.9 % NaCl with KCl 20 mEq/L 1,000 mL (05/23/15 0738)   Assessment and plan:  Principal Problem:  Nausea and vomiting Active Problems:   Gastric out let obstruction   Dysphagia   Duodenal ulcer   Erosive esophagitis   Esophageal stricture   Cardiomyopathy, ischemic-EF 123456   Chronic systolic CHF (congestive heart failure) (HCC)   Depression   Duodenal stricture   Abnormal serum level of alkaline phosphatase   Normocytic anemia   Bladder stones   Acute duodenal ulcer with gastric outlet obstruction   1. Nausea with vomiting and probable dysphagia in a patient with a recent history of gastric outlet obstruction, esophageal stricture, erosive esophagitis, and duodenal stricture and duodenal ulcers.  The patient was started on IV Protonix every 12 hours. He was made to be virtually nothing by mouth with  exception of occasional ice chips. Gentle IV fluids were started. -GI was consulted. Per review of the recent gastric biopsies on the EGD, there was gastritis, but no H. pylori. It was felt that the gastritis was aspirin induced. Dr. Buford Dresser recommended obtaining a CT of his abdomen and pelvis to see if there was anything going on outside of the lumen of the GI tract which could be contributing to his symptoms. The CT scan was virtually unremarkable with exception of bladder stones. The patient denied outright dysuria. His urinalysis was unremarkable and there no urine RBCs. -His diet was advanced to clear liquids and then slowly to a soft bariatric diet. He appears to be tolerating it well. -Per the recommendations by Dr. Gala Romney, will observe the patient another day. Will change his Protonix to by mouth. -Etiology likely secondary to partial gastric outlet obstruction/peptic ulcer disease.  Bladder stones, seen on CT scan. Radiographically, there appears to be no evidence of hydronephrosis. -His urinalysis was not indicative of infection and it revealed no RBCs.  Chronic systolic congestive heart failure/ischemic cardiomyopathy with an EF of 20%. Patient is being continued on beta blocker, but it was given metoprolol IV. His heart failure appears to be compensated. -He was started on gentle IV fluids. This will be discontinued as he appears to be better hydrated. -We'll discontinue IV metoprolol and restart carvedilol orally.  Normocytic anemia. Patient's hemoglobin was 12.3 on admission. His hemoglobin had fallen to 10.9, likely from the dilutional effects of IV fluids. -Hemoglobin came back up to 12.2 without intervention. -No indication for transfusion yet. We'll continue to monitor.  Depression with anxiety and insomnia. Venlafaxine and Seroquel were initially held secondary to his nothing by mouth status. IV Ativan was given at bedtime. They were restarted when he was able to tolerate oral  liquids. IV Ativan was discontinued.    Time spent: 25 minutes    [facility] Pager [phone]. If 7PM-7AM, please contact night-coverage at [email or web address], password [facility] 05/23/2015, 5:30 PM  LOS: 3 days

## 2015-05-24 NOTE — Progress Notes (Signed)
[facility] PROGRESS NOTE  Edward Trevino [id number] DOB: 09-03-1931 DOA: 05/20/2015 PCP: Deloria Lair, MD    Code Status: Full code. Family Communication: Discussed with son on 05/20/15 Disposition Plan: Discharge when clinically appropriate ; date of discharge uncertain   Consultants:  Gastroenterology; Dr. Gala Romney  Procedures:  None  Antibiotics:  None  HPI/Subjective: Patient reported eating his supper last night and breakfast this morning without difficulty. However, nursing reported that he vomited his lunch this afternoon. I went back to ask the patient what happened  He said that he ate baked chicken , but it felt like it got stuck in his stomach and then he vomited.  Objective: Filed Vitals:   05/24/15 0456 05/24/15 0909  BP: 95/54 108/66  Pulse: 101 79  Temp: 98.4 F (36.9 C)   Resp: 20    oxygen saturation 99% on room air.  Intake/Output Summary (Last 24 hours) at 05/24/15 1327 Last data filed at 05/24/15 0700  Gross per 24 hour  Intake    120 ml  Output      0 ml  Net    120 ml   Filed Weights   05/22/15 0638 05/23/15 0528 05/24/15 0456  Weight: 79.7 kg (175 lb 11.3 oz) 77.157 kg (170 lb 1.6 oz) 77.747 kg (171 lb 6.4 oz)    Exam:   General:  alert 82 year old man in no acute distress. He is sitting in the chair looking out the window when I entered the room.  Cardiovascular: S1, S2, with a soft systolic murmur.  Respiratory: Clear to auscultation bilaterally.  Abdomen: positive bowel sounds, soft, nontender, nondistended.  Musculoskeletal/extremities: No pedal edema. No pretibial edema.  Neurologic/psychiatric: He has a flat and stoic affect, but he is pleasant and his speech is clear.   Data Reviewed: Basic Metabolic Panel:  Recent Labs Lab 05/20/15 1210 05/21/15 0555 05/22/15 0610  NA 143 142 141  K 3.8 4.6 3.7  CL 108 111 107  CO2 26 25 25   GLUCOSE 127* 85 100*  BUN 14 10 8   CREATININE 1.19 1.11 1.13  CALCIUM 9.0 8.5*  8.7*   Liver Function Tests:  Recent Labs Lab 05/20/15 1210 05/22/15 0610  AST 36 29  ALT 30 22  ALKPHOS 308* 252*  BILITOT 0.5 0.6  PROT 7.3 6.5  ALBUMIN 3.9 3.4*    Recent Labs Lab 05/20/15 1210  LIPASE 26   No results for input(s): AMMONIA in the last 168 hours. CBC:  Recent Labs Lab 05/20/15 1210 05/21/15 0555 05/22/15 0610  WBC 6.7 6.0 5.7  NEUTROABS 4.2  --   --   HGB 12.3* 10.9* 12.2*  HCT 37.8* 33.1* 37.0*  MCV 95.7 96.2 94.9  PLT 273 224 243   Cardiac Enzymes:  Recent Labs Lab 05/20/15 1210  TROPONINI 0.03   BNP (last 3 results)  Recent Labs  02/28/15 1430 05/20/15 1210  BNP 531.1* 232.0*    ProBNP (last 3 results) No results for input(s): PROBNP in the last 8760 hours.  CBG: No results for input(s): GLUCAP in the last 168 hours.  No results found for this or any previous visit (from the past 240 hour(s)).   Studies: No results found.  Scheduled Meds: . carvedilol  3.125 mg Oral BID WC  . nitroGLYCERIN  0.4 mg Transdermal Daily  . ondansetron (ZOFRAN) IV  4 mg Intravenous 4 times per day  . pantoprazole  40 mg Oral BID  . QUEtiapine  100 mg Oral QHS  . venlafaxine  XR  75 mg Oral QHS   Continuous Infusions: . dextrose 5 % and 0.9 % NaCl with KCl 20 mEq/L 10 mL/hr (05/23/15 2119)   Assessment and plan:  Principal Problem:   Nausea and vomiting Active Problems:   Gastric out let obstruction   Dysphagia   Duodenal ulcer   Erosive esophagitis   Esophageal stricture   Cardiomyopathy, ischemic-EF 123456   Chronic systolic CHF (congestive heart failure) (HCC)   Depression   Duodenal stricture   Abnormal serum level of alkaline phosphatase   Normocytic anemia   Bladder stones   Acute duodenal ulcer with gastric outlet obstruction   1. Nausea with vomiting and probable dysphagia in a patient with a recent history of gastric outlet obstruction, esophageal stricture, erosive esophagitis, and duodenal stricture and duodenal  ulcers.  The patient was started on IV Protonix every 12 hours. He was initially made to be virtually nothing by mouth with exception of occasional ice chips. Gentle IV fluids were started. -GI was consulted. Per review of the recent gastric biopsies on the EGD, there was gastritis, but no H. pylori. It was felt that the gastritis was aspirin induced. Dr. Gala Romney recommended obtaining a CT of his abdomen and pelvis to see if there was anything going on outside of the lumen of the GI tract which could be contributing to his symptoms. The CT scan was virtually unremarkable with exception of bladder stones. The patient denied outright dysuria. His urinalysis was unremarkable and there were no urine RBCs. -His diet was advanced to clear liquids and then slowly to a soft bariatric diet. He  Had tolerated the advancement with 2 meals, but today vomited [employer] chicken he had eaten for lunch.  - The patient was discussed with Dr. Gala Romney. He recommended downgrading his diet again to full liquids. We'll continue his oral PPI. We'll restart gentle IV fluids. We'll order labs tomorrow morning. -Etiology likely secondary to partial gastric outlet obstruction/peptic ulcer disease.  Bladder stones, seen on CT scan. Radiographically, there appears to be no evidence of hydronephrosis. -His urinalysis was not indicative of infection and it revealed no RBCs.  Chronic systolic congestive heart failure/ischemic cardiomyopathy with an EF of 20%. Patient is being continued on beta blocker, but it was given metoprolol IV. His heart failure appears to be compensated. -He was started on gentle IV fluids. This will be discontinued as he appears to be better hydrated. - IV metoprolol was discontinued and his home dose of carvedilol was restarted.  Normocytic anemia. Patient's hemoglobin was 12.3 on admission. His hemoglobin had fallen to 10.9, likely from the dilutional effects of IV fluids. -Hemoglobin came back up to 12.2  without intervention. -No indication for transfusion yet. We'll continue to monitor.  Depression with anxiety and insomnia. Venlafaxine and Seroquel were initially held secondary to his nothing by mouth status. IV Ativan was given at bedtime. They were restarted when he was able to tolerate oral liquids. IV Ativan was discontinued.    Time spent: 25 minutes    [facility] Pager [phone]. If 7PM-7AM, please contact night-coverage at [email or web address], password [facility] 05/24/2015, 1:27 PM  LOS: 4 days

## 2015-05-24 NOTE — Progress Notes (Signed)
Patient states was doing well until he tried to eat some baked chicken. He points to his xiphoid process and said it  "stopped there" and then came up.  Discussed with Dr. Caryn Section earlier today    Vital signs in last 24 hours: Temp:  [97.8 F (36.6 C)-98.4 F (36.9 C)] 98.4 F (36.9 C) (01/08 0456) Pulse Rate:  [76-101] 79 (01/08 0909) Resp:  [18-20] 20 (01/08 0456) BP: (95-111)/(54-67) 108/66 mmHg (01/08 0909) SpO2:  [99 %-100 %] 99 % (01/08 0456) Weight:  [171 lb 6.4 oz (77.747 kg)] 171 lb 6.4 oz (77.747 kg) (01/08 0456) Last BM Date: 05/23/15 General:   Alert, pleasant and cooperative in NAD Abdomen:  Soft and nontender Extremities:  Without clubbing or edema.    Intake/Output from previous day: 01/07 0701 - 01/08 0700 In: 120 [I.V.:120] Out: -  Intake/Output this shift:    Lab Results:  Recent Labs  05/22/15 0610  WBC 5.7  HGB 12.2*  HCT 37.0*  PLT 243   BMET  Recent Labs  05/22/15 0610  NA 141  K 3.7  CL 107  CO2 25  GLUCOSE 100*  BUN 8  CREATININE 1.13  CALCIUM 8.7*   LFT  Recent Labs  05/22/15 0610  PROT 6.5  ALBUMIN 3.4*  AST 29  ALT 22  ALKPHOS 252*  BILITOT 0.6   PT/INR No results for input(s): LABPROT, INR in the last 72 hours. Hepatitis Panel No results for input(s): HEPBSAG, HCVAB, HEPAIGM, HEPBIGM in the last 72 hours. C-Diff No results for input(s): CDIFFTOX in the last 72 hours.    Impression: Elderly gentleman with peptic ulcer disease and secondary partial gastric outlet obstruction, severe GERD with with stricture status post multiple dilations recently. He was tolerating diet until he had problems with chicken today. It sounds like he may have had a transient food impaction - as quickly as the symptoms occurred/ resolved.  Recommendations:  I think we need to just keep him on a full liquid diet and if he tolerates, keep him on it;  Bring him back in 6-8 weeks and re-look into his upper GI tract and perform additional  dilations but would prefer to wait until the esophagitis and peptic ulcer disease heals. Agree with continuing PPI therapy. We'll reassess tomorrow morning.

## 2015-05-25 ENCOUNTER — Encounter (HOSPITAL_COMMUNITY): Payer: Self-pay | Admitting: Internal Medicine

## 2015-05-25 DIAGNOSIS — R748 Abnormal levels of other serum enzymes: Secondary | ICD-10-CM | POA: Insufficient documentation

## 2015-05-25 DIAGNOSIS — D649 Anemia, unspecified: Secondary | ICD-10-CM

## 2015-05-25 LAB — COMPREHENSIVE METABOLIC PANEL
ALK PHOS: 216 U/L — AB (ref 38–126)
ALT: 19 U/L (ref 17–63)
AST: 20 U/L (ref 15–41)
Albumin: 3 g/dL — ABNORMAL LOW (ref 3.5–5.0)
Anion gap: 6 (ref 5–15)
BUN: 7 mg/dL (ref 6–20)
CALCIUM: 8.3 mg/dL — AB (ref 8.9–10.3)
CO2: 26 mmol/L (ref 22–32)
CREATININE: 1.25 mg/dL — AB (ref 0.61–1.24)
Chloride: 111 mmol/L (ref 101–111)
GFR calc non Af Amer: 52 mL/min — ABNORMAL LOW (ref >60)
GLUCOSE: 112 mg/dL — AB (ref 65–99)
Potassium: 3.8 mmol/L (ref 3.5–5.1)
Sodium: 143 mmol/L (ref 135–145)
Total Bilirubin: 0.4 mg/dL (ref 0.3–1.2)
Total Protein: 5.5 g/dL — ABNORMAL LOW (ref 6.5–8.1)

## 2015-05-25 LAB — CBC
HCT: 29.8 % — ABNORMAL LOW (ref 39.0–52.0)
Hemoglobin: 10 g/dL — ABNORMAL LOW (ref 13.0–17.0)
MCH: 31.9 pg (ref 26.0–34.0)
MCHC: 33.6 g/dL (ref 30.0–36.0)
MCV: 95.2 fL (ref 78.0–100.0)
PLATELETS: 182 10*3/uL (ref 150–400)
RBC: 3.13 MIL/uL — AB (ref 4.22–5.81)
RDW: 13.8 % (ref 11.5–15.5)
WBC: 5.3 10*3/uL (ref 4.0–10.5)

## 2015-05-25 LAB — FERRITIN: FERRITIN: 77 ng/mL (ref 24–336)

## 2015-05-25 LAB — IRON AND TIBC
Iron: 55 ug/dL (ref 45–182)
Saturation Ratios: 22 % (ref 17.9–39.5)
TIBC: 253 ug/dL (ref 250–450)
UIBC: 198 ug/dL

## 2015-05-25 LAB — GAMMA GT: GGT: 266 U/L — ABNORMAL HIGH (ref 7–50)

## 2015-05-25 MED ORDER — ONDANSETRON 4 MG PO TBDP: 4.0000 mg | ORAL_TABLET | Freq: Three times a day (TID) | ORAL | Status: DC

## 2015-05-25 NOTE — Telephone Encounter (Signed)
REVIEWED-NO ADDITIONAL RECOMMENDATIONS. 

## 2015-05-25 NOTE — Progress Notes (Signed)
    Subjective: Denies abdominal pain, N/V. Tolerated full liquids for breakfast without difficulty.   Objective: Vital signs in last 24 hours: Temp:  [97.9 F (36.6 C)-98.7 F (37.1 C)] 98.7 F (37.1 C) (01/09 0448) Pulse Rate:  [64-88] 88 (01/09 0448) Resp:  [18-20] 20 (01/09 0448) BP: (100-121)/(54-72) 101/54 mmHg (01/09 0448) SpO2:  [97 %-100 %] 100 % (01/09 0448) Weight:  [173 lb 1.6 oz (78.518 kg)] 173 lb 1.6 oz (78.518 kg) (01/09 0448) Last BM Date: 05/24/15 General:   Alert and oriented, flat affect. Irritable. Requests to be left alone.  Head:  Normocephalic and atraumatic. Eyes:  No icterus, sclera clear. Conjuctiva pink.  Abdomen:  Bowel sounds present, soft, non-tender, non-distended. Sitting up in chair, limited exam.  Extremities:  Without edema. Neurologic:  Alert and  oriented x4  Intake/Output from previous day: 01/08 0701 - 01/09 0700 In: 758.3 [I.V.:758.3] Out: -  Intake/Output this shift:    Lab Results:  Recent Labs  05/25/15 0611  WBC 5.3  HGB 10.0*  HCT 29.8*  PLT 182    BMET  Recent Labs  05/25/15 0611  NA 143  K 3.8  CL 111  CO2 26  GLUCOSE 112*  BUN 7  CREATININE 1.25*  CALCIUM 8.3*   LFT  Recent Labs  05/25/15 0611  PROT 5.5*  ALBUMIN 3.0*  AST 20  ALT 19  ALKPHOS 216*  BILITOT 0.4    Assessment: 82 year old male with PUD and secondary partial gastric outlet obstruction, s/p multiple inpatient dilations, doing well on a full liquid diet. Recommend staying on full liquids and return as outpatient in 6-8 weeks for repeat EGD with dilation as necessary.   Elevated alk phos: unclear etiology. Imaging on file. Check GGT, AMA.   Anemia: without overt GI bleeding. Likely secondary to known PUD, possible combination of chronic disease component. Check iron studies for baseline as none on file.   Plan: Continue full liquids Check GGT, AMA, iron studies Recommend continuing full liquids, PPI therapy, and arrange  outpatient follow-up for EGD with dilation as clinically indicated   Orvil Feil, ANP-BC [facility]     LOS: 5 days    05/25/2015, 11:00 AM

## 2015-05-25 NOTE — Care Management Note (Signed)
Case Management Note  Patient Details  Name: Edward Trevino MRN: [id number] Date of Birth: 12-18-1931  Subjective/Objective:                    Action/Plan:   Expected Discharge Date:  05/22/15               Expected Discharge Plan:  Home/Self Care  In-House Referral:  NA  Discharge planning Services  CM Consult  Post Acute Care Choice:  NA Choice offered to:  NA  DME Arranged:    DME Agency:     HH Arranged:    [place] Agency:     Status of Service:  Completed, signed off  Medicare Important Message Given:  Yes Date Medicare IM Given:    Medicare IM give by:    Date Additional Medicare IM Given:    Additional Medicare Important Message give by:     If discussed at [place] of Stay Meetings, dates discussed:    Additional Comments: Pt discharged home today. No CM needs noted. Christinia Gully De Lamere, RN 05/25/2015, 3:28 PM

## 2015-05-25 NOTE — Care Management Important Message (Signed)
Important Message  Patient Details  Name: Edward Trevino MRN: [id number] Date of Birth: February 18, 1932   Medicare Important Message Given:  Yes    Joylene Draft, RN 05/25/2015, 3:27 PM

## 2015-05-25 NOTE — Discharge Summary (Signed)
Physician Discharge Summary  Edward Trevino [id number] DOB: 1932/01/02 DOA: 05/20/2015  PCP: Deloria Lair, MD  Admit date: 05/20/2015 Discharge date: 05/25/2015  Time spent: Greater than 30 minutes  Recommendations for Outpatient Follow-up:  1. Dr. Oneida Alar office will call the patient for a follow-up. Tentative plan is for an outpatient follow-up for EGD with dilatation if clinically indicated. 2. Patient was instructed to follow a full liquid diet at home.   Discharge Diagnoses:  Principal Problem:   Nausea and vomiting Active Problems:   Gastric out let obstruction   Dysphagia   Duodenal ulcer   Erosive esophagitis   Esophageal stricture   Cardiomyopathy, ischemic-EF [id number]   Chronic systolic CHF (congestive heart failure) (HCC)   Depression   Duodenal stricture   Abnormal serum level of alkaline phosphatase   Normocytic anemia   Bladder stones   Acute duodenal ulcer with gastric outlet obstruction   Elevated alkaline phosphatase level   Discharge Condition: Improved.  Diet recommendation: Full liquid diet.  Filed Weights   05/23/15 0528 05/24/15 0456 05/25/15 0448  Weight: 77.157 kg (170 lb 1.6 oz) 77.747 kg (171 lb 6.4 oz) 78.518 kg (173 lb 1.6 oz)    History of present illness:   Edward Trevino is a 82 y.o. male with a history of ischemic cardiomyopathy with ejection fraction of 20%, non-ST elevation myocardial infarction in October 2006, and peptic ulcer disease. He was recently hospitalized from 05/03/2015 through 05/15/2015 for upper GI bleed secondary to severe reflux esophagitis. Per EGD on 03/07/15, the patient was noted to have severe erosive esophagitis, gastric outlet obstruction, duodenitis, and 2 small duodenal ulcers. There was also a GE junction stricture which was dilated. He presented to the ED with several episodes of nausea and vomiting. In the ED, he was afebrile and hemodynamically stable. His lab data were significant for normal lipase and normal LFTs  with exception of modestly elevated alkaline phosphatase. His stool was guaiac negative. His acute abdominal series revealed no evidence of cardiopulmonary process or bowel obstruction. His troponin I was 0.03. He was admitted for further evaluation and management.   Hospital Course:  1. Nausea with vomiting and probable dysphagia in a patient with a recent history of gastric outlet obstruction, esophageal stricture, erosive esophagitis, and duodenal stricture and duodenal ulcers.  The patient was started on IV Protonix every 12 hours. He was initially made to be virtually nothing by mouth with exception of occasional ice chips. Gentle IV fluids were started. -GI was consulted. Per review of the recent gastric biopsies on the EGD, there was gastritis, but no H. pylori. It was felt that the gastritis was aspirin induced. Dr. Gala Romney recommended obtaining a CT of his abdomen and pelvis to see if there was anything going on outside of the lumen of the GI tract which could be contributing to his symptoms. The CT scan was virtually unremarkable with exception of bladder stones. The patient denied outright dysuria. His urinalysis was unremarkable and there were no urine RBCs. -His diet was advanced to clear liquids and then slowly to a soft bariatric diet. Hehad tolerated the advancement with 2 meals, but today vomited baked chicken.  - The patient was discussed with Dr. Gala Romney. He recommended downgrading his diet again to full liquids. Patient tolerated to full liquids for several meals and was therefore discharged on a full liquid diet per the recommendation by the GI team. -The tentative plan is for an outpatient EGD with dilatation if clinically indicated. -Etiology of  the patient's presentation was likely secondary to partial gastric outlet obstruction/peptic ulcer disease.  Bladder stones, seen on CT scan. Radiographically, there appeared to be no evidence of hydronephrosis. -His urinalysis was not  indicative of infection and it revealed no RBCs.  Chronic systolic congestive heart failure/ischemic cardiomyopathy with an EF of 20%. Patient is being continued on beta blocker, but it was given metoprolol IV. His heart failure appears to be compensated. -He was started on gentle IV fluids. IV metoprolol was given instead of carvedilol while he was nothing by mouth. -Carvedilol was restarted when he was able to tolerate oral medications.   Normocytic anemia. Patient's hemoglobin was 12.3 on admission. His hemoglobin had fallen to 10.9, likely from the dilutional effects of IV fluids. There was no indication for transfusion.  Depression with anxiety and insomnia. Venlafaxine and Seroquel were initially held secondary to his nothing by mouth status. IV Ativan was given at bedtime. His chronic medications were restarted when he was able to tolerate oral liquids. IV Ativan was discontinued.   Procedures:  None  Consultations:  Gastroenterology  Discharge Exam: Filed Vitals:   05/24/15 2047 05/25/15 0448  BP: 119/72 101/54  Pulse: 64 88  Temp: 97.9 F (36.6 C) 98.7 F (37.1 C)  Resp: 20 20  Oxygen saturation 100%.  General: alert 82 year old man in no acute distress.   Cardiovascular: S1, S2, with a soft systolic murmur.  Respiratory: Clear to auscultation bilaterally.  Abdomen: positive bowel sounds, soft, nontender, nondistended.  Musculoskeletal/extremities: No pedal edema. No pretibial edema.  Neurologic/psychiatric: He has a flat and stoic affect, but he is pleasant and his speech is clear.  Discharge Instructions   Discharge Instructions    Discharge instructions    Complete by:  As directed   FOLLOW A FULL LIQUID DIET UNTIL YOU SEE DR. FIELDS OR HER PA OR NP OR  PARTNER.     Increase activity slowly    Complete by:  As directed           Current Discharge Medication List    START taking these medications   Details  ondansetron (ZOFRAN ODT) 4 MG  disintegrating tablet Take 1 tablet (4 mg total) by mouth 3 (three) times daily with meals. Qty: 60 tablet, Refills: 0      CONTINUE these medications which have NOT CHANGED   Details  atorvastatin (LIPITOR) 80 MG tablet Take 1 tablet (80 mg total) by mouth daily at 6 PM. Qty: 30 tablet, Refills: 5    carvedilol (COREG) 6.25 MG tablet Take 0.5 tablets (3.125 mg total) by mouth 2 (two) times daily. Qty: 180 tablet, Refills: 3    ENSURE PLUS (ENSURE PLUS) LIQD Take 237 mLs by mouth.    mirtazapine (REMERON) 30 MG tablet Take 30 mg by mouth at bedtime.    nitroGLYCERIN (NITRODUR - DOSED IN MG/24 HR) 0.4 mg/hr patch Place 1 patch (0.4 mg total) onto the skin daily. Qty: 30 patch, Refills: 12    pantoprazole (PROTONIX) 40 MG tablet Take 1 tablet (40 mg total) by mouth 2 (two) times daily before a meal. Qty: 60 tablet, Refills: 1    QUEtiapine (SEROQUEL) 100 MG tablet Take 100 mg by mouth at bedtime.    Venlafaxine HCl 75 MG TB24 Take 75 mg by mouth at bedtime.    nitroGLYCERIN (NITROSTAT) 0.4 MG SL tablet Place 1 tablet (0.4 mg total) under the tongue every 5 (five) minutes x 3 doses as needed for chest pain. Qty: 25 tablet, Refills:  2       No Known Allergies Follow-up Information    Follow up with Barney Drain, MD.   Specialty:  Gastroenterology   Why:  HER OFFICE WILL CALL YOU FOR FOLLOW UP.   Contact information:   [place] [id number] [phone]        The results of significant diagnostics from this hospitalization (including imaging, microbiology, ancillary and laboratory) are listed below for reference.    Significant Diagnostic Studies: Dg Chest 2 View  05/03/2015  CLINICAL DATA:  82 year old male with chest pain EXAM: CHEST  2 VIEW COMPARISON:  Radiograph dated 02/28/2015 and 07/22/2012 FINDINGS: Two views of the chest demonstrate emphysematous changes of the lungs. There is no focal consolidation, pleural  effusion, or pneumothorax. Stable small right lung base nodules noted. The cardiac silhouette is within normal limits. The osseous structures appear unremarkable. IMPRESSION: No active cardiopulmonary disease. Electronically Signed   By: Anner Crete M.D.   On: 05/03/2015 19:10   Ct Abdomen Pelvis W Contrast  05/21/2015  CLINICAL DATA:  VOMITING X 2 MONTHS, ELEVATED ALKALINE PHOSPHATASE LEVEL, ACUTE DUODENAL ULCER WITH GASTRIC OUTLET OBSTRUCTION, HX MI, GB, APPY OMNI 300 100ML @ 3.0 60 DELAY^148mL OMNIPAQUE IOHEXOL 300 MG/ML SOLN EXAM: CT ABDOMEN AND PELVIS WITH CONTRAST TECHNIQUE: Multidetector CT imaging of the abdomen and pelvis was performed using the standard protocol following bolus administration of intravenous contrast. CONTRAST:  150mL OMNIPAQUE IOHEXOL 300 MG/ML  SOLN COMPARISON:  02/27/2015 FINDINGS: Lower chest: Calcified granuloma is identified at the right lung base. There is focal scarring at both lung bases. Heart size is normal. No imaged pericardial effusion or significant coronary artery calcifications. Upper abdomen: No focal abnormality identified within the liver. There is mild intrahepatic and extrahepatic biliary duct dilatation. Previous cholecystectomy. No focal abnormality identified within the spleen, pancreas, or adrenal glands. Small right renal cyst is identified in the midpole region. A calculus is identified in the region of the right ureterovesical junction. This measures 2 mm left is associated with very little hydronephrosis or hydroureter. I suspect that the calculus is within the bladder rather than within the distal ureter given the lack a secondary signs of obstruction. The left ureter course is normal in appearance. Gastrointestinal tract: Small hiatal hernia noted. The stomach otherwise has a normal CT appearance. Small bowel loops are normal in appearance. There are numerous colonic diverticula. No acute diverticulitis. Status post appendectomy. Pelvis: Calculus at  the right ureterovesical junction. Small layering calculus or calculi identified within the posterior aspect of the bladder. The visualized portion of the urethra is normal in appearance. The prostate gland appears enlarged. Retroperitoneum: There is atherosclerotic calcification of the abdominal aorta. No aneurysm. Abdominal wall: No retroperitoneal or mesenteric adenopathy. Osseous structures: Mild degenerative changes identified in the lower spine. 1. Calculus likely within the bladder just anterior to the ureterovesical junction. Less likely this could be within the distal most aspect of the ureterovesical junction. Other small bladder stones are also identified. No secondary signs of obstruction. 2. Small right renal cyst. 3. Status post cholecystectomy and appendectomy. 4. Small hiatal hernia. 5. Colonic diverticulosis. Electronically Signed   By: Nolon Nations M.D.   On: 05/21/2015 14:38   Dg Abd Acute W/chest  05/20/2015  CLINICAL DATA:  Nausea, vomiting, and abdominal pain. Recent bowel obstruction. EXAM: DG ABDOMEN ACUTE W/ 1V CHEST COMPARISON:  Chest radiographs 05/03/2015. CTA chest, abdomen, and pelvis 02/27/2015. FINDINGS: The cardiomediastinal silhouette is unchanged.  The lungs remain hyperinflated with calcified right lower lung nodules and small calcified right hilar lymph nodes. Biapical scarring is again seen. There is no evidence of acute airspace consolidation, edema, pleural effusion, or pneumothorax. Degenerative changes are noted at the left greater than right acromioclavicular joints. There is no evidence of intraperitoneal free air. Residual radiographic contrast material is noted in the rectum and descending colon. A small to moderate amount of colonic stool is present. There is a paucity of small bowel gas without dilated loops seen to clearly indicate obstruction. Small calcifications in the pelvis likely represent phleboliths. Right upper quadrant surgical clips are noted. No acute  osseous abnormality is identified. IMPRESSION: 1. No evidence of acute cardiopulmonary process. 2. No evidence of bowel obstruction. Electronically Signed   By: Logan Bores M.D.   On: 05/20/2015 13:29   Dg Duanne Limerick W/water Sol Cm  05/14/2015  CLINICAL DATA:  Duodenal stricture post dilatation EXAM: WATER SOLUBLE UPPER GI SERIES TECHNIQUE: Single-column upper GI series was performed using water soluble contrast. CONTRAST:  150 mL Omnipaque-300 orally COMPARISON:  None FLUOROSCOPY TIME:  Radiation Exposure Index (as provided by the fluoroscopic device): Not provided If the device does not provide the exposure index: Fluoroscopy Time (in minutes and seconds):  4 minutes 12 seconds Number of Acquired Images: 2 plus multiple screen captures during fluoroscopy FINDINGS: Normal bowel gas pattern on scout image. Surgical clips RIGHT upper quadrant. Emphysematous changes at lung bases. Diffuse impairment of esophageal motility with incomplete clearance of barium by primary peristaltic waves. Numerous secondary and tertiary waves noted. Stricture at the distal esophagus, obstructing a 12.5 mm diameter barium tablet approximately 2 vertebral body heights above the GE junction. No other esophageal abnormalities identified. Stomach is decompressed with normal rugal fold pattern. No obvious gastric mass or ulceration. No significant retained food debris. Normal emptying of contrast across the pylorus into the proximal duodenum. Mild bowel wall thickening at the duodenal bulb and proximal descending duodenum with dilatation of the descending duodenum. However contrast easily passes beyond the duodenum into nondilated proximal small bowel loops. No contrast extravasation identified. IMPRESSION: Distal esophageal stricture obstructing a 12.5 mm diameter barium tablet 3-5 cm estimated above the gastroesophageal junction. No evidence of gastric outlet obstruction. Mild dilatation of the descending duodenum without obstruction.  Electronically Signed   By: Lavonia Dana M.D.   On: 05/14/2015 16:04    Microbiology: No results found for this or any previous visit (from the past 240 hour(s)).   Labs: Basic Metabolic Panel:  Recent Labs Lab 05/20/15 1210 05/21/15 0555 05/22/15 0610 05/25/15 0611  NA 143 142 141 143  K 3.8 4.6 3.7 3.8  CL 108 111 107 111  CO2 26 25 25 26   GLUCOSE 127* 85 100* 112*  BUN 14 10 8 7   CREATININE 1.19 1.11 1.13 1.25*  CALCIUM 9.0 8.5* 8.7* 8.3*   Liver Function Tests:  Recent Labs Lab 05/20/15 1210 05/22/15 0610 05/25/15 0611  AST 36 29 20  ALT 30 22 19   ALKPHOS 308* 252* 216*  BILITOT 0.5 0.6 0.4  PROT 7.3 6.5 5.5*  ALBUMIN 3.9 3.4* 3.0*    Recent Labs Lab 05/20/15 1210  LIPASE 26   No results for input(s): AMMONIA in the last 168 hours. CBC:  Recent Labs Lab 05/20/15 1210 05/21/15 0555 05/22/15 0610 05/25/15 0611  WBC 6.7 6.0 5.7 5.3  NEUTROABS 4.2  --   --   --   HGB 12.3* 10.9* 12.2* 10.0*  HCT 37.8* 33.1*  37.0* 29.8*  MCV 95.7 96.2 94.9 95.2  PLT 273 224 243 182   Cardiac Enzymes:  Recent Labs Lab 05/20/15 1210  TROPONINI 0.03   BNP: BNP (last 3 results)  Recent Labs  02/28/15 1430 05/20/15 1210  BNP 531.1* 232.0*    ProBNP (last 3 results) No results for input(s): PROBNP in the last 8760 hours.  CBG: No results for input(s): GLUCAP in the last 168 hours.     Signed:  Neftaly Inzunza MD.  [facility] 05/25/2015, 2:55 PM

## 2015-05-26 LAB — MITOCHONDRIAL ANTIBODIES: Mitochondrial M2 Ab, IgG: 11.7 Units (ref 0.0–20.0)

## 2015-06-04 ENCOUNTER — Telehealth: Payer: Self-pay

## 2015-06-04 NOTE — Telephone Encounter (Signed)
I called to speak with Edward Trevino and she is aware of new OV for 1/26 at 10 with EG and to cancel the OV on 1/30

## 2015-06-04 NOTE — Telephone Encounter (Signed)
Pts caregiver- [name] called- pt has seemed to be doing ok until today- she is aware that he is to be on a full liquid diet but this morning he insisted on having scrambled eggs. He has been gagging off and on ever since. He can drink water with no difficulties. He will gag until he coughs up phlegm but nothing else. She gave him zofran this morning but it didn't help. Pt has an ov on 06/15/15 with [place]. He does not have a fever. She was wondering if she should take him to the hospital again. Her number is [phone].

## 2015-06-04 NOTE — Telephone Encounter (Signed)
Spoke with [name]- pt had told her that he has been spitting out the zofran instead of taking it. She is going to try to put it in some applesauce to help him get it down. She is not going to give him any more solid foods and if he worsens will take him to the ED.   Manuela Schwartz, please reschedule pts ov. She said they will take any time you have next week. Please call [name] with appt- [phone]

## 2015-06-04 NOTE — Telephone Encounter (Signed)
Spoke with Almyra Free. Patient well-known to me from multiple hospitalizations.   Let's try to see him sooner than he is currently scheduled.   Would recommend he wait couple of hours before trying to eat/drink anything right now to let stomach empty out. Then resume liquid diet only (any consistency of liquid). If he develops any difficulty breathing, staying hydrated he should go to ER.

## 2015-06-08 NOTE — Progress Notes (Signed)
Quick Note:  AMA normal. Ferritin and iron normal. Likely anemia of chronic disease. GGT elevated, likely Alk Phos liver origin. Serial monitoring. May need further evaluation if worsening. Upcoming appt scheduled for our office. This was resulted while inpatient.     ______

## 2015-06-11 ENCOUNTER — Ambulatory Visit (INDEPENDENT_AMBULATORY_CARE_PROVIDER_SITE_OTHER): Payer: Medicare Other | Admitting: Nurse Practitioner

## 2015-06-11 ENCOUNTER — Encounter: Payer: Self-pay | Admitting: Nurse Practitioner

## 2015-06-11 ENCOUNTER — Ambulatory Visit (INDEPENDENT_AMBULATORY_CARE_PROVIDER_SITE_OTHER): Payer: Medicare Other | Admitting: Internal Medicine

## 2015-06-11 VITALS — BP 90/74 | HR 87 | Temp 98.1°F | Ht 75.0 in | Wt 173.4 lb

## 2015-06-11 VITALS — BP 112/66 | HR 49 | Ht 75.0 in | Wt 173.0 lb

## 2015-06-11 DIAGNOSIS — K625 Hemorrhage of anus and rectum: Secondary | ICD-10-CM | POA: Insufficient documentation

## 2015-06-11 DIAGNOSIS — R112 Nausea with vomiting, unspecified: Secondary | ICD-10-CM | POA: Diagnosis not present

## 2015-06-11 DIAGNOSIS — K5900 Constipation, unspecified: Secondary | ICD-10-CM | POA: Insufficient documentation

## 2015-06-11 DIAGNOSIS — R001 Bradycardia, unspecified: Secondary | ICD-10-CM

## 2015-06-11 DIAGNOSIS — R634 Abnormal weight loss: Secondary | ICD-10-CM

## 2015-06-11 LAB — COMPREHENSIVE METABOLIC PANEL
ALT: 14 U/L (ref 9–46)
AST: 19 U/L (ref 10–35)
Albumin: 3.6 g/dL (ref 3.6–5.1)
Alkaline Phosphatase: 204 U/L — ABNORMAL HIGH (ref 40–115)
BILIRUBIN TOTAL: 0.6 mg/dL (ref 0.2–1.2)
BUN: 16 mg/dL (ref 7–25)
CO2: 21 mmol/L (ref 20–31)
Calcium: 8.3 mg/dL — ABNORMAL LOW (ref 8.6–10.3)
Chloride: 108 mmol/L (ref 98–110)
Creat: 1.3 mg/dL — ABNORMAL HIGH (ref 0.70–1.11)
Glucose, Bld: 78 mg/dL (ref 65–99)
Potassium: 4.5 mmol/L (ref 3.5–5.3)
Sodium: 142 mmol/L (ref 135–146)
Total Protein: 6.3 g/dL (ref 6.1–8.1)

## 2015-06-11 LAB — CBC WITH DIFFERENTIAL/PLATELET
BASOS PCT: 1 % (ref 0–1)
Basophils Absolute: 0.1 10*3/uL (ref 0.0–0.1)
EOS ABS: 0.1 10*3/uL (ref 0.0–0.7)
Eosinophils Relative: 2 % (ref 0–5)
HCT: 35.5 % — ABNORMAL LOW (ref 39.0–52.0)
Hemoglobin: 11.6 g/dL — ABNORMAL LOW (ref 13.0–17.0)
Lymphocytes Relative: 28 % (ref 12–46)
Lymphs Abs: 1.9 10*3/uL (ref 0.7–4.0)
MCH: 30.8 pg (ref 26.0–34.0)
MCHC: 32.7 g/dL (ref 30.0–36.0)
MCV: 94.2 fL (ref 78.0–100.0)
MPV: 9.5 fL (ref 8.6–12.4)
Monocytes Absolute: 0.7 10*3/uL (ref 0.1–1.0)
Monocytes Relative: 10 % (ref 3–12)
NEUTROS ABS: 4 10*3/uL (ref 1.7–7.7)
Neutrophils Relative %: 59 % (ref 43–77)
PLATELETS: 229 10*3/uL (ref 150–400)
RBC: 3.77 MIL/uL — AB (ref 4.22–5.81)
RDW: 14.2 % (ref 11.5–15.5)
WBC: 6.8 10*3/uL (ref 4.0–10.5)

## 2015-06-11 NOTE — Assessment & Plan Note (Signed)
Patient with weight loss, most recently objective weight loss of about 16 pounds in the 2 months since we saw him in the office. This is likely multifactorial in origin including his inability to keep down solid foods, necessitating full liquid diet, episodic diarrhea caused by taking too much milk of magnesia. Recommended he have a nutrition consult to help with probable malnutrition, which she refused. At this point we'll check a CBC, CMP. Discussed with his caregiver ideas for increasing his caloric intake and protein intake while on the liquid diet using a blender. This included a supplements and other measures. She states she will do her best to get him to eat as much she can on a liquid diet. Return for follow-up in 4 weeks.

## 2015-06-11 NOTE — Progress Notes (Signed)
Cardiology Office Note   Date:  06/11/2015   ID:  SIGFRED GUERNSEY, DOB 1931/07/04, MRN [id number]  PCP:  Deloria Lair, MD  Cardiologist:   Dorris Carnes, MD   No chief complaint on file.  F/U of CAD, CHF     History of Present Illness: Edward Trevino is a 82 y.o. male with a history of STEMI  In October    LVEF 20%  I saw him in clinic in November  I added low dose lisinoprl to regimen  Continued lipitor  HR low for b blcker     Since I saw him he has been hosp twice with N/V  No definite obstruction found.  He is on liquid diet Being evaluated by GI  Denies CP  Breathing is stable   No palpitations.      Current Outpatient Prescriptions  Medication Sig Dispense Refill  . atorvastatin (LIPITOR) 80 MG tablet Take 1 tablet (80 mg total) by mouth daily at 6 PM. 30 tablet 5  . carvedilol (COREG) 6.25 MG tablet Take 0.5 tablets (3.125 mg total) by mouth 2 (two) times daily. 180 tablet 3  . ENSURE PLUS (ENSURE PLUS) LIQD Take 237 mLs by mouth.    . mirtazapine (REMERON) 30 MG tablet Take 30 mg by mouth at bedtime.    . nitroGLYCERIN (NITRODUR - DOSED IN MG/24 HR) 0.4 mg/hr patch Place 1 patch (0.4 mg total) onto the skin daily. 30 patch 12  . nitroGLYCERIN (NITROSTAT) 0.4 MG SL tablet Place 1 tablet (0.4 mg total) under the tongue every 5 (five) minutes x 3 doses as needed for chest pain. 25 tablet 2  . ondansetron (ZOFRAN ODT) 4 MG disintegrating tablet Take 1 tablet (4 mg total) by mouth 3 (three) times daily with meals. 60 tablet 0  . pantoprazole (PROTONIX) 40 MG tablet Take 1 tablet (40 mg total) by mouth 2 (two) times daily before a meal. 60 tablet 1  . QUEtiapine (SEROQUEL) 100 MG tablet Take 100 mg by mouth at bedtime.    . Venlafaxine HCl 75 MG TB24 Take 75 mg by mouth at bedtime.     No current facility-administered medications for this visit.    Allergies:   Review of patient's allergies indicates no known allergies.   Past Medical History  Diagnosis Date  .  Depression   . Insomnia   . Anxiety   . Suicide attempt ([place])   . Erosive esophagitis   . Gastric out let obstruction   . HOH (hard of hearing)   . Ischemic cardiomyopathy     LVEF 20%  . ST elevation myocardial infarction (STEMI) of anterior wall [facility])     October 2016 - late presentation, managed conservatively  . Gastric outlet obstruction 05/07/2015  . Multiple duodenal ulcers 05/11/15    2  were present per EGD.  . Duodenitis 05/11/15  . Esophageal stricture 05/11/15    Dilated  . Choledocholithiasis with obstruction 07/25/2012  . Bladder stones 05/22/2015    Past Surgical History  Procedure Laterality Date  . Appendectomy    . Back surgery    . Ercp N/A 07/24/2012    Dr. Gala Romney. Significant abnormalities of the bowl and proximal second portion producing partial gastric outlet stricture and with secondary gastric dilation and severe erosive reflux esophagitis. Biopsy showed benign ulceration. Normal-appearing ampulla, status post biliary sphincterotomy and ampulla balloon dilation, stone extraction and stent placement.  Marland Kitchen Sphincterotomy N/A 07/24/2012    Procedure: SPHINCTEROTOMY;  Surgeon: Herbie Baltimore  Hilton Cork, MD;  Location: [facility];  Service: Endoscopy;  Laterality: N/A;  . Esophageal biopsy N/A 07/24/2012    Procedure: BIOPSY;  Surgeon: Daneil Dolin, MD;  Location: [facility];  Service: Endoscopy;  Laterality: N/A;  Duodenal and Esophageal Biopsies  . Esophagogastroduodenoscopy N/A 07/24/2012    [id number] ampulla s/p biliary sphincterotomy and ampullary  . Balloon dilation N/A 07/24/2012    Procedure: BALLOON DILATION;  Surgeon: Daneil Dolin, MD;  Location: [facility];  Service: Endoscopy;  Laterality: N/A;  Balloon Stone Extraction, Drudging  . Removal of stones N/A 07/24/2012    Procedure: REMOVAL OF STONES;  Surgeon: Daneil Dolin, MD;  Location: [facility];  Service: Endoscopy;  Laterality: N/A;  . Egd/ercp  10/18/2012    Dr. Gala Romney: ;yloric channel stenosis, s/p dilation and bx,  persisting CBD stones with extension of sphincterotomy and sphincterotomy balloon dilation and stone extraction. Removal of biliary stent  . Esophagogastroduodenoscopy (egd) with propofol N/A 10/18/2012    [id number] channel stenosis s/p dilation/Persisting common bile duct stones with extension of sphincterotomy     and sphincterotomy balloon dilation and stone extraction.  Biliary stent removal   . Ercp N/A 10/18/2012    Procedure: ENDOSCOPIC RETROGRADE CHOLANGIOPANCREATOGRAPHY (ERCP);  Surgeon: Daneil Dolin, MD;  Location: [facility];  Service: Endoscopy;  Laterality: N/A;  . Removal of stones N/A 10/18/2012    Procedure: REMOVAL OF STONES;  Surgeon: Daneil Dolin, MD;  Location: [facility];  Service: Endoscopy;  Laterality: N/A;  . Esophageal biopsy N/A 10/18/2012    Procedure: BIOPSY;  Surgeon: Daneil Dolin, MD;  [facility]: [facility];  Service: Endoscopy;  Laterality: N/A;  . Cholecystectomy N/A 10/26/2012    Procedure: LAPAROSCOPIC CHOLECYSTECTOMY;  Surgeon: Jamesetta So, MD;  Location: [facility];  Service: General;  Laterality: N/A;  . Laparoscopy N/A 10/31/2012    Procedure: LAPAROSCOPY DIAGNOSTIC;  Surgeon: Donato Heinz, MD;  Location: [facility];  Service: General;  Laterality: N/A;  . Esophagogastroduodenoscopy N/A 05/07/2015    RMR: Severe esophagitis, retained food in the stomach precluded complete exam, pyloric channel/duodenal bulb abnormal with 1 cm ulcer, friability with high-grade gastric outlet obstruction. No dilation performed on Plavix and aspirin.  . Esophagogastroduodenoscopy N/A 05/11/2015    Rehman: Distal esophageal ulcers with circumferential ulceration at distal 3-4 cm, soft stricture dilated with scope, diffuse erythema and edema in the bulbar mucosa with 2 small ulcers in the distal segment and high-grade stricture in the middle, scope could not be passed. Dilated up to 12 mm. Could not see duodenum beyond this point because the scope was retained in the large stomach.  .  Esophagogastroduodenoscopy N/A 05/15/2015    SLF: Esophagitis with stricture, dilated GE junction stricture up to 14 mm with the balloon, D1/D2 stricture with narrowing of the lumen to 10-11 mm, dilated up to 15 mm with balloon. Downstream duodenum appear normal. Small duodenal bulbar ulcer. Biopsies from the stomach benign gastritis, no H pylori.  . Esophageal dilation N/A 05/15/2015    Procedure: ESOPHAGEAL DILATION;  Surgeon: Danie Binder, MD;  [facility];  Service: Endoscopy;  Laterality: N/A;     Social History:  The patient  reports that he has been smoking Cigars.  He has never used smokeless tobacco. He reports that he does not drink alcohol or use illicit drugs.   Family History:  The patient's family history includes Depression in his sister. There is no history of Colon cancer.    ROS:  Please see the history of present illness. All other systems are reviewed and  Negative to the above problem except as noted.    PHYSICAL EXAM: VS:  BP 112/66 mmHg  Pulse 49  Ht 6\' 3"  (1.905 m)  Wt 78.472 kg (173 lb)  BMI 21.62 kg/m2  SpO2 95%  GEN: thin 82 yo in no acute distress HEENT: normal Neck: no JVD, carotid bruits, or masses Cardiac: RRR; no murmurs, rubs, or gallops,no edema  Respiratory:  clear to auscultation bilaterally, normal work of breathing GI: soft, nontender, nondistended, + BS  No hepatomegaly  MS: no deformity Moving all extremities   Skin: warm and dry, no rash Neuro:  Strength and sensation are intact Psych: euthymic mood, full affect   EKG:  EKG is ordered today.  SR 68  Pr interval 216 msec  ANterior MI     Lipid Panel    Component Value Date/Time   CHOL 106 03/03/2015 0905   TRIG 101 03/03/2015 0905   HDL 38* 03/03/2015 0905   CHOLHDL 2.8 03/03/2015 0905   VLDL 20 03/03/2015 0905   LDLCALC 48 03/03/2015 0905      Wt Readings from Last 3 Encounters:  06/11/15 78.472 kg (173 lb)  06/11/15 78.654 kg (173 lb 6.4 oz)  05/25/15 78.518  kg (173 lb 1.6 oz)      ASSESSMENT AND PLAN:  1  CAD  I am not convinced of active angina  2.  Chronic systolic CHF  Volume status is OK  BP and HR are OK  I am reluctant to add more to meds right now until GI issues have normalized.  Do not want to risk electrolyte abnormalities.  Would like to add back ACE I once improveds.  3  HL  Keep on high dose lipitor     Disposition:   FU in 3 months    Signed, Dorris Carnes, MD  06/11/2015 4:14 PM    [facility] [address], [place], [place]  [address] Phone: [phone]; Fax: [phone]

## 2015-06-11 NOTE — Assessment & Plan Note (Signed)
Vision with rectal bleeding after trying to digitally disimpact himself. Most likely benign rectal trauma. Bleeding stopped within 2 days. No bleeding since and no bleeding before that. Continue to monitor, return for follow-up in 4 weeks.

## 2015-06-11 NOTE — Assessment & Plan Note (Signed)
Continued nausea and vomiting, although this seems to be improving. He states no nausea and the past week. Last episode of gagging and potentially vomiting was when he tried to eat scrambled eggs instead of the recommended full liquid diet. His caregiver is pushing him for full liquids only, will put foods and a blender and blended up to be very soft/liquidy. Discussed that this is probably due to recurrent stricture and either his pyloric valve or lower esophagus. He states he is tired of multiple visits and procedures. At this point he is refusing endoscopic evaluation. I offered a nutrition consult to help select foods that would be compatible with his needed diet to prevent nausea and vomiting that would also help him increase his weight and energy. He is also refusing a nutrition consult. At this point we'll check labs including CBC, CMP and return for follow-up in 4 weeks.

## 2015-06-11 NOTE — Progress Notes (Signed)
cc'ed to pcp °

## 2015-06-11 NOTE — Assessment & Plan Note (Signed)
Patient had an episode of constipation last week. At this point he found milk of magnesia and took with a caregiver described as an excessive amount. He also try to digitally disimpact himself which caused rectal bleeding as noted below. After taking the large amount of milk of magnesia he had diarrhea for the next 2-3 days. His caregiver pushed lots of fluids to keep him hydrated however he noted increased fatigue with his diarrhea. No fevers, diarrhea has resolved. Recommended he allow caregiver to dose is milk of magnesia as he needs it and to avoid trying to disimpact himself. Return for follow-up in 4 weeks.

## 2015-06-11 NOTE — Patient Instructions (Signed)
1. Today with a mostly liquid diet. 2. Try to get as much nutrition and hydration and is possible. 3. Return for follow-up in 4 weeks. 4. Have your labs drawn 1 year able to. Signed consent take once a day Colace and MiraLAX 3 times a week to help prevent constipation. 5. If any changes or worsening call us.

## 2015-06-11 NOTE — Progress Notes (Signed)
Referring Provider: [name], MD Primary Care Physician:  Deloria Lair, MD Primary GI:  Dr. Gala Romney  Chief Complaint  Patient presents with  . Follow-up    HPI:   82 year old male presents for hospital follow-up. He was admitted the hospital on 05/20/2015 for nausea and vomiting. He has a history of ischemic cardiomyopathy with an EF of 20%, peptic ulcer disease, upper GI bleed, severe reflux esophagitis. EGD on 03/07/2015 found severe erosive esophagitis, gastric outlet obstruction, duodenitis, and 2 small duodenal ulcers. Also with a GE junction stricture which was dilated. At presentation to ER his stool was guaiac negative. Initially nothing by mouth, advanced to clear liquids, then full liquids. When he was advanced to regular diet he vomited baked chicken. His diet was downgraded to full liquids and he was discharged on this diet recommended nothing but liquids by mouth. He was recommended to have outpatient follow-up for possible EGD if indicated. Phone note from caregiver dated 06/04/2015 noted patient was doing well on full liquid diet but insist on scrambled eggs and began gagging on and off ever since. Able to drink without difficulties. He was spitting out her Zofran. He was advised to bump up his appointment, weight couple hours before trying to eat or drink anything, resume liquid diet only, and ER precautions were given for any difficulty breathing or issues staying hydrated.  Today he and his caregiver state he's had several episodes of gagging since discharge. He also had the one really bad day after eating eggs. Since that day and going back to just liquids, his eating has done better. No more nausea. About 1 week ago had diarrhea really bad which lasted about 1.5 days. Yesterday began having constipation, patient attempted to self-disimpact and take a large amount of milk of magnesia. This caused some rectal bleeding and 2 days of bad diarrhea after the milk of magnesia.  Since the profuse diarrhea started is having more weakness. Caregiver is giving him a lot of gatorade to help with hydration. Bleeding after self-disimpaction lasted a day and a half, no further bleeding. Does not take colace or Miralax. Denies abdominal pain. Denies fever, chills. Has some dizziness when standing also with some shortness of breath. Does smoke cigars.Still feels weak. Denies chest pain, syncope, near syncope. Denies any other upper or lower GI symptoms.   Objective weight loss of 16 lb in 2 months. His BP is a bit low today with recheck at 90/74. When reviewing recent BPs in hospital this is within his recent baseline.  Past Medical History  Diagnosis Date  . Depression   . Insomnia   . Anxiety   . Suicide attempt ([place])   . Erosive esophagitis   . Gastric out let obstruction   . HOH (hard of hearing)   . Ischemic cardiomyopathy     LVEF 20%  . ST elevation myocardial infarction (STEMI) of anterior wall [facility])     October 2016 - late presentation, managed conservatively  . Gastric outlet obstruction 05/07/2015  . Multiple duodenal ulcers 05/11/15    2  were present per EGD.  . Duodenitis 05/11/15  . Esophageal stricture 05/11/15    Dilated  . Choledocholithiasis with obstruction 07/25/2012  . Bladder stones 05/22/2015    Past Surgical History  Procedure Laterality Date  . Appendectomy    . Back surgery    . Ercp N/A 07/24/2012    Dr. Gala Romney. Significant abnormalities of the bowl and proximal second portion producing partial gastric outlet  stricture and with secondary gastric dilation and severe erosive reflux esophagitis. Biopsy showed benign ulceration. Normal-appearing ampulla, status post biliary sphincterotomy and ampulla balloon dilation, stone extraction and stent placement.  Marland Kitchen Sphincterotomy N/A 07/24/2012    Procedure: SPHINCTEROTOMY;  Surgeon: Daneil Dolin, MD;  Location: [facility];  Service: Endoscopy;  Laterality: N/A;  . Esophageal biopsy N/A 07/24/2012     Procedure: BIOPSY;  Surgeon: Daneil Dolin, MD;  Location: [facility];  Service: Endoscopy;  Laterality: N/A;  Duodenal and Esophageal Biopsies  . Esophagogastroduodenoscopy N/A 07/24/2012    [id number] ampulla s/p biliary sphincterotomy and ampullary  . Balloon dilation N/A 07/24/2012    Procedure: BALLOON DILATION;  Surgeon: Daneil Dolin, MD;  Location: [facility];  Service: Endoscopy;  Laterality: N/A;  Balloon Stone Extraction, Drudging  . Removal of stones N/A 07/24/2012    Procedure: REMOVAL OF STONES;  Surgeon: Daneil Dolin, MD;  Location: [facility];  Service: Endoscopy;  Laterality: N/A;  . Egd/ercp  10/18/2012    Dr. Gala Romney: ;yloric channel stenosis, s/p dilation and bx, persisting CBD stones with extension of sphincterotomy and sphincterotomy balloon dilation and stone extraction. Removal of biliary stent  . Esophagogastroduodenoscopy (egd) with propofol N/A 10/18/2012    [id number] channel stenosis s/p dilation/Persisting common bile duct stones with extension of sphincterotomy     and sphincterotomy balloon dilation and stone extraction.  Biliary stent removal   . Ercp N/A 10/18/2012    Procedure: ENDOSCOPIC RETROGRADE CHOLANGIOPANCREATOGRAPHY (ERCP);  Surgeon: Daneil Dolin, MD;  Location: [facility];  Service: Endoscopy;  Laterality: N/A;  . Removal of stones N/A 10/18/2012    Procedure: REMOVAL OF STONES;  Surgeon: Daneil Dolin, MD;  Location: [facility];  Service: Endoscopy;  Laterality: N/A;  . Esophageal biopsy N/A 10/18/2012    Procedure: BIOPSY;  Surgeon: Daneil Dolin, MD;  Location: [facility];  Service: Endoscopy;  Laterality: N/A;  . Cholecystectomy N/A 10/26/2012    Procedure: LAPAROSCOPIC CHOLECYSTECTOMY;  Surgeon: Jamesetta So, MD;  Location: [facility];  Service: General;  Laterality: N/A;  . Laparoscopy N/A 10/31/2012    Procedure: LAPAROSCOPY DIAGNOSTIC;  Surgeon: Donato Heinz, MD;  Location: [facility];  Service: General;  Laterality: N/A;  . Esophagogastroduodenoscopy N/A 05/07/2015     RMR: Severe esophagitis, retained food in the stomach precluded complete exam, pyloric channel/duodenal bulb abnormal with 1 cm ulcer, friability with high-grade gastric outlet obstruction. No dilation performed on Plavix and aspirin.  . Esophagogastroduodenoscopy N/A 05/11/2015    Rehman: Distal esophageal ulcers with circumferential ulceration at distal 3-4 cm, soft stricture dilated with scope, diffuse erythema and edema in the bulbar mucosa with 2 small ulcers in the distal segment and high-grade stricture in the middle, scope could not be passed. Dilated up to 12 mm. Could not see duodenum beyond this point because the scope was retained in the large stomach.  . Esophagogastroduodenoscopy N/A 05/15/2015    SLF: Esophagitis with stricture, dilated GE junction stricture up to 14 mm with the balloon, D1/D2 stricture with narrowing of the lumen to 10-11 mm, dilated up to 15 mm with balloon. Downstream duodenum appear normal. Small duodenal bulbar ulcer. Biopsies from the stomach benign gastritis, no H pylori.  . Esophageal dilation N/A 05/15/2015    Procedure: ESOPHAGEAL DILATION;  Surgeon: Danie Binder, MD;  [facility];  Service: Endoscopy;  Laterality: N/A;    Current Outpatient Prescriptions  Medication Sig Dispense Refill  . atorvastatin (LIPITOR) 80 MG tablet Take  1 tablet (80 mg total) by mouth daily at 6 PM. 30 tablet 5  . carvedilol (COREG) 6.25 MG tablet Take 0.5 tablets (3.125 mg total) by mouth 2 (two) times daily. 180 tablet 3  . ENSURE PLUS (ENSURE PLUS) LIQD Take 237 mLs by mouth.    . mirtazapine (REMERON) 30 MG tablet Take 30 mg by mouth at bedtime.    . nitroGLYCERIN (NITRODUR - DOSED IN MG/24 HR) 0.4 mg/hr patch Place 1 patch (0.4 mg total) onto the skin daily. 30 patch 12  . nitroGLYCERIN (NITROSTAT) 0.4 MG SL tablet Place 1 tablet (0.4 mg total) under the tongue every 5 (five) minutes x 3 doses as needed for chest pain. 25 tablet 2  . ondansetron (ZOFRAN ODT) 4  MG disintegrating tablet Take 1 tablet (4 mg total) by mouth 3 (three) times daily with meals. 60 tablet 0  . pantoprazole (PROTONIX) 40 MG tablet Take 1 tablet (40 mg total) by mouth 2 (two) times daily before a meal. 60 tablet 1  . QUEtiapine (SEROQUEL) 100 MG tablet Take 100 mg by mouth at bedtime.    . Venlafaxine HCl 75 MG TB24 Take 75 mg by mouth at bedtime.     No current facility-administered medications for this visit.    Allergies as of 06/11/2015  . (No Known Allergies)    Family History  Problem Relation Age of Onset  . Colon cancer Neg Hx   . Depression Sister     Social History   Social History  . Marital Status: Single    Spouse Name: N/A  . Number of Children: N/A  . Years of Education: N/A   Occupational History  . Retired    Social History Main Topics  . Smoking status: Current Every Day Smoker    Types: Cigars  . Smokeless tobacco: Never Used  . Alcohol Use: No  . Drug Use: No  . Sexual Activity: No   Other Topics Concern  . None   Social History Narrative    Review of Systems: General: Negative for anorexia, weight loss, fever, chills. Continued fatigue as noted in HPI  ENT: Negative for hoarseness, difficulty swallowing. CV: Negative for chest pain, angina, palpitations, peripheral edema.  Respiratory: Negative for dyspnea at rest, cough, sputum, wheezing. Dyspnea when exerting himself. GI: See history of present illness.  Derm: Negative for rash or itching.  Heme: Negative for bruising or bleeding.   Physical Exam: BP 88/54 mmHg  Pulse 87  Temp(Src) 98.1 F (36.7 C) (Oral)  Ht 6\' 3"  (1.905 m)  Wt 173 lb 6.4 oz (78.654 kg)  BMI 21.67 kg/m2 General:   Alert and oriented. Pleasant and cooperative. Well-nourished and well-developed. Seems tired, but is pleasantly conversational. Head:  Normocephalic and atraumatic. Eyes:  Without icterus, sclera clear and conjunctiva pink.  Cardiovascular:  S1, S2 present without murmurs appreciated.  Extremities without clubbing or edema. Respiratory:  Clear to auscultation bilaterally. No wheezes, rales, or rhonchi. No distress.  Gastrointestinal:  +BS, soft, non-tender and non-distended. No guarding or rebound. Rectal:  Deferred  Musculoskalatal:  Symmetrical without gross deformities. Skin:  Intact without significant lesions or rashes. Neurologic:  Alert and oriented x4; grossly normal neurologically. Psych:  Alert and cooperative. Normal mood and affect. Heme/Lymph/Immune: No excessive bruising noted.    06/11/2015 11:00 AM

## 2015-06-11 NOTE — Patient Instructions (Signed)
Your physician recommends that you schedule a follow-up appointment in:  To Be Determined  Your physician recommends that you continue on your current medications as directed. Please refer to the Current Medication list given to you today.  If you need a refill on your cardiac medications before your next appointment, please call your pharmacy.  Thank you for choosing [facility]!

## 2015-06-15 ENCOUNTER — Ambulatory Visit: Payer: Self-pay | Admitting: Gastroenterology

## 2015-07-01 DIAGNOSIS — F329 Major depressive disorder, single episode, unspecified: Secondary | ICD-10-CM | POA: Diagnosis not present

## 2015-07-01 DIAGNOSIS — I2510 Atherosclerotic heart disease of native coronary artery without angina pectoris: Secondary | ICD-10-CM | POA: Diagnosis not present

## 2015-07-01 DIAGNOSIS — K311 Adult hypertrophic pyloric stenosis: Secondary | ICD-10-CM | POA: Diagnosis not present

## 2015-07-20 ENCOUNTER — Ambulatory Visit (INDEPENDENT_AMBULATORY_CARE_PROVIDER_SITE_OTHER): Payer: Medicare Other | Admitting: Nurse Practitioner

## 2015-07-20 ENCOUNTER — Encounter: Payer: Self-pay | Admitting: Internal Medicine

## 2015-07-20 ENCOUNTER — Ambulatory Visit: Payer: Self-pay | Admitting: Nurse Practitioner

## 2015-07-20 ENCOUNTER — Encounter: Payer: Self-pay | Admitting: Nurse Practitioner

## 2015-07-20 VITALS — BP 107/68 | HR 83 | Temp 98.0°F | Ht 75.0 in | Wt 175.6 lb

## 2015-07-20 DIAGNOSIS — R112 Nausea with vomiting, unspecified: Secondary | ICD-10-CM

## 2015-07-20 DIAGNOSIS — K263 Acute duodenal ulcer without hemorrhage or perforation: Secondary | ICD-10-CM | POA: Diagnosis not present

## 2015-07-20 DIAGNOSIS — K625 Hemorrhage of anus and rectum: Secondary | ICD-10-CM | POA: Diagnosis not present

## 2015-07-20 DIAGNOSIS — K311 Adult hypertrophic pyloric stenosis: Secondary | ICD-10-CM

## 2015-07-20 NOTE — Assessment & Plan Note (Signed)
Resolved, continue to monitor

## 2015-07-20 NOTE — Assessment & Plan Note (Signed)
Symptoms resolved at this time. Continue Zofran as needed. Return for follow-up in 3 months.

## 2015-07-20 NOTE — Assessment & Plan Note (Signed)
Symptoms seem much improved and/or resolved. Denies abdominal pain, no further nausea or vomiting. Diet has improved. Continues to follow a soft/blended diet with caregiver providing majority of dietary assistance. Continues to avoid NSAIDs and aspirin, still taking Protonix twice daily. Recommend he continue twice a day PPI, avoidance of NSAIDs and aspirin and diet as he has been. Return for follow-up in 3 months. Per previous endoscopy, can consider repeat EGD if he develops symptoms or worsens, although at this time he is not wanting to pursue this.

## 2015-07-20 NOTE — Patient Instructions (Signed)
1. Continue taking your acid blocker 2. Continue to avoid aspirin and NSAIDs (Advil, Motrin, ibuprofen, naproxen, naprosyn, etc) 3. Continue soft/blended diet as you have been 4. Return for follow-up in 3 months.

## 2015-07-20 NOTE — Progress Notes (Signed)
Referring Provider: [name], MD Primary Care Physician:  Deloria Lair, MD Primary GI:  Dr. Gala Romney  Chief Complaint  Patient presents with  . Constipation    HPI:   Edward Trevino is a 82 y.o. male who presents for constipation and rectal bleeding. He was last seen in our office on 06/11/2015 at which point it was noted he took an excessive amount of milk of magnesia and was occasionally attempting to disimpact himself. He had diarrhea after the excessive milk of magnesia. Recommended allowing caregiver to dose his medicine for him. His rectal bleeding was after attempted soft his impaction and likely due to rectal trauma. Recommended follow-up in 4 weeks.  He was admitted to the hospital on 05/03/2015 through 05/15/2015 for chest pain. During his hospitalization he developed hematemesis and EGD completed 05/07/2015 found severe esophagitis with major reflux component, peptic ulcer disease presents and high-grade gastric outlet obstruction, upper GI tract nonfunctional. Repeat endoscopy 05/11/2015 found extensive ulceration of distal half of esophagus and GE junction stricture which was dilated, small sliding hiatal hernia, deformity to duodenal bulb with duodenitis and 2 small ulcers and high-grade stricture which was dilated with a balloon dilator. A final endoscopy completed 05/15/2015 found mild nonerosive gastritis in the gastric antrum status post multiple biopsies, stricture at the junction of the D1/D2 which was again dilated and unable to pass adult gastroscope after dilation, small clean based ulcer in the duodenal bulb, GE junction dilation completed. Recommended dysphagia 2 diet, boost/ensure/Carnation Instant Breakfast 3 times a day between meals, hold aspirin, Protonix 30 minutes prior to meals twice a day, follow-up in one month. Biopsy showed gastritis due to aspirin use.  The patient was again admitted 05/20/2015 through 05/25/2015 for nausea and vomiting. He was started  on IV Protonix, again felt symptoms due to gastritis which was aspirin-induced, CT abdomen and pelvis was obtained which was virtually unremarkable, recommend outpatient follow-up.  Visit is assisted by caregiver. Today he states he's doing "remarkably well." No further gagging, choking, nausea, vomiting. Following a pureed diet. Has an Ensure/peanut butter/banana milkshake daily which he enjoys. Still taking Protonix twice daily. Hasn't needed Zofran recently. Is not taking NSAIDs or ASA powders. Has productive bowel movements. Denies hematochezia, melena. Has had an objective weight gain of 2-3 pounds in the past month. Denies chest pain, dyspnea, dizziness, lightheadedness, syncope, near syncope. Denies any other upper or lower GI symptoms.  Past Medical History  Diagnosis Date  . Depression   . Insomnia   . Anxiety   . Suicide attempt ([place])   . Erosive esophagitis   . Gastric out let obstruction   . HOH (hard of hearing)   . Ischemic cardiomyopathy     LVEF 20%  . ST elevation myocardial infarction (STEMI) of anterior wall [facility])     October 2016 - late presentation, managed conservatively  . Gastric outlet obstruction 05/07/2015  . Multiple duodenal ulcers 05/11/15    2  were present per EGD.  . Duodenitis 05/11/15  . Esophageal stricture 05/11/15    Dilated  . Choledocholithiasis with obstruction 07/25/2012  . Bladder stones 05/22/2015    Past Surgical History  Procedure Laterality Date  . Appendectomy    . Back surgery    . Ercp N/A 07/24/2012    Dr. Gala Romney. Significant abnormalities of the bowl and proximal second portion producing partial gastric outlet stricture and with secondary gastric dilation and severe erosive reflux esophagitis. Biopsy showed benign ulceration. Normal-appearing ampulla, status post  biliary sphincterotomy and ampulla balloon dilation, stone extraction and stent placement.  Marland Kitchen Sphincterotomy N/A 07/24/2012    Procedure: SPHINCTEROTOMY;  Surgeon: Daneil Dolin, MD;  Location: [facility];  Service: Endoscopy;  Laterality: N/A;  . Biopsy N/A 07/24/2012    Procedure: BIOPSY;  Surgeon: Daneil Dolin, MD;  Location: [facility];  Service: Endoscopy;  Laterality: N/A;  Duodenal and Esophageal Biopsies  . Esophagogastroduodenoscopy N/A 07/24/2012    [id number] ampulla s/p biliary sphincterotomy and ampullary  . Balloon dilation N/A 07/24/2012    Procedure: BALLOON DILATION;  Surgeon: Daneil Dolin, MD;  Location: [facility];  Service: Endoscopy;  Laterality: N/A;  Balloon Stone Extraction, Drudging  . Removal of stones N/A 07/24/2012    Procedure: REMOVAL OF STONES;  Surgeon: Daneil Dolin, MD;  Location: [facility];  Service: Endoscopy;  Laterality: N/A;  . Egd/ercp  10/18/2012    Dr. Gala Romney: ;yloric channel stenosis, s/p dilation and bx, persisting CBD stones with extension of sphincterotomy and sphincterotomy balloon dilation and stone extraction. Removal of biliary stent  . Esophagogastroduodenoscopy (egd) with propofol N/A 10/18/2012    [id number] channel stenosis s/p dilation/Persisting common bile duct stones with extension of sphincterotomy     and sphincterotomy balloon dilation and stone extraction.  Biliary stent removal   . Ercp N/A 10/18/2012    Procedure: ENDOSCOPIC RETROGRADE CHOLANGIOPANCREATOGRAPHY (ERCP);  Surgeon: Daneil Dolin, MD;  Location: [facility];  Service: Endoscopy;  Laterality: N/A;  . Removal of stones N/A 10/18/2012    Procedure: REMOVAL OF STONES;  Surgeon: Daneil Dolin, MD;  Location: [facility];  Service: Endoscopy;  Laterality: N/A;  . Biopsy N/A 10/18/2012    Procedure: BIOPSY;  Surgeon: Daneil Dolin, MD;  Location: [facility];  Service: Endoscopy;  Laterality: N/A;  . Cholecystectomy N/A 10/26/2012    Procedure: LAPAROSCOPIC CHOLECYSTECTOMY;  Surgeon: Jamesetta So, MD;  Location: [facility];  Service: General;  Laterality: N/A;  . Laparoscopy N/A 10/31/2012    Procedure: LAPAROSCOPY DIAGNOSTIC;  Surgeon: Donato Heinz, MD;  Location: [facility];  Service: General;  Laterality: N/A;  . Esophagogastroduodenoscopy N/A 05/07/2015    RMR: Severe esophagitis, retained food in the stomach precluded complete exam, pyloric channel/duodenal bulb abnormal with 1 cm ulcer, friability with high-grade gastric outlet obstruction. No dilation performed on Plavix and aspirin.  . Esophagogastroduodenoscopy N/A 05/11/2015    Rehman: Distal esophageal ulcers with circumferential ulceration at distal 3-4 cm, soft stricture dilated with scope, diffuse erythema and edema in the bulbar mucosa with 2 small ulcers in the distal segment and high-grade stricture in the middle, scope could not be passed. Dilated up to 12 mm. Could not see duodenum beyond this point because the scope was retained in the large stomach.  . Esophagogastroduodenoscopy N/A 05/15/2015    SLF: Esophagitis with stricture, dilated GE junction stricture up to 14 mm with the balloon, D1/D2 stricture with narrowing of the lumen to 10-11 mm, dilated up to 15 mm with balloon. Downstream duodenum appear normal. Small duodenal bulbar ulcer. Biopsies from the stomach benign gastritis, no H pylori.  . Esophageal dilation N/A 05/15/2015    Procedure: ESOPHAGEAL DILATION;  Surgeon: Danie Binder, MD;  [facility];  Service: Endoscopy;  Laterality: N/A;    Current Outpatient Prescriptions  Medication Sig Dispense Refill  . atorvastatin (LIPITOR) 80 MG tablet Take 1 tablet (80 mg total) by mouth daily at 6 PM. 30 tablet 5  . carvedilol (COREG) 6.25 MG tablet  Take 0.5 tablets (3.125 mg total) by mouth 2 (two) times daily. 180 tablet 3  . ENSURE PLUS (ENSURE PLUS) LIQD Take 237 mLs by mouth.    . mirtazapine (REMERON) 30 MG tablet Take 30 mg by mouth at bedtime.    . nitroGLYCERIN (NITRODUR - DOSED IN MG/24 HR) 0.4 mg/hr patch Place 1 patch (0.4 mg total) onto the skin daily. 30 patch 12  . nitroGLYCERIN (NITROSTAT) 0.4 MG SL tablet Place 1 tablet (0.4 mg total) under the tongue every 5  (five) minutes x 3 doses as needed for chest pain. 25 tablet 2  . ondansetron (ZOFRAN ODT) 4 MG disintegrating tablet Take 1 tablet (4 mg total) by mouth 3 (three) times daily with meals. 60 tablet 0  . pantoprazole (PROTONIX) 40 MG tablet Take 1 tablet (40 mg total) by mouth 2 (two) times daily before a meal. 60 tablet 1  . QUEtiapine (SEROQUEL) 100 MG tablet Take 100 mg by mouth at bedtime.    . Venlafaxine HCl 75 MG TB24 Take 75 mg by mouth at bedtime.     No current facility-administered medications for this visit.    Allergies as of 07/20/2015  . (No Known Allergies)    Family History  Problem Relation Age of Onset  . Colon cancer Neg Hx   . Depression Sister     Social History   Social History  . Marital Status: Single    Spouse Name: N/A  . Number of Children: N/A  . Years of Education: N/A   Occupational History  . Retired    Social History Main Topics  . Smoking status: Current Every Day Smoker    Types: Cigars  . Smokeless tobacco: Never Used  . Alcohol Use: No  . Drug Use: No  . Sexual Activity: No   Other Topics Concern  . None   Social History Narrative    Review of Systems: General: Negative for anorexia, weight loss, fever, chills, fatigue, weakness. ENT: Negative for hoarseness, difficulty swallowing , nasal congestion. CV: Negative for chest pain, angina, palpitations, peripheral edema.  Respiratory: Negative for dyspnea at rest, cough, sputum, wheezing.  GI: See history of present illness. Endo: Negative for unusual weight change.  Heme: Negative for bruising or bleeding.   Physical Exam: BP 107/68 mmHg  Pulse 83  Temp(Src) 98 F (36.7 C) (Oral)  Ht 6\' 3"  (1.905 m)  Wt 175 lb 9.6 oz (79.652 kg)  BMI 21.95 kg/m2 General:   Alert and oriented. Pleasant and cooperative. Well-nourished and well-developed. More energetic than last visit. Head:  Normocephalic and atraumatic. Eyes:  Without icterus, sclera clear and conjunctiva pink.    Cardiovascular:  S1, S2 present without murmurs appreciated. Extremities without clubbing or edema. Respiratory:  Clear to auscultation bilaterally. No wheezes, rales, or rhonchi. No distress.  Gastrointestinal:  +BS, soft, non-tender and non-distended. No HSM noted. No guarding or rebound. No masses appreciated.  Rectal:  Deferred  Neurologic:  Alert and oriented x4;  grossly normal neurologically. Psych:  Alert and cooperative. Normal mood and affect. Heme/Lymph/Immune: No excessive bruising noted.    07/20/2015 12:09 PM   Disclaimer: This note was dictated with voice recognition software. Similar sounding words can inadvertently be transcribed and may not be corrected upon review.

## 2015-07-21 NOTE — Progress Notes (Signed)
cc'ed to pcp °

## 2015-07-30 ENCOUNTER — Telehealth: Payer: Self-pay | Admitting: Internal Medicine

## 2015-07-30 ENCOUNTER — Other Ambulatory Visit: Payer: Self-pay | Admitting: Internal Medicine

## 2015-07-30 NOTE — Telephone Encounter (Signed)
Informed operator [name]) that patient was seen in [place] in January and according to the [name] note, he should be seen back in 3 months. Advised operator that Dr. Harrington Challenger is in [place] in April, so she can see him back at that time.

## 2015-07-30 NOTE — Telephone Encounter (Signed)
°*  STAT* If patient is at the pharmacy, call can be transferred to refill team.   1. Which medications need to be refilled? (please list name of each medication and dose if known) Pantoprazole-need new prescription  2. Which pharmacy/location (including street and city if local pharmacy) is medication to be sent to?[facility]-[phone]  3. Do they need a 30 day or 90 day supply? 60 and refills

## 2015-07-30 NOTE — Telephone Encounter (Signed)
New Message:   Pt wants to know when is he suppose to have his next appt?

## 2015-08-25 MED ORDER — PANTOPRAZOLE SODIUM 40 MG PO TBEC: 40.0000 mg | DELAYED_RELEASE_TABLET | Freq: Two times a day (BID) | ORAL | Status: DC

## 2015-08-25 NOTE — Telephone Encounter (Signed)
Refill complete 

## 2015-09-06 ENCOUNTER — Other Ambulatory Visit: Payer: Self-pay | Admitting: Cardiology

## 2015-09-07 ENCOUNTER — Ambulatory Visit (INDEPENDENT_AMBULATORY_CARE_PROVIDER_SITE_OTHER): Payer: Medicare Other | Admitting: Internal Medicine

## 2015-09-07 ENCOUNTER — Encounter: Payer: Self-pay | Admitting: Internal Medicine

## 2015-09-07 VITALS — BP 120/64 | HR 49 | Ht 75.0 in | Wt 185.0 lb

## 2015-09-07 DIAGNOSIS — E785 Hyperlipidemia, unspecified: Secondary | ICD-10-CM | POA: Diagnosis not present

## 2015-09-07 NOTE — Progress Notes (Signed)
Cardiology Office Note   Date:  09/07/2015   ID:  Edward Trevino, DOB 1931/07/14, MRN [id number]  PCP:  Deloria Lair, MD  Cardiologist:   Dorris Carnes, MD   F?U of CAD / CHF     History of Present Illness: Edward Trevino is a 82 y.o. male with a history of chronic systolic CHF  LVEF [id number]  STEMI in Octoberr 2016  I saw him in January   Since seen he deies dizziness  No CP  Breathing is OK       Outpatient Prescriptions Prior to Visit  Medication Sig Dispense Refill  . atorvastatin (LIPITOR) 80 MG tablet Take 1 tablet (80 mg total) by mouth daily at 6 PM. 30 tablet 5  . carvedilol (COREG) 6.25 MG tablet Take 0.5 tablets (3.125 mg total) by mouth 2 (two) times daily. 180 tablet 3  . ENSURE PLUS (ENSURE PLUS) LIQD Take 237 mLs by mouth.    . mirtazapine (REMERON) 30 MG tablet Take 30 mg by mouth at bedtime.    . nitroGLYCERIN (NITRODUR - DOSED IN MG/24 HR) 0.4 mg/hr patch Place 1 patch (0.4 mg total) onto the skin daily. 30 patch 12  . nitroGLYCERIN (NITROSTAT) 0.4 MG SL tablet Place 1 tablet (0.4 mg total) under the tongue every 5 (five) minutes x 3 doses as needed for chest pain. 25 tablet 2  . ondansetron (ZOFRAN ODT) 4 MG disintegrating tablet Take 1 tablet (4 mg total) by mouth 3 (three) times daily with meals. 60 tablet 0  . pantoprazole (PROTONIX) 40 MG tablet Take 1 tablet (40 mg total) by mouth 2 (two) times daily before a meal. 60 tablet 1  . QUEtiapine (SEROQUEL) 100 MG tablet Take 100 mg by mouth at bedtime.    . Venlafaxine HCl 75 MG TB24 Take 75 mg by mouth at bedtime.     No facility-administered medications prior to visit.     Allergies:   Review of patient's allergies indicates no known allergies.   Past Medical History  Diagnosis Date  . Depression   . Insomnia   . Anxiety   . Suicide attempt ([place])   . Erosive esophagitis   . Gastric out let obstruction   . HOH (hard of hearing)   . Ischemic cardiomyopathy     LVEF 20%  . ST elevation myocardial  infarction (STEMI) of anterior wall [facility])     October 2016 - late presentation, managed conservatively  . Gastric outlet obstruction 05/07/2015  . Multiple duodenal ulcers 05/11/15    2  were present per EGD.  . Duodenitis 05/11/15  . Esophageal stricture 05/11/15    Dilated  . Choledocholithiasis with obstruction 07/25/2012  . Bladder stones 05/22/2015    Past Surgical History  Procedure Laterality Date  . Appendectomy    . Back surgery    . Ercp N/A 07/24/2012    Dr. Gala Romney. Significant abnormalities of the bowl and proximal second portion producing partial gastric outlet stricture and with secondary gastric dilation and severe erosive reflux esophagitis. Biopsy showed benign ulceration. Normal-appearing ampulla, status post biliary sphincterotomy and ampulla balloon dilation, stone extraction and stent placement.  Marland Kitchen Sphincterotomy N/A 07/24/2012    Procedure: SPHINCTEROTOMY;  Surgeon: Daneil Dolin, MD;  Location: [facility];  Service: Endoscopy;  Laterality: N/A;  . Biopsy N/A 07/24/2012    Procedure: BIOPSY;  Surgeon: Daneil Dolin, MD;  Location: [facility];  Service: Endoscopy;  Laterality: N/A;  Duodenal and Esophageal Biopsies  .  Esophagogastroduodenoscopy N/A 07/24/2012    [id number] ampulla s/p biliary sphincterotomy and ampullary  . Balloon dilation N/A 07/24/2012    Procedure: BALLOON DILATION;  Surgeon: Daneil Dolin, MD;  Location: [facility];  Service: Endoscopy;  Laterality: N/A;  Balloon Stone Extraction, Drudging  . Removal of stones N/A 07/24/2012    Procedure: REMOVAL OF STONES;  Surgeon: Daneil Dolin, MD;  Location: [facility];  Service: Endoscopy;  Laterality: N/A;  . Egd/ercp  10/18/2012    Dr. Gala Romney: ;yloric channel stenosis, s/p dilation and bx, persisting CBD stones with extension of sphincterotomy and sphincterotomy balloon dilation and stone extraction. Removal of biliary stent  . Esophagogastroduodenoscopy (egd) with propofol N/A 10/18/2012    [id number] channel  stenosis s/p dilation/Persisting common bile duct stones with extension of sphincterotomy     and sphincterotomy balloon dilation and stone extraction.  Biliary stent removal   . Ercp N/A 10/18/2012    Procedure: ENDOSCOPIC RETROGRADE CHOLANGIOPANCREATOGRAPHY (ERCP);  Surgeon: Daneil Dolin, MD;  Location: [facility];  Service: Endoscopy;  Laterality: N/A;  . Removal of stones N/A 10/18/2012    Procedure: REMOVAL OF STONES;  Surgeon: Daneil Dolin, MD;  Location: [facility];  Service: Endoscopy;  Laterality: N/A;  . Biopsy N/A 10/18/2012    Procedure: BIOPSY;  Surgeon: Daneil Dolin, MD;  Location: [facility];  Service: Endoscopy;  Laterality: N/A;  . Cholecystectomy N/A 10/26/2012    Procedure: LAPAROSCOPIC CHOLECYSTECTOMY;  Surgeon: Jamesetta So, MD;  Location: [facility];  Service: General;  Laterality: N/A;  . Laparoscopy N/A 10/31/2012    Procedure: LAPAROSCOPY DIAGNOSTIC;  Surgeon: Donato Heinz, MD;  Location: [facility];  Service: General;  Laterality: N/A;  . Esophagogastroduodenoscopy N/A 05/07/2015    RMR: Severe esophagitis, retained food in the stomach precluded complete exam, pyloric channel/duodenal bulb abnormal with 1 cm ulcer, friability with high-grade gastric outlet obstruction. No dilation performed on Plavix and aspirin.  . Esophagogastroduodenoscopy N/A 05/11/2015    Rehman: Distal esophageal ulcers with circumferential ulceration at distal 3-4 cm, soft stricture dilated with scope, diffuse erythema and edema in the bulbar mucosa with 2 small ulcers in the distal segment and high-grade stricture in the middle, scope could not be passed. Dilated up to 12 mm. Could not see duodenum beyond this point because the scope was retained in the large stomach.  . Esophagogastroduodenoscopy N/A 05/15/2015    SLF: Esophagitis with stricture, dilated GE junction stricture up to 14 mm with the balloon, D1/D2 stricture with narrowing of the lumen to 10-11 mm, dilated up to 15 mm with balloon. Downstream duodenum  appear normal. Small duodenal bulbar ulcer. Biopsies from the stomach benign gastritis, no H pylori.  . Esophageal dilation N/A 05/15/2015    Procedure: ESOPHAGEAL DILATION;  Surgeon: Danie Binder, MD;  [facility];  Service: Endoscopy;  Laterality: N/A;     Social History:  The patient  reports that he has been smoking Cigars.  He has never used smokeless tobacco. He reports that he does not drink alcohol or use illicit drugs.   Family History:  The patient's family history includes Depression in his sister. There is no history of Colon cancer.    ROS:  Please see the history of present illness. All other systems are reviewed and  Negative to the above problem except as noted.    PHYSICAL EXAM: VS:  BP 120/64 mmHg  Pulse 49  Ht 6\' 3"  (1.905 m)  Wt 185 lb (83.915 kg)  BMI 23.12 kg/m2  SpO2 96%  GEN: Well nourished, well developed, in no acute distress HEENT: normal Neck: no JVD, carotid bruits, or masses Cardiac: RRR; no murmurs, rubs, or gallops,no edema  Respiratory:  clear to auscultation bilaterally, normal work of breathing GI: soft, nontender, nondistended, + BS  No hepatomegaly  MS: no deformity Moving all extremities   Skin: warm and dry, no rash Neuro:  Strength and sensation are intact Psych: euthymic mood, full affect   EKG:  EKG is not ordered today.   Lipid Panel    Component Value Date/Time   CHOL 106 03/03/2015 0905   TRIG 101 03/03/2015 0905   HDL 38* 03/03/2015 0905   CHOLHDL 2.8 03/03/2015 0905   VLDL 20 03/03/2015 0905   LDLCALC 48 03/03/2015 0905      Wt Readings from Last 3 Encounters:  09/07/15 185 lb (83.915 kg)  07/20/15 175 lb 9.6 oz (79.652 kg)  06/11/15 173 lb (78.472 kg)      ASSESSMENT AND PLAN:  2  Chronic systolic CHF  Volume status looks good  WHen I saw him eralier this winter he had active GI issues  I did not expand meds   BP is OK today  I would get labs  Consider additoin of Entresto to regim   I encouraged  them to get BP cuff  2 CAD  Presumed  Late presentation of MI  Conservative management  DOing OK    Check CBC, BMET, Lipids and AST today  F/U in late September   Signed, Deshun Sedivy, MD  09/07/2015 1:15 PM    [address], [place], [place]  [address] Phone: [phone]; Fax: [phone]

## 2015-09-07 NOTE — Patient Instructions (Signed)
Your physician wants you to follow-up in: late September 2017 You will receive a reminder letter in the mail two months in advance. If you don't receive a letter, please call our office to schedule the follow-up appointment.   Your physician recommends that you continue on your current medications as directed. Please refer to the Current Medication list given to you today.     Your physician recommends that you return for lab work in: cbc,bmet,lioid,ast    FASTING      Thank you for choosing [facility] !

## 2015-09-10 DIAGNOSIS — E785 Hyperlipidemia, unspecified: Secondary | ICD-10-CM | POA: Diagnosis not present

## 2015-09-10 LAB — CBC
HCT: 38 % — ABNORMAL LOW (ref 38.5–50.0)
Hemoglobin: 12.9 g/dL — ABNORMAL LOW (ref 13.2–17.1)
MCH: 32.9 pg (ref 27.0–33.0)
MCHC: 33.9 g/dL (ref 32.0–36.0)
MCV: 96.9 fL (ref 80.0–100.0)
MPV: 9.4 fL (ref 7.5–12.5)
Platelets: 212 10*3/uL (ref 140–400)
RBC: 3.92 MIL/uL — ABNORMAL LOW (ref 4.20–5.80)
RDW: 14.5 % (ref 11.0–15.0)
WBC: 8 10*3/uL (ref 3.8–10.8)

## 2015-09-11 LAB — BASIC METABOLIC PANEL
BUN: 16 mg/dL (ref 7–25)
CO2: 21 mmol/L (ref 20–31)
CREATININE: 1.28 mg/dL — AB (ref 0.70–1.11)
Calcium: 8.6 mg/dL (ref 8.6–10.3)
Chloride: 107 mmol/L (ref 98–110)
Glucose, Bld: 102 mg/dL — ABNORMAL HIGH (ref 65–99)
Potassium: 4.1 mmol/L (ref 3.5–5.3)
SODIUM: 141 mmol/L (ref 135–146)

## 2015-09-11 LAB — LIPID PANEL
Cholesterol: 90 mg/dL — ABNORMAL LOW (ref 125–200)
HDL: 45 mg/dL (ref >=40)
LDL Cholesterol: 27 mg/dL (ref <130)
Total CHOL/HDL Ratio: 2 Ratio (ref <=5.0)
Triglycerides: 90 mg/dL (ref <150)
VLDL: 18 mg/dL (ref <30)

## 2015-09-11 LAB — AST: AST: 23 U/L (ref 10–35)

## 2015-09-15 ENCOUNTER — Telehealth: Payer: Self-pay

## 2015-09-15 NOTE — Telephone Encounter (Signed)
-----   Message from [place], MD sent at 09/15/2015  2:08 PM EDT ----- Hgb is minimally decreased Kidney function is stable  Lipids are excellent I would recomm trial of 2.5 mg lisinopril   F/U BMET in 3 wks  THis should ease work load on heart

## 2015-09-15 NOTE — Telephone Encounter (Signed)
Called pt, no answer- no voicemail. Will call pt back.

## 2015-09-17 ENCOUNTER — Telehealth: Payer: Self-pay

## 2015-09-17 DIAGNOSIS — Z79899 Other long term (current) drug therapy: Secondary | ICD-10-CM

## 2015-09-17 MED ORDER — LISINOPRIL 2.5 MG PO TABS: 2.5000 mg | ORAL_TABLET | Freq: Every day | ORAL | Status: DC

## 2015-09-17 NOTE — Telephone Encounter (Signed)
Spoke with [name] caretaker [phone] wants to know if pt should continue ntg patch

## 2015-09-17 NOTE — Telephone Encounter (Signed)
-----   Message from [place], MD sent at 09/15/2015  2:08 PM EDT ----- Hgb is minimally decreased Kidney function is stable  Lipids are excellent I would recomm trial of 2.5 mg lisinopril   F/U BMET in 3 wks  THis should ease work load on heart

## 2015-09-19 NOTE — Telephone Encounter (Signed)
I would prob keep on patch  I think his BP can tolerate it

## 2015-09-22 ENCOUNTER — Telehealth: Payer: Self-pay | Admitting: *Deleted

## 2015-09-22 NOTE — Telephone Encounter (Signed)
Called and left voicemail for [name] (patient's caretaker, DPR signed in chart)

## 2015-09-22 NOTE — Telephone Encounter (Signed)
Patient's caretaker called back.  I informed that Dr. Harrington Challenger recommends continuing NTG patch. She verbalizes understanding.

## 2015-09-22 NOTE — Telephone Encounter (Signed)
Left a message for her to call back to inform that Dr. Harrington Challenger thinks patient's BP can tolerate continuing the NTG patch.

## 2015-10-20 ENCOUNTER — Encounter: Payer: Self-pay | Admitting: Nurse Practitioner

## 2015-10-20 ENCOUNTER — Ambulatory Visit (INDEPENDENT_AMBULATORY_CARE_PROVIDER_SITE_OTHER): Payer: Medicare Other | Admitting: Nurse Practitioner

## 2015-10-20 ENCOUNTER — Ambulatory Visit: Payer: Self-pay | Admitting: Nurse Practitioner

## 2015-10-20 VITALS — BP 119/83 | HR 86 | Temp 98.2°F | Ht 75.0 in | Wt 182.8 lb

## 2015-10-20 DIAGNOSIS — R1310 Dysphagia, unspecified: Secondary | ICD-10-CM | POA: Diagnosis not present

## 2015-10-20 DIAGNOSIS — K263 Acute duodenal ulcer without hemorrhage or perforation: Secondary | ICD-10-CM | POA: Diagnosis not present

## 2015-10-20 DIAGNOSIS — R634 Abnormal weight loss: Secondary | ICD-10-CM | POA: Diagnosis not present

## 2015-10-20 DIAGNOSIS — K311 Adult hypertrophic pyloric stenosis: Secondary | ICD-10-CM

## 2015-10-20 DIAGNOSIS — Z79899 Other long term (current) drug therapy: Secondary | ICD-10-CM | POA: Diagnosis not present

## 2015-10-20 NOTE — Progress Notes (Signed)
Referring Provider: [name], MD Primary Care Physician:  Deloria Lair, MD Primary GI:  Dr. Gala Romney  Chief Complaint  Patient presents with  . Follow-up    HPI:   Edward Trevino is a 82 y.o. male who presents for follow-up on nausea and vomiting. He has an extensive, history of severe ulcerative gastritis and esophagitis with gastric outlet obstruction. The gastric outlet obstruction was dilated total of 2 times with continued obstruction over the course of multiple EGDs. His gastric ulcerations slowly improved over the course of serial endoscopies for evaluation and his last EGD was completed on 05/15/2015 found mild nonerosive gastritis in the gastric antrum status post multiple biopsies, stricture at the junction of the D1/D2 which was again dilated and unable to pass adult gastroscope after dilation, small clean based ulcer in the duodenal bulb, GE junction dilation completed. At his last visit he was doing very well and without any further choking or gagging, eating soft/pured diet and had begun to enjoy this and put on some weight as well. Continued taking Protonix twice a day at that time, had not needed Zofran for nausea. Recommended he avoid NSAIDs, continue soft/pured diet, call for any worsening or severe symptoms, term for follow-up in 3 months. Noted possible to repeat endoscopy if symptoms return.  Today he states he's doing well. Still on a soft diet, no swallowing issues. Slowly adding minimal other foods like well-steamed brocoli and tolerates this well. Likes frozen fruit smoothies. No GERD symptoms. Denies abdominal pain, N/V, hematochezia, melena, unintentional weight loss, fever, chills. Has regular bowel movements which are soft and pass easily, no straining.   Past Medical History  Diagnosis Date  . Depression   . Insomnia   . Anxiety   . Suicide attempt ([place])   . Erosive esophagitis   . Gastric out let obstruction   . HOH (hard of hearing)   . Ischemic  cardiomyopathy     LVEF 20%  . ST elevation myocardial infarction (STEMI) of anterior wall [facility])     October 2016 - late presentation, managed conservatively  . Gastric outlet obstruction 05/07/2015  . Multiple duodenal ulcers 05/11/15    2  were present per EGD.  . Duodenitis 05/11/15  . Esophageal stricture 05/11/15    Dilated  . Choledocholithiasis with obstruction 07/25/2012  . Bladder stones 05/22/2015    Past Surgical History  Procedure Laterality Date  . Appendectomy    . Back surgery    . Ercp N/A 07/24/2012    Dr. Gala Romney. Significant abnormalities of the bowl and proximal second portion producing partial gastric outlet stricture and with secondary gastric dilation and severe erosive reflux esophagitis. Biopsy showed benign ulceration. Normal-appearing ampulla, status post biliary sphincterotomy and ampulla balloon dilation, stone extraction and stent placement.  Marland Kitchen Sphincterotomy N/A 07/24/2012    Procedure: SPHINCTEROTOMY;  Surgeon: Daneil Dolin, MD;  Location: [facility];  Service: Endoscopy;  Laterality: N/A;  . Biopsy N/A 07/24/2012    Procedure: BIOPSY;  Surgeon: Daneil Dolin, MD;  Location: [facility];  Service: Endoscopy;  Laterality: N/A;  Duodenal and Esophageal Biopsies  . Esophagogastroduodenoscopy N/A 07/24/2012    [id number] ampulla s/p biliary sphincterotomy and ampullary  . Balloon dilation N/A 07/24/2012    Procedure: BALLOON DILATION;  Surgeon: Daneil Dolin, MD;  Location: [facility];  Service: Endoscopy;  Laterality: N/A;  Balloon Stone Extraction, Drudging  . Removal of stones N/A 07/24/2012    Procedure: REMOVAL OF STONES;  Surgeon:  Daneil Dolin, MD;  Location: [facility];  Service: Endoscopy;  Laterality: N/A;  . Egd/ercp  10/18/2012    Dr. Gala Romney: ;yloric channel stenosis, s/p dilation and bx, persisting CBD stones with extension of sphincterotomy and sphincterotomy balloon dilation and stone extraction. Removal of biliary stent  . Esophagogastroduodenoscopy (egd)  with propofol N/A 10/18/2012    [id number] channel stenosis s/p dilation/Persisting common bile duct stones with extension of sphincterotomy     and sphincterotomy balloon dilation and stone extraction.  Biliary stent removal   . Ercp N/A 10/18/2012    Procedure: ENDOSCOPIC RETROGRADE CHOLANGIOPANCREATOGRAPHY (ERCP);  Surgeon: Daneil Dolin, MD;  Location: [facility];  Service: Endoscopy;  Laterality: N/A;  . Removal of stones N/A 10/18/2012    Procedure: REMOVAL OF STONES;  Surgeon: Daneil Dolin, MD;  Location: [facility];  Service: Endoscopy;  Laterality: N/A;  . Biopsy N/A 10/18/2012    Procedure: BIOPSY;  Surgeon: Daneil Dolin, MD;  Location: [facility];  Service: Endoscopy;  Laterality: N/A;  . Cholecystectomy N/A 10/26/2012    Procedure: LAPAROSCOPIC CHOLECYSTECTOMY;  Surgeon: Jamesetta So, MD;  Location: [facility];  Service: General;  Laterality: N/A;  . Laparoscopy N/A 10/31/2012    Procedure: LAPAROSCOPY DIAGNOSTIC;  Surgeon: Donato Heinz, MD;  Location: [facility];  Service: General;  Laterality: N/A;  . Esophagogastroduodenoscopy N/A 05/07/2015    RMR: Severe esophagitis, retained food in the stomach precluded complete exam, pyloric channel/duodenal bulb abnormal with 1 cm ulcer, friability with high-grade gastric outlet obstruction. No dilation performed on Plavix and aspirin.  . Esophagogastroduodenoscopy N/A 05/11/2015    Rehman: Distal esophageal ulcers with circumferential ulceration at distal 3-4 cm, soft stricture dilated with scope, diffuse erythema and edema in the bulbar mucosa with 2 small ulcers in the distal segment and high-grade stricture in the middle, scope could not be passed. Dilated up to 12 mm. Could not see duodenum beyond this point because the scope was retained in the large stomach.  . Esophagogastroduodenoscopy N/A 05/15/2015    SLF: Esophagitis with stricture, dilated GE junction stricture up to 14 mm with the balloon, D1/D2 stricture with narrowing of the lumen to 10-11 mm,  dilated up to 15 mm with balloon. Downstream duodenum appear normal. Small duodenal bulbar ulcer. Biopsies from the stomach benign gastritis, no H pylori.  . Esophageal dilation N/A 05/15/2015    Procedure: ESOPHAGEAL DILATION;  Surgeon: Danie Binder, MD;  [facility];  Service: Endoscopy;  Laterality: N/A;    Current Outpatient Prescriptions  Medication Sig Dispense Refill  . atorvastatin (LIPITOR) 80 MG tablet TAKE ONE TABLET BY MOUTH ONCE DAILY AT 6 PM 30 tablet 5  . carvedilol (COREG) 6.25 MG tablet Take 0.5 tablets (3.125 mg total) by mouth 2 (two) times daily. 180 tablet 3  . ENSURE PLUS (ENSURE PLUS) LIQD Take 237 mLs by mouth.    Marland Kitchen lisinopril (PRINIVIL,ZESTRIL) 2.5 MG tablet Take 1 tablet (2.5 mg total) by mouth daily. 90 tablet 3  . mirtazapine (REMERON) 30 MG tablet Take 30 mg by mouth at bedtime.    . nitroGLYCERIN (NITRODUR - DOSED IN MG/24 HR) 0.4 mg/hr patch Place 1 patch (0.4 mg total) onto the skin daily. 30 patch 12  . nitroGLYCERIN (NITROSTAT) 0.4 MG SL tablet Place 1 tablet (0.4 mg total) under the tongue every 5 (five) minutes x 3 doses as needed for chest pain. 25 tablet 2  . ondansetron (ZOFRAN ODT) 4 MG disintegrating tablet Take 1 tablet (4  mg total) by mouth 3 (three) times daily with meals. 60 tablet 0  . pantoprazole (PROTONIX) 40 MG tablet Take 1 tablet (40 mg total) by mouth 2 (two) times daily before a meal. 60 tablet 1  . QUEtiapine (SEROQUEL) 100 MG tablet Take 100 mg by mouth at bedtime.    . Venlafaxine HCl 75 MG TB24 Take 75 mg by mouth at bedtime.     No current facility-administered medications for this visit.    Allergies as of 10/20/2015  . (No Known Allergies)    Family History  Problem Relation Age of Onset  . Colon cancer Neg Hx   . Depression Sister     Social History   Social History  . Marital Status: Single    Spouse Name: N/A  . Number of Children: N/A  . Years of Education: N/A   Occupational History  . Retired     Social History Main Topics  . Smoking status: Current Every Day Smoker    Types: Cigars  . Smokeless tobacco: Never Used  . Alcohol Use: No  . Drug Use: No  . Sexual Activity: No   Other Topics Concern  . None   Social History Narrative    Review of Systems: General: Negative for anorexia, weight loss, fever, chills, fatigue, weakness. ENT: Negative for hoarseness, difficulty swallowing. CV: Negative for chest pain, angina, palpitations, peripheral edema.  Respiratory: Negative for dyspnea at rest, cough, sputum, wheezing.  GI: See history of present illness. Endo: Negative for unusual weight change.  Heme: Negative for bruising or bleeding. Allergy: Negative for rash or hives.   Physical Exam: BP 119/83 mmHg  Pulse 86  Temp(Src) 98.2 F (36.8 C)  Ht 6\' 3"  (1.905 m)  Wt 182 lb 12.8 oz (82.918 kg)  BMI 22.85 kg/m2 General:   Alert and oriented. Pleasant and cooperative. Well-nourished and well-developed.  Head:  Normocephalic and atraumatic. Ears:  Normal auditory acuity. Cardiovascular:  S1, S2 present without murmurs appreciated. Extremities without clubbing or edema. Respiratory:  Clear to auscultation bilaterally. No wheezes, rales, or rhonchi. No distress.  Gastrointestinal:  +BS, soft, non-tender and non-distended. No HSM noted. No guarding or rebound. No masses appreciated.  Rectal:  Deferred  Musculoskalatal:  Symmetrical without gross deformities. Neurologic:  Alert and oriented x4;  grossly normal neurologically. Psych:  Alert and cooperative. Normal mood and affect. Heme/Lymph/Immune: No excessive bruising noted.    10/20/2015 2:14 PM   Disclaimer: This note was dictated with voice recognition software. Similar sounding words can inadvertently be transcribed and may not be corrected upon review.

## 2015-10-20 NOTE — Progress Notes (Signed)
cc'ed to pcp °

## 2015-10-20 NOTE — Assessment & Plan Note (Signed)
The past several months he has put on weight. From his last visit 3 months ago he was put on approximately 10-12 pounds. Is doing well with his improved diet and tolerance of foods. Recommend continue diet as noted above. Return follow-up in 6 months.

## 2015-10-20 NOTE — Patient Instructions (Signed)
1. Continue taking Protonix twice a day. 2. Continue with a soft diet as you have been. 3. Return for follow-up in 6 months. 4. Let us know if you have any returning symptoms or worsening symptoms and we can see you sooner.

## 2015-10-20 NOTE — Assessment & Plan Note (Signed)
History of extensive ulcerations of the esophagus, stomach, duodenum along with ulcer related pyloric channel constriction and subsequent nausea and vomiting. Currently doing very well, no heartburn or GERD symptoms. Continues on Protonix twice a day. Return for follow-up in 6 months.

## 2015-10-20 NOTE — Assessment & Plan Note (Signed)
Patient with dysphagia previously. Is currently doing very well and a soft diet, his caretaker starting to add in soft/solid foods like very well steamed broccoli. Is keeping all his foods down, has put on weight. Is now taking ensure only once a day otherwise supplementing with fresh fruit smoothies. Is very happy with his progress. Recommend continue current diet, return for follow-up in 6 months.

## 2015-10-21 LAB — BASIC METABOLIC PANEL
BUN / CREAT RATIO: 11 (ref 10–24)
BUN: 12 mg/dL (ref 8–27)
CHLORIDE: 104 mmol/L (ref 96–106)
CO2: 26 mmol/L (ref 18–29)
Calcium: 8.9 mg/dL (ref 8.6–10.2)
Creatinine, Ser: 1.06 mg/dL (ref 0.76–1.27)
GFR calc non Af Amer: 65 mL/min/{1.73_m2} (ref >59)
GFR, EST AFRICAN AMERICAN: 76 mL/min/{1.73_m2} (ref >59)
GLUCOSE: 84 mg/dL (ref 65–99)
Potassium: 4.5 mmol/L (ref 3.5–5.2)
SODIUM: 143 mmol/L (ref 134–144)

## 2015-11-24 ENCOUNTER — Other Ambulatory Visit: Payer: Self-pay | Admitting: Cardiology

## 2016-01-25 ENCOUNTER — Other Ambulatory Visit: Payer: Self-pay | Admitting: Cardiology

## 2016-01-25 NOTE — Telephone Encounter (Signed)
REFILL 

## 2016-02-23 DIAGNOSIS — R300 Dysuria: Secondary | ICD-10-CM | POA: Diagnosis not present

## 2016-02-29 DIAGNOSIS — S40811A Abrasion of right upper arm, initial encounter: Secondary | ICD-10-CM | POA: Diagnosis not present

## 2016-02-29 DIAGNOSIS — Z9181 History of falling: Secondary | ICD-10-CM | POA: Diagnosis not present

## 2016-02-29 DIAGNOSIS — S0181XA Laceration without foreign body of other part of head, initial encounter: Secondary | ICD-10-CM | POA: Diagnosis not present

## 2016-03-12 ENCOUNTER — Other Ambulatory Visit: Payer: Self-pay | Admitting: Internal Medicine

## 2016-03-14 DIAGNOSIS — R300 Dysuria: Secondary | ICD-10-CM | POA: Diagnosis not present

## 2016-03-24 ENCOUNTER — Other Ambulatory Visit: Payer: Self-pay | Admitting: Internal Medicine

## 2016-03-24 ENCOUNTER — Telehealth: Payer: Self-pay | Admitting: Internal Medicine

## 2016-03-24 NOTE — Telephone Encounter (Signed)
Spoke with patient's caregiver, [name] (DPR on file). She has written on the carvedilol bottle that patient is to take 1/2 tablet twice daily.   This has been his dose for several months.  She states Dr. Harrington Challenger told them to decrease this dose from whole tablet twice daily.  Pt saw Dr. Scotty Court last week, he sent a refill for carvedilol at the dose pt currently taking 6.25 mg --1/2 tablet BID.  Instructed caregiver to continue same dose pt has been taking.  Called to [place] to clarify patient instructions should be 6.25 mg take 1/2 tablet twice daily.   Made f/u appointment with Dr. Harrington Challenger for January.   Then saw that pt usually seen in [place].  Sent staff message to [place] office (T. Goins) to call and reschedule patient at that office instead.

## 2016-03-24 NOTE — Telephone Encounter (Signed)
New message  Pharmacy called, has 2 different RX for same meds, 2 different doctors  carvedilol 6.25mg   Dr. Matthias Hughs has called in same Rx, but 1/2 tab 2x daily  Please call pharmacy and advise

## 2016-03-24 NOTE — Telephone Encounter (Signed)
Follow up     Pt c/o medication issue:  1. Name of Medication: carvedilol  2. How are you currently taking this medication (dosage and times per day)? 6.25mg  3. Are you having a reaction (difficulty breathing--STAT)? no  4. What is your medication issue?  Dr Harrington Challenger called in a presc for 6.25 take 1 tab bid, and Dr Scotty Court called in a presc for 6.25 take 1/2 tab bid.  Pharmacist states that Dr Rayna Sexton office told them to call us and fill presc according to what we say.  Which dosage do you want them to fill?

## 2016-04-13 ENCOUNTER — Other Ambulatory Visit: Payer: Self-pay | Admitting: Internal Medicine

## 2016-04-20 ENCOUNTER — Ambulatory Visit (INDEPENDENT_AMBULATORY_CARE_PROVIDER_SITE_OTHER): Payer: Medicare Other | Admitting: Nurse Practitioner

## 2016-04-20 ENCOUNTER — Encounter: Payer: Self-pay | Admitting: Nurse Practitioner

## 2016-04-20 VITALS — BP 110/71 | HR 98 | Temp 97.6°F | Ht 75.25 in | Wt 194.6 lb

## 2016-04-20 DIAGNOSIS — R634 Abnormal weight loss: Secondary | ICD-10-CM | POA: Diagnosis not present

## 2016-04-20 DIAGNOSIS — R1310 Dysphagia, unspecified: Secondary | ICD-10-CM | POA: Diagnosis not present

## 2016-04-20 DIAGNOSIS — K269 Duodenal ulcer, unspecified as acute or chronic, without hemorrhage or perforation: Secondary | ICD-10-CM

## 2016-04-20 NOTE — Patient Instructions (Signed)
1. Continue taking your current medications. 2. Continue with the soft diet that you have been as it seems to be working very well for you. 3. Return for follow-up in one year. 4. If you have new or returning symptoms before then, you can call our office and we can see you sooner.

## 2016-04-20 NOTE — Progress Notes (Signed)
Referring Provider: [name], MD Primary Care Physician:  Deloria Lair, MD Primary GI:  Dr. Gala Romney  Chief Complaint  Patient presents with  . ulcer    f/u, not having any problems    HPI:   Edward Trevino is a 82 y.o. male who presents for follow-up on duodenal ulcer, dysphagia, and weight loss. Last seen in our office 10/20/15 for the same. He has an extensive, history of severe ulcerative gastritis and esophagitis with gastric outlet obstruction. The gastric outlet obstruction was dilated total of 2 times with continued obstruction over the course of multiple EGDs. His gastric ulcerations slowly improved over the course of serial endoscopies for evaluation and his last EGD was completed on 05/15/2015 found mild nonerosive gastritis in the gastric antrum status post multiple biopsies, stricture at the junction of the D1/D2 which was again dilated and unable to pass adult gastroscope after dilation, small clean based ulcer in the duodenal bulb, GE junction dilation completed.   At his last visit he was doing well, no dysphagia on soft diet, no GERD symptoms. No other upper or lower GI symptoms. Noted increasing weight of 10-12 pounds in the previous 3 omths, continue current diet, Protonix bid, and follow-up in 6 months  Today he states he's doing good overall. No more swelling issues, still on soft foods. Continues to gain weight appropriately. No GERD symptoms. Denies hematochezia, melena. Has regular soft bowel movements. Is off Ensure now. Denies N/V. Energy is doing ok, appetite is good. Has a companion dog Berton Bon") that he enjoys and keeps him company. Denies any other upper or lower GI symptoms.  Past Medical History:  Diagnosis Date  . Anxiety   . Bladder stones 05/22/2015  . Choledocholithiasis with obstruction 07/25/2012  . Depression   . Duodenitis 05/11/15  . Erosive esophagitis   . Esophageal stricture 05/11/15   Dilated  . Gastric out let obstruction   . Gastric  outlet obstruction 05/07/2015  . HOH (hard of hearing)   . Insomnia   . Ischemic cardiomyopathy    LVEF 20%  . Multiple duodenal ulcers 05/11/15   2  were present per EGD.  . ST elevation myocardial infarction (STEMI) of anterior wall [facility])    October 2016 - late presentation, managed conservatively  . Suicide attempt     Past Surgical History:  Procedure Laterality Date  . APPENDECTOMY    . BACK SURGERY    . BALLOON DILATION N/A 07/24/2012   Procedure: BALLOON DILATION;  Surgeon: Daneil Dolin, MD;  Location: [facility];  Service: Endoscopy;  Laterality: N/A;  Balloon Stone Extraction, Drudging  . BIOPSY N/A 07/24/2012   Procedure: BIOPSY;  Surgeon: Daneil Dolin, MD;  Location: [facility];  Service: Endoscopy;  Laterality: N/A;  Duodenal and Esophageal Biopsies  . BIOPSY N/A 10/18/2012   Procedure: BIOPSY;  Surgeon: Daneil Dolin, MD;  Location: [facility];  Service: Endoscopy;  Laterality: N/A;  . CHOLECYSTECTOMY N/A 10/26/2012   Procedure: LAPAROSCOPIC CHOLECYSTECTOMY;  Surgeon: Jamesetta So, MD;  Location: [facility];  Service: General;  Laterality: N/A;  . EGD/ERCP  10/18/2012   Dr. Gala Romney: ;yloric channel stenosis, s/p dilation and bx, persisting CBD stones with extension of sphincterotomy and sphincterotomy balloon dilation and stone extraction. Removal of biliary stent  . ERCP N/A 07/24/2012   Dr. Gala Romney. Significant abnormalities of the bowl and proximal second portion producing partial gastric outlet stricture and with secondary gastric dilation and severe erosive reflux esophagitis. Biopsy  showed benign ulceration. Normal-appearing ampulla, status post biliary sphincterotomy and ampulla balloon dilation, stone extraction and stent placement.  Marland Kitchen ERCP N/A 10/18/2012   Procedure: ENDOSCOPIC RETROGRADE CHOLANGIOPANCREATOGRAPHY (ERCP);  Surgeon: Daneil Dolin, MD;  Location: [facility];  Service: Endoscopy;  Laterality: N/A;  . ESOPHAGEAL DILATION N/A 05/15/2015   Procedure: ESOPHAGEAL DILATION;   Surgeon: Danie Binder, MD;  [facility];  Service: Endoscopy;  Laterality: N/A;  . ESOPHAGOGASTRODUODENOSCOPY N/A 07/24/2012   [id number] ampulla s/p biliary sphincterotomy and ampullary  . ESOPHAGOGASTRODUODENOSCOPY N/A 05/07/2015   RMR: Severe esophagitis, retained food in the stomach precluded complete exam, pyloric channel/duodenal bulb abnormal with 1 cm ulcer, friability with high-grade gastric outlet obstruction. No dilation performed on Plavix and aspirin.  . ESOPHAGOGASTRODUODENOSCOPY N/A 05/11/2015   Rehman: Distal esophageal ulcers with circumferential ulceration at distal 3-4 cm, soft stricture dilated with scope, diffuse erythema and edema in the bulbar mucosa with 2 small ulcers in the distal segment and high-grade stricture in the middle, scope could not be passed. Dilated up to 12 mm. Could not see duodenum beyond this point because the scope was retained in the large stomach.  . ESOPHAGOGASTRODUODENOSCOPY N/A 05/15/2015   SLF: Esophagitis with stricture, dilated GE junction stricture up to 14 mm with the balloon, D1/D2 stricture with narrowing of the lumen to 10-11 mm, dilated up to 15 mm with balloon. Downstream duodenum appear normal. Small duodenal bulbar ulcer. Biopsies from the stomach benign gastritis, no H pylori.  . ESOPHAGOGASTRODUODENOSCOPY (EGD) WITH PROPOFOL N/A 10/18/2012   [id number] channel stenosis s/p dilation/Persisting common bile duct stones with extension of sphincterotomy     and sphincterotomy balloon dilation and stone extraction.  Biliary stent removal   . LAPAROSCOPY N/A 10/31/2012   Procedure: LAPAROSCOPY DIAGNOSTIC;  Surgeon: Donato Heinz, MD;  Location: [facility];  Service: General;  Laterality: N/A;  . REMOVAL OF STONES N/A 07/24/2012   Procedure: REMOVAL OF STONES;  Surgeon: Daneil Dolin, MD;  Location: [facility];  Service: Endoscopy;  Laterality: N/A;  . REMOVAL OF STONES N/A 10/18/2012   Procedure: REMOVAL OF STONES;  Surgeon: Daneil Dolin, MD;  Location: [facility];  Service: Endoscopy;  Laterality: N/A;  . SPHINCTEROTOMY N/A 07/24/2012   Procedure: SPHINCTEROTOMY;  Surgeon: Daneil Dolin, MD;  Location: [facility];  Service: Endoscopy;  Laterality: N/A;    Current Outpatient Prescriptions  Medication Sig Dispense Refill  . atorvastatin (LIPITOR) 80 MG tablet TAKE ONE TABLET BY MOUTH ONCE DAILY AT 6PM 30 tablet 6  . carvedilol (COREG) 6.25 MG tablet TAKE ONE TABLET BY MOUTH TWICE DAILY WITH A MEAL --  **NEEDS  OFFICE  VISIT** (Patient taking differently: take 1/2 tablet (3.125 mg) twice daily) 60 tablet 11  . lisinopril (PRINIVIL,ZESTRIL) 2.5 MG tablet Take 1 tablet (2.5 mg total) by mouth daily. 90 tablet 3  . nitroGLYCERIN (NITRODUR - DOSED IN MG/24 HR) 0.4 mg/hr patch Place 1 patch (0.4 mg total) onto the skin daily. 30 patch 12  . pantoprazole (PROTONIX) 40 MG tablet Take 1 tablet (40 mg total) by mouth 2 (two) times daily before a meal. 60 tablet 1  . QUEtiapine (SEROQUEL) 100 MG tablet Take 100 mg by mouth at bedtime.    . Venlafaxine HCl 75 MG TB24 Take 75 mg by mouth at bedtime.    . nitroGLYCERIN (NITROSTAT) 0.4 MG SL tablet Place 1 tablet (0.4 mg total) under the tongue every 5 (five) minutes x 3 doses as needed for chest  pain. (Patient not taking: Reported on 04/20/2016) 25 tablet 2   No current facility-administered medications for this visit.     Allergies as of 04/20/2016  . (No Known Allergies)    Family History  Problem Relation Age of Onset  . Depression Sister   . Colon cancer Neg Hx     Social History   Social History  . Marital status: Single    Spouse name: N/A  . Number of children: N/A  . Years of education: N/A   Occupational History  . Retired    Social History Main Topics  . Smoking status: Current Every Day Smoker    Types: Cigars  . Smokeless tobacco: Never Used     Comment: Smokes 4-5 cigars a day "but I don't really inhale."  . Alcohol use No  . Drug use: No  . Sexual  activity: No   Other Topics Concern  . None   Social History Narrative  . None    Review of Systems: General: Negative for anorexia, weight loss, fever, chills, fatigue, weakness. ENT: Negative for hoarseness, difficulty swallowing (with soft diet). CV: Negative for chest pain, angina, palpitations, peripheral edema.  Respiratory: Negative for dyspnea at rest, cough, sputum, wheezing.  GI: See history of present illness. Endo: Negative for unusual weight change.  Heme: Negative for bruising or bleeding.   Physical Exam: BP 110/71   Pulse 98   Temp 97.6 F (36.4 C) (Oral)   Ht 6' 3.25" (1.911 m)   Wt 194 lb 9.6 oz (88.3 kg)   BMI 24.16 kg/m  General:   Alert and oriented. Pleasant and cooperative. Well-nourished and well-developed.  Head:  Normocephalic and atraumatic. Eyes:  Without icterus, sclera clear and conjunctiva pink.  Ears:  Normal auditory acuity. Cardiovascular:  S1, S2 present without murmurs appreciated. Extremities without clubbing or edema. Respiratory:  Clear to auscultation bilaterally. No wheezes, rales, or rhonchi. No distress.  Gastrointestinal:  +BS, rounded but soft, non-tender and non-distended. No HSM noted. No guarding or rebound. No masses appreciated.  Rectal:  Deferred  Musculoskalatal:  Symmetrical without gross deformities. Neurologic:  Alert and oriented x4;  grossly normal neurologically. Psych:  Alert and cooperative. Normal mood and affect. Heme/Lymph/Immune: No excessive bruising noted.    04/20/2016 1:57 PM   Disclaimer: This note was dictated with voice recognition software. Similar sounding words can inadvertently be transcribed and may not be corrected upon review.

## 2016-04-20 NOTE — Progress Notes (Signed)
cc'ed to pcp °

## 2016-04-20 NOTE — Assessment & Plan Note (Signed)
Previous duodenal ulcer which has slowly improved since 2016. Currently asymptomatic from a GI standpoint. Continue to monitor, return for follow-up as needed.

## 2016-04-20 NOTE — Assessment & Plan Note (Signed)
Remains on dysphagia 3 diet, no dysphagia on this diet. Recommend he continue soft diet and return for follow-up in one year or sooner if any worsening or recurrent symptoms.

## 2016-04-20 NOTE — Assessment & Plan Note (Signed)
He continues to add weight appropriately. Weight today is 194 pounds and was 183 pounds 6 months ago. Significant turnaround from his previous status about one year ago. He has been able to discontinue ensure supplementation. Eats well, good appetite. Return for follow-up in one year, or sooner if needed.

## 2016-05-10 ENCOUNTER — Encounter: Payer: Self-pay | Admitting: *Deleted

## 2016-05-27 ENCOUNTER — Ambulatory Visit: Payer: Self-pay | Admitting: Internal Medicine

## 2016-06-02 ENCOUNTER — Ambulatory Visit: Payer: Self-pay | Admitting: Adult Health

## 2016-06-03 ENCOUNTER — Other Ambulatory Visit: Payer: Self-pay | Admitting: Physician Assistant

## 2016-06-03 MED ORDER — NITROGLYCERIN 0.4 MG/HR TD PT24: 0.4000 mg | MEDICATED_PATCH | Freq: Every day | TRANSDERMAL | 12 refills | Status: DC

## 2016-06-03 NOTE — Telephone Encounter (Signed)
°*  STAT* If patient is at the pharmacy, call can be transferred to refill team.   1. Which medications need to be refilled? (please list name of each medication and dose if known)  nitroGLYCERIN (NITRODUR - DOSED IN MG/24 HR) 0.4 mg/hr patch [id number]    2. Which pharmacy/location (including street and city if local pharmacy) is medication to be sent to? [place]  3. Do they need a 30 day or 90 day supply?   Pt has an apt to see ML next

## 2016-06-03 NOTE — Telephone Encounter (Signed)
NTG patch refilled as requested

## 2016-06-08 ENCOUNTER — Encounter: Payer: Self-pay | Admitting: Physician Assistant

## 2016-06-08 ENCOUNTER — Ambulatory Visit (INDEPENDENT_AMBULATORY_CARE_PROVIDER_SITE_OTHER): Payer: Medicare Other | Admitting: Physician Assistant

## 2016-06-08 ENCOUNTER — Telehealth: Payer: Self-pay | Admitting: *Deleted

## 2016-06-08 ENCOUNTER — Other Ambulatory Visit (HOSPITAL_COMMUNITY)
Admission: RE | Admit: 2016-06-08 | Discharge: 2016-06-08 | Disposition: A | Discharge status: Home / Self Care | Payer: Medicare Other | Source: Ambulatory Visit | Attending: Physician Assistant | Admitting: Physician Assistant

## 2016-06-08 VITALS — BP 118/74 | HR 81 | Ht 75.0 in | Wt 195.0 lb

## 2016-06-08 DIAGNOSIS — I255 Ischemic cardiomyopathy: Secondary | ICD-10-CM | POA: Diagnosis not present

## 2016-06-08 DIAGNOSIS — I5022 Chronic systolic (congestive) heart failure: Secondary | ICD-10-CM

## 2016-06-08 DIAGNOSIS — E785 Hyperlipidemia, unspecified: Secondary | ICD-10-CM

## 2016-06-08 LAB — BASIC METABOLIC PANEL
ANION GAP: 7 (ref 5–15)
BUN: 13 mg/dL (ref 6–20)
CALCIUM: 8.6 mg/dL — AB (ref 8.9–10.3)
CO2: 25 mmol/L (ref 22–32)
Chloride: 107 mmol/L (ref 101–111)
Creatinine, Ser: 1.22 mg/dL (ref 0.61–1.24)
GFR calc Af Amer: >60 mL/min (ref >60)
GFR calc non Af Amer: 53 mL/min — ABNORMAL LOW (ref >60)
GLUCOSE: 99 mg/dL (ref 65–99)
Potassium: 4.2 mmol/L (ref 3.5–5.1)
Sodium: 139 mmol/L (ref 135–145)

## 2016-06-08 NOTE — Telephone Encounter (Signed)
Called patient with test results. No answer. Left message to call back.  

## 2016-06-08 NOTE — Patient Instructions (Signed)
Your physician wants you to follow-up in: 6 Months with Dr. Ross. You will receive a reminder letter in the mail two months in advance. If you don't receive a letter, please call our office to schedule the follow-up appointment.  Your physician recommends that you continue on your current medications as directed. Please refer to the Current Medication list given to you today.  If you need a refill on your cardiac medications before your next appointment, please call your pharmacy.  Thank you for choosing [facility]!   

## 2016-06-08 NOTE — Telephone Encounter (Signed)
-----   Message from Imogene Burn, PA-C sent at 06/08/2016  3:10 PM EST ----- Calcium a little low. Can take extra calcium vitamin or multivitamin daily

## 2016-06-08 NOTE — Progress Notes (Signed)
Cardiology Office Note    Date:  06/08/2016   ID:  Edward Trevino, DOB Jun 06, 1931, MRN [id number]  PCP:  Edward Lair, MD  Cardiologist: Dr. Dorris Trevino  Chief Complaint  Patient presents with  . Follow-up    History of Present Illness:  Edward Trevino is a 83 y.o. male with history of chronic systolic CHF LVEF [id number]. STEMI in October 2016 late presentation managed conservatively  Last saw Dr. Harrington Challenger 08/2015 and CHF was stable. She did consider addition of Entresto in the future. Labs reviewed from that office visit and were stable. Creatinine was 1.28 which is about baseline. Follow-up creatinine 10/20/15 1.06.  Patient comes in today accompanied by his caregiver. He has done pretty well over the past year. He gets dyspnea on exertion but otherwise has been stable. He denies any chest pain, palpitations, heart failure, edema, dizziness or presyncope. He doesn't take a diuretic as caregiver cooks for him daily. Complains of a dry cough on occasion and depression during the winter months.    Past Medical History:  Diagnosis Date  . Anxiety   . Bladder stones 05/22/2015  . Choledocholithiasis with obstruction 07/25/2012  . Depression   . Duodenitis 05/11/15  . Erosive esophagitis   . Esophageal stricture 05/11/15   Dilated  . Gastric out let obstruction   . Gastric outlet obstruction 05/07/2015  . HOH (hard of hearing)   . Insomnia   . Ischemic cardiomyopathy    LVEF 20%  . Multiple duodenal ulcers 05/11/15   2  were present per EGD.  . ST elevation myocardial infarction (STEMI) of anterior wall [facility])    October 2016 - late presentation, managed conservatively  . Suicide attempt     Past Surgical History:  Procedure Laterality Date  . APPENDECTOMY    . BACK SURGERY    . BALLOON DILATION N/A 07/24/2012   Procedure: BALLOON DILATION;  Surgeon: Daneil Dolin, MD;  Location: [facility];  Service: Endoscopy;  Laterality: N/A;  Balloon Stone Extraction, Drudging  . BIOPSY N/A 07/24/2012    Procedure: BIOPSY;  Surgeon: Daneil Dolin, MD;  Location: [facility];  Service: Endoscopy;  Laterality: N/A;  Duodenal and Esophageal Biopsies  . BIOPSY N/A 10/18/2012   Procedure: BIOPSY;  Surgeon: Daneil Dolin, MD;  Location: [facility];  Service: Endoscopy;  Laterality: N/A;  . CHOLECYSTECTOMY N/A 10/26/2012   Procedure: LAPAROSCOPIC CHOLECYSTECTOMY;  Surgeon: Jamesetta So, MD;  Location: [facility];  Service: General;  Laterality: N/A;  . EGD/ERCP  10/18/2012   Dr. Gala Romney: ;yloric channel stenosis, s/p dilation and bx, persisting CBD stones with extension of sphincterotomy and sphincterotomy balloon dilation and stone extraction. Removal of biliary stent  . ERCP N/A 07/24/2012   Dr. Gala Romney. Significant abnormalities of the bowl and proximal second portion producing partial gastric outlet stricture and with secondary gastric dilation and severe erosive reflux esophagitis. Biopsy showed benign ulceration. Normal-appearing ampulla, status post biliary sphincterotomy and ampulla balloon dilation, stone extraction and stent placement.  Marland Kitchen ERCP N/A 10/18/2012   Procedure: ENDOSCOPIC RETROGRADE CHOLANGIOPANCREATOGRAPHY (ERCP);  Surgeon: Daneil Dolin, MD;  Location: [facility];  Service: Endoscopy;  Laterality: N/A;  . ESOPHAGEAL DILATION N/A 05/15/2015   Procedure: ESOPHAGEAL DILATION;  Surgeon: Danie Binder, MD;  Location: [facility];  Service: Endoscopy;  Laterality: N/A;  . ESOPHAGOGASTRODUODENOSCOPY N/A 07/24/2012   [id number] ampulla s/p biliary sphincterotomy and ampullary  . ESOPHAGOGASTRODUODENOSCOPY N/A 05/07/2015   RMR: Severe esophagitis, retained food in the  stomach precluded complete exam, pyloric channel/duodenal bulb abnormal with 1 cm ulcer, friability with high-grade gastric outlet obstruction. No dilation performed on Plavix and aspirin.  . ESOPHAGOGASTRODUODENOSCOPY N/A 05/11/2015   Rehman: Distal esophageal ulcers with circumferential ulceration at distal 3-4 cm, soft stricture  dilated with scope, diffuse erythema and edema in the bulbar mucosa with 2 small ulcers in the distal segment and high-grade stricture in the middle, scope could not be passed. Dilated up to 12 mm. Could not see duodenum beyond this point because the scope was retained in the large stomach.  . ESOPHAGOGASTRODUODENOSCOPY N/A 05/15/2015   SLF: Esophagitis with stricture, dilated GE junction stricture up to 14 mm with the balloon, D1/D2 stricture with narrowing of the lumen to 10-11 mm, dilated up to 15 mm with balloon. Downstream duodenum appear normal. Small duodenal bulbar ulcer. Biopsies from the stomach benign gastritis, no H pylori.  . ESOPHAGOGASTRODUODENOSCOPY (EGD) WITH PROPOFOL N/A 10/18/2012   [id number] channel stenosis s/p dilation/Persisting common bile duct stones with extension of sphincterotomy     and sphincterotomy balloon dilation and stone extraction.  Biliary stent removal   . LAPAROSCOPY N/A 10/31/2012   Procedure: LAPAROSCOPY DIAGNOSTIC;  Surgeon: Donato Heinz, MD;  Location: [facility];  Service: General;  Laterality: N/A;  . REMOVAL OF STONES N/A 07/24/2012   Procedure: REMOVAL OF STONES;  Surgeon: Daneil Dolin, MD;  Location: [facility];  Service: Endoscopy;  Laterality: N/A;  . REMOVAL OF STONES N/A 10/18/2012   Procedure: REMOVAL OF STONES;  Surgeon: Daneil Dolin, MD;  Location: [facility];  Service: Endoscopy;  Laterality: N/A;  . SPHINCTEROTOMY N/A 07/24/2012   Procedure: SPHINCTEROTOMY;  Surgeon: Daneil Dolin, MD;  Location: [facility];  Service: Endoscopy;  Laterality: N/A;    Current Medications: Outpatient Medications Prior to Visit  Medication Sig Dispense Refill  . atorvastatin (LIPITOR) 80 MG tablet TAKE ONE TABLET BY MOUTH ONCE DAILY AT 6PM 30 tablet 6  . carvedilol (COREG) 6.25 MG tablet TAKE ONE TABLET BY MOUTH TWICE DAILY WITH A MEAL --  **NEEDS  OFFICE  VISIT** (Patient taking differently: take 1/2 tablet (3.125 mg) twice daily) 60 tablet 11  . lisinopril  (PRINIVIL,ZESTRIL) 2.5 MG tablet Take 1 tablet (2.5 mg total) by mouth daily. 90 tablet 3  . nitroGLYCERIN (NITRODUR - DOSED IN MG/24 HR) 0.4 mg/hr patch Place 1 patch (0.4 mg total) onto the skin daily. 30 patch 12  . pantoprazole (PROTONIX) 40 MG tablet Take 1 tablet (40 mg total) by mouth 2 (two) times daily before a meal. 60 tablet 1  . QUEtiapine (SEROQUEL) 100 MG tablet Take 100 mg by mouth at bedtime.    . Venlafaxine HCl 75 MG TB24 Take 75 mg by mouth at bedtime.     No facility-administered medications prior to visit.      Allergies:   Patient has no known allergies.   Social History   Social History  . Marital status: Single    Spouse name: N/A  . Number of children: N/A  . Years of education: N/A   Occupational History  . Retired    Social History Main Topics  . Smoking status: Current Every Day Smoker    Types: Cigars  . Smokeless tobacco: Never Used     Comment: Smokes 4-5 cigars a day "but I don't really inhale."  . Alcohol use No  . Drug use: No  . Sexual activity: No   Other Topics Concern  . None   Social  History Narrative  . None     Family History:  The patient's  family history includes Depression in his sister.   ROS:   Please see the history of present illness.    Review of Systems  Constitution: Positive for malaise/fatigue.  HENT: Negative.   Cardiovascular: Negative.   Respiratory: Positive for cough.   Endocrine: Negative.   Hematologic/Lymphatic: Negative.   Musculoskeletal: Negative.   Gastrointestinal: Negative.   Genitourinary: Negative.   Neurological: Negative.   Psychiatric/Behavioral: Positive for depression.   All other systems reviewed and are negative.   PHYSICAL EXAM:   VS:  BP 118/74   Pulse 81   Ht 6\' 3"  (1.905 m)   Wt 195 lb (88.5 kg)   SpO2 93%   BMI 24.37 kg/m   Physical Exam  GEN: Well nourished, well developed, in no acute distress  Neck: no JVD, carotid bruits, or masses Cardiac:RRR; no murmurs, rubs, or  gallops  Respiratory:  Decreased breath sounds but clear to auscultation bilaterally, normal work of breathing GI: soft, nontender, nondistended, + BS Ext: without cyanosis, clubbing, or edema, Good distal pulses bilaterally Psych: euthymic mood, full affect  Wt Readings from Last 3 Encounters:  06/08/16 195 lb (88.5 kg)  04/20/16 194 lb 9.6 oz (88.3 kg)  10/20/15 182 lb 12.8 oz (82.9 kg)      Studies/Labs Reviewed:   EKG:  EKG is  ordered today.  The ekg ordered today demonstrates Normal sinus rhythm first degree AV block poor R wave progression anteriorly, no acute change  Recent Labs: 06/11/2015: ALT 14 09/10/2015: Hemoglobin 12.9; Platelets 212 10/20/2015: BUN 12; Creatinine, Ser 1.06; Potassium 4.5; Sodium 143   Lipid Panel    Component Value Date/Time   CHOL 90 (L) 09/10/2015 0926   TRIG 90 09/10/2015 0926   HDL 45 09/10/2015 0926   CHOLHDL 2.0 09/10/2015 0926   VLDL 18 09/10/2015 0926   LDLCALC 27 09/10/2015 0926    Additional studies/ records that were reviewed today include:   2-D echo 02/28/15 Study Conclusions   - Left ventricle: LVEF is approximately 20% with akinesis of the   mid/distal septum, distal lateral, mid/distal anteiror, distal   inferior and apical walls. The cavity size was normal. Wall   thickness was increased in a pattern of mild LVH. - Aortic valve: There was trivial regurgitation. - Right atrium: The atrium was mildly dilated. - Pulmonary arteries: PA peak pressure: 35 mm Hg (S).    ASSESSMENT:    1. Chronic systolic CHF (congestive heart failure) (HCC)   2. Cardiomyopathy, ischemic-EF 20%   3. Dyslipidemia      PLAN:  In order of problems listed above:  Chronic systolic heart failure well compensated. He is not on a diuretic. He is on low dose lisinopril and carvedilol. Follow-up BMET today. Dr. Harrington Challenger 6 months or sooner if needed.  Ischemic cardiomyopathy EF 20% no heart failure or chest pain. Continue current  treatment  Dyslipidemia on Lipitor. Check fasting lipid panel next office visit.    Medication Adjustments/Labs and Tests Ordered: Current medicines are reviewed at length with the patient today.  Concerns regarding medicines are outlined above.  Medication changes, Labs and Tests ordered today are listed in the Patient Instructions below. There are no Patient Instructions on file for this visit.   Signed, Ermalinda Barrios, PA-C  06/08/2016 1:37 PM    [facility] [address], [place], [place]  [address] Phone: [phone]; Fax: [phone]

## 2016-06-13 ENCOUNTER — Encounter: Payer: Self-pay | Admitting: *Deleted

## 2016-06-28 ENCOUNTER — Other Ambulatory Visit: Payer: Self-pay | Admitting: Internal Medicine

## 2016-08-17 DIAGNOSIS — I739 Peripheral vascular disease, unspecified: Secondary | ICD-10-CM | POA: Diagnosis not present

## 2016-08-17 DIAGNOSIS — F4310 Post-traumatic stress disorder, unspecified: Secondary | ICD-10-CM | POA: Diagnosis not present

## 2016-08-17 DIAGNOSIS — F5104 Psychophysiologic insomnia: Secondary | ICD-10-CM | POA: Diagnosis not present

## 2016-08-17 DIAGNOSIS — K311 Adult hypertrophic pyloric stenosis: Secondary | ICD-10-CM | POA: Diagnosis not present

## 2016-08-17 DIAGNOSIS — I255 Ischemic cardiomyopathy: Secondary | ICD-10-CM | POA: Diagnosis not present

## 2016-09-14 ENCOUNTER — Telehealth: Payer: Self-pay | Admitting: Internal Medicine

## 2016-09-14 MED ORDER — LISINOPRIL 2.5 MG PO TABS: 2.5000 mg | ORAL_TABLET | Freq: Every day | ORAL | 3 refills | Status: DC

## 2016-09-14 NOTE — Telephone Encounter (Signed)
Refill on Lisinopril to [facility] / tg

## 2016-11-18 ENCOUNTER — Other Ambulatory Visit: Payer: Self-pay | Admitting: Internal Medicine

## 2017-02-12 IMAGING — RF DG UGI W/ GASTROGRAFIN
19 of 24 series · 19 of 24 positions shown · IV contrast (agent unspecified)
Comparison: None

CLINICAL DATA: Duodenal stricture post dilatation

EXAM:
WATER SOLUBLE UPPER GI SERIES
TECHNIQUE: Single-column upper GI series was performed using water soluble
contrast.
CONTRAST:  150 mL 5mnipaque-GVV orally

[Series 1: run · 1 of 1 slices shown (1 of 19)]
[im 1/1]
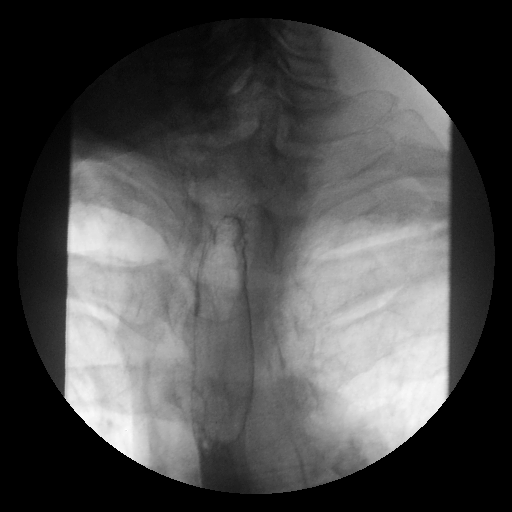

[Series 2: run · 1 of 1 slices shown (2 of 19)]
[im 1/1]
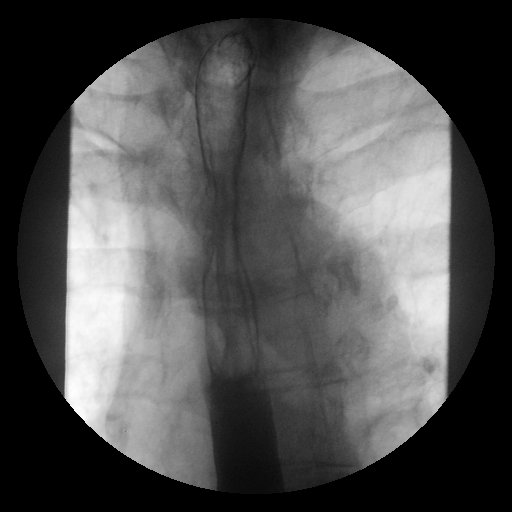

[Series 4: run · 1 of 1 slices shown (3 of 19)]
[im 1/1]
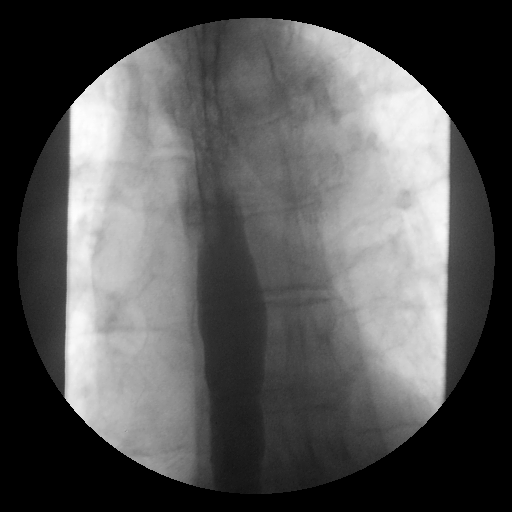

[Series 5: run · 1 of 1 slices shown (4 of 19)]
[im 1/1]
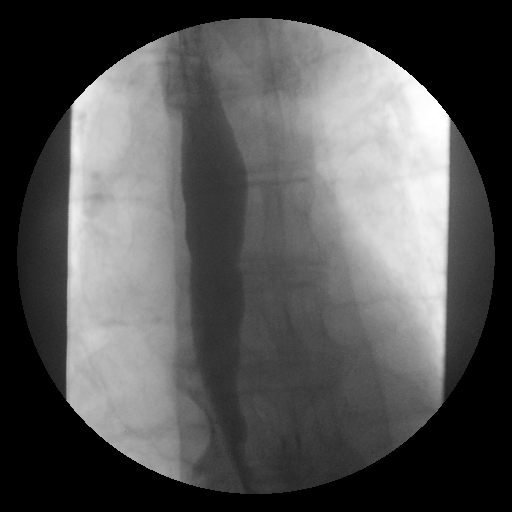

[Series 6: run · 1 of 1 slices shown (5 of 19)]
[im 1/1]
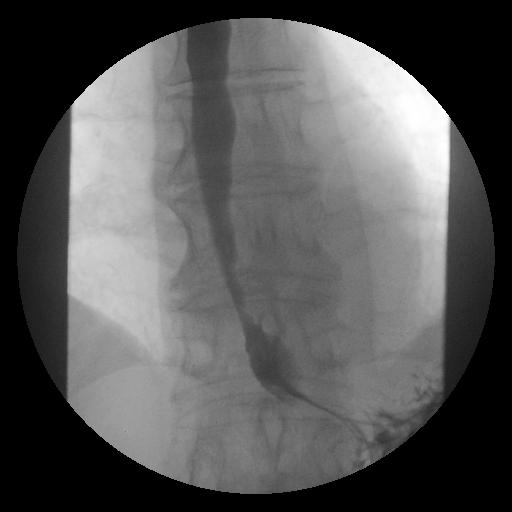

[Series 7: run · 1 of 1 slices shown (6 of 19)]
[im 1/1]
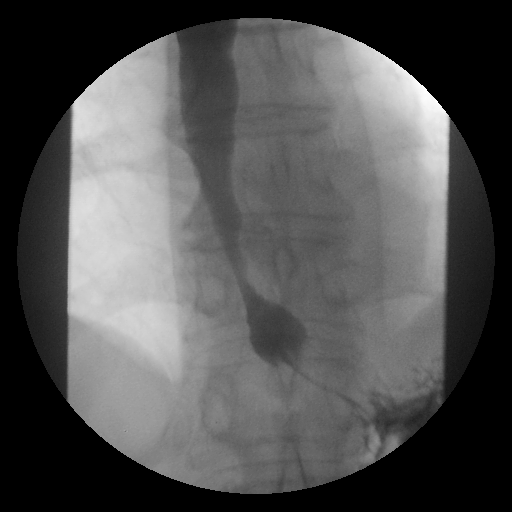

[Series 9: run · 1 of 1 slices shown (7 of 19)]
[im 1/1]
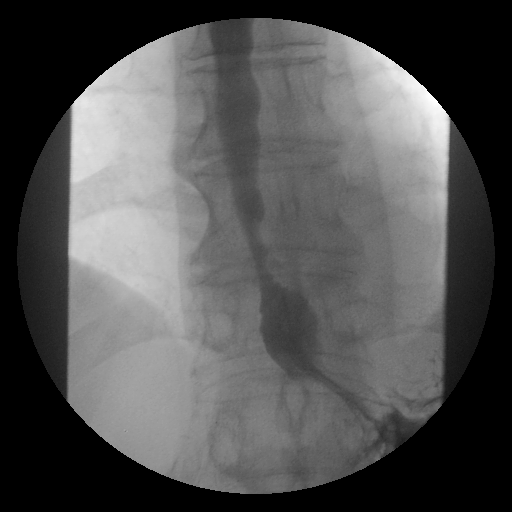

[Series 10: run · 1 of 1 slices shown (8 of 19)]
[im 1/1]
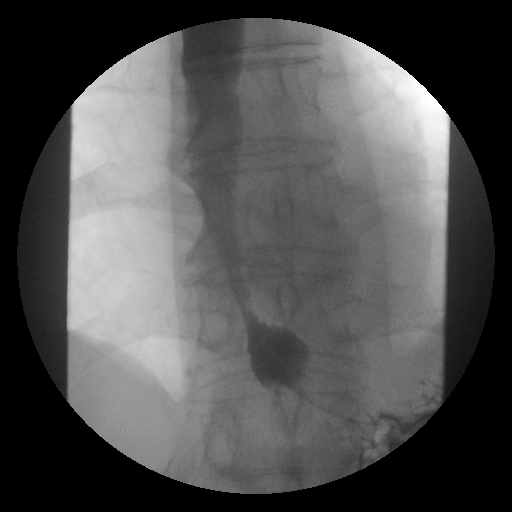

[Series 11: run · 1 of 1 slices shown (9 of 19)]
[im 1/1]
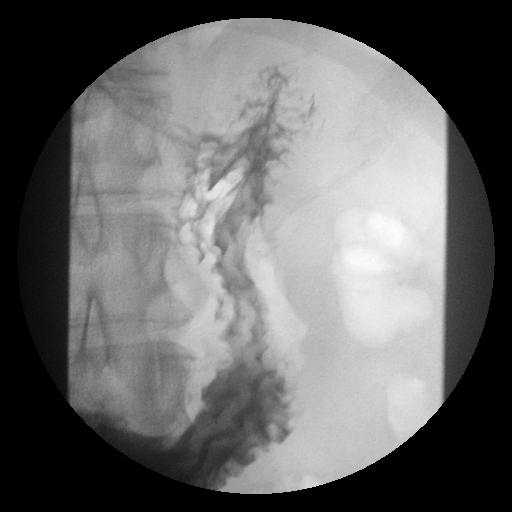

[Series 13: run · 1 of 1 slices shown (10 of 19)]
[im 1/1]
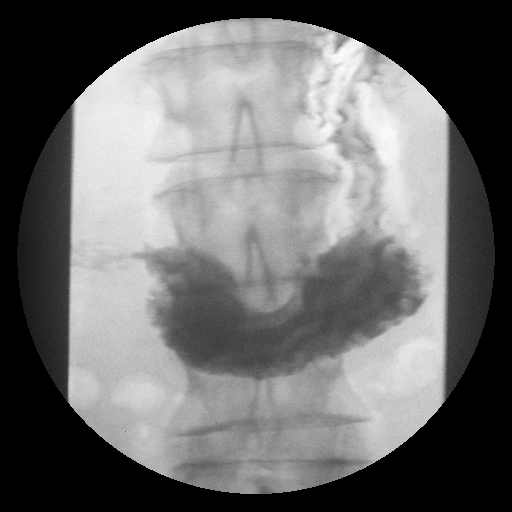

[Series 14: run · 1 of 1 slices shown (11 of 19)]
[im 1/1]
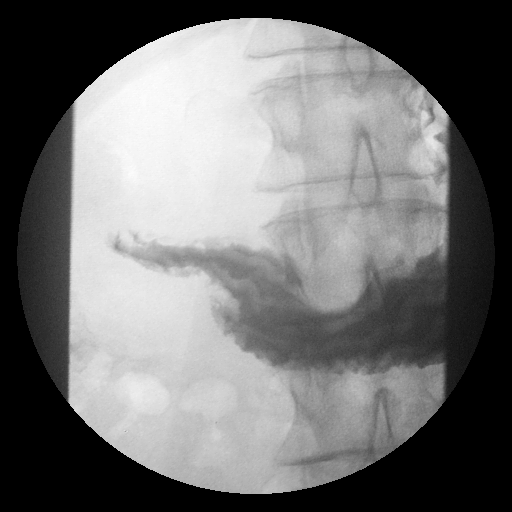

[Series 15: run · 1 of 1 slices shown (12 of 19)]
[im 1/1]
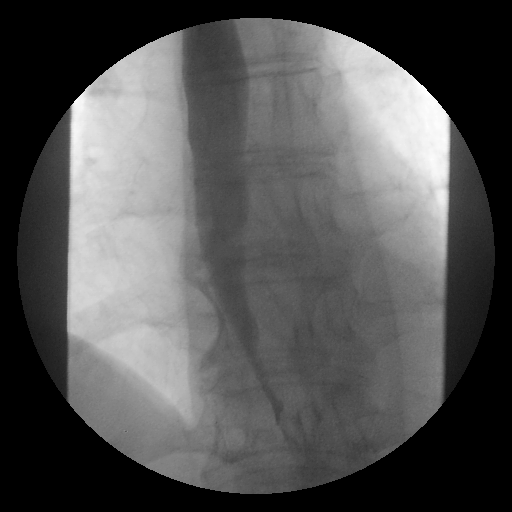

[Series 16: run · 1 of 1 slices shown (13 of 19)]
[im 1/1]
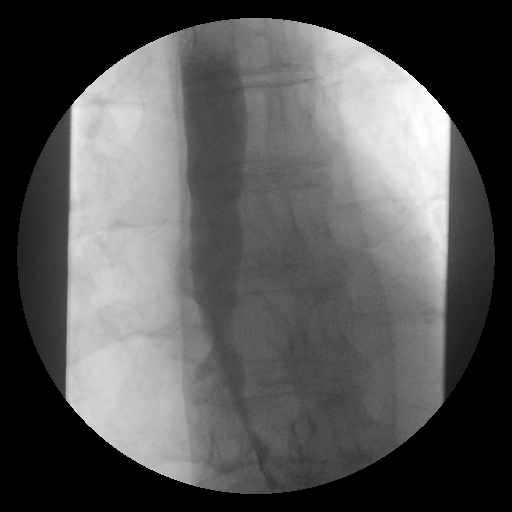

[Series 18: run · 1 of 1 slices shown (14 of 19)]
[im 1/1]
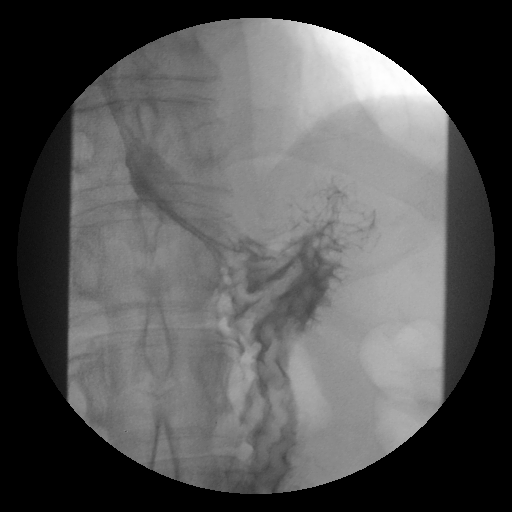

[Series 19: run · 1 of 1 slices shown (15 of 19)]
[im 1/1]
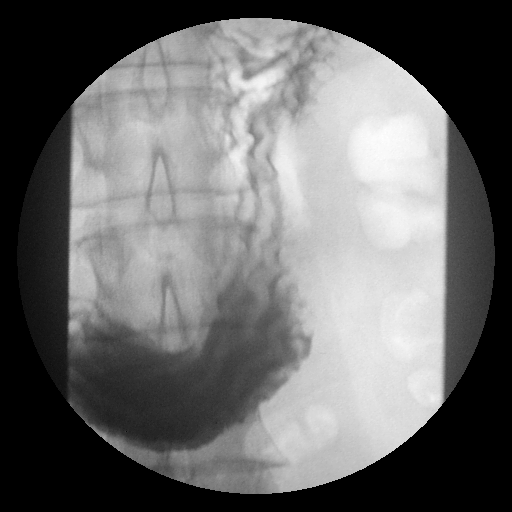

[Series 20: run · 1 of 1 slices shown (16 of 19)]
[im 1/1]
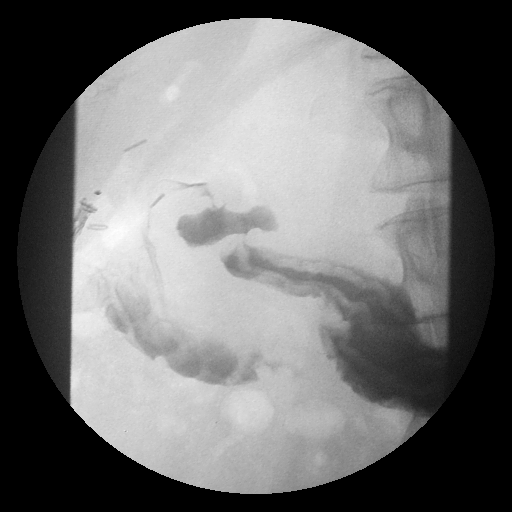

[Series 21: run · 1 of 1 slices shown (17 of 19)]
[im 1/1]
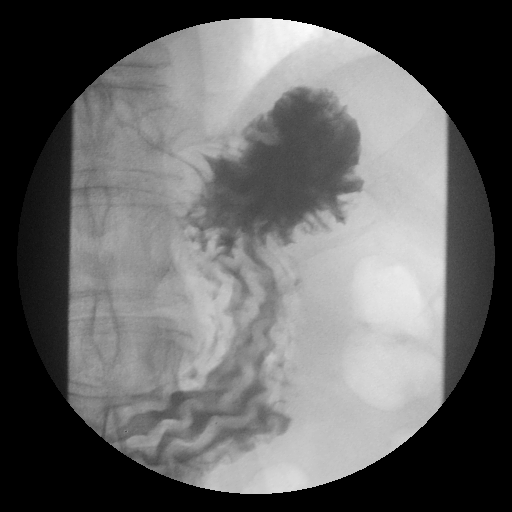

[Series 23: run · 1 of 1 slices shown (18 of 19)]
[im 1/1]
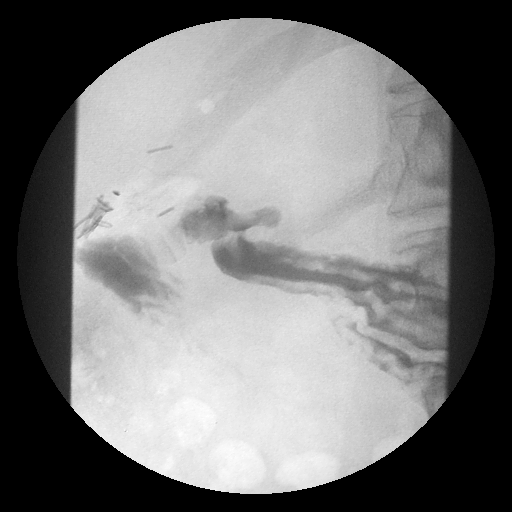

[Series 24: run · 1 of 1 slices shown (19 of 19)]
[im 1/1]
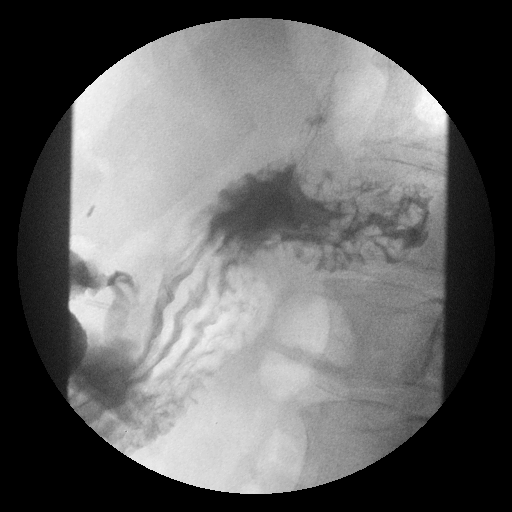

[19 of 24 positions shown; findings below may reference images not displayed]

FLUOROSCOPY TIME:  Radiation Exposure Index (as provided by the
fluoroscopic device): Not provided

If the device does not provide the exposure index:

Fluoroscopy Time (in minutes and seconds):  4 minutes 12 seconds

Number of Acquired Images: 2 plus multiple screen captures during
fluoroscopy
FINDINGS: Normal bowel gas pattern on scout image.

Surgical clips RIGHT upper quadrant.

Emphysematous changes at lung bases.

Diffuse impairment of esophageal motility with incomplete clearance
of barium by primary peristaltic waves.

Numerous secondary and tertiary waves noted.

Stricture at the distal esophagus, obstructing a 12.5 mm diameter
barium tablet approximately 2 vertebral body heights above the GE
junction.

No other esophageal abnormalities identified.

Stomach is decompressed with normal rugal fold pattern.

No obvious gastric mass or ulceration.

No significant retained food debris.

Normal emptying of contrast across the pylorus into the proximal
duodenum.

Mild bowel wall thickening at the duodenal bulb and proximal
descending duodenum with dilatation of the descending duodenum.

However contrast easily passes beyond the duodenum into nondilated
proximal small bowel loops.

No contrast extravasation identified.
IMPRESSION: Distal esophageal stricture obstructing a 12.5 mm diameter barium
tablet 3-5 cm estimated above the gastroesophageal junction.

No evidence of gastric outlet obstruction.

Mild dilatation of the descending duodenum without obstruction.

## 2017-03-01 DIAGNOSIS — Z23 Encounter for immunization: Secondary | ICD-10-CM | POA: Diagnosis not present

## 2017-03-20 DIAGNOSIS — S51011A Laceration without foreign body of right elbow, initial encounter: Secondary | ICD-10-CM | POA: Diagnosis not present

## 2017-03-20 DIAGNOSIS — Z23 Encounter for immunization: Secondary | ICD-10-CM | POA: Diagnosis not present

## 2017-03-27 ENCOUNTER — Encounter: Payer: Self-pay | Admitting: Internal Medicine

## 2017-06-16 ENCOUNTER — Other Ambulatory Visit: Payer: Self-pay | Admitting: Internal Medicine

## 2017-06-23 ENCOUNTER — Other Ambulatory Visit: Payer: Self-pay | Admitting: Internal Medicine

## 2017-07-25 ENCOUNTER — Other Ambulatory Visit: Payer: Self-pay | Admitting: Internal Medicine

## 2017-07-25 NOTE — Addendum Note (Signed)
Addended by: Levonne Hubert on: 07/25/2017 03:19 PM   Modules accepted: Orders

## 2018-02-20 ENCOUNTER — Ambulatory Visit (INDEPENDENT_AMBULATORY_CARE_PROVIDER_SITE_OTHER): Payer: Medicare Other | Admitting: Physician Assistant

## 2018-02-20 ENCOUNTER — Other Ambulatory Visit (HOSPITAL_COMMUNITY)
Admission: RE | Admit: 2018-02-20 | Discharge: 2018-02-20 | Disposition: A | Discharge status: Home / Self Care | Payer: Medicare Other | Source: Ambulatory Visit | Attending: Physician Assistant | Admitting: Physician Assistant

## 2018-02-20 ENCOUNTER — Encounter: Payer: Self-pay | Admitting: Physician Assistant

## 2018-02-20 VITALS — BP 130/70 | HR 99 | Ht 75.0 in | Wt 176.0 lb

## 2018-02-20 DIAGNOSIS — I2510 Atherosclerotic heart disease of native coronary artery without angina pectoris: Secondary | ICD-10-CM | POA: Diagnosis not present

## 2018-02-20 DIAGNOSIS — R634 Abnormal weight loss: Secondary | ICD-10-CM | POA: Diagnosis not present

## 2018-02-20 DIAGNOSIS — I4891 Unspecified atrial fibrillation: Secondary | ICD-10-CM | POA: Diagnosis present

## 2018-02-20 DIAGNOSIS — I5022 Chronic systolic (congestive) heart failure: Secondary | ICD-10-CM | POA: Diagnosis not present

## 2018-02-20 DIAGNOSIS — Z79899 Other long term (current) drug therapy: Secondary | ICD-10-CM | POA: Diagnosis present

## 2018-02-20 DIAGNOSIS — R748 Abnormal levels of other serum enzymes: Secondary | ICD-10-CM | POA: Diagnosis not present

## 2018-02-20 LAB — LIPID PANEL
CHOL/HDL RATIO: 2.2 ratio
CHOLESTEROL: 74 mg/dL (ref 0–200)
HDL: 33 mg/dL — ABNORMAL LOW (ref >40)
LDL Cholesterol: 23 mg/dL (ref 0–99)
Triglycerides: 89 mg/dL (ref <150)
VLDL: 18 mg/dL (ref 0–40)

## 2018-02-20 LAB — COMPREHENSIVE METABOLIC PANEL
ALBUMIN: 3.6 g/dL (ref 3.5–5.0)
ALT: 16 U/L (ref 0–44)
AST: 21 U/L (ref 15–41)
Alkaline Phosphatase: 169 U/L — ABNORMAL HIGH (ref 38–126)
Anion gap: 6 (ref 5–15)
BILIRUBIN TOTAL: 0.6 mg/dL (ref 0.3–1.2)
BUN: 10 mg/dL (ref 8–23)
CHLORIDE: 108 mmol/L (ref 98–111)
CO2: 25 mmol/L (ref 22–32)
CREATININE: 1.05 mg/dL (ref 0.61–1.24)
Calcium: 8.4 mg/dL — ABNORMAL LOW (ref 8.9–10.3)
GFR calc Af Amer: >60 mL/min (ref >60)
GLUCOSE: 110 mg/dL — AB (ref 70–99)
POTASSIUM: 4.3 mmol/L (ref 3.5–5.1)
Sodium: 139 mmol/L (ref 135–145)
TOTAL PROTEIN: 7 g/dL (ref 6.5–8.1)

## 2018-02-20 NOTE — Progress Notes (Signed)
Cardiology Office Note Date:  02/20/2018   ID:  Edward Trevino, DOB 10/17/1931, MRN [id number]  Patient Care Team: [name], MD as PCP - General (Unknown Physician Specialty) -Per caretaker, Dr. Scotty Court has retired and he has another PCP, but she does not remember the name Fay Records, MD as PCP - Cardiology (Cardiology) Gala Romney Cristopher Estimable, MD as Consulting Physician (Gastroenterology)  Rosaria Ferries, PA-C    History of Present Illness: Edward Trevino is a 83 y.o. male who presents for [facility], STEMI 2016 w/ med rx, GERD/esophagitis, depression, [facility]  06/08/2016 visit w/ M. Lenze, PAC, volume ok at 195 lbs, did not add Entresto, BMET and lipids at next visit.  Caretaker (x 4 years), [name], is with him today.    He gets dizzy if he gets up fast. No recent falls.  He sits on the porch a lot.   Family lives on the property, they keep an eye on him.  His caretaker is with him daily.  Wakes with a little swelling in the L leg, caretaker is not so sure.   He eats a low sodium diet. Caretaker cooks for him.  Chronic DOE, no recent change.   Never c/o chest pain.   Activity level has gradually decreased over time. This is mainly related to cognitive issues.  Appetite is poor, takes appetite stimulant. Has been losing weight gradually, rarely eats lunch. Eats pretty well at breakfast and supper.  [name] cooks for him.  He threw away his dentures, so has to have soft food.  For a while, he was drinking an Ensure daily, but stopped doing that.  She works at [employer], but he will only agree to change close once a week and that is on Fridays.   Past Medical History:  Diagnosis Date  . Anxiety   . Bladder stones 05/22/2015  . Choledocholithiasis with obstruction 07/25/2012  . Depression   . Duodenitis 05/11/15  . Erosive esophagitis   . Esophageal stricture 05/11/15   Dilated  . Gastric out let obstruction   . Gastric outlet obstruction 05/07/2015  . HOH (hard of  hearing)   . Insomnia   . Ischemic cardiomyopathy    LVEF 20%  . Multiple duodenal ulcers 05/11/15   2  were present per EGD.  . ST elevation myocardial infarction (STEMI) of anterior wall [facility])    October 2016 - late presentation, managed conservatively  . Suicide attempt [facility])     Past Surgical History:  Procedure Laterality Date  . APPENDECTOMY    . BACK SURGERY    . BALLOON DILATION N/A 07/24/2012   Procedure: BALLOON DILATION;  Surgeon: Daneil Dolin, MD;  Location: [facility] ORS;  Service: Endoscopy;  Laterality: N/A;  Balloon Stone Extraction, Drudging  . BIOPSY N/A 07/24/2012   Procedure: BIOPSY;  Surgeon: Daneil Dolin, MD;  Location: [facility] ORS;  Service: Endoscopy;  Laterality: N/A;  Duodenal and Esophageal Biopsies  . BIOPSY N/A 10/18/2012   Procedure: BIOPSY;  Surgeon: Daneil Dolin, MD;  Location: [facility] ORS;  Service: Endoscopy;  Laterality: N/A;  . CHOLECYSTECTOMY N/A 10/26/2012   Procedure: LAPAROSCOPIC CHOLECYSTECTOMY;  Surgeon: Jamesetta So, MD;  Location: [facility] ORS;  Service: General;  Laterality: N/A;  . EGD/ERCP  10/18/2012   Dr. Gala Romney: ;yloric channel stenosis, s/p dilation and bx, persisting CBD stones with extension of sphincterotomy and sphincterotomy balloon dilation and stone extraction. Removal of biliary stent  . ERCP N/A 07/24/2012   Dr.  Rourk. Significant abnormalities of the bowl and proximal second portion producing partial gastric outlet stricture and with secondary gastric dilation and severe erosive reflux esophagitis. Biopsy showed benign ulceration. Normal-appearing ampulla, status post biliary sphincterotomy and ampulla balloon dilation, stone extraction and stent placement.  Marland Kitchen ERCP N/A 10/18/2012   Procedure: ENDOSCOPIC RETROGRADE CHOLANGIOPANCREATOGRAPHY (ERCP);  Surgeon: Daneil Dolin, MD;  Location: [facility] ORS;  Service: Endoscopy;  Laterality: N/A;  . ESOPHAGEAL DILATION N/A 05/15/2015   Procedure: ESOPHAGEAL DILATION;  Surgeon: Danie Binder, MD;  Location: [facility] ENDO  SUITE;  Service: Endoscopy;  Laterality: N/A;  . ESOPHAGOGASTRODUODENOSCOPY N/A 07/24/2012   FBP:[id number] ampulla s/p biliary sphincterotomy and ampullary  . ESOPHAGOGASTRODUODENOSCOPY N/A 05/07/2015   RMR: Severe esophagitis, retained food in the stomach precluded complete exam, pyloric channel/duodenal bulb abnormal with 1 cm ulcer, friability with high-grade gastric outlet obstruction. No dilation performed on Plavix and aspirin.  . ESOPHAGOGASTRODUODENOSCOPY N/A 05/11/2015   Rehman: Distal esophageal ulcers with circumferential ulceration at distal 3-4 cm, soft stricture dilated with scope, diffuse erythema and edema in the bulbar mucosa with 2 small ulcers in the distal segment and high-grade stricture in the middle, scope could not be passed. Dilated up to 12 mm. Could not see duodenum beyond this point because the scope was retained in the large stomach.  . ESOPHAGOGASTRODUODENOSCOPY N/A 05/15/2015   SLF: Esophagitis with stricture, dilated GE junction stricture up to 14 mm with the balloon, D1/D2 stricture with narrowing of the lumen to 10-11 mm, dilated up to 15 mm with balloon. Downstream duodenum appear normal. Small duodenal bulbar ulcer. Biopsies from the stomach benign gastritis, no H pylori.  . ESOPHAGOGASTRODUODENOSCOPY (EGD) WITH PROPOFOL N/A 10/18/2012   TIR:WERXVQM channel stenosis s/p dilation/Persisting common bile duct stones with extension of sphincterotomy     and sphincterotomy balloon dilation and stone extraction.  Biliary stent removal   . LAPAROSCOPY N/A 10/31/2012   Procedure: LAPAROSCOPY DIAGNOSTIC;  Surgeon: Donato Heinz, MD;  Location: [facility] ORS;  Service: General;  Laterality: N/A;  . REMOVAL OF STONES N/A 07/24/2012   Procedure: REMOVAL OF STONES;  Surgeon: Daneil Dolin, MD;  Location: [facility] ORS;  Service: Endoscopy;  Laterality: N/A;  . REMOVAL OF STONES N/A 10/18/2012   Procedure: REMOVAL OF STONES;  Surgeon: Daneil Dolin, MD;  Location: [facility] ORS;  Service:  Endoscopy;  Laterality: N/A;  . SPHINCTEROTOMY N/A 07/24/2012   Procedure: SPHINCTEROTOMY;  Surgeon: Daneil Dolin, MD;  Location: [facility] ORS;  Service: Endoscopy;  Laterality: N/A;    Prior to Admission medications   Medication Sig Start Date End Date Taking? Authorizing Provider  atorvastatin (LIPITOR) 80 MG tablet TAKE 1 TABLET BY MOUTH ONCE DAILY AT  6  PM 07/25/17   Fay Records, MD  carvedilol (COREG) 6.25 MG tablet TAKE ONE TABLET BY MOUTH TWICE DAILY WITH A MEAL --  **NEEDS  OFFICE  VISIT** Patient taking differently: take 1/2 tablet (3.125 mg) twice daily 03/24/16   Fay Records, MD  lisinopril (PRINIVIL,ZESTRIL) 2.5 MG tablet Take 1 tablet (2.5 mg total) by mouth daily. 09/14/16   Fay Records, MD  nitroGLYCERIN (NITRODUR - DOSED IN MG/24 HR) 0.4 mg/hr patch APPLY ONE PATCH TOPICALLY ONCE DAILY 06/16/17   Fay Records, MD  pantoprazole (PROTONIX) 40 MG tablet Take 1 tablet (40 mg total) by mouth 2 (two) times daily before a meal. 08/25/15   Fay Records, MD  QUEtiapine (SEROQUEL) 100 MG tablet Take 100 mg by mouth at bedtime.  01/28/15   [provider]  Venlafaxine HCl 75 MG TB24 Take 75 mg by mouth at bedtime. 01/28/15   [provider]    Allergies:   Patient has no known allergies.   Social History:  The patient  reports that he has been smoking cigars. He has never used smokeless tobacco. He reports that he does not drink alcohol or use drugs. Retta Diones is Single  Social History   Socioeconomic History  . Marital status: Single    Spouse name: Not on file  . Number of children: Not on file  . Years of education: Not on file  . Highest education level: Not on file  Occupational History  . Occupation: Retired  Scientific laboratory technician  . Financial resource strain: Not on file  . Food insecurity:    Worry: Not on file    Inability: Not on file  . Transportation needs:    Medical: Not on file    Non-medical: Not on file  Tobacco Use  . Smoking status: Current Every  Day Smoker    Types: Cigars  . Smokeless tobacco: Never Used  . Tobacco comment: Smokes 4-5 cigars a day "but I don't really inhale."  Substance and Sexual Activity  . Alcohol use: No    Alcohol/week: 0.0 standard drinks  . Drug use: No  . Sexual activity: Never  Lifestyle  . Physical activity:    Days per week: Not on file    Minutes per session: Not on file  . Stress: Not on file  Relationships  . Social connections:    Talks on phone: Not on file    Gets together: Not on file    Attends religious service: Not on file    Active member of club or organization: Not on file    Attends meetings of clubs or organizations: Not on file    Relationship status: Not on file  . Intimate partner violence:    Fear of current or ex partner: Not on file    Emotionally abused: Not on file    Physically abused: Not on file    Forced sexual activity: Not on file  Other Topics Concern  . Not on file  Social History Narrative  . Not on file    Family History:   Family Status  Relation Name Status  . Sister  Deceased  . MGM  Deceased  . MGF  Deceased  . PGM  Deceased  . PGF  Deceased  . Mother  Deceased  . Father  Deceased  . Neg Hx  (Not Specified)   Family History  Problem Relation Age of Onset  . Depression Sister   . Colon cancer Neg Hx   . Heart disease Neg Hx     ROS:  Please see the history of present illness.   Otherwise, review of systems are negative.   PHYSICAL EXAM: VS:  BP 130/70   Pulse 99   Ht 6\' 3"  (1.905 m)   Wt 176 lb (79.8 kg)   SpO2 92%   BMI 22.00 kg/m  , BMI Body mass index is 22 kg/m. GEN: Well nourished, well developed, male, in no acute distress HEENT: normal Neck: no JVD, carotid bruits, or masses Cardiac: RRR; soft murmur, no rubs or gallops, no edema  Respiratory: decreased BS bases bilaterally, normal work of breathing GI: soft, nontender, nondistended, + BS MS: no deformity or atrophy Skin: warm and dry, no rash Neuro:  Strength and  sensation are  intact Psych: euthymic mood, full affect   EKG:  ECG is ordered today. The ECG ordered today demonstrates sinus rhythm, first-degree AV block with a PR interval of 242 ms, no new ischemic changes, otherwise normal intervals.  No significant change from January 2018   Recent Labs: 02/20/2018: ALT 16; BUN 10; Creatinine, Ser 1.05; Potassium 4.3; Sodium 139  BMET    Component Value Date/Time   NA 139 02/20/2018 1450   NA 143 10/20/2015 1334   K 4.3 02/20/2018 1450   K 3.7 10/05/2012   CL 108 02/20/2018 1450   CO2 25 02/20/2018 1450   GLUCOSE 110 (H) 02/20/2018 1450   BUN 10 02/20/2018 1450   BUN 12 10/20/2015 1334   CREATININE 1.05 02/20/2018 1450   CREATININE 1.28 (H) 09/10/2015 0926   CALCIUM 8.4 (L) 02/20/2018 1450   CALCIUM 8.7 10/05/2012   GFRNONAA >60 02/20/2018 1450   GFRAA >60 02/20/2018 1450   Lab Results  Component Value Date   TSH 1.382 05/07/2015   CBC Lab Results  Component Value Date   WBC 8.0 09/10/2015   HGB 12.9 (L) 09/10/2015   HCT 38.0 (L) 09/10/2015   MCV 96.9 09/10/2015   PLT 212 09/10/2015     Lipid Panel    Component Value Date/Time   CHOL 74 02/20/2018 1450   TRIG 89 02/20/2018 1450   HDL 33 (L) 02/20/2018 1450   CHOLHDL 2.2 02/20/2018 1450   VLDL 18 02/20/2018 1450   LDLCALC 23 02/20/2018 1450      Wt Readings from Last 3 Encounters:  02/20/18 176 lb (79.8 kg)  06/08/16 195 lb (88.5 kg)  04/20/16 194 lb 9.6 oz (88.3 kg)     Studies:  - LHC (None):    - Echo (2016):   - Left ventricle: LVEF is approximately 20% with akinesis of the   mid/distal septum, distal lateral, mid/distal anteiror, distal   inferior and apical walls. The cavity size was normal. Wall   thickness was increased in a pattern of mild LVH. - Aortic valve: There was trivial regurgitation. - Right atrium: The atrium was mildly dilated. - Pulmonary arteries: PA peak pressure: 35 mm Hg (S).  - Nuclear (none):    - Carotid US (02/08/2010):   Findings:   RIGHT CAROTID ARTERY: Intimal thickening and minimal noncalcified plaque right CCA right carotid bulb.  No significant plaque formation within right ECA right CCA.  Laminar flow on color Doppler imaging in right ICA.  Mild spectral broadening right ICA on waveform analysis.  Patient intermittently arrhythmic.  Slight notching of the right ICA systolic peak, pulsus bisferiens.  No high velocity jets.   RIGHT VERTEBRAL ARTERY:  Patent, antegrade   LEFT CAROTID ARTERY: Intimal thickening left CCA.  No significant plaque formation.  Patient intermittently arrhythmic.  Laminar flow by color Doppler imaging.  Mild spectral broadening left ICA on waveform analysis.  Notch within systolic peak of left ICA waveform, pulsus bisferiens.  No high velocity jets.   LEFT VERTEBRAL ARTERY:  Patent, antegrade   IMPRESSION:   No evidence of significant plaque formation or stenosis, with only minimal plaque noted in the right carotid system. Notch identified within the systolic peaks of the internal carotid artery waveforms bilaterally, pulsus bisferiens, could potentially reflect aortic valvular disease or hypertrophic obstructive cardiomyopathy. Intermittent arrhythmia.  Other studies Reviewed Additional studies/ records that were reviewed today include: office notes, hospital records and testing.  ASSESSMENT AND PLAN:   ICD-10-CM   1. Coronary artery disease  involving native coronary artery of native heart without angina pectoris I25.10 Comprehensive Metabolic Panel (CMET)    Lipid Profile  2. Chronic systolic CHF (congestive heart failure) (HCC) I50.22 EKG 12-Lead    Comprehensive Metabolic Panel (CMET)    Lipid Profile  3. Low serum high density lipoprotein (HDL) R74.8   4. Weight loss, non-intentional R63.4    1.  CAD: He is not on aspirin, the reason for this is unclear.  This was not noted until after he left the office, we will call him and see if he will agree to take a  baby aspirin daily.  Continue beta-blocker, ACE inhibitor, nitrates, and statin.  His blood pressure is well controlled and he is already having problems at times with dizziness when he stands up.  No med changes for now. - His activity level does not seem limited by his cardiac status.  No testing at this time.  His caregiver understands that she should report symptoms.  2.  Chronic systolic CHF: His weight is down almost 20 pounds since January 2018.  This is likely due to poor p.o. intake.  He has no volume overload by exam.  He is not currently on a diuretic and I will not start one.  Continue medical management with dietary sodium restrictions.  3.  Low serum HDL: His LDL has been consistently low on his current Lipitor dose, continue this.  His HDL is also generally low.  4.  Weight loss, nonintentional: This is most likely related to poor p.o. intake.  His caregiver admits that he does not eat that well and rarely eats lunch.  He is encouraged to eat 3 meals a day, and sugar or some other meal replacement is acceptable if he does not feel like eating.    Current medicines are reviewed at length with the patient today.  The patient does not have concerns regarding medicines.  The following changes have been made:  no change  Labs/ tests ordered today include:   Orders Placed This Encounter  Procedures  . Comprehensive Metabolic Panel (CMET)  . Lipid Profile  . EKG 12-Lead     Disposition:   FU with Dorris Carnes, MD in a year  Signed, Rosaria Ferries, PA-C  02/20/2018 4:39 PM    [facility] [address], [place], [place]  [address] Phone: [phone]; Fax: [phone]

## 2018-02-20 NOTE — Patient Instructions (Signed)
Medication Instructions:  Your physician recommends that you continue on your current medications as directed. Please refer to the Current Medication list given to you today.  If you need a refill on your cardiac medications before your next appointment, please call your pharmacy.   Lab work: Your physician recommends that you return for lab work in: Today    If you have labs (blood work) drawn today and your tests are completely normal, you will receive your results only by: . MyChart Message (if you have MyChart) OR . A paper copy in the mail If you have any lab test that is abnormal or we need to change your treatment, we will call you to review the results.  Testing/Procedures: NONE   Follow-Up: At [facility], you and your health needs are our priority.  As part of our continuing mission to provide you with exceptional heart care, we have created designated Provider Care Teams.  These Care Teams include your primary Cardiologist (physician) and Advanced Practice Providers (APPs -  Physician Assistants and Nurse Practitioners) who all work together to provide you with the care you need, when you need it. You will need a follow up appointment in 1 years.  Please call our office 2 months in advance to schedule this appointment.  You may see Paula Ross, MD or one of the following Advanced Practice Providers on your designated Care Team:   Brittany Strader, PA-C ([place] Office) . Michele Lenze, PA-C ([place] Office)  Any Other Special Instructions Will Be Listed Below (If Applicable). Thank you for choosing [facility]!     

## 2018-02-22 ENCOUNTER — Telehealth: Payer: Self-pay | Admitting: *Deleted

## 2018-02-22 NOTE — Telephone Encounter (Signed)
Called patient with test results. No answer. Left message to call back.  

## 2018-02-22 NOTE — Telephone Encounter (Signed)
-----   Message from Lonn Georgia, PA-C sent at 02/21/2018  1:09 PM EDT ----- Please let him know that his bad cholesterol is low, which is good. However, his good cholesterol is also low.  Activity is the best way to bring that up. His other labs were pretty good, calcium was a little low, can he increase that.  Alkaline phosphatase was elevated, but lower than it has been in the last couple of years. Thanks

## 2018-12-17 ENCOUNTER — Other Ambulatory Visit: Payer: Self-pay

## 2018-12-17 ENCOUNTER — Emergency Department (HOSPITAL_COMMUNITY): Payer: Medicare Other

## 2018-12-17 ENCOUNTER — Encounter (HOSPITAL_COMMUNITY): Payer: Self-pay | Admitting: Emergency Medicine

## 2018-12-17 ENCOUNTER — Inpatient Hospital Stay (HOSPITAL_COMMUNITY)
Admission: EM | Admit: 2018-12-17 | Discharge: 2018-12-21 | DRG: 312 | Disposition: A | Discharge status: Home Health | Payer: Medicare Other | Attending: Family Medicine | Admitting: Family Medicine

## 2018-12-17 DIAGNOSIS — F0390 Unspecified dementia without behavioral disturbance: Secondary | ICD-10-CM | POA: Diagnosis present

## 2018-12-17 DIAGNOSIS — F329 Major depressive disorder, single episode, unspecified: Secondary | ICD-10-CM | POA: Diagnosis present

## 2018-12-17 DIAGNOSIS — I255 Ischemic cardiomyopathy: Secondary | ICD-10-CM | POA: Diagnosis present

## 2018-12-17 DIAGNOSIS — Z8711 Personal history of peptic ulcer disease: Secondary | ICD-10-CM

## 2018-12-17 DIAGNOSIS — I252 Old myocardial infarction: Secondary | ICD-10-CM

## 2018-12-17 DIAGNOSIS — F419 Anxiety disorder, unspecified: Secondary | ICD-10-CM | POA: Diagnosis present

## 2018-12-17 DIAGNOSIS — K2211 Ulcer of esophagus with bleeding: Secondary | ICD-10-CM | POA: Diagnosis present

## 2018-12-17 DIAGNOSIS — Z7982 Long term (current) use of aspirin: Secondary | ICD-10-CM

## 2018-12-17 DIAGNOSIS — T796XXA Traumatic ischemia of muscle, initial encounter: Secondary | ICD-10-CM | POA: Diagnosis present

## 2018-12-17 DIAGNOSIS — R55 Syncope and collapse: Principal | ICD-10-CM | POA: Diagnosis present

## 2018-12-17 DIAGNOSIS — F32A Depression, unspecified: Secondary | ICD-10-CM | POA: Diagnosis present

## 2018-12-17 DIAGNOSIS — F332 Major depressive disorder, recurrent severe without psychotic features: Secondary | ICD-10-CM | POA: Diagnosis present

## 2018-12-17 DIAGNOSIS — Z23 Encounter for immunization: Secondary | ICD-10-CM

## 2018-12-17 DIAGNOSIS — I4891 Unspecified atrial fibrillation: Secondary | ICD-10-CM

## 2018-12-17 DIAGNOSIS — E876 Hypokalemia: Secondary | ICD-10-CM | POA: Diagnosis present

## 2018-12-17 DIAGNOSIS — F17200 Nicotine dependence, unspecified, uncomplicated: Secondary | ICD-10-CM | POA: Diagnosis present

## 2018-12-17 DIAGNOSIS — Z915 Personal history of self-harm: Secondary | ICD-10-CM

## 2018-12-17 DIAGNOSIS — I480 Paroxysmal atrial fibrillation: Secondary | ICD-10-CM | POA: Diagnosis present

## 2018-12-17 DIAGNOSIS — I2510 Atherosclerotic heart disease of native coronary artery without angina pectoris: Secondary | ICD-10-CM | POA: Diagnosis present

## 2018-12-17 DIAGNOSIS — Z79899 Other long term (current) drug therapy: Secondary | ICD-10-CM

## 2018-12-17 DIAGNOSIS — N179 Acute kidney failure, unspecified: Secondary | ICD-10-CM | POA: Diagnosis present

## 2018-12-17 DIAGNOSIS — Y92009 Unspecified place in unspecified non-institutional (private) residence as the place of occurrence of the external cause: Secondary | ICD-10-CM

## 2018-12-17 DIAGNOSIS — D72829 Elevated white blood cell count, unspecified: Secondary | ICD-10-CM | POA: Diagnosis present

## 2018-12-17 DIAGNOSIS — E860 Dehydration: Secondary | ICD-10-CM | POA: Diagnosis present

## 2018-12-17 DIAGNOSIS — K219 Gastro-esophageal reflux disease without esophagitis: Secondary | ICD-10-CM | POA: Diagnosis present

## 2018-12-17 DIAGNOSIS — K222 Esophageal obstruction: Secondary | ICD-10-CM | POA: Diagnosis present

## 2018-12-17 DIAGNOSIS — W19XXXA Unspecified fall, initial encounter: Secondary | ICD-10-CM | POA: Diagnosis not present

## 2018-12-17 DIAGNOSIS — Z818 Family history of other mental and behavioral disorders: Secondary | ICD-10-CM

## 2018-12-17 DIAGNOSIS — E872 Acidosis: Secondary | ICD-10-CM | POA: Diagnosis not present

## 2018-12-17 DIAGNOSIS — R296 Repeated falls: Secondary | ICD-10-CM | POA: Diagnosis present

## 2018-12-17 DIAGNOSIS — Z20828 Contact with and (suspected) exposure to other viral communicable diseases: Secondary | ICD-10-CM | POA: Diagnosis present

## 2018-12-17 DIAGNOSIS — I5022 Chronic systolic (congestive) heart failure: Secondary | ICD-10-CM | POA: Diagnosis present

## 2018-12-17 LAB — URINALYSIS, ROUTINE W REFLEX MICROSCOPIC
Bacteria, UA: NONE SEEN
Bilirubin Urine: NEGATIVE
Glucose, UA: NEGATIVE mg/dL
Ketones, ur: NEGATIVE mg/dL
Leukocytes,Ua: NEGATIVE
Nitrite: NEGATIVE
Protein, ur: 100 mg/dL — AB
Specific Gravity, Urine: 1.027 (ref 1.005–1.030)
pH: 5 (ref 5.0–8.0)

## 2018-12-17 LAB — CBC WITH DIFFERENTIAL/PLATELET
Abs Immature Granulocytes: 0.16 10*3/uL — ABNORMAL HIGH (ref 0.00–0.07)
Basophils Absolute: 0.1 10*3/uL (ref 0.0–0.1)
Basophils Relative: 1 %
Eosinophils Absolute: 0 10*3/uL (ref 0.0–0.5)
Eosinophils Relative: 0 %
HCT: 40.5 % (ref 39.0–52.0)
Hemoglobin: 13.4 g/dL (ref 13.0–17.0)
Immature Granulocytes: 1 %
Lymphocytes Relative: 9 %
Lymphs Abs: 1.2 10*3/uL (ref 0.7–4.0)
MCH: 32.2 pg (ref 26.0–34.0)
MCHC: 33.1 g/dL (ref 30.0–36.0)
MCV: 97.4 fL (ref 80.0–100.0)
Monocytes Absolute: 1.3 10*3/uL — ABNORMAL HIGH (ref 0.1–1.0)
Monocytes Relative: 10 %
Neutro Abs: 10.4 10*3/uL — ABNORMAL HIGH (ref 1.7–7.7)
Neutrophils Relative %: 79 %
Platelets: 223 10*3/uL (ref 150–400)
RBC: 4.16 MIL/uL — ABNORMAL LOW (ref 4.22–5.81)
RDW: 13.3 % (ref 11.5–15.5)
WBC: 13.2 10*3/uL — ABNORMAL HIGH (ref 4.0–10.5)
nRBC: 0 % (ref 0.0–0.2)

## 2018-12-17 LAB — COMPREHENSIVE METABOLIC PANEL
ALT: 18 U/L (ref 0–44)
AST: 31 U/L (ref 15–41)
Albumin: 4.1 g/dL (ref 3.5–5.0)
Alkaline Phosphatase: 141 U/L — ABNORMAL HIGH (ref 38–126)
Anion gap: 10 (ref 5–15)
BUN: 13 mg/dL (ref 8–23)
CO2: 23 mmol/L (ref 22–32)
Calcium: 9.1 mg/dL (ref 8.9–10.3)
Chloride: 112 mmol/L — ABNORMAL HIGH (ref 98–111)
Creatinine, Ser: 1.33 mg/dL — ABNORMAL HIGH (ref 0.61–1.24)
GFR calc Af Amer: 56 mL/min — ABNORMAL LOW (ref >60)
GFR calc non Af Amer: 49 mL/min — ABNORMAL LOW (ref >60)
Glucose, Bld: 119 mg/dL — ABNORMAL HIGH (ref 70–99)
Potassium: 3.9 mmol/L (ref 3.5–5.1)
Sodium: 145 mmol/L (ref 135–145)
Total Bilirubin: 0.6 mg/dL (ref 0.3–1.2)
Total Protein: 7.4 g/dL (ref 6.5–8.1)

## 2018-12-17 LAB — TROPONIN I (HIGH SENSITIVITY)
Troponin I (High Sensitivity): 53 ng/L — ABNORMAL HIGH (ref <18)
Troponin I (High Sensitivity): 52 ng/L — ABNORMAL HIGH (ref <18)

## 2018-12-17 LAB — SAMPLE TO BLOOD BANK

## 2018-12-17 LAB — SARS CORONAVIRUS 2 BY RT PCR (HOSPITAL ORDER, PERFORMED IN ~~LOC~~ HOSPITAL LAB): SARS Coronavirus 2: NEGATIVE

## 2018-12-17 LAB — POC OCCULT BLOOD, ED: Fecal Occult Bld: NEGATIVE

## 2018-12-17 LAB — AMMONIA: Ammonia: 15 umol/L (ref 9–35)

## 2018-12-17 LAB — CK: Total CK: 1892 U/L — ABNORMAL HIGH (ref 49–397)

## 2018-12-17 LAB — LIPASE, BLOOD: Lipase: 27 U/L (ref 11–51)

## 2018-12-17 MED ORDER — SODIUM CHLORIDE 0.9 % IV SOLN
1000.0000 mL | INTRAVENOUS | Status: DC
  Administered 2018-12-17: 1000 mL via INTRAVENOUS

## 2018-12-17 MED ORDER — TETANUS-DIPHTH-ACELL PERTUSSIS 5-2.5-18.5 LF-MCG/0.5 IM SUSP
0.5000 mL | Freq: Once | INTRAMUSCULAR | Status: AC
  Administered 2018-12-17: 0.5 mL via INTRAMUSCULAR
  Filled 2018-12-17: qty 0.5

## 2018-12-17 MED ORDER — ACETAMINOPHEN 650 MG RE SUPP: 650.0000 mg | Freq: Four times a day (QID) | RECTAL | Status: DC | PRN

## 2018-12-17 MED ORDER — OXYCODONE HCL 5 MG PO TABS: 5.0000 mg | ORAL_TABLET | ORAL | Status: DC | PRN

## 2018-12-17 MED ORDER — HYDRALAZINE HCL 20 MG/ML IJ SOLN: 10.0000 mg | Freq: Four times a day (QID) | INTRAMUSCULAR | Status: DC | PRN

## 2018-12-17 MED ORDER — SODIUM CHLORIDE 0.9 % IV BOLUS (SEPSIS)
500.0000 mL | Freq: Once | INTRAVENOUS | Status: AC
  Administered 2018-12-17: 500 mL via INTRAVENOUS

## 2018-12-17 MED ORDER — QUETIAPINE FUMARATE 100 MG PO TABS
200.0000 mg | ORAL_TABLET | Freq: Every day | ORAL | Status: DC
  Administered 2018-12-17 – 2018-12-20 (×4): 200 mg via ORAL
  Filled 2018-12-17 (×4): qty 2

## 2018-12-17 MED ORDER — ACETAMINOPHEN 325 MG PO TABS: 650.0000 mg | ORAL_TABLET | Freq: Four times a day (QID) | ORAL | Status: DC | PRN

## 2018-12-17 MED ORDER — SODIUM CHLORIDE 0.9% FLUSH
3.0000 mL | Freq: Two times a day (BID) | INTRAVENOUS | Status: DC
  Administered 2018-12-18 – 2018-12-20 (×3): 3 mL via INTRAVENOUS

## 2018-12-17 MED ORDER — MEGESTROL ACETATE 400 MG/10ML PO SUSP
400.0000 mg | Freq: Every day | ORAL | Status: DC
  Administered 2018-12-18 – 2018-12-21 (×4): 400 mg via ORAL
  Filled 2018-12-17 (×4): qty 10

## 2018-12-17 MED ORDER — HEPARIN SODIUM (PORCINE) 5000 UNIT/ML IJ SOLN
5000.0000 [IU] | Freq: Three times a day (TID) | INTRAMUSCULAR | Status: DC
  Administered 2018-12-17 – 2018-12-21 (×11): 5000 [IU] via SUBCUTANEOUS
  Filled 2018-12-17 (×11): qty 1

## 2018-12-17 MED ORDER — VITAMIN D (ERGOCALCIFEROL) 1.25 MG (50000 UNIT) PO CAPS
50000.0000 [IU] | ORAL_CAPSULE | ORAL | Status: DC
  Administered 2018-12-21: 50000 [IU] via ORAL
  Filled 2018-12-17: qty 1

## 2018-12-17 MED ORDER — ASPIRIN EC 81 MG PO TBEC
81.0000 mg | DELAYED_RELEASE_TABLET | Freq: Every day | ORAL | Status: DC
  Administered 2018-12-18 – 2018-12-21 (×4): 81 mg via ORAL
  Filled 2018-12-17 (×4): qty 1

## 2018-12-17 MED ORDER — VENLAFAXINE HCL ER 75 MG PO CP24
75.0000 mg | ORAL_CAPSULE | Freq: Every day | ORAL | Status: DC
  Administered 2018-12-17 – 2018-12-20 (×4): 75 mg via ORAL
  Filled 2018-12-17 (×4): qty 1

## 2018-12-17 MED ORDER — CARVEDILOL 3.125 MG PO TABS: 3.1250 mg | ORAL_TABLET | Freq: Two times a day (BID) | ORAL | Status: DC

## 2018-12-17 MED ORDER — POLYETHYLENE GLYCOL 3350 17 G PO PACK: 17.0000 g | PACK | Freq: Every day | ORAL | Status: DC | PRN

## 2018-12-17 MED ORDER — SODIUM CHLORIDE 0.9% FLUSH: 3.0000 mL | INTRAVENOUS | Status: DC | PRN

## 2018-12-17 MED ORDER — CARVEDILOL 3.125 MG PO TABS
6.2500 mg | ORAL_TABLET | Freq: Two times a day (BID) | ORAL | Status: DC
  Administered 2018-12-17 – 2018-12-21 (×8): 6.25 mg via ORAL
  Filled 2018-12-17 (×9): qty 2

## 2018-12-17 MED ORDER — ONDANSETRON HCL 4 MG/2ML IJ SOLN: 4.0000 mg | Freq: Four times a day (QID) | INTRAMUSCULAR | Status: DC | PRN

## 2018-12-17 MED ORDER — ONDANSETRON HCL 4 MG PO TABS: 4.0000 mg | ORAL_TABLET | Freq: Four times a day (QID) | ORAL | Status: DC | PRN

## 2018-12-17 MED ORDER — SODIUM CHLORIDE 0.9 % IV SOLN
INTRAVENOUS | Status: DC
  Administered 2018-12-17 – 2018-12-20 (×4): via INTRAVENOUS

## 2018-12-17 MED ORDER — BACITRACIN-NEOMYCIN-POLYMYXIN 400-5-5000 EX OINT
TOPICAL_OINTMENT | Freq: Once | CUTANEOUS | Status: AC
  Administered 2018-12-17: 1 via TOPICAL
  Filled 2018-12-17: qty 1

## 2018-12-17 MED ORDER — PANTOPRAZOLE SODIUM 40 MG PO TBEC
40.0000 mg | DELAYED_RELEASE_TABLET | Freq: Two times a day (BID) | ORAL | Status: DC
  Administered 2018-12-17 – 2018-12-21 (×8): 40 mg via ORAL
  Filled 2018-12-17 (×8): qty 1

## 2018-12-17 MED ORDER — FERROUS SULFATE 325 (65 FE) MG PO TABS
325.0000 mg | ORAL_TABLET | Freq: Two times a day (BID) | ORAL | Status: DC
  Administered 2018-12-17 – 2018-12-21 (×8): 325 mg via ORAL
  Filled 2018-12-17 (×8): qty 1

## 2018-12-17 MED ORDER — ALBUTEROL SULFATE (2.5 MG/3ML) 0.083% IN NEBU: 2.5000 mg | INHALATION_SOLUTION | RESPIRATORY_TRACT | Status: DC | PRN

## 2018-12-17 MED ORDER — SODIUM CHLORIDE 0.9 % IV SOLN: 250.0000 mL | INTRAVENOUS | Status: DC | PRN

## 2018-12-17 NOTE — ED Provider Notes (Signed)
[facility] EMERGENCY DEPARTMENT Provider Note   CSN: [id number] Arrival date & time: 12/17/18  8366     History   Chief Complaint Chief Complaint  Patient presents with   Hematemesis   Fall    HPI Edward Trevino is a 85 y.o. male.     Patient is an 85 year old male who presents to the emergency department by EMS following a fall.  This is a level 5 caveat due to patient's level of confusion.  The information obtained from EMS is that the patient was found by a visiting caregiver.  The patient was found on the floor, and was found in a different room and where it appeared that the fall occurred.  The caregiver then called EMS for assessment and assistance.  The patient states he does not remember the fall, and he is not sure how long he was on the floor.  Review of the patient's records is negative for anticoagulation medications.  The patient is disheveled.  He does state that he lives alone.  The history is provided by the patient.  Fall    Past Medical History:  Diagnosis Date   Anxiety    Bladder stones 05/22/2015   Choledocholithiasis with obstruction 07/25/2012   Depression    Duodenitis 05/11/15   Erosive esophagitis    Esophageal stricture 05/11/15   Dilated   Gastric out let obstruction    Gastric outlet obstruction 05/07/2015   HOH (hard of hearing)    Insomnia    Ischemic cardiomyopathy    LVEF 20%   Multiple duodenal ulcers 05/11/15   2  were present per EGD.   ST elevation myocardial infarction (STEMI) of anterior wall [facility])    October 2016 - late presentation, managed conservatively   Suicide attempt [facility])     Patient Active Problem List   Diagnosis Date Noted   Constipation 06/11/2015   Rectal bleeding 06/11/2015   Bladder stones 05/22/2015   Acute duodenal ulcer with gastric outlet obstruction    Normocytic anemia 05/21/2015   Duodenal stricture    Abnormal serum level of alkaline phosphatase    Nausea and vomiting  05/20/2015   Esophageal stricture 05/20/2015   Dysphagia 05/20/2015   Depression 05/20/2015   Duodenal ulcer 05/20/2015   Acute esophagitis    Gastric outflow obstruction    Peptic ulcer    Gastric outlet obstruction 05/07/2015   Hematemesis with nausea    Loss of weight    History of STEMI-Oct 2016- conservative Rx 05/06/2015   Dyslipidemia 05/06/2015   Atrial fibrillation, new onset ([place]) 05/06/2015   Orthostatic hypotension 05/05/2015   Cardiomyopathy, ischemic-EF 20% 29/47/6546   Chronic systolic CHF (congestive heart failure) ([place]) 05/05/2015   Chest pain with moderate risk of acute coronary syndrome 05/03/2015   Acute coronary syndrome ([place]) 02/28/2015   MDD (major depressive disorder), recurrent severe, without psychosis ([place]) 12/19/2012   Insomnia secondary to depression with anxiety 12/19/2012   Anxiety    Abnormal LFTs 10/01/2012   Gallstones 10/01/2012   Anemia 10/01/2012   Erosive esophagitis 07/25/2012   Gastric out let obstruction 07/25/2012   Choledocholithiasis with obstruction 07/25/2012    Class: History of   Obstructive jaundice 07/23/2012    Past Surgical History:  Procedure Laterality Date   APPENDECTOMY     BACK SURGERY     BALLOON DILATION N/A 07/24/2012   Procedure: BALLOON DILATION;  Surgeon: Daneil Dolin, MD;  Location: [facility] ORS;  Service: Endoscopy;  Laterality: N/A;  Balloon Stone  Extraction, Drudging   BIOPSY N/A 07/24/2012   Procedure: BIOPSY;  Surgeon: Daneil Dolin, MD;  Location: [facility] ORS;  Service: Endoscopy;  Laterality: N/A;  Duodenal and Esophageal Biopsies   BIOPSY N/A 10/18/2012   Procedure: BIOPSY;  Surgeon: Daneil Dolin, MD;  Location: [facility] ORS;  Service: Endoscopy;  Laterality: N/A;   CHOLECYSTECTOMY N/A 10/26/2012   Procedure: LAPAROSCOPIC CHOLECYSTECTOMY;  Surgeon: Jamesetta So, MD;  Location: [facility] ORS;  Service: General;  Laterality: N/A;   EGD/ERCP  10/18/2012   Dr. Gala Romney: ;yloric channel stenosis,  s/p dilation and bx, persisting CBD stones with extension of sphincterotomy and sphincterotomy balloon dilation and stone extraction. Removal of biliary stent   ERCP N/A 07/24/2012   Dr. Gala Romney. Significant abnormalities of the bowl and proximal second portion producing partial gastric outlet stricture and with secondary gastric dilation and severe erosive reflux esophagitis. Biopsy showed benign ulceration. Normal-appearing ampulla, status post biliary sphincterotomy and ampulla balloon dilation, stone extraction and stent placement.   ERCP N/A 10/18/2012   Procedure: ENDOSCOPIC RETROGRADE CHOLANGIOPANCREATOGRAPHY (ERCP);  Surgeon: Daneil Dolin, MD;  Location: [facility] ORS;  Service: Endoscopy;  Laterality: N/A;   ESOPHAGEAL DILATION N/A 05/15/2015   Procedure: ESOPHAGEAL DILATION;  Surgeon: Danie Binder, MD;  Location: [facility] END[facility] SUITE;  Service: Endoscopy;  Laterality: N/A;   ESOPHAGOGASTRODUODENOSCOPY N/A 07/24/2012   YFR:TMYTRZ-NBVAPOLID ampulla s/p biliary sphincterotomy and ampullary   ESOPHAGOGASTRODUODENOSCOPY N/A 05/07/2015   RMR: Severe esophagitis, retained food in the stomach precluded complete exam, pyloric channel/duodenal bulb abnormal with 1 cm ulcer, friability with high-grade gastric outlet obstruction. No dilation performed on Plavix and aspirin.   ESOPHAGOGASTRODUODENOSCOPY N/A 05/11/2015   Rehman: Distal esophageal ulcers with circumferential ulceration at distal 3-4 cm, soft stricture dilated with scope, diffuse erythema and edema in the bulbar mucosa with 2 small ulcers in the distal segment and high-grade stricture in the middle, scope could not be passed. Dilated up to 12 mm. Could not see duodenum beyond this point because the scope was retained in the large stomach.   ESOPHAGOGASTRODUODENOSCOPY N/A 05/15/2015   SLF: Esophagitis with stricture, dilated GE junction stricture up to 14 mm with the balloon, D1/D2 stricture with narrowing of the lumen to 10-11 mm, dilated up to 15 mm  with balloon. Downstream duodenum appear normal. Small duodenal bulbar ulcer. Biopsies from the stomach benign gastritis, no H pylori.   ESOPHAGOGASTRODUODENOSCOPY (EGD) WITH PROPOFOL N/A 10/18/2012   CVU:DTHYHOO channel stenosis s/p dilation/Persisting common bile duct stones with extension of sphincterotomy     and sphincterotomy balloon dilation and stone extraction.  Biliary stent removal    LAPAROSCOPY N/A 10/31/2012   Procedure: LAPAROSCOPY DIAGNOSTIC;  Surgeon: Donato Heinz, MD;  Location: [facility] ORS;  Service: General;  Laterality: N/A;   REMOVAL OF STONES N/A 07/24/2012   Procedure: REMOVAL OF STONES;  Surgeon: Daneil Dolin, MD;  Location: [facility];  Service: Endoscopy;  Laterality: N/A;   REMOVAL OF STONES N/A 10/18/2012   Procedure: REMOVAL OF STONES;  Surgeon: Daneil Dolin, MD;  Location: [facility];  Service: Endoscopy;  Laterality: N/A;   SPHINCTEROTOMY N/A 07/24/2012   Procedure: SPHINCTEROTOMY;  Surgeon: Daneil Dolin, MD;  Location: [facility] OR[facility];  Service: Endoscopy;  Laterality: N/A;        Home Medications    Prior to Admission medications   Medication Sig Start Date End Date Taking? Authorizing Provider  aspirin EC 81 MG tablet Take 81 mg by mouth daily.    [provider]  atorvastatin (LIPITOR) 80 MG tablet TAKE 1 TABLET BY MOUTH ONCE DAILY AT  6  PM 07/25/17   Fay Records, MD  carvedilol (COREG) 6.25 MG tablet TAKE ONE TABLET BY MOUTH TWICE DAILY WITH A MEAL --  **NEEDS  OFFICE  VISIT** Patient taking differently: take 1/2 tablet (3.125 mg) twice daily 03/24/16   Fay Records, MD  lisinopril (PRINIVIL,ZESTRIL) 2.5 MG tablet Take 1 tablet (2.5 mg total) by mouth daily. 09/14/16   Fay Records, MD  nitroGLYCERIN (NITRODUR - DOSED IN MG/24 HR) 0.4 mg/hr patch APPLY ONE PATCH TOPICALLY ONCE DAILY 06/16/17   Fay Records, MD  pantoprazole (PROTONIX) 40 MG tablet Take 1 tablet (40 mg total) by mouth 2 (two) times daily before a meal. 08/25/15   Fay Records, MD  QUEtiapine  (SEROQUEL) 100 MG tablet Take 100 mg by mouth at bedtime. 01/28/15   [provider]  Venlafaxine HCl 75 MG TB24 Take 75 mg by mouth at bedtime. 01/28/15   [provider]    Family History Family History  Problem Relation Age of Onset   Depression Sister    Colon cancer Neg Hx    Heart disease Neg Hx     Social History Social History   Tobacco Use   Smoking status: Current Every Day Smoker    Types: Cigars   Smokeless tobacco: Never Used   Tobacco comment: Smokes 4-5 cigars a day "but I don't really inhale."  Substance Use Topics   Alcohol use: No    Alcohol/week: 0.0 standard drinks   Drug use: No     Allergies   Patient has no known allergies.   Review of Systems Review of Systems  Unable to perform ROS: Dementia     Physical Exam Updated Vital Signs BP (!) 128/108    Pulse 85    Temp 97.6 F (36.4 C) (Oral)    Resp 16    Ht 6\' 2"  (1.88 m)    Wt 80 kg    SpO2 100%    BMI 22.64 kg/m   Physical Exam Vitals signs and nursing note reviewed.  Constitutional:      Appearance: He is well-developed. He is not toxic-appearing.  HENT:     Head: Normocephalic.     Comments: No bruising or hematoma noted about the scalp or the forehead area.  No facial tenderness to palpation.  No noted deformity.    Right Ear: Tympanic membrane and external ear normal.     Left Ear: Tympanic membrane and external ear normal.  Eyes:     General: Lids are normal.     Pupils: Pupils are equal, round, and reactive to light.  Neck:     Musculoskeletal: Normal range of motion and neck supple.     Vascular: No carotid bruit.  Cardiovascular:     Rate and Rhythm: Normal rate and regular rhythm.     Pulses: Normal pulses.     Heart sounds: Normal heart sounds.  Pulmonary:     Effort: No respiratory distress.     Breath sounds: Normal breath sounds.     Comments: There is symmetrical rise and fall of the chest.  The speaking that the patient does attempt is full  sentences without problem. There is no bruising to the chest or rib area.  No palpable rib deformity or rib tenderness. Abdominal:     General: Bowel sounds are normal.     Palpations: Abdomen is soft.  Tenderness: There is no abdominal tenderness. There is no guarding.  Musculoskeletal: Normal range of motion.     Comments: Patient has full range of motion of the upper extremities.  There are abrasions on both elbows.  There is dried skin and what appears to be some fecal material on both arms and hands.  There is material under the fingernails.  There is decreased range of motion of the lower extremities due to pain.  There is no palpable or visible deformity appreciated.  There is no shortening of the right or left extremity.  The dorsalis pedis pulses are 2+.  There is material under the toenails and in between the toes, and there is question of some fecal material on the lower legs.  There is no palpable step-off of the cervical, thoracic, or lumbar spine.  There is no tenderness to palpation in these areas.  There are no skin lesions appreciated on the back.  Lymphadenopathy:     Head:     Right side of head: No submandibular adenopathy.     Left side of head: No submandibular adenopathy.     Cervical: No cervical adenopathy.  Skin:    General: Skin is warm and dry.  Neurological:     Mental Status: He is alert and oriented to person, place, and time.     Cranial Nerves: No cranial nerve deficit.     Sensory: No sensory deficit.  Psychiatric:        Speech: Speech normal.      ED Treatments / Results  Labs (all labs ordered are listed, but only abnormal results are displayed) Labs Reviewed  COMPREHENSIVE METABOLIC PANEL - Abnormal; Notable for the following components:      Result Value   Chloride 112 (*)    Glucose, Bld 119 (*)    Creatinine, Ser 1.33 (*)    Alkaline Phosphatase 141 (*)    GFR calc non Af Amer 49 (*)    GFR calc Af Amer 56 (*)    All other  components within normal limits  CBC WITH DIFFERENTIAL/PLATELET - Abnormal; Notable for the following components:   WBC 13.2 (*)    RBC 4.16 (*)    Neutro Abs 10.4 (*)    Monocytes Absolute 1.3 (*)    Abs Immature Granulocytes 0.16 (*)    All other components within normal limits  CK - Abnormal; Notable for the following components:   Total CK 1,892 (*)    All other components within normal limits  TROPONIN I (HIGH SENSITIVITY) - Abnormal; Notable for the following components:   Troponin I (High Sensitivity) 53 (*)    All other components within normal limits  LIPASE, BLOOD  AMMONIA  URINALYSIS, ROUTINE W REFLEX MICROSCOPIC  POC OCCULT BLOOD, ED  SAMPLE TO BLOOD BANK  TROPONIN I (HIGH SENSITIVITY)    EKG None  Radiology Ct Head Wo Contrast  Result Date: 12/17/2018 CLINICAL DATA:  [place] home.  Found down. EXAM: CT HEAD WITHOUT CONTRAST CT CERVICAL SPINE WITHOUT CONTRAST TECHNIQUE: Multidetector CT imaging of the head and cervical spine was performed following the standard protocol without intravenous contrast. Multiplanar CT image reconstructions of the cervical spine were also generated. COMPARISON:  Head CT 07/22/2012 FINDINGS: CT HEAD FINDINGS Brain: Age related cerebral atrophy, ventriculomegaly and periventricular white matter disease. No acute intracranial findings such as hemispheric infarction or intracranial hemorrhage. No extra-axial fluid collections are identified. The brainstem and cerebellum appear normal. Vascular: Vascular calcifications but no definite aneurysm  or hyperdense vessels. Skull: No skull fracture or bone lesions. Sinuses/Orbits: The paranasal sinuses and mastoid air cells are clear. Other: No scalp hematoma or laceration. CT CERVICAL SPINE FINDINGS Alignment: Normal overall alignment. Skull base and vertebrae: Moderate degenerative changes involving the skull base C1 and C1-2. Moderate degenerative cervical spondylosis with multilevel disc disease and facet  disease. No acute cervical spine fracture is identified. Soft tissues and spinal canal: No prevertebral fluid or swelling. No visible canal hematoma. Disc levels: The spinal canal is fairly generous. No significant spinal stenosis. No large disc protrusions. Mild multilevel foraminal narrowing due to uncinate spurring and facet disease. Upper chest: Biapical pleural and parenchymal scarring changes. Other: No neck mass or adenopathy. IMPRESSION: 1. Age related cerebral atrophy, ventriculomegaly and periventricular white matter disease. 2. No acute intracranial findings or skull fracture. 3. Degenerative cervical spondylosis with multilevel disc disease and facet disease but no acute cervical spine fracture or canal stenosis. Electronically Signed   By: Marijo Sanes M.D.   On: 12/17/2018 13:20   Ct Cervical Spine Wo Contrast  Result Date: 12/17/2018 CLINICAL DATA:  [place] home.  Found down. EXAM: CT HEAD WITHOUT CONTRAST CT CERVICAL SPINE WITHOUT CONTRAST TECHNIQUE: Multidetector CT imaging of the head and cervical spine was performed following the standard protocol without intravenous contrast. Multiplanar CT image reconstructions of the cervical spine were also generated. COMPARISON:  Head CT 07/22/2012 FINDINGS: CT HEAD FINDINGS Brain: Age related cerebral atrophy, ventriculomegaly and periventricular white matter disease. No acute intracranial findings such as hemispheric infarction or intracranial hemorrhage. No extra-axial fluid collections are identified. The brainstem and cerebellum appear normal. Vascular: Vascular calcifications but no definite aneurysm or hyperdense vessels. Skull: No skull fracture or bone lesions. Sinuses/Orbits: The paranasal sinuses and mastoid air cells are clear. Other: No scalp hematoma or laceration. CT CERVICAL SPINE FINDINGS Alignment: Normal overall alignment. Skull base and vertebrae: Moderate degenerative changes involving the skull base C1 and C1-2. Moderate degenerative  cervical spondylosis with multilevel disc disease and facet disease. No acute cervical spine fracture is identified. Soft tissues and spinal canal: No prevertebral fluid or swelling. No visible canal hematoma. Disc levels: The spinal canal is fairly generous. No significant spinal stenosis. No large disc protrusions. Mild multilevel foraminal narrowing due to uncinate spurring and facet disease. Upper chest: Biapical pleural and parenchymal scarring changes. Other: No neck mass or adenopathy. IMPRESSION: 1. Age related cerebral atrophy, ventriculomegaly and periventricular white matter disease. 2. No acute intracranial findings or skull fracture. 3. Degenerative cervical spondylosis with multilevel disc disease and facet disease but no acute cervical spine fracture or canal stenosis. Electronically Signed   By: Marijo Sanes M.D.   On: 12/17/2018 13:20   Dg Chest Portable 1 View  Result Date: 12/17/2018 CLINICAL DATA:  Hematemesis. The patient fell today. EXAM: PORTABLE CHEST 1 VIEW COMPARISON:  Chest x-ray dated 05/03/2015 FINDINGS: The heart size and mediastinal contours are within normal limits. Both lungs are clear except for small chronic calcified granulomas at the right lung base. The visualized skeletal structures are unremarkable. IMPRESSION: 1. No active disease. 2. No acute rib fracture or bone destruction. Electronically Signed   By: Lorriane Shire M.D.   On: 12/17/2018 12:07    Procedures Procedures (including critical care time)  Medications Ordered in ED Medications  sodium chloride 0.9 % bolus 500 mL (0 mLs Intravenous Stopped 12/17/18 1218)    Followed by  0.9 %  sodium chloride infusion (1,000 mLs Intravenous New Bag/Given 12/17/18 1148)  Tdap (BOOSTRIX) injection 0.5 mL (0.5 mLs Intramuscular Given 12/17/18 1129)  neomycin-bacitracin-polymyxin (NEOSPORIN) ointment packet (1 application Topical Given 12/17/18 1135)     Initial Impression / Assessment and Plan / ED Course  I have  reviewed the triage vital signs and the nursing notes.  Pertinent labs & imaging results that were available during my care of the patient were reviewed by me and considered in my medical decision making (see chart for details).          Final Clinical Impressions(s) / ED Diagnoses MDM Patient is an 85 year old male who lives by himself.  He has a caregiver that comes by to check on him.  They found him in the floor, we are not sure as to how long.  He vomited some coffee-ground material on his face.  He had fecal material in his arms and legs. Patient is awake and alert here in the emergency department.  Stool for occult blood was found to be negative.  Troponin was elevated at 52.  Electrocardiogram was normal sinus rhythm without signs of acute ST elevation MI.  The ammonia level was normal at 15. The complete blood count showed the white blood cells to be elevated at 13.2.  There was no shift to the left.  The urine analysis shows a hazy amber specimen with a specific gravity of 1.027.  Is a moderate amount of blood on the dipstick.  And 23-50 red blood cells per high-powered field. CT scan of the head showed some atrophy, but no fracture and no evidence of acute intracranial abnormality.  There are degenerative changes of the cervical spine, but no fracture appreciated.  No canal stenosis noted. Chest x-ray shows no active disease, and no acute rib fracture.  I discussed the case with the triad hospitalist.  As the patient has a syncopal situation as well as an elevation of his CK levels.  Patient is in agreement for admission.   Final diagnoses:  Syncope and collapse  Traumatic rhabdomyolysis, initial encounter [facility])  Fall, initial encounter    ED Discharge Orders    None       Lily Kocher, PA-C 12/19/18 Parnell, [place], DO 12/22/18 [phone]

## 2018-12-17 NOTE — H&P (Signed)
Patient Demographics:    Edward Trevino, is a 85 y.o. male  MRN: [id number]   DOB - 06-Mar-1932  Admit Date - 12/17/2018  Outpatient Primary MD for the patient is [name], MD   Assessment & Plan:    Principal Problem:   Falls Active Problems:   HFrEF/Cardiomyopathy, ischemic-EF 20%   CAD/History of STEMI-Oct 2016- conservative Rx   PAFib/Atrial fibrillation   MDD (major depressive disorder), recurrent severe, without psychosis ([place])   Depression   1)Fall at home/possible syncope--fall was unwitnessed, no obvious injuries apart from above abrasions, get PT eval, rule out syncope especially given history of ischemic cardiomyopathy with EF in the 20% range and history of atrial fibrillation- so place patient on telemetry monitored unit, watch for arrhythmias, check serial troponins and EKG for rule out ACS protocol, check echocardiogram to rule out significant aortic stenosis or other outflow obstruction, and also to evaluate EF and to rule out segmental/Regional wall motion abnormalities. Check carotid artery Dopplers to rule out hemodynamically significant stenosis  2)Traumatic Rhabdo--- CK is almost 2000, hold Lipitor, hydrate and repeat CK, be judicious with IV fluids given low EF in the 20% range  3)AKI----acute kidney injury due to dehydration, creatinine on admission=1.3 ,   baseline creatinine =1.0    ,   , renally adjust medications, avoid nephrotoxic agents/dehydration/hypotension -Hold lisinopril, hydrate gently  4)HFrEF/ischemic cardiomyopathy--EF in the 20% range,, continue Coreg 6.25 mg twice daily, lisinopril on hold due to AKI-echo pending as above #1  5)NeuroPsych--followed members are concerned about diminished cognitive abilities and memory deficits consistent with advanced progressive dementia  ---will decrease Seroquel to 200 mg nightly to avoid oversedation which may contribute to falls, continue Effexor 75 mg daily  6)Social- patient lives alone, son and daughter-in-law live just behind the house-several feet away.... they have a nanny camera at patient's house, patient has a paid caregiver Ms [name] who comes into the house from [place] from Mondays through Fridays--- await physical therapy evaluation patient may need home health PT  7) leukocytosis-- ??  Reactive after fall, no evidence of acute infection at this time, repeat CBC in a.m.  With History of - Reviewed by me  Past Medical History:  Diagnosis Date   Anxiety    Bladder stones 05/22/2015   Choledocholithiasis with obstruction 07/25/2012   Depression    Duodenitis 05/11/15   Erosive esophagitis    Esophageal stricture 05/11/15   Dilated   Gastric out let obstruction    Gastric outlet obstruction 05/07/2015   HOH (hard of hearing)    Insomnia    Ischemic cardiomyopathy    LVEF 20%   Multiple duodenal ulcers 05/11/15   2  were present per EGD.   ST elevation myocardial infarction (STEMI) of anterior wall [facility])    October 2016 - late presentation, managed conservatively   Suicide attempt [facility])       Past Surgical History:  Procedure Laterality Date  APPENDECTOMY     BACK SURGERY     BALLOON DILATION N/A 07/24/2012   Procedure: BALLOON DILATION;  Surgeon: Daneil Dolin, MD;  Location: [facility];  Service: Endoscopy;  Laterality: N/A;  Balloon Stone Extraction, Drudging   BIOPSY N/A 07/24/2012   Procedure: BIOPSY;  Surgeon: Daneil Dolin, MD;  Location: [facility];  Service: Endoscopy;  Laterality: N/A;  Duodenal and Esophageal Biopsies   BIOPSY N/A 10/18/2012   Procedure: BIOPSY;  Surgeon: Daneil Dolin, MD;  Location: [facility];  Service: Endoscopy;  Laterality: N/A;   CHOLECYSTECTOMY N/A 10/26/2012   Procedure: LAPAROSCOPIC CHOLECYSTECTOMY;  Surgeon: Jamesetta So, MD;  Location: [facility];   Service: General;  Laterality: N/A;   EGD/ERCP  10/18/2012   Dr. Gala Romney: ;yloric channel stenosis, s/p dilation and bx, persisting CBD stones with extension of sphincterotomy and sphincterotomy balloon dilation and stone extraction. Removal of biliary stent   ERCP N/A 07/24/2012   Dr. Gala Romney. Significant abnormalities of the bowl and proximal second portion producing partial gastric outlet stricture and with secondary gastric dilation and severe erosive reflux esophagitis. Biopsy showed benign ulceration. Normal-appearing ampulla, status post biliary sphincterotomy and ampulla balloon dilation, stone extraction and stent placement.   ERCP N/A 10/18/2012   Procedure: ENDOSCOPIC RETROGRADE CHOLANGIOPANCREATOGRAPHY (ERCP);  Surgeon: Daneil Dolin, MD;  Location: [facility];  Service: Endoscopy;  Laterality: N/A;   ESOPHAGEAL DILATION N/A 05/15/2015   Procedure: ESOPHAGEAL DILATION;  Surgeon: Danie Binder, MD;  Location: [facility];  Service: Endoscopy;  Laterality: N/A;   ESOPHAGOGASTRODUODENOSCOPY N/A 07/24/2012   HUT:[id number] ampulla s/p biliary sphincterotomy and ampullary   ESOPHAGOGASTRODUODENOSCOPY N/A 05/07/2015   RMR: Severe esophagitis, retained food in the stomach precluded complete exam, pyloric channel/duodenal bulb abnormal with 1 cm ulcer, friability with high-grade gastric outlet obstruction. No dilation performed on Plavix and aspirin.   ESOPHAGOGASTRODUODENOSCOPY N/A 05/11/2015   Rehman: Distal esophageal ulcers with circumferential ulceration at distal 3-4 cm, soft stricture dilated with scope, diffuse erythema and edema in the bulbar mucosa with 2 small ulcers in the distal segment and high-grade stricture in the middle, scope could not be passed. Dilated up to 12 mm. Could not see duodenum beyond this point because the scope was retained in the large stomach.   ESOPHAGOGASTRODUODENOSCOPY N/A 05/15/2015   SLF: Esophagitis with stricture, dilated GE junction stricture up to  14 mm with the balloon, D1/D2 stricture with narrowing of the lumen to 10-11 mm, dilated up to 15 mm with balloon. Downstream duodenum appear normal. Small duodenal bulbar ulcer. Biopsies from the stomach benign gastritis, no H pylori.   ESOPHAGOGASTRODUODENOSCOPY (EGD) WITH PROPOFOL N/A 10/18/2012   XNT:[id number] channel stenosis s/p dilation/Persisting common bile duct stones with extension of sphincterotomy     and sphincterotomy balloon dilation and stone extraction.  Biliary stent removal    LAPAROSCOPY N/A 10/31/2012   Procedure: LAPAROSCOPY DIAGNOSTIC;  Surgeon: Donato Heinz, MD;  Location: [facility];  Service: General;  Laterality: N/A;   REMOVAL OF STONES N/A 07/24/2012   Procedure: REMOVAL OF STONES;  Surgeon: Daneil Dolin, MD;  Location: [facility];  Service: Endoscopy;  Laterality: N/A;   REMOVAL OF STONES N/A 10/18/2012   Procedure: REMOVAL OF STONES;  Surgeon: Daneil Dolin, MD;  Location: [facility];  Service: Endoscopy;  Laterality: N/A;   SPHINCTEROTOMY N/A 07/24/2012   Procedure: SPHINCTEROTOMY;  Surgeon: Daneil Dolin, MD;  Location: [facility];  Service: Endoscopy;  Laterality: N/A;      Chief  Complaint  Patient presents with   Hematemesis   Fall      HPI:    Edward Trevino  is a 85 y.o. 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  Coffee-ground emesis on his clothing  --Patient is a poor historian  I called and obtained history from patient's daughter-in-law and son (Edward and Edward Trevino)-  -Apparently patient lives alone, son and daughter-in-law live just behind the house-several feet away.... they have a nanny camera at  patient's house -6 AM patient was in bed ax per daughter-in-law --Around 8 AM patient was seen at the dinner table via the Nanny camera--- -patient has a paid caregiver Ms [name] who comes into the house from [place] from Mondays through Fridays--she found him on the floor in 1 of the bedroom just before 9 AM  --So patient was probably down for no more than 45 minutes or so  --Apparently patient has been increasingly more forgetful consistent with his dementia  -No fevers, no chills, may have had episode of emesis x1, no diarrhea  In ED cxr,, CT head and CT C-spine without acute findings -Creatinine is up to 1.3 from a baseline of 1.0, LFTs are not elevated except for alk phos of 141 -Highly sensitive troponin is flat at 53, repeat 52 --WBC is elevated at 13.2 H&H is 13.4 and 40.5 with a platelet count of 223 -UA is not suggestive of UTI -Serum ammonia is 15 -Fecal occult blood is negative and COVID-19 test is negative -CK is elevated at 1892   Review of systems:    In addition to the HPI above,   A full Review of  Systems was done, all other systems reviewed are negative except as noted above in HPI , .    Social History:  Reviewed by me   Social History   Tobacco Use   Smoking status: Current Every Day Smoker    Types: Cigars   Smokeless tobacco: Never Used   Tobacco comment: Smokes 4-5 cigars a day "but I don't really inhale."  Substance Use Topics   Alcohol use: No    Alcohol/week: 0.0 standard drinks      Family History :  Reviewed by me    Family History  Problem Relation Age of Onset   Depression Sister    Colon cancer Neg Hx    Heart disease Neg Hx      Home Medications:   Prior to Admission medications   Medication Sig Start Date End Date Taking? Authorizing Provider  aspirin EC 81 MG tablet Take 81 mg by mouth daily.   Yes [provider]  atorvastatin (LIPITOR) 80 MG tablet TAKE 1 TABLET BY MOUTH ONCE DAILY AT  6  PM Patient taking  differently: Take 80 mg by mouth daily.  07/25/17  Yes Fay Records, MD  carvedilol (COREG) 3.125 MG tablet Take 3.125 mg by mouth 2 (two) times daily with a meal.  12/05/18  Yes [provider]  ferrous sulfate 325 (65 FE) MG EC tablet Take 1 tablet by mouth 2 (two) times daily. 08/06/18  Yes [provider]  lisinopril (PRINIVIL,ZESTRIL) 2.5 MG tablet Take  1 tablet (2.5 mg total) by mouth daily. 09/14/16  Yes Fay Records, MD  nitroGLYCERIN (NITRODUR - DOSED IN MG/24 HR) 0.4 mg/hr patch APPLY ONE PATCH TOPICALLY ONCE DAILY Patient taking differently: Place 0.4 mg onto the skin daily.  06/16/17  Yes Fay Records, MD  pantoprazole (PROTONIX) 40 MG tablet Take 1 tablet (40 mg total) by mouth 2 (two) times daily before a meal. Patient taking differently: Take 40 mg by mouth daily.  08/25/15  Yes Fay Records, MD  QUEtiapine (SEROQUEL) 100 MG tablet Take 300 mg by mouth at bedtime.  01/28/15  Yes [provider]  Vitamin D, Ergocalciferol, (DRISDOL) 1.25 MG (50000 UT) CAPS capsule Take 50,000 Units by mouth once a week. 10/04/18  Yes [provider]  carvedilol (COREG) 6.25 MG tablet TAKE ONE TABLET BY MOUTH TWICE DAILY WITH A MEAL --  **NEEDS  OFFICE  VISIT** Patient not taking: No sig reported 03/24/16   Fay Records, MD  Venlafaxine HCl 75 MG TB24 Take 75 mg by mouth at bedtime. 01/28/15   [provider]     Allergies:    No Known Allergies   Physical Exam:   Vitals  Blood pressure 135/68, pulse 73, temperature 98 F (36.7 C), temperature source Oral, resp. rate 16, height '6\' 2"'  (1.88 m), weight 80 kg, SpO2 100 %.  Physical Examination: General appearance - alert, well appearing, and in no distress Mental status - alert, oriented to person, place, and time, Eyes - sclera anicteric Neck - supple, no JVD elevation , Chest - clear  to auscultation bilaterally, symmetrical air movement,  Heart - S1 and S2 normal, regular  Abdomen - soft, nontender,  nondistended, no masses or organomegaly Neurological/Psych -forgetful with cognitive deficits, generalized weakness, no new focal deficits per se  extremities - no pedal edema noted, intact peripheral pulses  Skin -bilateral upper arm/forearm/elbow area abrasions--- hemostatic     Data Review:    CBC Recent Labs  Lab 12/17/18 1112  WBC 13.2*  HGB 13.4  HCT 40.5  PLT 223  MCV 97.4  MCH 32.2  MCHC 33.1  RDW 13.3  LYMPHSABS 1.2  MONOABS 1.3*  EOSABS 0.0  BASOSABS 0.1   ------------------------------------------------------------------------------------------------------------------  Chemistries  Recent Labs  Lab 12/17/18 1112  NA 145  K 3.9  CL 112*  CO2 23  GLUCOSE 119*  BUN 13  CREATININE 1.33*  CALCIUM 9.1  AST 31  ALT 18  ALKPHOS 141*  BILITOT 0.6   ------------------------------------------------------------------------------------------------------------------ estimated creatinine clearance is 46.8 mL/min (A) (by C-G formula based on SCr of 1.33 mg/dL (H)). ------------------------------------------------------------------------------------------------------------------ No results for input(s): TSH, T4TOTAL, T3FREE, THYROIDAB in the last 72 hours.  Invalid input(s): FREET3   Coagulation profile No results for input(s): INR, PROTIME in the last 168 hours. ------------------------------------------------------------------------------------------------------------------- No results for input(s): DDIMER in the last 72 hours. -------------------------------------------------------------------------------------------------------------------  Cardiac Enzymes No results for input(s): CKMB, TROPONINI, MYOGLOBIN in the last 168 hours.  Invalid input(s): CK ------------------------------------------------------------------------------------------------------------------    Component Value Date/Time   BNP 232.0 (H) 05/20/2015 1210      ---------------------------------------------------------------------------------------------------------------  Urinalysis    Component Value Date/Time   COLORURINE AMBER (A) 12/17/2018 1112   APPEARANCEUR HAZY (A) 12/17/2018 1112   LABSPEC 1.027 12/17/2018 1112   PHURINE 5.0 12/17/2018 1112   GLUCOSEU NEGATIVE 12/17/2018 1112   HGBUR MODERATE (A) 12/17/2018 1112   BILIRUBINUR NEGATIVE 12/17/2018 1112   [place] 12/17/2018 1112   PROTEINUR 100 (A) 12/17/2018 1112  UROBILINOGEN 0.2 12/24/2012 1057   NITRITE NEGATIVE 12/17/2018 1112   LEUKOCYTESUR NEGATIVE 12/17/2018 1112    ----------------------------------------------------------------------------------------------------------------   Imaging Results:    Ct Head Wo Contrast  Result Date: 12/17/2018 CLINICAL DATA:  [place] at home.  Found down. EXAM: CT HEAD WITHOUT CONTRAST CT CERVICAL SPINE WITHOUT CONTRAST TECHNIQUE: Multidetector CT imaging of the head and cervical spine was performed following the standard protocol without intravenous contrast. Multiplanar CT image reconstructions of the cervical spine were also generated. COMPARISON:  Head CT 07/22/2012 FINDINGS: CT HEAD FINDINGS Brain: Age related cerebral atrophy, ventriculomegaly and periventricular white matter disease. No acute intracranial findings such as hemispheric infarction or intracranial hemorrhage. No extra-axial fluid collections are identified. The brainstem and cerebellum appear normal. Vascular: Vascular calcifications but no definite aneurysm or hyperdense vessels. Skull: No skull fracture or bone lesions. Sinuses/Orbits: The paranasal sinuses and mastoid air cells are clear. Other: No scalp hematoma or laceration. CT CERVICAL SPINE FINDINGS Alignment: Normal overall alignment. Skull base and vertebrae: Moderate degenerative changes involving the skull base C1 and C1-2. Moderate degenerative cervical spondylosis with multilevel disc disease and facet  disease. No acute cervical spine fracture is identified. Soft tissues and spinal canal: No prevertebral fluid or swelling. No visible canal hematoma. Disc levels: The spinal canal is fairly generous. No significant spinal stenosis. No large disc protrusions. Mild multilevel foraminal narrowing due to uncinate spurring and facet disease. Upper chest: Biapical pleural and parenchymal scarring changes. Other: No neck mass or adenopathy. IMPRESSION: 1. Age related cerebral atrophy, ventriculomegaly and periventricular white matter disease. 2. No acute intracranial findings or skull fracture. 3. Degenerative cervical spondylosis with multilevel disc disease and facet disease but no acute cervical spine fracture or canal stenosis. Electronically Signed   By: Marijo Sanes M.D.   On: 12/17/2018 13:20   Ct Cervical Spine Wo Contrast  Result Date: 12/17/2018 CLINICAL DATA:  [place] home.  Found down. EXAM: CT HEAD WITHOUT CONTRAST CT CERVICAL SPINE WITHOUT CONTRAST TECHNIQUE: Multidetector CT imaging of the head and cervical spine was performed following the standard protocol without intravenous contrast. Multiplanar CT image reconstructions of the cervical spine were also generated. COMPARISON:  Head CT 07/22/2012 FINDINGS: CT HEAD FINDINGS Brain: Age related cerebral atrophy, ventriculomegaly and periventricular white matter disease. No acute intracranial findings such as hemispheric infarction or intracranial hemorrhage. No extra-axial fluid collections are identified. The brainstem and cerebellum appear normal. Vascular: Vascular calcifications but no definite aneurysm or hyperdense vessels. Skull: No skull fracture or bone lesions. Sinuses/Orbits: The paranasal sinuses and mastoid air cells are clear. Other: No scalp hematoma or laceration. CT CERVICAL SPINE FINDINGS Alignment: Normal overall alignment. Skull base and vertebrae: Moderate degenerative changes involving the skull base C1 and C1-2. Moderate degenerative  cervical spondylosis with multilevel disc disease and facet disease. No acute cervical spine fracture is identified. Soft tissues and spinal canal: No prevertebral fluid or swelling. No visible canal hematoma. Disc levels: The spinal canal is fairly generous. No significant spinal stenosis. No large disc protrusions. Mild multilevel foraminal narrowing due to uncinate spurring and facet disease. Upper chest: Biapical pleural and parenchymal scarring changes. Other: No neck mass or adenopathy. IMPRESSION: 1. Age related cerebral atrophy, ventriculomegaly and periventricular white matter disease. 2. No acute intracranial findings or skull fracture. 3. Degenerative cervical spondylosis with multilevel disc disease and facet disease but no acute cervical spine fracture or canal stenosis. Electronically Signed   By: Marijo Sanes M.D.   On: 12/17/2018 13:20   Dg Chest  Portable 1 View  Result Date: 12/17/2018 CLINICAL DATA:  Hematemesis. The patient fell today. EXAM: PORTABLE CHEST 1 VIEW COMPARISON:  Chest x-ray dated 05/03/2015 FINDINGS: The heart size and mediastinal contours are within normal limits. Both lungs are clear except for small chronic calcified granulomas at the right lung base. The visualized skeletal structures are unremarkable. IMPRESSION: 1. No active disease. 2. No acute rib fracture or bone destruction. Electronically Signed   By: Lorriane Shire M.D.   On: 12/17/2018 12:07    Radiological Exams on Admission: Ct Head Wo Contrast  Result Date: 12/17/2018 CLINICAL DATA:  [place] home.  Found down. EXAM: CT HEAD WITHOUT CONTRAST CT CERVICAL SPINE WITHOUT CONTRAST TECHNIQUE: Multidetector CT imaging of the head and cervical spine was performed following the standard protocol without intravenous contrast. Multiplanar CT image reconstructions of the cervical spine were also generated. COMPARISON:  Head CT 07/22/2012 FINDINGS: CT HEAD FINDINGS Brain: Age related cerebral atrophy, ventriculomegaly and  periventricular white matter disease. No acute intracranial findings such as hemispheric infarction or intracranial hemorrhage. No extra-axial fluid collections are identified. The brainstem and cerebellum appear normal. Vascular: Vascular calcifications but no definite aneurysm or hyperdense vessels. Skull: No skull fracture or bone lesions. Sinuses/Orbits: The paranasal sinuses and mastoid air cells are clear. Other: No scalp hematoma or laceration. CT CERVICAL SPINE FINDINGS Alignment: Normal overall alignment. Skull base and vertebrae: Moderate degenerative changes involving the skull base C1 and C1-2. Moderate degenerative cervical spondylosis with multilevel disc disease and facet disease. No acute cervical spine fracture is identified. Soft tissues and spinal canal: No prevertebral fluid or swelling. No visible canal hematoma. Disc levels: The spinal canal is fairly generous. No significant spinal stenosis. No large disc protrusions. Mild multilevel foraminal narrowing due to uncinate spurring and facet disease. Upper chest: Biapical pleural and parenchymal scarring changes. Other: No neck mass or adenopathy. IMPRESSION: 1. Age related cerebral atrophy, ventriculomegaly and periventricular white matter disease. 2. No acute intracranial findings or skull fracture. 3. Degenerative cervical spondylosis with multilevel disc disease and facet disease but no acute cervical spine fracture or canal stenosis. Electronically Signed   By: Marijo Sanes M.D.   On: 12/17/2018 13:20   Ct Cervical Spine Wo Contrast  Result Date: 12/17/2018 CLINICAL DATA:  [place] at home.  Found down. EXAM: CT HEAD WITHOUT CONTRAST CT CERVICAL SPINE WITHOUT CONTRAST TECHNIQUE: Multidetector CT imaging of the head and cervical spine was performed following the standard protocol without intravenous contrast. Multiplanar CT image reconstructions of the cervical spine were also generated. COMPARISON:  Head CT 07/22/2012 FINDINGS: CT HEAD  FINDINGS Brain: Age related cerebral atrophy, ventriculomegaly and periventricular white matter disease. No acute intracranial findings such as hemispheric infarction or intracranial hemorrhage. No extra-axial fluid collections are identified. The brainstem and cerebellum appear normal. Vascular: Vascular calcifications but no definite aneurysm or hyperdense vessels. Skull: No skull fracture or bone lesions. Sinuses/Orbits: The paranasal sinuses and mastoid air cells are clear. Other: No scalp hematoma or laceration. CT CERVICAL SPINE FINDINGS Alignment: Normal overall alignment. Skull base and vertebrae: Moderate degenerative changes involving the skull base C1 and C1-2. Moderate degenerative cervical spondylosis with multilevel disc disease and facet disease. No acute cervical spine fracture is identified. Soft tissues and spinal canal: No prevertebral fluid or swelling. No visible canal hematoma. Disc levels: The spinal canal is fairly generous. No significant spinal stenosis. No large disc protrusions. Mild multilevel foraminal narrowing due to uncinate spurring and facet disease. Upper chest: Biapical pleural and parenchymal scarring  changes. Other: No neck mass or adenopathy. IMPRESSION: 1. Age related cerebral atrophy, ventriculomegaly and periventricular white matter disease. 2. No acute intracranial findings or skull fracture. 3. Degenerative cervical spondylosis with multilevel disc disease and facet disease but no acute cervical spine fracture or canal stenosis. Electronically Signed   By: Marijo Sanes M.D.   On: 12/17/2018 13:20   Dg Chest Portable 1 View  Result Date: 12/17/2018 CLINICAL DATA:  Hematemesis. The patient fell today. EXAM: PORTABLE CHEST 1 VIEW COMPARISON:  Chest x-ray dated 05/03/2015 FINDINGS: The heart size and mediastinal contours are within normal limits. Both lungs are clear except for small chronic calcified granulomas at the right lung base. The visualized skeletal structures  are unremarkable. IMPRESSION: 1. No active disease. 2. No acute rib fracture or bone destruction. Electronically Signed   By: Lorriane Shire M.D.   On: 12/17/2018 12:07    DVT Prophylaxis -SCD  /heparin  AM Labs Ordered, also please review Full Orders  Family Communication: Admission, patients condition and plan of care including tests being ordered have been discussed with the patient and daughter-in-law Edward who indicate understanding and agree with the plan   Code Status - Full Code  Likely DC to  TBD  Condition   Stable  Roxan Hockey M.D on 12/17/2018 at 4:18 PM Go to [email or web address] -  for contact info  [facility] - Office  [phone]

## 2018-12-17 NOTE — ED Triage Notes (Signed)
Pt arrived via [place] c/o fall at home. Pt states that he does not remember how he fell. No complaints of pain or injury. He was found by a caregiver. N/V via transport.

## 2018-12-17 NOTE — ED Triage Notes (Signed)
Pt brought in by ems from home. EMS left before speaking with a nurse regarding why pt is here. Pt is confused and obviously has not had a bath in a while. Has coffee ground emesis on side of face and clothing. Unsure when pt fell or when vomiting blood started. Pt reports he lives alone and is not sure who called ems to come get him.

## 2018-12-18 ENCOUNTER — Observation Stay (HOSPITAL_COMMUNITY): Payer: Medicare Other

## 2018-12-18 ENCOUNTER — Other Ambulatory Visit (HOSPITAL_COMMUNITY): Payer: Self-pay | Admitting: *Deleted

## 2018-12-18 DIAGNOSIS — F419 Anxiety disorder, unspecified: Secondary | ICD-10-CM | POA: Diagnosis present

## 2018-12-18 DIAGNOSIS — T796XXA Traumatic ischemia of muscle, initial encounter: Secondary | ICD-10-CM | POA: Diagnosis present

## 2018-12-18 DIAGNOSIS — K2211 Ulcer of esophagus with bleeding: Secondary | ICD-10-CM | POA: Diagnosis present

## 2018-12-18 DIAGNOSIS — I351 Nonrheumatic aortic (valve) insufficiency: Secondary | ICD-10-CM | POA: Diagnosis not present

## 2018-12-18 DIAGNOSIS — F0390 Unspecified dementia without behavioral disturbance: Secondary | ICD-10-CM | POA: Diagnosis present

## 2018-12-18 DIAGNOSIS — I252 Old myocardial infarction: Secondary | ICD-10-CM | POA: Diagnosis not present

## 2018-12-18 DIAGNOSIS — N179 Acute kidney failure, unspecified: Secondary | ICD-10-CM | POA: Diagnosis present

## 2018-12-18 DIAGNOSIS — Z915 Personal history of self-harm: Secondary | ICD-10-CM | POA: Diagnosis not present

## 2018-12-18 DIAGNOSIS — E876 Hypokalemia: Secondary | ICD-10-CM | POA: Diagnosis present

## 2018-12-18 DIAGNOSIS — I361 Nonrheumatic tricuspid (valve) insufficiency: Secondary | ICD-10-CM | POA: Diagnosis not present

## 2018-12-18 DIAGNOSIS — I4891 Unspecified atrial fibrillation: Secondary | ICD-10-CM | POA: Diagnosis not present

## 2018-12-18 DIAGNOSIS — Z818 Family history of other mental and behavioral disorders: Secondary | ICD-10-CM | POA: Diagnosis not present

## 2018-12-18 DIAGNOSIS — Z23 Encounter for immunization: Secondary | ICD-10-CM | POA: Diagnosis present

## 2018-12-18 DIAGNOSIS — Z20828 Contact with and (suspected) exposure to other viral communicable diseases: Secondary | ICD-10-CM | POA: Diagnosis present

## 2018-12-18 DIAGNOSIS — Z7982 Long term (current) use of aspirin: Secondary | ICD-10-CM | POA: Diagnosis not present

## 2018-12-18 DIAGNOSIS — I5022 Chronic systolic (congestive) heart failure: Secondary | ICD-10-CM | POA: Diagnosis present

## 2018-12-18 DIAGNOSIS — F17200 Nicotine dependence, unspecified, uncomplicated: Secondary | ICD-10-CM | POA: Diagnosis present

## 2018-12-18 DIAGNOSIS — W19XXXA Unspecified fall, initial encounter: Secondary | ICD-10-CM | POA: Diagnosis present

## 2018-12-18 DIAGNOSIS — K219 Gastro-esophageal reflux disease without esophagitis: Secondary | ICD-10-CM | POA: Diagnosis present

## 2018-12-18 DIAGNOSIS — R55 Syncope and collapse: Secondary | ICD-10-CM | POA: Diagnosis present

## 2018-12-18 DIAGNOSIS — F332 Major depressive disorder, recurrent severe without psychotic features: Secondary | ICD-10-CM | POA: Diagnosis present

## 2018-12-18 DIAGNOSIS — Z79899 Other long term (current) drug therapy: Secondary | ICD-10-CM | POA: Diagnosis not present

## 2018-12-18 DIAGNOSIS — I255 Ischemic cardiomyopathy: Secondary | ICD-10-CM | POA: Diagnosis present

## 2018-12-18 DIAGNOSIS — Z8711 Personal history of peptic ulcer disease: Secondary | ICD-10-CM | POA: Diagnosis not present

## 2018-12-18 DIAGNOSIS — E860 Dehydration: Secondary | ICD-10-CM | POA: Diagnosis present

## 2018-12-18 DIAGNOSIS — E872 Acidosis: Secondary | ICD-10-CM | POA: Diagnosis not present

## 2018-12-18 DIAGNOSIS — I480 Paroxysmal atrial fibrillation: Secondary | ICD-10-CM | POA: Diagnosis present

## 2018-12-18 DIAGNOSIS — I2510 Atherosclerotic heart disease of native coronary artery without angina pectoris: Secondary | ICD-10-CM | POA: Diagnosis present

## 2018-12-18 DIAGNOSIS — Y92009 Unspecified place in unspecified non-institutional (private) residence as the place of occurrence of the external cause: Secondary | ICD-10-CM | POA: Diagnosis not present

## 2018-12-18 DIAGNOSIS — D72829 Elevated white blood cell count, unspecified: Secondary | ICD-10-CM | POA: Diagnosis present

## 2018-12-18 LAB — CBC
HCT: 32.7 % — ABNORMAL LOW (ref 39.0–52.0)
Hemoglobin: 10.8 g/dL — ABNORMAL LOW (ref 13.0–17.0)
MCH: 32.4 pg (ref 26.0–34.0)
MCHC: 33 g/dL (ref 30.0–36.0)
MCV: 98.2 fL (ref 80.0–100.0)
Platelets: 160 10*3/uL (ref 150–400)
RBC: 3.33 MIL/uL — ABNORMAL LOW (ref 4.22–5.81)
RDW: 13.3 % (ref 11.5–15.5)
WBC: 7.7 10*3/uL (ref 4.0–10.5)
nRBC: 0 % (ref 0.0–0.2)

## 2018-12-18 LAB — BASIC METABOLIC PANEL
Anion gap: 7 (ref 5–15)
BUN: 12 mg/dL (ref 8–23)
CO2: 21 mmol/L — ABNORMAL LOW (ref 22–32)
Calcium: 8.1 mg/dL — ABNORMAL LOW (ref 8.9–10.3)
Chloride: 115 mmol/L — ABNORMAL HIGH (ref 98–111)
Creatinine, Ser: 0.99 mg/dL (ref 0.61–1.24)
GFR calc Af Amer: >60 mL/min (ref >60)
GFR calc non Af Amer: >60 mL/min (ref >60)
Glucose, Bld: 102 mg/dL — ABNORMAL HIGH (ref 70–99)
Potassium: 3.1 mmol/L — ABNORMAL LOW (ref 3.5–5.1)
Sodium: 143 mmol/L (ref 135–145)

## 2018-12-18 LAB — ECHOCARDIOGRAM COMPLETE
Height: 74 in
Weight: 2821.89 oz

## 2018-12-18 LAB — CK: Total CK: 3897 U/L — ABNORMAL HIGH (ref 49–397)

## 2018-12-18 MED ORDER — POTASSIUM CHLORIDE CRYS ER 20 MEQ PO TBCR
40.0000 meq | EXTENDED_RELEASE_TABLET | Freq: Four times a day (QID) | ORAL | Status: AC
  Administered 2018-12-18 (×2): 40 meq via ORAL
  Filled 2018-12-18 (×2): qty 2

## 2018-12-18 NOTE — Evaluation (Signed)
Physical Therapy Evaluation Patient Details Name: Edward Trevino MRN: [id number] DOB: [birth year] Today's Date: 12/18/2018   History of Present Illness  Edward Trevino  is a 85 y.o. 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  Coffee-ground emesis on his clothing    Clinical Impression  Patient requires encouragement to participate with therapy, once agreeable, demonstrated labored movement for sitting up at bedside and ambulated in hallway without loss of balance, limited secondary to c/o fatigue and tolerated sitting up in chair after therapy.  Patient will benefit from continued physical therapy in hospital and recommended venue below to increase strength, balance, endurance for safe ADLs and gait.    Follow Up Recommendations Home health PT;Supervision for mobility/OOB;Supervision - Intermittent    Equipment Recommendations  None recommended by PT    Recommendations for Other Services       Precautions / Restrictions Precautions Precautions: Fall Restrictions Weight Bearing Restrictions: No      Mobility  Bed Mobility Overal bed mobility: Needs Assistance Bed Mobility: Supine to Sit     Supine to sit: Min guard;Min assist     General bed mobility comments: increased time, labored movement  Transfers Overall transfer level: Needs assistance Equipment used: Rolling walker (2 wheeled) Transfers: Sit to/from [facility] Sit to Stand: Min guard;Min assist Stand pivot transfers: Min guard;Min assist       General transfer comment: slightly labored movement  Ambulation/Gait Ambulation/Gait assistance: Min  guard;Min assist Gait Distance (Feet): 45 Feet Assistive device: Rolling walker (2 wheeled) Gait Pattern/deviations: Decreased step length - right;Decreased step length - left;Decreased stride length Gait velocity: decreased   General Gait Details: slightly labored slow cadence without loss of balance, limited secondary to c/o fatigue  Stairs            Wheelchair Mobility    Modified Rankin (Stroke Patients Only)       Balance Overall balance assessment: Needs assistance Sitting-balance support: Feet supported;No upper extremity supported Sitting balance-Leahy Scale: Good     Standing balance support: During functional activity;Bilateral upper extremity supported Standing balance-Leahy Scale: Fair Standing balance comment: using RW                             Pertinent Vitals/Pain Pain Assessment: No/denies pain    Home Living Family/patient expects to be discharged to:: Private residence Living Arrangements: Alone Available Help at Discharge: Family;Personal care attendant;Available PRN/intermittently Type of Home: Mobile home Home Access: Ramped entrance     Home Layout: One level Home Equipment: Cane - single point      Prior Function Level of Independence: Needs assistance   Gait / Transfers Assistance Needed: household ambulator without AD  ADL's / Homemaking Assistance Needed: Caregiver from 9 am to 3 pm M-F        Hand Dominance        Extremity/Trunk Assessment   Upper Extremity Assessment Upper Extremity Assessment: Generalized weakness    Lower Extremity Assessment Lower Extremity Assessment: Generalized weakness    Cervical / Trunk Assessment Cervical / Trunk Assessment: Normal  Communication   Communication: No difficulties  Cognition Arousal/Alertness:  Awake/alert Behavior During Therapy: WFL for tasks assessed/performed Overall Cognitive Status: Within Functional Limits for tasks assessed                                         General Comments      Exercises     Assessment/Plan    PT Assessment Patient needs continued PT services  PT Problem List Decreased strength;Decreased activity tolerance;Decreased balance;Decreased mobility       PT Treatment Interventions Gait training;Functional mobility training;Therapeutic activities;Therapeutic exercise;Wheelchair mobility training    PT Goals (Current goals can be found in the Care Plan section)  Acute Rehab PT Goals Patient Stated Goal: return home with assistance PT Goal Formulation: With patient Time For Goal Achievement: 12/22/18 Potential to Achieve Goals: Good    Frequency Min 3X/week   Barriers to discharge        Co-evaluation               AM-PAC PT "6 Clicks" Mobility  Outcome Measure Help needed turning from your back to your side while in a flat bed without using bedrails?: None Help needed moving from lying on your back to sitting on the side of a flat bed without using bedrails?: A Little Help needed moving to and from a bed to a chair (including a wheelchair)?: A Little Help needed standing up from a chair using your arms (e.g., wheelchair or bedside chair)?: A Little Help needed to walk in hospital room?: A Little Help needed climbing 3-5 steps with a railing? : A Lot 6 Click Score: 18    End of Session   Activity Tolerance: Patient tolerated treatment well;Patient limited by fatigue Patient left: with call bell/phone within reach;in chair;with chair alarm set Nurse Communication: Mobility status PT Visit Diagnosis: Unsteadiness on feet (R26.81);Other abnormalities of gait and mobility (R26.89);Muscle weakness (generalized) (M62.81)    Time: 1423-9532 PT Time Calculation (min) (ACUTE ONLY): 28 min   Charges:   PT Evaluation $PT Eval Moderate Complexity: 1 Mod PT Treatments $Therapeutic Activity: 23-37 mins        12:09 PM, 12/18/18 Lonell Grandchild, MPT Physical Therapist with  [facility] 336 [phone] office [phone] mobile phone

## 2018-12-18 NOTE — Plan of Care (Signed)
  Problem: Acute Rehab PT Goals(only PT should resolve) Goal: Pt Will Go Supine/Side To Sit Outcome: Progressing Flowsheets (Taken 12/18/2018 1212) Pt will go Supine/Side to Sit: with supervision Goal: Patient Will Perform Sitting Balance Outcome: Progressing Flowsheets (Taken 12/18/2018 1212) Patient will perform sitting balance: with supervision Goal: Patient Will Transfer Sit To/From Stand Outcome: Progressing Flowsheets (Taken 12/18/2018 1212) Patient will transfer sit to/from stand: with supervision Goal: Pt Will Ambulate Outcome: Progressing Flowsheets (Taken 12/18/2018 1212) Pt will Ambulate:  75 feet  with min guard assist  with rolling walker   12:12 PM, 12/18/18 Lonell Grandchild, MPT Physical Therapist with [facility] 336 [phone] office [phone] mobile phone

## 2018-12-18 NOTE — Progress Notes (Signed)
*  PRELIMINARY RESULTS* Echocardiogram 2D Echocardiogram has been performed.  Edward Trevino 12/18/2018, 11:54 AM

## 2018-12-18 NOTE — TOC Initial Note (Signed)
Transition of Care [facility]) - Initial/Assessment Note    Patient Details  Name: Edward Trevino MRN: [id number] Date of Birth: 1931/11/28  Transition of Care [facility]) CM/SW Contact:    Boneta Lucks, RN Phone Number: 12/18/2018, 2:22 PM  Clinical Narrative:    Patient admitted for a fall, Lives at home with a day time sitter, his Son/ daughter in law  Lives next door.  PT recommended HHPT, CM called  Tim - son  to get home health preference. He was not aware of the admission.  Carlene called to inform CM that one brother is mean and one a liar. They do not get along.  CM will update chart Tommy/Carlene - POA they live next door and pay for sitters and take care of the patient. They have nanny cams to check on patient when sitter is not there.  Tim called them yelling and told them CM said he had the best insurance in the whole and we could provided 24/7 care. Also Tim told them CM said they let patient lay in the floor all night and that is why he had to be admitted.  None of these statement are true and Carlene state she knows he was just yelling at them and making this up. She is a CNA and knows we would not say or promise everything he was stating. She called to update patient charge with them a POA and statesthey want to use [facility]. They have also hired another Actuary and another family member will stay at night so the patient will have 24/7care when he is discharged. Vaughan Basta will [facility] took the referral.              Expected Discharge Plan: [place] Barriers to Discharge: Continued Medical Work up   Patient Goals and CMS Choice   CMS Medicare.gov Compare Post Acute Care list provided to:: Patient Represenative (must comment) Choice offered to / list presented to : [place] / Guardian  Expected Discharge Plan and Services Expected Discharge Plan: [place]   Discharge Planning Services: CM Consult Post Acute Care Choice: [place] arrangements for the past 2 months: [place] Arranged: RN, PT [place] Agency: [place] ([place]) Date [place]: 12/18/18 Time [place]: 1418 Representative spoke with at [place]: [place] Arrangements/Services Living arrangements for the past 2 months: [place] with:: Self Patient language and need for interpreter reviewed:: Yes Do you feel safe going back to the place where you live?: Yes      Need for Family Participation in Patient Care: Yes (Comment) Care giver support system in place?: Yes (comment)   Criminal Activity/Legal Involvement Pertinent to Current Situation/Hospitalization: No - Comment as needed  Activities of Daily Living Home Assistive Devices/Equipment: Cane (specify quad or straight) ADL Screening (condition at time of admission) Patient's cognitive ability adequate to safely complete daily activities?: Yes Is the patient deaf or have difficulty hearing?: No Does the patient have difficulty seeing, even when wearing glasses/contacts?: Yes Does the patient have difficulty concentrating, remembering, or making decisions?: Yes Patient able to express need for assistance with ADLs?: Yes Does the patient have difficulty dressing or bathing?: Yes Independently performs ADLs?: Yes (appropriate for developmental age) Communication: Independent  Dressing (OT): Needs assistance Is this a change from baseline?: Pre-admission baseline Grooming: Needs assistance Is this a change from baseline?: Pre-admission baseline Feeding: Needs assistance Is this a change from baseline?: Pre-admission baseline Bathing: Needs assistance Is this a change from baseline?: Pre-admission baseline Toileting: Needs assistance Is this a change from baseline?: Pre-admission baseline In/Out Bed: Needs assistance Is this a change from baseline?: Pre-admission  baseline Walks in Home: Independent with device (comment) Does the patient have difficulty walking or climbing stairs?: Yes Weakness of Legs: None Weakness of Arms/Hands: None  Permission Sought/Granted Permission sought to share information with : Case Manager, Family Supports    Share Information with NAME: Romualdo Bolk  Permission granted to share info w AGENCY: [place] granted to share info w Relationship: Carlene/ Tommy     Emotional Assessment       Orientation: : Oriented to Self Alcohol / Substance Use: Not Applicable Psych Involvement: No (comment)  Admission diagnosis:  Fall Patient Active Problem List   Diagnosis Date Noted  . Syncope and collapse 12/18/2018  . Falls 12/17/2018  . Constipation 06/11/2015  . Rectal bleeding 06/11/2015  . Bladder stones 05/22/2015  . Acute duodenal ulcer with gastric outlet obstruction   . Normocytic anemia 05/21/2015  . Duodenal stricture   . Abnormal serum level of alkaline phosphatase   . Nausea and vomiting 05/20/2015  . Esophageal stricture 05/20/2015  . Dysphagia 05/20/2015  . Depression 05/20/2015  . Duodenal ulcer 05/20/2015  . Acute esophagitis   . Gastric outflow obstruction   . Peptic ulcer   . Gastric outlet obstruction 05/07/2015  . Hematemesis with nausea   . Loss of weight   . CAD/History of STEMI-Oct 2016- conservative Rx 05/06/2015  . Dyslipidemia 05/06/2015  . PAFib/Atrial fibrillation 05/06/2015  . Orthostatic hypotension 05/05/2015  . HFrEF/Cardiomyopathy, ischemic-EF 20% 05/05/2015  . Chronic systolic CHF (congestive heart failure) ([place]) 05/05/2015  . Chest pain with moderate risk of acute coronary syndrome 05/03/2015  . Acute coronary syndrome ([place]) 02/28/2015  . MDD (major depressive disorder), recurrent severe, without psychosis ([place]) 12/19/2012  . Insomnia secondary to depression with anxiety 12/19/2012  . Anxiety   . Abnormal LFTs 10/01/2012  . Gallstones 10/01/2012   . Anemia 10/01/2012  . Erosive esophagitis 07/25/2012  . Gastric out let obstruction 07/25/2012  . Choledocholithiasis with obstruction 07/25/2012    Class: History of  . Obstructive jaundice 07/23/2012   PCP:  [name], MD Pharmacy:   [place], [place] - [address] [id number] [place] [address] Phone: [phone] Fax: [phone]     Social Determinants of Health (SDOH) Interventions    Readmission Risk Interventions No flowsheet data found.

## 2018-12-18 NOTE — Progress Notes (Signed)
PROGRESS NOTE                                                                                                                                                                                                             Patient Demographics:    Edward Trevino, is a 85 y.o. male, DOB - 02-19-1932, MGQ:[id number]  Admit date - 12/17/2018   Admitting Physician Courage Denton Brick, MD  Outpatient Primary MD for the patient is [name], MD  LOS - 0   Chief Complaint  Patient presents with   Hematemesis   Fall       Brief Narrative    85 y.o. 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    Subjective:    Edward Trevino today self denies any complaints, per staff no significant events overnight .   Assessment  & Plan :    Principal Problem:   Falls Active Problems:   MDD (major depressive disorder), recurrent severe, without psychosis ([place])   HFrEF/Cardiomyopathy, ischemic-EF 20%   CAD/History of STEMI-Oct 2016- conservative Rx   PAFib/Atrial fibrillation   Depression    Fall at home/possible syncope -Unwitnessed fall at home, patient is poor historian, cannot provide any reliable history. -On syncope work-up, follow on 2D echo, carotid Dopplers. -No significant events on telemetry -We will check orthostasis. -PT consult - history of ischemic cardiomyopathy with EF in the 20% range and history of atrial fibrillation.  Traumatic Rhabdo -CK on admission was 1800, it had doubled overnight  To 3800 despite receiving IV fluids, continue with gentle hydration  at 50 cc/h (given EF of 20%  -Trend CK daily  AKI - acute kidney injury due to dehydration,  baseline creatinine is 1, was 1.3 on admission . -Resolved  HFrEF/ischemic cardiomyopathy--EF in the 20% range - continue Coreg 6.25 mg twice daily, lisinopril on hold .  NeuroPsych -followed members are concerned about diminished cognitive abilities and memory deficits consistent with advanced progressive dementia  - decreased Seroquel to  200 mg nightly to avoid oversedation which may contribute to falls, continue Effexor 75 mg daily  Hypokalemia -Repleted, recheck in a.m.  Social- patient lives alone, son and daughter-in-law live just behind the house-several feet away.... they have a nanny camera at patient's house, patient has a paid caregiver Ms [name] who comes into the house from [place] from Mondays through Fridays--- await physical therapy evaluation patient may need home health PT  Leukocytosis -Resolved, stress related from fall  COVID-19 Labs  No results for input(s): DDIMER, FERRITIN, LDH, CRP in the last 72 hours.  Lab Results  Component Value Date   [place] NEGATIVE 12/17/2018     Code Status : Full  Family Communication  : D/W daughter in [place]  Disposition Plan  : Pending PT consult, but likely home with home health as patient lives close by family  Barriers For Discharge : Still pending further work-up, and patient with rhabdomyolysis, with total CK trending up, will need more monitoring, still needs IV fluids for his rhabdo(will be at AA gentle rate given EF of 20%).  Consults  :  none  Procedures  : None  DVT Prophylaxis  : Heparin  Lab Results  Component Value Date   PLT 160 12/18/2018    Antibiotics  :    Anti-infectives (From admission, onward)   None        Objective:   Vitals:   12/17/18 1420 12/17/18 1430 12/17/18 2115 12/18/18 0606  BP: 137/71 135/68 127/80 127/66  Pulse: 71 73 69 66  Resp: 18 16 19 18   Temp: 98.4  F (36.9 C) 98 F (36.7 C) 98.6 F (37 C) 97.9 F (36.6 C)  TempSrc:  Oral Oral Oral  SpO2: 100% 100% 100% 99%  Weight:      Height:        Wt Readings from Last 3 Encounters:  12/17/18 80 kg  02/20/18 79.8 kg  06/08/16 88.5 kg     Intake/Output Summary (Last 24 hours) at 12/18/2018 1009 Last data filed at 12/17/2018 1700 Gross per 24 hour  Intake 740 ml  Output --  Net 740 ml     Physical Exam  Awake Alert, Oriented X 1, confused Symmetrical Chest wall movement, Good air movement bilaterally, CTAB RRR,No Gallops,Rubs or new Murmurs, No Parasternal Heave +ve B.Sounds, Abd Soft, No tenderness No rebound - guarding or rigidity. No Cyanosis, Clubbing or edema, No new Rash or bruise      Data Review:    CBC Recent Labs  Lab 12/17/18 1112 12/18/18 0602  WBC 13.2* 7.7  HGB 13.4 10.8*  HCT 40.5 32.7*  PLT 223 160  MCV 97.4 98.2  MCH 32.2 32.4  MCHC 33.1 33.0  RDW 13.3 13.3  LYMPHSABS 1.2  --   MONOABS 1.3*  --   EOSABS 0.0  --   BASOSABS 0.1  --     Chemistries  Recent Labs  Lab 12/17/18 1112 12/18/18 0602  NA 145 143  K 3.9 3.1*  CL 112* 115*  CO2 23 21*  GLUCOSE 119* 102*  BUN 13 12  CREATININE 1.33* 0.99  CALCIUM 9.1 8.1*  AST 31  --   ALT 18  --   ALKPHOS 141*  --   BILITOT 0.6  --    ------------------------------------------------------------------------------------------------------------------ No results for input(s): CHOL, HDL, LDLCALC, TRIG, CHOLHDL, LDLDIRECT in the last 72 hours.  Lab Results  Component Value Date   HGBA1C  02/08/2010    5.6 (NOTE)  According to the ADA Clinical Practice Recommendations for 2011, when HbA1c is used as a screening test:   >=6.5%   Diagnostic of Diabetes Mellitus           (if abnormal result  is confirmed)  5.7-6.4%   Increased risk of developing Diabetes Mellitus  References:Diagnosis and Classification of Diabetes  [place],Diabetes Care,2011,34(Suppl 1):S62-S69 and Standards of Medical Care in         Diabetes - 2011,Diabetes OXBD,5329,92  (Suppl 1):S11-S61.   ------------------------------------------------------------------------------------------------------------------ No results for input(s): TSH, T4TOTAL, T3FREE, THYROIDAB in the last 72 hours.  Invalid input(s): FREET3 ------------------------------------------------------------------------------------------------------------------ No results for input(s): VITAMINB12, FOLATE, FERRITIN, TIBC, IRON, RETICCTPCT in the last 72 hours.  Coagulation profile No results for input(s): INR, PROTIME in the last 168 hours.  No results for input(s): DDIMER in the last 72 hours.  Cardiac Enzymes No results for input(s): CKMB, TROPONINI, MYOGLOBIN in the last 168 hours.  Invalid input(s): CK ------------------------------------------------------------------------------------------------------------------    Component Value Date/Time   BNP 232.0 (H) 05/20/2015 1210    Inpatient Medications  Scheduled Meds:  aspirin EC  81 mg Oral Daily   carvedilol  6.25 mg Oral BID WC   ferrous sulfate  325 mg Oral BID WC   heparin  5,000 Units Subcutaneous Q8H   megestrol  400 mg Oral Daily   pantoprazole  40 mg Oral BID AC   potassium chloride  40 mEq Oral Q6H   QUEtiapine  200 mg Oral QHS   sodium chloride flush  3 mL Intravenous Q12H   venlafaxine XR  75 mg Oral QHS   [START ON 12/21/2018] Vitamin D (Ergocalciferol)  50,000 Units Oral Weekly   Continuous Infusions:  sodium chloride     sodium chloride 50 mL/hr at 12/17/18 1834   PRN Meds:.sodium chloride, acetaminophen **OR** acetaminophen, albuterol, hydrALAZINE, ondansetron **OR** ondansetron (ZOFRAN) IV, oxyCODONE, polyethylene glycol, sodium chloride flush  Micro Results Recent Results (from the past 240 hour(s))  SARS Coronavirus 2 [facility] order, Performed in [facility] hospital  lab) Nasopharyngeal Nasopharyngeal Swab     Status: None   Collection Time: 12/17/18  2:22 PM   Specimen: Nasopharyngeal Swab  Result Value Ref Range Status   SARS Coronavirus 2 NEGATIVE NEGATIVE Final    Comment: (NOTE) If result is NEGATIVE SARS-CoV-2 target nucleic acids are NOT DETECTED. The SARS-CoV-2 RNA is generally detectable in upper and lower  respiratory specimens during the acute phase of infection. The lowest  concentration of SARS-CoV-2 viral copies this assay can detect is 250  copies / mL. A negative result does not preclude SARS-CoV-2 infection  and should not be used as the sole basis for treatment or other  patient management decisions.  A negative result may occur with  improper specimen collection / handling, submission of specimen other  than nasopharyngeal swab, presence of viral mutation(s) within the  areas targeted by this assay, and inadequate number of viral copies  (<250 copies / mL). A negative result must be combined with clinical  observations, patient history, and epidemiological information. If result is POSITIVE SARS-CoV-2 target nucleic acids are DETECTED. The SARS-CoV-2 RNA is generally detectable in upper and lower  respiratory specimens dur ing the acute phase of infection.  Positive  results are indicative of active infection with SARS-CoV-2.  Clinical  correlation with patient history and other diagnostic information is  necessary to determine patient infection status.  Positive results do  not rule out bacterial infection or co-infection with other viruses. If result is  PRESUMPTIVE POSTIVE SARS-CoV-2 nucleic acids MAY BE PRESENT.   A presumptive positive result was obtained on the submitted specimen  and confirmed on repeat testing.  While 2019 novel coronavirus  (SARS-CoV-2) nucleic acids may be present in the submitted sample  additional confirmatory testing may be necessary for epidemiological  and / or clinical management purposes  to  differentiate between  SARS-CoV-2 and other Sarbecovirus currently known to infect humans.  If clinically indicated additional testing with an alternate test  methodology [phone]) is advised. The SARS-CoV-2 RNA is generally  detectable in upper and lower respiratory sp ecimens during the acute  phase of infection. The expected result is Negative. Fact Sheet for Patients:  StrictlyIdeas.no Fact Sheet for Healthcare Providers: BankingDealers.co.za This test is not yet approved or cleared by the [place] FDA and has been authorized for detection and/or diagnosis of SARS-CoV-2 by FDA under an Emergency Use Authorization (EUA).  This EUA will remain in effect (meaning this test can be used) for the duration of the COVID-19 declaration under Section 564(b)(1) of the Act, 21 U.S.C. section 360bbb-3(b)(1), unless the authorization is terminated or revoked sooner. Performed at [facility], [address], [place], [place] [address]     Radiology Reports Ct Head Wo Contrast  Result Date: 12/17/2018 CLINICAL DATA:  [place] at home.  Found down. EXAM: CT HEAD WITHOUT CONTRAST CT CERVICAL SPINE WITHOUT CONTRAST TECHNIQUE: Multidetector CT imaging of the head and cervical spine was performed following the standard protocol without intravenous contrast. Multiplanar CT image reconstructions of the cervical spine were also generated. COMPARISON:  Head CT 07/22/2012 FINDINGS: CT HEAD FINDINGS Brain: Age related cerebral atrophy, ventriculomegaly and periventricular white matter disease. No acute intracranial findings such as hemispheric infarction or intracranial hemorrhage. No extra-axial fluid collections are identified. The brainstem and cerebellum appear normal. Vascular: Vascular calcifications but no definite aneurysm or hyperdense vessels. Skull: No skull fracture or bone lesions. Sinuses/Orbits: The paranasal sinuses and mastoid air cells are clear.  Other: No scalp hematoma or laceration. CT CERVICAL SPINE FINDINGS Alignment: Normal overall alignment. Skull base and vertebrae: Moderate degenerative changes involving the skull base C1 and C1-2. Moderate degenerative cervical spondylosis with multilevel disc disease and facet disease. No acute cervical spine fracture is identified. Soft tissues and spinal canal: No prevertebral fluid or swelling. No visible canal hematoma. Disc levels: The spinal canal is fairly generous. No significant spinal stenosis. No large disc protrusions. Mild multilevel foraminal narrowing due to uncinate spurring and facet disease. Upper chest: Biapical pleural and parenchymal scarring changes. Other: No neck mass or adenopathy. IMPRESSION: 1. Age related cerebral atrophy, ventriculomegaly and periventricular white matter disease. 2. No acute intracranial findings or skull fracture. 3. Degenerative cervical spondylosis with multilevel disc disease and facet disease but no acute cervical spine fracture or canal stenosis. Electronically Signed   By: Marijo Sanes M.D.   On: 12/17/2018 13:20   Ct Cervical Spine Wo Contrast  Result Date: 12/17/2018 CLINICAL DATA:  [place] at home.  Found down. EXAM: CT HEAD WITHOUT CONTRAST CT CERVICAL SPINE WITHOUT CONTRAST TECHNIQUE: Multidetector CT imaging of the head and cervical spine was performed following the standard protocol without intravenous contrast. Multiplanar CT image reconstructions of the cervical spine were also generated. COMPARISON:  Head CT 07/22/2012 FINDINGS: CT HEAD FINDINGS Brain: Age related cerebral atrophy, ventriculomegaly and periventricular white matter disease. No acute intracranial findings such as hemispheric infarction or intracranial hemorrhage. No extra-axial fluid collections are identified. The brainstem and cerebellum appear normal. Vascular: Vascular  calcifications but no definite aneurysm or hyperdense vessels. Skull: No skull fracture or bone lesions.  Sinuses/Orbits: The paranasal sinuses and mastoid air cells are clear. Other: No scalp hematoma or laceration. CT CERVICAL SPINE FINDINGS Alignment: Normal overall alignment. Skull base and vertebrae: Moderate degenerative changes involving the skull base C1 and C1-2. Moderate degenerative cervical spondylosis with multilevel disc disease and facet disease. No acute cervical spine fracture is identified. Soft tissues and spinal canal: No prevertebral fluid or swelling. No visible canal hematoma. Disc levels: The spinal canal is fairly generous. No significant spinal stenosis. No large disc protrusions. Mild multilevel foraminal narrowing due to uncinate spurring and facet disease. Upper chest: Biapical pleural and parenchymal scarring changes. Other: No neck mass or adenopathy. IMPRESSION: 1. Age related cerebral atrophy, ventriculomegaly and periventricular white matter disease. 2. No acute intracranial findings or skull fracture. 3. Degenerative cervical spondylosis with multilevel disc disease and facet disease but no acute cervical spine fracture or canal stenosis. Electronically Signed   By: Marijo Sanes M.D.   On: 12/17/2018 13:20   Dg Chest Portable 1 View  Result Date: 12/17/2018 CLINICAL DATA:  Hematemesis. The patient fell today. EXAM: PORTABLE CHEST 1 VIEW COMPARISON:  Chest x-ray dated 05/03/2015 FINDINGS: The heart size and mediastinal contours are within normal limits. Both lungs are clear except for small chronic calcified granulomas at the right lung base. The visualized skeletal structures are unremarkable. IMPRESSION: 1. No active disease. 2. No acute rib fracture or bone destruction. Electronically Signed   By: Lorriane Shire M.D.   On: 12/17/2018 12:07    Phillips Climes M.D on 12/18/2018 at 10:09 AM  Between 7am to 7pm - Pager - [phone]  After 7pm go to [email or web address] - password [email or web address]  [facility] -  Office  [phone]

## 2018-12-19 DIAGNOSIS — T796XXA Traumatic ischemia of muscle, initial encounter: Secondary | ICD-10-CM

## 2018-12-19 LAB — BASIC METABOLIC PANEL
Anion gap: 7 (ref 5–15)
BUN: 10 mg/dL (ref 8–23)
CO2: 22 mmol/L (ref 22–32)
Calcium: 8.4 mg/dL — ABNORMAL LOW (ref 8.9–10.3)
Chloride: 112 mmol/L — ABNORMAL HIGH (ref 98–111)
Creatinine, Ser: 0.95 mg/dL (ref 0.61–1.24)
GFR calc Af Amer: >60 mL/min (ref >60)
GFR calc non Af Amer: >60 mL/min (ref >60)
Glucose, Bld: 97 mg/dL (ref 70–99)
Potassium: 3.7 mmol/L (ref 3.5–5.1)
Sodium: 141 mmol/L (ref 135–145)

## 2018-12-19 LAB — CBC
HCT: 33 % — ABNORMAL LOW (ref 39.0–52.0)
Hemoglobin: 10.6 g/dL — ABNORMAL LOW (ref 13.0–17.0)
MCH: 32.2 pg (ref 26.0–34.0)
MCHC: 32.1 g/dL (ref 30.0–36.0)
MCV: 100.3 fL — ABNORMAL HIGH (ref 80.0–100.0)
Platelets: 158 10*3/uL (ref 150–400)
RBC: 3.29 MIL/uL — ABNORMAL LOW (ref 4.22–5.81)
RDW: 13.2 % (ref 11.5–15.5)
WBC: 7.3 10*3/uL (ref 4.0–10.5)
nRBC: 0 % (ref 0.0–0.2)

## 2018-12-19 LAB — CK: Total CK: 3899 U/L — ABNORMAL HIGH (ref 49–397)

## 2018-12-19 NOTE — Progress Notes (Signed)
PROGRESS NOTE                                                                                                                                                                                                             Patient Demographics:    Edward Trevino, is a 85 y.o. male, DOB - [birth year], HYI:[id number]  Admit date - 12/17/2018   Admitting Physician Courage Denton Brick, MD  Outpatient Primary MD for the patient is [name], MD  LOS - 1   Chief Complaint  Patient presents with   Hematemesis   Fall       Brief Narrative    85 y.o. 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    Subjective:    [name] patient is a poor historian.  Denies any complaints.   Assessment  & Plan :    Principal Problem:   Falls Active Problems:   MDD (major depressive disorder), recurrent severe, without psychosis ([place])   HFrEF/Cardiomyopathy, ischemic-EF 20%   CAD/History of STEMI-Oct 2016- conservative Rx   PAFib/Atrial fibrillation   Depression   Syncope and collapse  Fall at home/possible syncope -Unwitnessed fall at home, patient is poor historian, cannot provide any reliable history. -2D echocardiogram showed normal EF, carotid Dopplers negative -No significant events on telemetry -Orthostatic vital signs positive which is likely the cause of his fall/syncope -PT consult, recommends home health physical therapy - history of ischemic cardiomyopathy with EF previously in the 20% range, but current echocardiogram shows improvement of EF to 55 to 60%    Traumatic Rhabdo -CK on admission was 1800, it had doubled to 3800 despite receiving IV fluids, and remains elevated.  Continue IV hydration -Trend CK daily  AKI - acute kidney injury due to dehydration,  baseline creatinine is 1, was 1.3 on admission . -Resolved  HFrEF/ischemic cardiomyopathy--EF was previously in the 20% range, current echo shows ejection fraction of 55 to 60%. - continue Coreg 6.25 mg twice daily, lisinopril on hold .  NeuroPsych -followed members are concerned about diminished cognitive abilities and memory deficits consistent with advanced progressive dementia  - decreased Seroquel to 200 mg nightly to avoid oversedation which may contribute to falls, continue Effexor 75 mg daily  Hypokalemia -Repleted, recheck in a.m.  Social- patient lives alone, son and daughter-in-law live just behind the house-several feet away.... they have a nanny camera at patient's house, patient has a paid caregiver Ms [name] who comes into the house from [place] from Mondays through Fridays---  physical therapy recommends home health physical therapy  Leukocytosis -Resolved, stress related from fall  COVID-19 Labs  No results for input(s): DDIMER, FERRITIN, LDH, CRP in the last 72 hours.  Lab Results  Component Value Date   [place] NEGATIVE 12/17/2018     Code Status : Full  Family Communication  : D/W son Tommy 8/5  Disposition Plan  : Anticipate discharge home in the morning if CK level is trending down and orthostasis has resolved  Consults  :  none  Procedures  : Echo  1. The left ventricle has normal systolic function, with an ejection fraction of 55-60%. The cavity size was normal. Left ventricular diastolic Doppler parameters are consistent with impaired relaxation.  2. The right ventricle has normal systolic function. The cavity was normal. There is no increase in right ventricular wall thickness.  3. No evidence of mitral valve stenosis.  4. The  aortic valve has an indeterminate number of cusps. Aortic valve regurgitation is mild by color flow Doppler. Mild-moderate stenosis of the aortic valve.  5. The aorta is normal in size and structure.  6. The aortic root is normal in size and structure.  DVT Prophylaxis  : Heparin  Lab Results  Component Value Date   PLT 158 12/19/2018    Antibiotics  :    Anti-infectives (From admission, onward)   None        Objective:   Vitals:   12/18/18 0606 12/18/18 2208 12/19/18 0455 12/19/18 1417  BP: 127/66 121/75 123/67 (!) 109/56  Pulse: 66 71 70 68  Resp: 18 18 17 16   Temp: 97.9 F (36.6 C) 98.6 F (37 C) 98.2 F (36.8 C) 98.2 F (36.8 C)  TempSrc: Oral Oral Oral Oral  SpO2: 99% 100% 100% 98%  Weight:      Height:        Wt Readings from Last 3 Encounters:  12/17/18 80 kg  02/20/18 79.8 kg  06/08/16 88.5 kg     Intake/Output Summary (Last 24 hours) at 12/19/2018 1858 Last data filed at 12/19/2018 1844 Gross per 24 hour  Intake 960 ml  Output 600 ml  Net 360 ml     Physical Exam  General exam: Alert, awake, no distress Respiratory system: Clear to auscultation. Respiratory effort normal. Cardiovascular system:RRR. No murmurs, rubs, gallops. Gastrointestinal system: Abdomen is nondistended, soft and nontender. No organomegaly or masses felt. Normal bowel sounds heard. Central nervous system: No focal neurological deficits. Extremities: No C/C/E, +pedal pulses Skin: No rashes, lesions or ulcers Psychiatry: Judgement and insight appear normal. Mood & affect appropriate.      Data Review:    CBC Recent Labs  Lab 12/17/18 1112 12/18/18 0602 12/19/18 0649  WBC 13.2* 7.7 7.3  HGB 13.4 10.8* 10.6*  HCT 40.5 32.7* 33.0*  PLT 223 160 158  MCV 97.4 98.2 100.3*  MCH 32.2 32.4 32.2  MCHC 33.1 33.0 32.1  RDW 13.3 13.3 13.2  LYMPHSABS 1.2  --   --  MONOABS 1.3*  --   --   EOSABS 0.0  --   --   BASOSABS 0.1  --   --     Chemistries  Recent Labs  Lab  12/17/18 1112 12/18/18 0602 12/19/18 0649  NA 145 143 141  K 3.9 3.1* 3.7  CL 112* 115* 112*  CO2 23 21* 22  GLUCOSE 119* 102* 97  BUN 13 12 10   CREATININE 1.33* 0.99 0.95  CALCIUM 9.1 8.1* 8.4*  AST 31  --   --   ALT 18  --   --   ALKPHOS 141*  --   --   BILITOT 0.6  --   --    ------------------------------------------------------------------------------------------------------------------ No results for input(s): CHOL, HDL, LDLCALC, TRIG, CHOLHDL, LDLDIRECT in the last 72 hours.  Lab Results  Component Value Date   HGBA1C  02/08/2010    5.6 (NOTE)                                                                       According to the ADA Clinical Practice Recommendations for 2011, when HbA1c is used as a screening test:   >=6.5%   Diagnostic of Diabetes Mellitus           (if abnormal result  is confirmed)  5.7-6.4%   Increased risk of developing Diabetes Mellitus  References:Diagnosis and Classification of Diabetes Mellitus,Diabetes Care,2011,34(Suppl 1):S62-S69 and Standards of Medical Care in         Diabetes - 2011,Diabetes GXQJ,1941,74  (Suppl 1):S11-S61.   ------------------------------------------------------------------------------------------------------------------ No results for input(s): TSH, T4TOTAL, T3FREE, THYROIDAB in the last 72 hours.  Invalid input(s): FREET3 ------------------------------------------------------------------------------------------------------------------ No results for input(s): VITAMINB12, FOLATE, FERRITIN, TIBC, IRON, RETICCTPCT in the last 72 hours.  Coagulation profile No results for input(s): INR, PROTIME in the last 168 hours.  No results for input(s): DDIMER in the last 72 hours.  Cardiac Enzymes No results for input(s): CKMB, TROPONINI, MYOGLOBIN in the last 168 hours.  Invalid input(s): CK ------------------------------------------------------------------------------------------------------------------    Component Value  Date/Time   BNP 232.0 (H) 05/20/2015 1210    Inpatient Medications  Scheduled Meds:  aspirin EC  81 mg Oral Daily   carvedilol  6.25 mg Oral BID WC   ferrous sulfate  325 mg Oral BID WC   heparin  5,000 Units Subcutaneous Q8H   megestrol  400 mg Oral Daily   pantoprazole  40 mg Oral BID AC   QUEtiapine  200 mg Oral QHS   sodium chloride flush  3 mL Intravenous Q12H   venlafaxine XR  75 mg Oral QHS   [START ON 12/21/2018] Vitamin D (Ergocalciferol)  50,000 Units Oral Weekly   Continuous Infusions:  sodium chloride     sodium chloride 50 mL/hr at 12/19/18 1704   PRN Meds:.sodium chloride, acetaminophen **OR** acetaminophen, albuterol, hydrALAZINE, ondansetron **OR** ondansetron (ZOFRAN) IV, oxyCODONE, polyethylene glycol, sodium chloride flush  Micro Results Recent Results (from the past 240 hour(s))  SARS Coronavirus 2 [facility] order, Performed in [facility] hospital lab) Nasopharyngeal Nasopharyngeal Swab     Status: None   Collection Time: 12/17/18  2:22 PM   Specimen: Nasopharyngeal Swab  Result Value Ref Range Status   SARS Coronavirus 2 NEGATIVE NEGATIVE Final  Comment: (NOTE) If result is NEGATIVE SARS-CoV-2 target nucleic acids are NOT DETECTED. The SARS-CoV-2 RNA is generally detectable in upper and lower  respiratory specimens during the acute phase of infection. The lowest  concentration of SARS-CoV-2 viral copies this assay can detect is 250  copies / mL. A negative result does not preclude SARS-CoV-2 infection  and should not be used as the sole basis for treatment or other  patient management decisions.  A negative result may occur with  improper specimen collection / handling, submission of specimen other  than nasopharyngeal swab, presence of viral mutation(s) within the  areas targeted by this assay, and inadequate number of viral copies  (<250 copies / mL). A negative result must be combined with clinical  observations, patient history, and  epidemiological information. If result is POSITIVE SARS-CoV-2 target nucleic acids are DETECTED. The SARS-CoV-2 RNA is generally detectable in upper and lower  respiratory specimens dur ing the acute phase of infection.  Positive  results are indicative of active infection with SARS-CoV-2.  Clinical  correlation with patient history and other diagnostic information is  necessary to determine patient infection status.  Positive results do  not rule out bacterial infection or co-infection with other viruses. If result is PRESUMPTIVE POSTIVE SARS-CoV-2 nucleic acids MAY BE PRESENT.   A presumptive positive result was obtained on the submitted specimen  and confirmed on repeat testing.  While 2019 novel coronavirus  (SARS-CoV-2) nucleic acids may be present in the submitted sample  additional confirmatory testing may be necessary for epidemiological  and / or clinical management purposes  to differentiate between  SARS-CoV-2 and other Sarbecovirus currently known to infect humans.  If clinically indicated additional testing with an alternate test  methodology [phone]) is advised. The SARS-CoV-2 RNA is generally  detectable in upper and lower respiratory sp ecimens during the acute  phase of infection. The expected result is Negative. Fact Sheet for Patients:  StrictlyIdeas.no Fact Sheet for Healthcare Providers: BankingDealers.co.za This test is not yet approved or cleared by the [place] FDA and has been authorized for detection and/or diagnosis of SARS-CoV-2 by FDA under an Emergency Use Authorization (EUA).  This EUA will remain in effect (meaning this test can be used) for the duration of the COVID-19 declaration under Section 564(b)(1) of the Act, 21 U.S.C. section 360bbb-3(b)(1), unless the authorization is terminated or revoked sooner. Performed at [facility], [address] [place], [place] [address]     Radiology  Reports Ct Head Wo Contrast  Result Date: 12/17/2018 CLINICAL DATA:  [place] at home.  Found down. EXAM: CT HEAD WITHOUT CONTRAST CT CERVICAL SPINE WITHOUT CONTRAST TECHNIQUE: Multidetector CT imaging of the head and cervical spine was performed following the standard protocol without intravenous contrast. Multiplanar CT image reconstructions of the cervical spine were also generated. COMPARISON:  Head CT 07/22/2012 FINDINGS: CT HEAD FINDINGS Brain: Age related cerebral atrophy, ventriculomegaly and periventricular white matter disease. No acute intracranial findings such as hemispheric infarction or intracranial hemorrhage. No extra-axial fluid collections are identified. The brainstem and cerebellum appear normal. Vascular: Vascular calcifications but no definite aneurysm or hyperdense vessels. Skull: No skull fracture or bone lesions. Sinuses/Orbits: The paranasal sinuses and mastoid air cells are clear. Other: No scalp hematoma or laceration. CT CERVICAL SPINE FINDINGS Alignment: Normal overall alignment. Skull base and vertebrae: Moderate degenerative changes involving the skull base C1 and C1-2. Moderate degenerative cervical spondylosis with multilevel disc disease and facet disease. No acute cervical spine fracture is identified. Soft  tissues and spinal canal: No prevertebral fluid or swelling. No visible canal hematoma. Disc levels: The spinal canal is fairly generous. No significant spinal stenosis. No large disc protrusions. Mild multilevel foraminal narrowing due to uncinate spurring and facet disease. Upper chest: Biapical pleural and parenchymal scarring changes. Other: No neck mass or adenopathy. IMPRESSION: 1. Age related cerebral atrophy, ventriculomegaly and periventricular white matter disease. 2. No acute intracranial findings or skull fracture. 3. Degenerative cervical spondylosis with multilevel disc disease and facet disease but no acute cervical spine fracture or canal stenosis. Electronically  Signed   By: Marijo Sanes M.D.   On: 12/17/2018 13:20   Ct Cervical Spine Wo Contrast  Result Date: 12/17/2018 CLINICAL DATA:  [place] at home.  Found down. EXAM: CT HEAD WITHOUT CONTRAST CT CERVICAL SPINE WITHOUT CONTRAST TECHNIQUE: Multidetector CT imaging of the head and cervical spine was performed following the standard protocol without intravenous contrast. Multiplanar CT image reconstructions of the cervical spine were also generated. COMPARISON:  Head CT 07/22/2012 FINDINGS: CT HEAD FINDINGS Brain: Age related cerebral atrophy, ventriculomegaly and periventricular white matter disease. No acute intracranial findings such as hemispheric infarction or intracranial hemorrhage. No extra-axial fluid collections are identified. The brainstem and cerebellum appear normal. Vascular: Vascular calcifications but no definite aneurysm or hyperdense vessels. Skull: No skull fracture or bone lesions. Sinuses/Orbits: The paranasal sinuses and mastoid air cells are clear. Other: No scalp hematoma or laceration. CT CERVICAL SPINE FINDINGS Alignment: Normal overall alignment. Skull base and vertebrae: Moderate degenerative changes involving the skull base C1 and C1-2. Moderate degenerative cervical spondylosis with multilevel disc disease and facet disease. No acute cervical spine fracture is identified. Soft tissues and spinal canal: No prevertebral fluid or swelling. No visible canal hematoma. Disc levels: The spinal canal is fairly generous. No significant spinal stenosis. No large disc protrusions. Mild multilevel foraminal narrowing due to uncinate spurring and facet disease. Upper chest: Biapical pleural and parenchymal scarring changes. Other: No neck mass or adenopathy. IMPRESSION: 1. Age related cerebral atrophy, ventriculomegaly and periventricular white matter disease. 2. No acute intracranial findings or skull fracture. 3. Degenerative cervical spondylosis with multilevel disc disease and facet disease but no  acute cervical spine fracture or canal stenosis. Electronically Signed   By: Marijo Sanes M.D.   On: 12/17/2018 13:20   US Carotid Bilateral  Result Date: 12/18/2018 CLINICAL DATA:  85 year old male with a history of syncope EXAM: BILATERAL CAROTID DUPLEX ULTRASOUND TECHNIQUE: Pearline Cables scale imaging, color Doppler and duplex ultrasound were performed of bilateral carotid and vertebral arteries in the neck. COMPARISON:  None. FINDINGS: Criteria: Quantification of carotid stenosis is based on velocity parameters that correlate the residual internal carotid diameter with NASCET-based stenosis levels, using the diameter of the distal internal carotid lumen as the denominator for stenosis measurement. The following velocity measurements were obtained: RIGHT ICA:  Systolic 70 cm/sec, Diastolic 18 cm/sec CCA:  58 cm/sec SYSTOLIC ICA/CCA RATIO:  1.2 ECA:  34 cm/sec LEFT ICA:  Systolic 74 cm/sec, Diastolic 18 cm/sec CCA:  69 cm/sec SYSTOLIC ICA/CCA RATIO:  69.4 ECA:  52 cm/sec Right Brachial SBP: Not acquired Left Brachial SBP: Not acquired RIGHT CAROTID ARTERY: No significant calcified disease of the right common carotid artery. Intermediate waveform maintained. Heterogeneous plaque without significant calcifications at the right carotid bifurcation. Low resistance waveform of the right ICA. No significant tortuosity. RIGHT VERTEBRAL ARTERY: Antegrade flow with low resistance waveform. LEFT CAROTID ARTERY: No significant calcified disease of the left common carotid artery. Intermediate waveform maintained.  Heterogeneous plaque at the left carotid bifurcation without significant calcifications. Low resistance waveform of the left ICA. LEFT VERTEBRAL ARTERY:  Antegrade flow with low resistance waveform. IMPRESSION: Color duplex indicates minimal heterogeneous plaque, with no hemodynamically significant stenosis by duplex criteria in the extracranial cerebrovascular circulation. Signed, Dulcy Fanny. Dellia Nims, RPVI [facility] Radiology Electronically Signed   By: Corrie Mckusick D.O.   On: 12/18/2018 11:48   Dg Chest Portable 1 View  Result Date: 12/17/2018 CLINICAL DATA:  Hematemesis. The patient fell today. EXAM: PORTABLE CHEST 1 VIEW COMPARISON:  Chest x-ray dated 05/03/2015 FINDINGS: The heart size and mediastinal contours are within normal limits. Both lungs are clear except for small chronic calcified granulomas at the right lung base. The visualized skeletal structures are unremarkable. IMPRESSION: 1. No active disease. 2. No acute rib fracture or bone destruction. Electronically Signed   By: Lorriane Shire M.D.   On: 12/17/2018 12:07    Kathie Dike M.D on 12/19/2018 at 6:58 PM  After 7pm go to [email or web address]   [facility]

## 2018-12-20 LAB — BASIC METABOLIC PANEL
Anion gap: 6 (ref 5–15)
BUN: 11 mg/dL (ref 8–23)
CO2: 20 mmol/L — ABNORMAL LOW (ref 22–32)
Calcium: 8 mg/dL — ABNORMAL LOW (ref 8.9–10.3)
Chloride: 114 mmol/L — ABNORMAL HIGH (ref 98–111)
Creatinine, Ser: 1.12 mg/dL (ref 0.61–1.24)
GFR calc Af Amer: >60 mL/min (ref >60)
GFR calc non Af Amer: 60 mL/min — ABNORMAL LOW (ref >60)
Glucose, Bld: 95 mg/dL (ref 70–99)
Potassium: 3.8 mmol/L (ref 3.5–5.1)
Sodium: 140 mmol/L (ref 135–145)

## 2018-12-20 LAB — CK: Total CK: 3072 U/L — ABNORMAL HIGH (ref 49–397)

## 2018-12-20 MED ORDER — SODIUM BICARBONATE 8.4 % IV SOLN
INTRAVENOUS | Status: DC
  Administered 2018-12-20 – 2018-12-21 (×2): via INTRAVENOUS
  Filled 2018-12-20 (×6): qty 50

## 2018-12-20 NOTE — Progress Notes (Signed)
Requested immodium for diarrhea.  Due to patient's Ogilvie's Syndrome Dr. Denton Brick declined to give order.

## 2018-12-20 NOTE — Progress Notes (Signed)
Physical Therapy Treatment Patient Details Name: Edward Trevino MRN: [id number] DOB: 15-Jun-1931 Today's Date: 12/20/2018    History of Present Illness Edward Trevino  is a 85 y.o. 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  Coffee-ground emesis on his clothing    PT Comments    Patient agreeable for therapy after encouragement, demonstrates slightly labored movement for sitting up at bedside, improvement for sit to stands/transfers with less assistance, increased endurance/distance for ambulation in hallway without loss of balance, had to slow cadence once fatigued and tolerated sitting up in chair after therapy - RN notified.  Patient will benefit from continued physical therapy in hospital and recommended venue below to increase strength, balance, endurance for safe ADLs and gait.    Follow Up Recommendations  Home health PT;Supervision for mobility/OOB;Supervision - Intermittent     Equipment Recommendations  None recommended by PT    Recommendations for Other Services       Precautions / Restrictions Precautions Precautions: Fall Restrictions Weight Bearing Restrictions: No    Mobility  Bed Mobility Overal bed mobility: Needs Assistance Bed Mobility: Supine to Sit     Supine to sit: Min guard;Min assist     General bed mobility comments: slightly labored movement  Transfers Overall transfer level: Needs assistance Equipment used: Rolling walker (2 wheeled) Transfers: Sit to/from [facility] Sit to Stand: Supervision;Min guard Stand pivot transfers: Supervision;Min guard       General transfer  comment: demonstrates increased BLE strength for sit to stands/transfers  Ambulation/Gait Ambulation/Gait assistance: Min guard Gait Distance (Feet): 100 Feet Assistive device: Rolling walker (2 wheeled) Gait Pattern/deviations: Decreased step length - right;Decreased step length - left;Decreased stride length Gait velocity: decreased   General Gait Details: increased endurance/distance for ambulation with slightly labored cadence, no loss of balance, kyphotic posture   Stairs             Wheelchair Mobility    Modified Rankin (Stroke Patients Only)       Balance Overall balance assessment: Needs assistance Sitting-balance support: Feet supported;No upper extremity supported Sitting balance-Leahy Scale: Good     Standing balance support: During functional activity;Bilateral upper extremity supported Standing balance-Leahy Scale: Fair Standing balance comment: using RW                            Cognition Arousal/Alertness: Awake/alert Behavior During Therapy: WFL for tasks assessed/performed Overall Cognitive Status: Within Functional Limits for tasks assessed                                        Exercises General Exercises - Lower Extremity Long Arc Quad: Seated;AROM;Strengthening;Both;5 reps Hip Flexion/Marching: Seated;AROM;Strengthening;Both;5 reps Toe Raises: Seated;AROM;Strengthening;Both;10 reps Heel Raises: Seated;AROM;Strengthening;Both;10 reps    General Comments        Pertinent Vitals/Pain Pain Assessment: No/denies pain    Home Living                      Prior Function  PT Goals (current goals can now be found in the care plan section) Acute Rehab PT Goals Patient Stated Goal: return home with assistance PT Goal Formulation: With patient Time For Goal Achievement: 12/22/18 Potential to Achieve Goals: Good Progress towards PT goals: Progressing toward goals    Frequency    Min  3X/week      PT Plan Current plan remains appropriate    Co-evaluation              AM-PAC PT "6 Clicks" Mobility   Outcome Measure  Help needed turning from your back to your side while in a flat bed without using bedrails?: None Help needed moving from lying on your back to sitting on the side of a flat bed without using bedrails?: A Little Help needed moving to and from a bed to a chair (including a wheelchair)?: A Little Help needed standing up from a chair using your arms (e.g., wheelchair or bedside chair)?: A Little Help needed to walk in hospital room?: A Little Help needed climbing 3-5 steps with a railing? : A Lot 6 Click Score: 18    End of Session   Activity Tolerance: Patient tolerated treatment well;Patient limited by fatigue Patient left: with call bell/phone within reach;in chair;with chair alarm set Nurse Communication: Mobility status PT Visit Diagnosis: Unsteadiness on feet (R26.81);Other abnormalities of gait and mobility (R26.89);Muscle weakness (generalized) (M62.81)     Time: 0539-7673 PT Time Calculation (min) (ACUTE ONLY): 22 min  Charges:  $Gait Training: 8-22 mins $Therapeutic Exercise: 8-22 mins                     3:16 PM, 12/20/18 Lonell Grandchild, MPT Physical Therapist with [facility] [phone] office [phone] mobile phone

## 2018-12-20 NOTE — Progress Notes (Signed)
PROGRESS NOTE                                                                                                                                                                                                             Patient Demographics:    Edward Trevino, is a 85 y.o. male, DOB - [birth year], ZOX:[id number]  Admit date - 12/17/2018   Admitting Physician Vincent Streater Denton Brick, MD  Outpatient Primary MD for the patient is [name], MD  LOS - 2   Chief Complaint  Patient presents with   Hematemesis   Fall       Brief Narrative    85 y.o. 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    Subjective:    Edward Trevino patient is a poor historian.  Denies any complaints.  -Eating okay, CK is above 3000, will change IV fluids to bicarb fluids   Assessment  & Plan :    Principal Problem:   Falls Active Problems:   HFrEF/Cardiomyopathy, ischemic-EF 20%   CAD/History of STEMI-Oct 2016- conservative Rx   PAFib/Atrial fibrillation   MDD (major depressive disorder), recurrent severe, without psychosis ([place])   Depression   Syncope and collapse  Fall at home/possible syncope -Unwitnessed fall at home, patient is poor historian, cannot provide any reliable history. -2D echocardiogram showed normal EF, carotid Dopplers negative -No significant events on telemetry -Orthostatic vital signs positive which is likely the cause of his fall/syncope -PT consult, recommends home health physical therapy - history of ischemic cardiomyopathy with EF  previously in the 20% range, but current echocardiogram shows improvement of EF to 55 to 60%  -  Traumatic Rhabdo -CK on admission was 1800, it had doubled to 3800 despite receiving IV fluids, and remains elevated.  -Change IV fluids to bicarb infusion CK is above 8119, metabolic acidosis noted --Change IV fluids of bicarb fluid  AKI - acute kidney injury due to dehydration,  baseline creatinine is 1, was  1.3 on admission . -Resolved  HFrEF/ischemic cardiomyopathy--EF was previously in the 20% range, current echo shows ejection fraction of 55 to 60%. - continue Coreg 6.25 mg twice daily, lisinopril on hold .  NeuroPsych -Family members are concerned about diminished cognitive abilities and memory deficits consistent with advanced progressive dementia  - decreased Seroquel to 200 mg nightly to avoid oversedation which may contribute to falls, continue Effexor 75 mg daily  Hypokalemia -Repleted, recheck in a.m.  Social- patient lives alone, son and daughter-in-law live just behind the house-several feet away.... they have a nanny camera at patient's house, patient has a paid caregiver Ms [name] who comes into the house from [place] from Mondays through Fridays---  physical therapy recommends home health physical therapy  Leukocytosis -Resolved, stress related from fall  COVID-19 Labs  No results for input(s): DDIMER, FERRITIN, LDH, CRP in the last 72 hours.  Lab Results  Component Value Date   [place] NEGATIVE 12/17/2018     Code Status : Full  Family Communication  : D/W son Edward Trevino 8/5  Disposition Plan  : Anticipate discharge home in the morning if CK level is trending down and orthostasis has resolved  Consults  :  none  Procedures  : Echo  1. The left ventricle has normal systolic function, with an ejection fraction of 55-60%. The cavity size was normal. Left ventricular diastolic Doppler parameters are consistent with impaired relaxation.  2. The right  ventricle has normal systolic function. The cavity was normal. There is no increase in right ventricular wall thickness.  3. No evidence of mitral valve stenosis.  4. The aortic valve has an indeterminate number of cusps. Aortic valve regurgitation is mild by color flow Doppler. Mild-moderate stenosis of the aortic valve.  5. The aorta is normal in size and structure.  6. The aortic root is normal in size and structure.  DVT Prophylaxis  : Heparin  Lab Results  Component Value Date   PLT 158 12/19/2018    Antibiotics  :    Anti-infectives (From admission, onward)   None        Objective:   Vitals:   12/20/18 0808 12/20/18 0812 12/20/18 0817 12/20/18 1421  BP: (!) 125/59 (!) 134/117 98/65 112/62  Pulse: 60 (!) 123 80 82  Resp:      Temp:    98.2 F (36.8 C)  TempSrc:    Oral  SpO2:    96%  Weight:      Height:        Wt Readings from Last 3 Encounters:  12/17/18 80 kg  02/20/18 79.8 kg  06/08/16 88.5 kg     Intake/Output Summary (Last 24 hours) at 12/20/2018 1902 Last data filed at 12/20/2018 1844 Gross per 24 hour  Intake 3978.86 ml  Output --  Net 3978.86 ml     Physical Exam  General exam: Alert, awake, no distress Respiratory system: Clear to auscultation. Respiratory effort normal. Cardiovascular system:RRR. No murmurs, rubs, gallops. Gastrointestinal system: Abdomen is nondistended, soft and nontender. No organomegaly or masses felt. Normal bowel sounds heard. Central nervous system: Generalized weakness, but no focal neurological deficits. Extremities: No C/C/E, +pedal pulses Skin: No rashes, lesions or ulcers Psychiatry: Somewhat forgetful,    Data Review:    CBC Recent Labs  Lab 12/17/18 1112 12/18/18 0602 12/19/18 0649  WBC 13.2* 7.7 7.3  HGB 13.4 10.8* 10.6*  HCT 40.5 32.7* 33.0*  PLT 223 160 158  MCV 97.4 98.2 100.3*  MCH 32.2  32.4 32.2  MCHC 33.1 33.0 32.1  RDW 13.3 13.3 13.2  LYMPHSABS 1.2  --   --   MONOABS 1.3*  --   --    EOSABS 0.0  --   --   BASOSABS 0.1  --   --     Chemistries  Recent Labs  Lab 12/17/18 1112 12/18/18 0602 12/19/18 0649 12/20/18 0504  NA 145 143 141 140  K 3.9 3.1* 3.7 3.8  CL 112* 115* 112* 114*  CO2 23 21* 22 20*  GLUCOSE 119* 102* 97 95  BUN 13 12 10 11   CREATININE 1.33* 0.99 0.95 1.12  CALCIUM 9.1 8.1* 8.4* 8.0*  AST 31  --   --   --   ALT 18  --   --   --   ALKPHOS 141*  --   --   --   BILITOT 0.6  --   --   --    ------------------------------------------------------------------------------------------------------------------ No results for input(s): CHOL, HDL, LDLCALC, TRIG, CHOLHDL, LDLDIRECT in the last 72 hours.  Lab Results  Component Value Date   HGBA1C  02/08/2010    5.6 (NOTE)                                                                       According to the ADA Clinical Practice Recommendations for 2011, when HbA1c is used as a screening test:   >=6.5%   Diagnostic of Diabetes Mellitus           (if abnormal result  is confirmed)  5.7-6.4%   Increased risk of developing Diabetes Mellitus  References:Diagnosis and Classification of Diabetes Mellitus,Diabetes Care,2011,34(Suppl 1):S62-S69 and Standards of Medical Care in         Diabetes - 2011,Diabetes BJSE,8315,17  (Suppl 1):S11-S61.   ------------------------------------------------------------------------------------------------------------------ No results for input(s): TSH, T4TOTAL, T3FREE, THYROIDAB in the last 72 hours.  Invalid input(s): FREET3 ------------------------------------------------------------------------------------------------------------------ No results for input(s): VITAMINB12, FOLATE, FERRITIN, TIBC, IRON, RETICCTPCT in the last 72 hours.  Coagulation profile No results for input(s): INR, PROTIME in the last 168 hours.  No results for input(s): DDIMER in the last 72 hours.  Cardiac Enzymes No results for input(s): CKMB, TROPONINI, MYOGLOBIN in the last 168  hours.  Invalid input(s): CK ------------------------------------------------------------------------------------------------------------------    Component Value Date/Time   BNP 232.0 (H) 05/20/2015 1210    Inpatient Medications  Scheduled Meds:  aspirin EC  81 mg Oral Daily   carvedilol  6.25 mg Oral BID WC   ferrous sulfate  325 mg Oral BID WC   heparin  5,000 Units Subcutaneous Q8H   megestrol  400 mg Oral Daily   pantoprazole  40 mg Oral BID AC   QUEtiapine  200 mg Oral QHS   sodium chloride flush  3 mL Intravenous Q12H   venlafaxine XR  75 mg Oral QHS   [START ON 12/21/2018] Vitamin D (Ergocalciferol)  50,000 Units Oral Weekly   Continuous Infusions:  sodium chloride      sodium bicarbonate  infusion 1000 mL 50 mL/hr at 12/20/18 1125   PRN Meds:.sodium chloride, acetaminophen **OR** acetaminophen, albuterol, hydrALAZINE, ondansetron **OR** ondansetron (ZOFRAN) IV, oxyCODONE, polyethylene glycol, sodium chloride flush  Micro Results Recent Results (from the past 240 hour(s))  SARS  Coronavirus 2 [facility] order, Performed in [facility] hospital lab) Nasopharyngeal Nasopharyngeal Swab     Status: None   Collection Time: 12/17/18  2:22 PM   Specimen: Nasopharyngeal Swab  Result Value Ref Range Status   SARS Coronavirus 2 NEGATIVE NEGATIVE Final    Comment: (NOTE) If result is NEGATIVE SARS-CoV-2 target nucleic acids are NOT DETECTED. The SARS-CoV-2 RNA is generally detectable in upper and lower  respiratory specimens during the acute phase of infection. The lowest  concentration of SARS-CoV-2 viral copies this assay can detect is 250  copies / mL. A negative result does not preclude SARS-CoV-2 infection  and should not be used as the sole basis for treatment or other  patient management decisions.  A negative result may occur with  improper specimen collection / handling, submission of specimen other  than nasopharyngeal swab, presence of viral mutation(s)  within the  areas targeted by this assay, and inadequate number of viral copies  (<250 copies / mL). A negative result must be combined with clinical  observations, patient history, and epidemiological information. If result is POSITIVE SARS-CoV-2 target nucleic acids are DETECTED. The SARS-CoV-2 RNA is generally detectable in upper and lower  respiratory specimens dur ing the acute phase of infection.  Positive  results are indicative of active infection with SARS-CoV-2.  Clinical  correlation with patient history and other diagnostic information is  necessary to determine patient infection status.  Positive results do  not rule out bacterial infection or co-infection with other viruses. If result is PRESUMPTIVE POSTIVE SARS-CoV-2 nucleic acids MAY BE PRESENT.   A presumptive positive result was obtained on the submitted specimen  and confirmed on repeat testing.  While 2019 novel coronavirus  (SARS-CoV-2) nucleic acids may be present in the submitted sample  additional confirmatory testing may be necessary for epidemiological  and / or clinical management purposes  to differentiate between  SARS-CoV-2 and other Sarbecovirus currently known to infect humans.  If clinically indicated additional testing with an alternate test  methodology [phone]) is advised. The SARS-CoV-2 RNA is generally  detectable in upper and lower respiratory sp ecimens during the acute  phase of infection. The expected result is Negative. Fact Sheet for Patients:  StrictlyIdeas.no Fact Sheet for Healthcare Providers: BankingDealers.co.za This test is not yet approved or cleared by the [place] FDA and has been authorized for detection and/or diagnosis of SARS-CoV-2 by FDA under an Emergency Use Authorization (EUA).  This EUA will remain in effect (meaning this test can be used) for the duration of the COVID-19 declaration under Section 564(b)(1) of the Act,  21 U.S.C. section 360bbb-3(b)(1), unless the authorization is terminated or revoked sooner. Performed at [facility], [address], [place], [place] [address]     Radiology Reports Ct Head Wo Contrast  Result Date: 12/17/2018 CLINICAL DATA:  [place] at home.  Found down. EXAM: CT HEAD WITHOUT CONTRAST CT CERVICAL SPINE WITHOUT CONTRAST TECHNIQUE: Multidetector CT imaging of the head and cervical spine was performed following the standard protocol without intravenous contrast. Multiplanar CT image reconstructions of the cervical spine were also generated. COMPARISON:  Head CT 07/22/2012 FINDINGS: CT HEAD FINDINGS Brain: Age related cerebral atrophy, ventriculomegaly and periventricular white matter disease. No acute intracranial findings such as hemispheric infarction or intracranial hemorrhage. No extra-axial fluid collections are identified. The brainstem and cerebellum appear normal. Vascular: Vascular calcifications but no definite aneurysm or hyperdense vessels. Skull: No skull fracture or bone lesions. Sinuses/Orbits: The paranasal sinuses and mastoid air cells  are clear. Other: No scalp hematoma or laceration. CT CERVICAL SPINE FINDINGS Alignment: Normal overall alignment. Skull base and vertebrae: Moderate degenerative changes involving the skull base C1 and C1-2. Moderate degenerative cervical spondylosis with multilevel disc disease and facet disease. No acute cervical spine fracture is identified. Soft tissues and spinal canal: No prevertebral fluid or swelling. No visible canal hematoma. Disc levels: The spinal canal is fairly generous. No significant spinal stenosis. No large disc protrusions. Mild multilevel foraminal narrowing due to uncinate spurring and facet disease. Upper chest: Biapical pleural and parenchymal scarring changes. Other: No neck mass or adenopathy. IMPRESSION: 1. Age related cerebral atrophy, ventriculomegaly and periventricular white matter disease. 2. No acute intracranial  findings or skull fracture. 3. Degenerative cervical spondylosis with multilevel disc disease and facet disease but no acute cervical spine fracture or canal stenosis. Electronically Signed   By: Marijo Sanes M.D.   On: 12/17/2018 13:20   Ct Cervical Spine Wo Contrast  Result Date: 12/17/2018 CLINICAL DATA:  [place] at home.  Found down. EXAM: CT HEAD WITHOUT CONTRAST CT CERVICAL SPINE WITHOUT CONTRAST TECHNIQUE: Multidetector CT imaging of the head and cervical spine was performed following the standard protocol without intravenous contrast. Multiplanar CT image reconstructions of the cervical spine were also generated. COMPARISON:  Head CT 07/22/2012 FINDINGS: CT HEAD FINDINGS Brain: Age related cerebral atrophy, ventriculomegaly and periventricular white matter disease. No acute intracranial findings such as hemispheric infarction or intracranial hemorrhage. No extra-axial fluid collections are identified. The brainstem and cerebellum appear normal. Vascular: Vascular calcifications but no definite aneurysm or hyperdense vessels. Skull: No skull fracture or bone lesions. Sinuses/Orbits: The paranasal sinuses and mastoid air cells are clear. Other: No scalp hematoma or laceration. CT CERVICAL SPINE FINDINGS Alignment: Normal overall alignment. Skull base and vertebrae: Moderate degenerative changes involving the skull base C1 and C1-2. Moderate degenerative cervical spondylosis with multilevel disc disease and facet disease. No acute cervical spine fracture is identified. Soft tissues and spinal canal: No prevertebral fluid or swelling. No visible canal hematoma. Disc levels: The spinal canal is fairly generous. No significant spinal stenosis. No large disc protrusions. Mild multilevel foraminal narrowing due to uncinate spurring and facet disease. Upper chest: Biapical pleural and parenchymal scarring changes. Other: No neck mass or adenopathy. IMPRESSION: 1. Age related cerebral atrophy, ventriculomegaly and  periventricular white matter disease. 2. No acute intracranial findings or skull fracture. 3. Degenerative cervical spondylosis with multilevel disc disease and facet disease but no acute cervical spine fracture or canal stenosis. Electronically Signed   By: Marijo Sanes M.D.   On: 12/17/2018 13:20   US Carotid Bilateral  Result Date: 12/18/2018 CLINICAL DATA:  85 year old male with a history of syncope EXAM: BILATERAL CAROTID DUPLEX ULTRASOUND TECHNIQUE: Pearline Cables scale imaging, color Doppler and duplex ultrasound were performed of bilateral carotid and vertebral arteries in the neck. COMPARISON:  None. FINDINGS: Criteria: Quantification of carotid stenosis is based on velocity parameters that correlate the residual internal carotid diameter with NASCET-based stenosis levels, using the diameter of the distal internal carotid lumen as the denominator for stenosis measurement. The following velocity measurements were obtained: RIGHT ICA:  Systolic 70 cm/sec, Diastolic 18 cm/sec CCA:  58 cm/sec SYSTOLIC ICA/CCA RATIO:  1.2 ECA:  34 cm/sec LEFT ICA:  Systolic 74 cm/sec, Diastolic 18 cm/sec CCA:  69 cm/sec SYSTOLIC ICA/CCA RATIO:  81.0 ECA:  52 cm/sec Right Brachial SBP: Not acquired Left Brachial SBP: Not acquired RIGHT CAROTID ARTERY: No significant calcified disease of the right common carotid  artery. Intermediate waveform maintained. Heterogeneous plaque without significant calcifications at the right carotid bifurcation. Low resistance waveform of the right ICA. No significant tortuosity. RIGHT VERTEBRAL ARTERY: Antegrade flow with low resistance waveform. LEFT CAROTID ARTERY: No significant calcified disease of the left common carotid artery. Intermediate waveform maintained. Heterogeneous plaque at the left carotid bifurcation without significant calcifications. Low resistance waveform of the left ICA. LEFT VERTEBRAL ARTERY:  Antegrade flow with low resistance waveform. IMPRESSION: Color duplex indicates minimal  heterogeneous plaque, with no hemodynamically significant stenosis by duplex criteria in the extracranial cerebrovascular circulation. Signed, Dulcy Fanny. Dellia Nims, RPVI [facility] Radiology Electronically Signed   By: Corrie Mckusick D.O.   On: 12/18/2018 11:48   Dg Chest Portable 1 View  Result Date: 12/17/2018 CLINICAL DATA:  Hematemesis. The patient fell today. EXAM: PORTABLE CHEST 1 VIEW COMPARISON:  Chest x-ray dated 05/03/2015 FINDINGS: The heart size and mediastinal contours are within normal limits. Both lungs are clear except for small chronic calcified granulomas at the right lung base. The visualized skeletal structures are unremarkable. IMPRESSION: 1. No active disease. 2. No acute rib fracture or bone destruction. Electronically Signed   By: Lorriane Shire M.D.   On: 12/17/2018 12:07    Roxan Hockey M.D on 12/20/2018 at 7:02 PM  After 7pm go to [email or web address]   [facility]

## 2018-12-21 DIAGNOSIS — R55 Syncope and collapse: Principal | ICD-10-CM

## 2018-12-21 LAB — BASIC METABOLIC PANEL
Anion gap: 7 (ref 5–15)
BUN: 9 mg/dL (ref 8–23)
CO2: 24 mmol/L (ref 22–32)
Calcium: 8.4 mg/dL — ABNORMAL LOW (ref 8.9–10.3)
Chloride: 110 mmol/L (ref 98–111)
Creatinine, Ser: 1.13 mg/dL (ref 0.61–1.24)
GFR calc Af Amer: >60 mL/min (ref >60)
GFR calc non Af Amer: 59 mL/min — ABNORMAL LOW (ref >60)
Glucose, Bld: 105 mg/dL — ABNORMAL HIGH (ref 70–99)
Potassium: 3.4 mmol/L — ABNORMAL LOW (ref 3.5–5.1)
Sodium: 141 mmol/L (ref 135–145)

## 2018-12-21 LAB — CK: Total CK: 2215 U/L — ABNORMAL HIGH (ref 49–397)

## 2018-12-21 MED ORDER — ACETAMINOPHEN 325 MG PO TABS: 650.0000 mg | ORAL_TABLET | Freq: Four times a day (QID) | ORAL | 0 refills | Status: DC | PRN

## 2018-12-21 MED ORDER — POTASSIUM CHLORIDE CRYS ER 20 MEQ PO TBCR
40.0000 meq | EXTENDED_RELEASE_TABLET | Freq: Once | ORAL | Status: AC
  Administered 2018-12-21: 40 meq via ORAL
  Filled 2018-12-21: qty 2

## 2018-12-21 MED ORDER — MEGESTROL ACETATE 400 MG/10ML PO SUSP: 400.0000 mg | Freq: Every day | ORAL | 2 refills | Status: DC

## 2018-12-21 MED ORDER — PANTOPRAZOLE SODIUM 40 MG PO TBEC: 40.0000 mg | DELAYED_RELEASE_TABLET | Freq: Every day | ORAL | 2 refills | Status: DC

## 2018-12-21 MED ORDER — CARVEDILOL 3.125 MG PO TABS: 3.1250 mg | ORAL_TABLET | Freq: Two times a day (BID) | ORAL | 3 refills | Status: AC

## 2018-12-21 MED ORDER — VENLAFAXINE HCL ER 75 MG PO TB24: 75.0000 mg | ORAL_TABLET | Freq: Every day | ORAL | 2 refills | Status: DC

## 2018-12-21 MED ORDER — VITAMIN D (ERGOCALCIFEROL) 1.25 MG (50000 UNIT) PO CAPS: 50000.0000 [IU] | ORAL_CAPSULE | ORAL | 2 refills | Status: DC

## 2018-12-21 MED ORDER — FERROUS SULFATE 325 (65 FE) MG PO TBEC: 1.0000 | DELAYED_RELEASE_TABLET | Freq: Two times a day (BID) | ORAL | 3 refills | Status: DC

## 2018-12-21 MED ORDER — QUETIAPINE FUMARATE 100 MG PO TABS: 100.0000 mg | ORAL_TABLET | Freq: Every day | ORAL | 3 refills | Status: DC

## 2018-12-21 MED ORDER — ASPIRIN EC 81 MG PO TBEC: 81.0000 mg | DELAYED_RELEASE_TABLET | Freq: Every day | ORAL | 2 refills | Status: DC

## 2018-12-21 NOTE — Plan of Care (Signed)
Attempted to call the son, Konrad Dolores, to update on the status of his fathers rediness for discharge. He did not answer, a message was left on cel phone.

## 2018-12-21 NOTE — Progress Notes (Signed)
IV removed and discharge instructions in packet for son.  Son contacted by phone and will be here at 1600

## 2018-12-21 NOTE — Discharge Summary (Signed)
Edward Trevino, is a 85 y.o. male  DOB 26-Feb-1932  MRN [id number].  Admission date:  12/17/2018  Admitting Physician  Roxan Hockey, MD  Discharge Date:  12/21/2018   Primary MD  [name], MD  Recommendations for primary care physician for things to follow:   1) repeat BMP, CBC and total CK blood test with PCP within a week- 2) hold Lipitor/atorvastatin until CKs normalized and then may restart at a lower dose 3) please note that the blood pressure medications have been adjusted to reduce the chances of low blood pressure and falls 4) your Seroquel has been decreased to 100 mg at bedtime to reduce your risk for oversedation that may contribute to falls   Admission Diagnosis  Fall   Discharge Diagnosis  Fall    Principal Problem:   Falls Active Problems:   HFrEF/Cardiomyopathy, ischemic-EF 20%   CAD/History of STEMI-Oct 2016- conservative Rx   PAFib/Atrial fibrillation   MDD (major depressive disorder), recurrent severe, without psychosis ([place])   Depression   Syncope and collapse      Past Medical History:  Diagnosis Date   Anxiety    Bladder stones 05/22/2015   Choledocholithiasis with obstruction 07/25/2012   Depression    Duodenitis 05/11/15   Erosive esophagitis    Esophageal stricture 05/11/15   Dilated   Gastric out let obstruction    Gastric outlet obstruction 05/07/2015   HOH (hard of hearing)    Insomnia    Ischemic cardiomyopathy    LVEF 20%   Multiple duodenal ulcers 05/11/15   2  were present per EGD.   ST elevation myocardial infarction (STEMI) of anterior wall [facility])    October 2016 - late presentation, managed conservatively   Suicide attempt [facility])     Past Surgical History:  Procedure Laterality Date   APPENDECTOMY     BACK SURGERY     BALLOON DILATION N/A 07/24/2012   Procedure: BALLOON DILATION;  Surgeon: Daneil Dolin, MD;  Location: [facility]  ORS;  Service: Endoscopy;  Laterality: N/A;  Balloon Stone Extraction, Drudging   BIOPSY N/A 07/24/2012   Procedure: BIOPSY;  Surgeon: Daneil Dolin, MD;  Location: [facility] ORS;  Service: Endoscopy;  Laterality: N/A;  Duodenal and Esophageal Biopsies   BIOPSY N/A 10/18/2012   Procedure: BIOPSY;  Surgeon: Daneil Dolin, MD;  Location: [facility] ORS;  Service: Endoscopy;  Laterality: N/A;   CHOLECYSTECTOMY N/A 10/26/2012   Procedure: LAPAROSCOPIC CHOLECYSTECTOMY;  Surgeon: Jamesetta So, MD;  Location: [facility] ORS;  Service: General;  Laterality: N/A;   EGD/ERCP  10/18/2012   Dr. Gala Romney: ;yloric channel stenosis, s/p dilation and bx, persisting CBD stones with extension of sphincterotomy and sphincterotomy balloon dilation and stone extraction. Removal of biliary stent   ERCP N/A 07/24/2012   Dr. Gala Romney. Significant abnormalities of the bowl and proximal second portion producing partial gastric outlet stricture and with secondary gastric dilation and severe erosive reflux esophagitis. Biopsy showed benign ulceration. Normal-appearing ampulla, status post biliary sphincterotomy and ampulla balloon dilation,  stone extraction and stent placement.   ERCP N/A 10/18/2012   Procedure: ENDOSCOPIC RETROGRADE CHOLANGIOPANCREATOGRAPHY (ERCP);  Surgeon: Daneil Dolin, MD;  Location: [facility] ORS;  Service: Endoscopy;  Laterality: N/A;   ESOPHAGEAL DILATION N/A 05/15/2015   Procedure: ESOPHAGEAL DILATION;  Surgeon: Danie Binder, MD;  Location: [facility] ENDO SUITE;  Service: Endoscopy;  Laterality: N/A;   ESOPHAGOGASTRODUODENOSCOPY N/A 07/24/2012   YYQ:MGNOIB-[id number] ampulla s/p biliary sphincterotomy and ampullary   ESOPHAGOGASTRODUODENOSCOPY N/A 05/07/2015   RMR: Severe esophagitis, retained food in the stomach precluded complete exam, pyloric channel/duodenal bulb abnormal with 1 cm ulcer, friability with high-grade gastric outlet obstruction. No dilation performed on Plavix and aspirin.   ESOPHAGOGASTRODUODENOSCOPY N/A 05/11/2015     Rehman: Distal esophageal ulcers with circumferential ulceration at distal 3-4 cm, soft stricture dilated with scope, diffuse erythema and edema in the bulbar mucosa with 2 small ulcers in the distal segment and high-grade stricture in the middle, scope could not be passed. Dilated up to 12 mm. Could not see duodenum beyond this point because the scope was retained in the large stomach.   ESOPHAGOGASTRODUODENOSCOPY N/A 05/15/2015   SLF: Esophagitis with stricture, dilated GE junction stricture up to 14 mm with the balloon, D1/D2 stricture with narrowing of the lumen to 10-11 mm, dilated up to 15 mm with balloon. Downstream duodenum appear normal. Small duodenal bulbar ulcer. Biopsies from the stomach benign gastritis, no H pylori.   ESOPHAGOGASTRODUODENOSCOPY (EGD) WITH PROPOFOL N/A 10/18/2012   XIH:WTUUEKC channel stenosis s/p dilation/Persisting common bile duct stones with extension of sphincterotomy     and sphincterotomy balloon dilation and stone extraction.  Biliary stent removal    LAPAROSCOPY N/A 10/31/2012   Procedure: LAPAROSCOPY DIAGNOSTIC;  Surgeon: Donato Heinz, MD;  Location: [facility] ORS;  Service: General;  Laterality: N/A;   REMOVAL OF STONES N/A 07/24/2012   Procedure: REMOVAL OF STONES;  Surgeon: Daneil Dolin, MD;  Location: [facility] ORS;  Service: Endoscopy;  Laterality: N/A;   REMOVAL OF STONES N/A 10/18/2012   Procedure: REMOVAL OF STONES;  Surgeon: Daneil Dolin, MD;  Location: [facility] ORS;  Service: Endoscopy;  Laterality: N/A;   SPHINCTEROTOMY N/A 07/24/2012   Procedure: SPHINCTEROTOMY;  Surgeon: Daneil Dolin, MD;  Location: [facility] ORS;  Service: Endoscopy;  Laterality: N/A;       HPI  from the history and physical done on the day of admission:   Edward Trevino  is a 85 y.o. male past medical history relevant for HFrEF/ischemic cardiomyopathy with EF in the 20% range,, h/o CAD with prior  STEMI 2016 w/ med rx, GERD,  depression, HOH and history of erosive esophagitis/duodenitis/gastric  and duodenal ulcers as well as history of parietal GE junction stricture with subsequent dilatation presents to the ED after being found on the floor at home with concerns about possible ??  Coffee-ground emesis on his clothing  --Patient is a poor historian  I called and obtained history from patient's daughter-in-law and son (Carlene and Purcell Jungbluth)-  -Apparently patient lives alone, son and daughter-in-law live just behind the house-several feet away.... they have a nanny camera at patient's house -6 AM patient was in bed ax per daughter-in-law --Around 8 AM patient was seen at the dinner table via the Nanny camera--- -patient has a paid caregiver Ms [name] who comes into the house from [place] from Mondays through Fridays--she found him on the floor in 1 of the bedroom just before 9 AM  --So patient was probably down for no more  than 45 minutes or so  --Apparently patient has been increasingly more forgetful consistent with his dementia  -No fevers, no chills, may have had episode of emesis x1, no diarrhea  In ED cxr,, CT head and CT C-spine without acute findings -Creatinine is up to 1.3 from a baseline of 1.0, LFTs are not elevated except for alk phos of 141 -Highly sensitive troponin is flat at 53, repeat 52 --WBC is elevated at 13.2 H&H is 13.4 and 40.5 with a platelet count of 223 -UA is not suggestive of UTI -Serum ammonia is 15 -Fecal occult blood is negative and COVID-19 test is negative -CK is elevated at [facility] Course:     Fall at home/possible syncope -Unwitnessed fall at home, patient is poor historian, cannot provide any reliable history. -2D echocardiogram showed normal EF, carotid Dopplers negative -No significant events on telemetry -Orthostatic vital signs positive which is likely the cause of his fall/syncope -PT consult, recommends home health physical therapy - history of ischemic cardiomyopathy with EF previously in the 20% range, but  current echocardiogram shows improvement of EF to 55 to 60%  --Gait and orthostasis have improved, discharged home with home health  Traumatic Rhabdo -CK on admission was 1800,, peaked at over 3800, overall improved with IV fluids, -CK trending down  AKI - acute kidney injurydue to dehydration, baseline creatinine is 1, was 1.3 on admission . -Resolved  HFrEF/ischemic cardiomyopathy--EF was previously in the 20% range, current echo shows ejection fraction of 55 to 60%. - continue Coreg 6.25 mg twice daily,    NeuroPsych -Family members are concerned about diminished cognitive abilities and memory deficits consistent with advanced progressive dementia - decreased Seroquel to 100 mg nightly to avoid oversedation which may contribute to falls, continue Effexor 75 mg daily   Social-patient lives alone, son and daughter-in-law livejust behind the house-several feet away....they have a nanny cameraat patient's house,patient has a paid caregiverMs Wendywho comes into the house from 9am-3pmfrom Mondays through Fridays--- physical therapy recommends home health physical therapy  Leukocytosis -Resolved, stress related from fall  Discharge Condition: stable  Follow UP  Follow-up Information    [facility] Follow [facility].   Specialty: Home Health Services Why: RN and PT             Diet and Activity recommendation:  As advised  Discharge Instructions    Discharge Instructions    Call MD for:  difficulty breathing, headache or visual disturbances   Complete by: As directed    Call MD for:  persistant dizziness or light-headedness   Complete by: As directed    Call MD for:  persistant nausea and vomiting   Complete by: As directed    Call MD for:  severe uncontrolled pain   Complete by: As directed    Call MD for:  temperature >100.4   Complete by: As directed    Diet - low sodium heart healthy   Complete by: As directed    Discharge  instructions   Complete by: As directed    1) repeat BMP, CBC and total CK blood test with PCP within a week- 2) hold Lipitor/atorvastatin until CKs normalized and then may restart at a lower dose 3) please note that the blood pressure medications have been adjusted to reduce the chances of low blood pressure and falls 4) your Seroquel has been decreased to 100 mg at bedtime to reduce your risk for oversedation that may contribute to falls  Increase activity slowly   Complete by: As directed         Discharge Medications     Allergies as of 12/21/2018   No Known Allergies     Medication List    STOP taking these medications   atorvastatin 80 MG tablet Commonly known as: LIPITOR   lisinopril 2.5 MG tablet Commonly known as: ZESTRIL     TAKE these medications   acetaminophen 325 MG tablet Commonly known as: TYLENOL Take 2 tablets (650 mg total) by mouth every 6 (six) hours as needed for mild pain, fever or headache (or Fever >/= 101).   aspirin EC 81 MG tablet Take 1 tablet (81 mg total) by mouth daily with breakfast. What changed: when to take this   carvedilol 3.125 MG tablet Commonly known as: COREG Take 1 tablet (3.125 mg total) by mouth 2 (two) times daily with a meal. What changed: Another medication with the same name was removed. Continue taking this medication, and follow the directions you see here.   ferrous sulfate 325 (65 FE) MG EC tablet Take 1 tablet (325 mg total) by mouth 2 (two) times daily.   megestrol 400 MG/10ML suspension Commonly known as: MEGACE Take 10 mLs (400 mg total) by mouth daily. Start taking on: December 22, 2018   nitroGLYCERIN 0.4 mg/hr patch Commonly known as: NITRODUR - Dosed in mg/24 hr APPLY ONE PATCH TOPICALLY ONCE DAILY What changed: See the new instructions.   pantoprazole 40 MG tablet Commonly known as: Protonix Take 1 tablet (40 mg total) by mouth daily.   QUEtiapine 100 MG tablet Commonly known as: SEROQUEL Take 1  tablet (100 mg total) by mouth at bedtime. What changed: how much to take   Venlafaxine HCl 75 MG Tb24 Take 1 tablet (75 mg total) by mouth at bedtime.   Vitamin D (Ergocalciferol) 1.25 MG (50000 UT) Caps capsule Commonly known as: DRISDOL Take 1 capsule (50,000 Units total) by mouth every Sunday. Start taking on: December 23, 2018 What changed: when to take this       Major procedures and Radiology Reports - PLEASE review detailed and final reports for all details, in brief -   Ct Head Wo Contrast  Result Date: 12/17/2018 CLINICAL DATA:  [place] at home.  Found down. EXAM: CT HEAD WITHOUT CONTRAST CT CERVICAL SPINE WITHOUT CONTRAST TECHNIQUE: Multidetector CT imaging of the head and cervical spine was performed following the standard protocol without intravenous contrast. Multiplanar CT image reconstructions of the cervical spine were also generated. COMPARISON:  Head CT 07/22/2012 FINDINGS: CT HEAD FINDINGS Brain: Age related cerebral atrophy, ventriculomegaly and periventricular white matter disease. No acute intracranial findings such as hemispheric infarction or intracranial hemorrhage. No extra-axial fluid collections are identified. The brainstem and cerebellum appear normal. Vascular: Vascular calcifications but no definite aneurysm or hyperdense vessels. Skull: No skull fracture or bone lesions. Sinuses/Orbits: The paranasal sinuses and mastoid air cells are clear. Other: No scalp hematoma or laceration. CT CERVICAL SPINE FINDINGS Alignment: Normal overall alignment. Skull base and vertebrae: Moderate degenerative changes involving the skull base C1 and C1-2. Moderate degenerative cervical spondylosis with multilevel disc disease and facet disease. No acute cervical spine fracture is identified. Soft tissues and spinal canal: No prevertebral fluid or swelling. No visible canal hematoma. Disc levels: The spinal canal is fairly generous. No significant spinal stenosis. No large disc protrusions.  Mild multilevel foraminal narrowing due to uncinate spurring and facet disease. Upper chest: Biapical pleural and parenchymal scarring changes. Other:  No neck mass or adenopathy. IMPRESSION: 1. Age related cerebral atrophy, ventriculomegaly and periventricular white matter disease. 2. No acute intracranial findings or skull fracture. 3. Degenerative cervical spondylosis with multilevel disc disease and facet disease but no acute cervical spine fracture or canal stenosis. Electronically Signed   By: Marijo Sanes M.D.   On: 12/17/2018 13:20   Ct Cervical Spine Wo Contrast  Result Date: 12/17/2018 CLINICAL DATA:  [place] at home.  Found down. EXAM: CT HEAD WITHOUT CONTRAST CT CERVICAL SPINE WITHOUT CONTRAST TECHNIQUE: Multidetector CT imaging of the head and cervical spine was performed following the standard protocol without intravenous contrast. Multiplanar CT image reconstructions of the cervical spine were also generated. COMPARISON:  Head CT 07/22/2012 FINDINGS: CT HEAD FINDINGS Brain: Age related cerebral atrophy, ventriculomegaly and periventricular white matter disease. No acute intracranial findings such as hemispheric infarction or intracranial hemorrhage. No extra-axial fluid collections are identified. The brainstem and cerebellum appear normal. Vascular: Vascular calcifications but no definite aneurysm or hyperdense vessels. Skull: No skull fracture or bone lesions. Sinuses/Orbits: The paranasal sinuses and mastoid air cells are clear. Other: No scalp hematoma or laceration. CT CERVICAL SPINE FINDINGS Alignment: Normal overall alignment. Skull base and vertebrae: Moderate degenerative changes involving the skull base C1 and C1-2. Moderate degenerative cervical spondylosis with multilevel disc disease and facet disease. No acute cervical spine fracture is identified. Soft tissues and spinal canal: No prevertebral fluid or swelling. No visible canal hematoma. Disc levels: The spinal canal is fairly  generous. No significant spinal stenosis. No large disc protrusions. Mild multilevel foraminal narrowing due to uncinate spurring and facet disease. Upper chest: Biapical pleural and parenchymal scarring changes. Other: No neck mass or adenopathy. IMPRESSION: 1. Age related cerebral atrophy, ventriculomegaly and periventricular white matter disease. 2. No acute intracranial findings or skull fracture. 3. Degenerative cervical spondylosis with multilevel disc disease and facet disease but no acute cervical spine fracture or canal stenosis. Electronically Signed   By: Marijo Sanes M.D.   On: 12/17/2018 13:20   US Carotid Bilateral  Result Date: 12/18/2018 CLINICAL DATA:  85 year old male with a history of syncope EXAM: BILATERAL CAROTID DUPLEX ULTRASOUND TECHNIQUE: Pearline Cables scale imaging, color Doppler and duplex ultrasound were performed of bilateral carotid and vertebral arteries in the neck. COMPARISON:  None. FINDINGS: Criteria: Quantification of carotid stenosis is based on velocity parameters that correlate the residual internal carotid diameter with NASCET-based stenosis levels, using the diameter of the distal internal carotid lumen as the denominator for stenosis measurement. The following velocity measurements were obtained: RIGHT ICA:  Systolic 70 cm/sec, Diastolic 18 cm/sec CCA:  58 cm/sec SYSTOLIC ICA/CCA RATIO:  1.2 ECA:  34 cm/sec LEFT ICA:  Systolic 74 cm/sec, Diastolic 18 cm/sec CCA:  69 cm/sec SYSTOLIC ICA/CCA RATIO:  04.8 ECA:  52 cm/sec Right Brachial SBP: Not acquired Left Brachial SBP: Not acquired RIGHT CAROTID ARTERY: No significant calcified disease of the right common carotid artery. Intermediate waveform maintained. Heterogeneous plaque without significant calcifications at the right carotid bifurcation. Low resistance waveform of the right ICA. No significant tortuosity. RIGHT VERTEBRAL ARTERY: Antegrade flow with low resistance waveform. LEFT CAROTID ARTERY: No significant calcified  disease of the left common carotid artery. Intermediate waveform maintained. Heterogeneous plaque at the left carotid bifurcation without significant calcifications. Low resistance waveform of the left ICA. LEFT VERTEBRAL ARTERY:  Antegrade flow with low resistance waveform. IMPRESSION: Color duplex indicates minimal heterogeneous plaque, with no hemodynamically significant stenosis by duplex criteria in the extracranial cerebrovascular circulation. Signed, York Cerise  Pasty Arch, DO, RPVI [facility] Electronically Signed   By: Corrie Mckusick D.O.   On: 12/18/2018 11:48   Dg Chest Portable 1 View  Result Date: 12/17/2018 CLINICAL DATA:  Hematemesis. The patient fell today. EXAM: PORTABLE CHEST 1 VIEW COMPARISON:  Chest x-ray dated 05/03/2015 FINDINGS: The heart size and mediastinal contours are within normal limits. Both lungs are clear except for small chronic calcified granulomas at the right lung base. The visualized skeletal structures are unremarkable. IMPRESSION: 1. No active disease. 2. No acute rib fracture or bone destruction. Electronically Signed   By: Lorriane Shire M.D.   On: 12/17/2018 12:07    Micro Results   Recent Results (from the past 240 hour(s))  SARS Coronavirus 2 [facility] order, Performed in [facility] hospital lab) Nasopharyngeal Nasopharyngeal Swab     Status: None   Collection Time: 12/17/18  2:22 PM   Specimen: Nasopharyngeal Swab  Result Value Ref Range Status   SARS Coronavirus 2 NEGATIVE NEGATIVE Final    Comment: (NOTE) If result is NEGATIVE SARS-CoV-2 target nucleic acids are NOT DETECTED. The SARS-CoV-2 RNA is generally detectable in upper and lower  respiratory specimens during the acute phase of infection. The lowest  concentration of SARS-CoV-2 viral copies this assay can detect is 250  copies / mL. A negative result does not preclude SARS-CoV-2 infection  and should not be used as the sole basis for  treatment or other  patient management decisions.  A negative result may occur with  improper specimen collection / handling, submission of specimen other  than nasopharyngeal swab, presence of viral mutation(s) within the  areas targeted by this assay, and inadequate number of viral copies  (<250 copies / mL). A negative result must be combined with clinical  observations, patient history, and epidemiological information. If result is POSITIVE SARS-CoV-2 target nucleic acids are DETECTED. The SARS-CoV-2 RNA is generally detectable in upper and lower  respiratory specimens dur ing the acute phase of infection.  Positive  results are indicative of active infection with SARS-CoV-2.  Clinical  correlation with patient history and other diagnostic information is  necessary to determine patient infection status.  Positive results do  not rule out bacterial infection or co-infection with other viruses. If result is PRESUMPTIVE POSTIVE SARS-CoV-2 nucleic acids MAY BE PRESENT.   A presumptive positive result was obtained on the submitted specimen  and confirmed on repeat testing.  While 2019 novel coronavirus  (SARS-CoV-2) nucleic acids may be present in the submitted sample  additional confirmatory testing may be necessary for epidemiological  and / or clinical management purposes  to differentiate between  SARS-CoV-2 and other Sarbecovirus currently known to infect humans.  If clinically indicated additional testing with an alternate test  methodology [phone]) is advised. The SARS-CoV-2 RNA is generally  detectable in upper and lower respiratory sp ecimens during the acute  phase of infection. The expected result is Negative. Fact Sheet for Patients:  StrictlyIdeas.no Fact Sheet for Healthcare Providers: BankingDealers.co.za This test is not yet approved or cleared by the [place] FDA and has been authorized for detection and/or  diagnosis of SARS-CoV-2 by FDA under an Emergency Use Authorization (EUA).  This EUA will remain in effect (meaning this test can be used) for the duration of the COVID-19 declaration under Section 564(b)(1) of the Act, 21 U.S.C. section 360bbb-3(b)(1), unless the authorization is terminated or revoked sooner. Performed at [facility], [address] [place], [place] [address]  Today   Subjective    Edward Trevino today has no new concerns, voiding okay          Patient has been seen and examined prior to discharge   Objective   Blood pressure 112/61, pulse 66, temperature 98 F (36.7 C), temperature source Oral, resp. rate 18, height '6\' 2"'  (1.88 m), weight 80 kg, SpO2 99 %.   Intake/Output Summary (Last 24 hours) at 12/21/2018 1059 Last data filed at 12/21/2018 0900 Gross per 24 hour  Intake 2791.59 ml  Output 250 ml  Net 2541.59 ml    Exam Gen:- Awake Alert, no acute distress  HEENT:- [place].AT, No sclera icterus Neck-Supple Neck,No JVD,.  Lungs-  CTAB , good air movement bilaterally  CV- S1, S2 normal, regular Abd-  +ve B.Sounds, Abd Soft, No tenderness,    Extremity/Skin:- No  edema,   good pulses Psych-forgetful with some mild cognitive deficits,  neuro-generalized weakness, but no new focal deficits, no tremors    Data Review   CBC w Diff:  Lab Results  Component Value Date   WBC 7.3 12/19/2018   HGB 10.6 (L) 12/19/2018   HGB 13.5 10/05/2012   HCT 33.0 (L) 12/19/2018   HCT 40 10/05/2012   PLT 158 12/19/2018   LYMPHOPCT 9 12/17/2018   MONOPCT 10 12/17/2018   EOSPCT 0 12/17/2018   BASOPCT 1 12/17/2018    CMP:  Lab Results  Component Value Date   NA 141 12/21/2018   NA 143 10/20/2015   K 3.4 (L) 12/21/2018   K 3.7 10/05/2012   CL 110 12/21/2018   CO2 24 12/21/2018   BUN 9 12/21/2018   BUN 12 10/20/2015   CREATININE 1.13 12/21/2018   CREATININE 1.28 (H) 09/10/2015   PROT 7.4 12/17/2018   ALBUMIN 4.1 12/17/2018   ALBUMIN 4.0 10/05/2012    BILITOT 0.6 12/17/2018   BILITOT 0.3 10/05/2012   ALKPHOS 141 (H) 12/17/2018   ALKPHOS 97 10/05/2012   AST 31 12/17/2018   AST 23 10/05/2012   ALT 18 12/17/2018  .   Total Discharge time is about 33 minutes  Roxan Hockey M.D on 12/21/2018 at 10:59 AM  Go to [email or web address] -  for contact info  [facility] - Office  [phone]

## 2018-12-21 NOTE — Care Management Important Message (Signed)
Important Message  Patient Details  Name: Edward Trevino MRN: [id number] Date of Birth: 10-14-1931   Medicare Important Message Given:  Yes     Tommy Medal 12/21/2018, 1:08 PM

## 2018-12-21 NOTE — Clinical Social Work Note (Signed)
Linda at [facility] advised of patient's discharge and [place] orders.  LCSW signing off.      Estrella Alcaraz, Clydene Pugh, LCSW

## 2018-12-21 NOTE — Discharge Instructions (Signed)
1)Repeat BMP, CBC and total CK blood test with PCP within a week- 2)Hold Lipitor/atorvastatin until CKs normalized and then may restart at a lower dose 3)Please note that the blood pressure medications have been adjusted to reduce the chances of low blood pressure and falls 4)Your Seroquel has been decreased to 100 mg at bedtime to reduce your risk for oversedation that may contribute to falls

## 2019-03-28 ENCOUNTER — Emergency Department (HOSPITAL_COMMUNITY): Payer: Medicare Other

## 2019-03-28 ENCOUNTER — Emergency Department (HOSPITAL_COMMUNITY)
Admission: EM | Admit: 2019-03-28 | Discharge: 2019-03-31 | Disposition: A | Discharge status: Home / Self Care | Payer: Medicare Other | Attending: Emergency Medicine | Admitting: Emergency Medicine

## 2019-03-28 ENCOUNTER — Other Ambulatory Visit: Payer: Self-pay

## 2019-03-28 ENCOUNTER — Encounter (HOSPITAL_COMMUNITY): Payer: Self-pay

## 2019-03-28 DIAGNOSIS — Z7982 Long term (current) use of aspirin: Secondary | ICD-10-CM | POA: Diagnosis not present

## 2019-03-28 DIAGNOSIS — Z20828 Contact with and (suspected) exposure to other viral communicable diseases: Secondary | ICD-10-CM | POA: Insufficient documentation

## 2019-03-28 DIAGNOSIS — I480 Paroxysmal atrial fibrillation: Secondary | ICD-10-CM | POA: Diagnosis not present

## 2019-03-28 DIAGNOSIS — F17210 Nicotine dependence, cigarettes, uncomplicated: Secondary | ICD-10-CM | POA: Insufficient documentation

## 2019-03-28 DIAGNOSIS — F332 Major depressive disorder, recurrent severe without psychotic features: Secondary | ICD-10-CM | POA: Diagnosis not present

## 2019-03-28 DIAGNOSIS — F32A Depression, unspecified: Secondary | ICD-10-CM

## 2019-03-28 DIAGNOSIS — I259 Chronic ischemic heart disease, unspecified: Secondary | ICD-10-CM | POA: Insufficient documentation

## 2019-03-28 DIAGNOSIS — I5022 Chronic systolic (congestive) heart failure: Secondary | ICD-10-CM | POA: Insufficient documentation

## 2019-03-28 DIAGNOSIS — F329 Major depressive disorder, single episode, unspecified: Secondary | ICD-10-CM

## 2019-03-28 DIAGNOSIS — R4182 Altered mental status, unspecified: Secondary | ICD-10-CM | POA: Diagnosis present

## 2019-03-28 DIAGNOSIS — Z79899 Other long term (current) drug therapy: Secondary | ICD-10-CM | POA: Insufficient documentation

## 2019-03-28 LAB — CBC WITH DIFFERENTIAL/PLATELET
Abs Immature Granulocytes: 0.05 10*3/uL (ref 0.00–0.07)
Basophils Absolute: 0 10*3/uL (ref 0.0–0.1)
Basophils Relative: 1 %
Eosinophils Absolute: 0.1 10*3/uL (ref 0.0–0.5)
Eosinophils Relative: 1 %
HCT: 37.9 % — ABNORMAL LOW (ref 39.0–52.0)
Hemoglobin: 12.7 g/dL — ABNORMAL LOW (ref 13.0–17.0)
Immature Granulocytes: 1 %
Lymphocytes Relative: 16 %
Lymphs Abs: 1.1 10*3/uL (ref 0.7–4.0)
MCH: 33.6 pg (ref 26.0–34.0)
MCHC: 33.5 g/dL (ref 30.0–36.0)
MCV: 100.3 fL — ABNORMAL HIGH (ref 80.0–100.0)
Monocytes Absolute: 0.7 10*3/uL (ref 0.1–1.0)
Monocytes Relative: 10 %
Neutro Abs: 5 10*3/uL (ref 1.7–7.7)
Neutrophils Relative %: 71 %
Platelets: 191 10*3/uL (ref 150–400)
RBC: 3.78 MIL/uL — ABNORMAL LOW (ref 4.22–5.81)
RDW: 12.8 % (ref 11.5–15.5)
WBC: 6.9 10*3/uL (ref 4.0–10.5)
nRBC: 0 % (ref 0.0–0.2)

## 2019-03-28 LAB — RAPID URINE DRUG SCREEN, HOSP PERFORMED
Amphetamines: NOT DETECTED
Barbiturates: NOT DETECTED
Benzodiazepines: NOT DETECTED
Cocaine: NOT DETECTED
Opiates: NOT DETECTED
Tetrahydrocannabinol: NOT DETECTED

## 2019-03-28 LAB — COMPREHENSIVE METABOLIC PANEL
ALT: 17 U/L (ref 0–44)
AST: 21 U/L (ref 15–41)
Albumin: 4.2 g/dL (ref 3.5–5.0)
Alkaline Phosphatase: 135 U/L — ABNORMAL HIGH (ref 38–126)
Anion gap: 9 (ref 5–15)
BUN: 16 mg/dL (ref 8–23)
CO2: 21 mmol/L — ABNORMAL LOW (ref 22–32)
Calcium: 8.9 mg/dL (ref 8.9–10.3)
Chloride: 112 mmol/L — ABNORMAL HIGH (ref 98–111)
Creatinine, Ser: 1.03 mg/dL (ref 0.61–1.24)
GFR calc Af Amer: >60 mL/min (ref >60)
GFR calc non Af Amer: >60 mL/min (ref >60)
Glucose, Bld: 110 mg/dL — ABNORMAL HIGH (ref 70–99)
Potassium: 3.7 mmol/L (ref 3.5–5.1)
Sodium: 142 mmol/L (ref 135–145)
Total Bilirubin: 0.9 mg/dL (ref 0.3–1.2)
Total Protein: 7 g/dL (ref 6.5–8.1)

## 2019-03-28 LAB — URINALYSIS, ROUTINE W REFLEX MICROSCOPIC
Bacteria, UA: NONE SEEN
Bilirubin Urine: NEGATIVE
Glucose, UA: NEGATIVE mg/dL
Ketones, ur: 5 mg/dL — AB
Leukocytes,Ua: NEGATIVE
Nitrite: NEGATIVE
Protein, ur: NEGATIVE mg/dL
Specific Gravity, Urine: 1.024 (ref 1.005–1.030)
pH: 5 (ref 5.0–8.0)

## 2019-03-28 LAB — ACETAMINOPHEN LEVEL: Acetaminophen (Tylenol), Serum: <10 ug/mL — ABNORMAL LOW (ref 10–30)

## 2019-03-28 LAB — SARS CORONAVIRUS 2 (TAT 6-24 HRS): SARS Coronavirus 2: NEGATIVE

## 2019-03-28 LAB — SALICYLATE LEVEL: Salicylate Lvl: <7 mg/dL (ref 2.8–30.0)

## 2019-03-28 LAB — ETHANOL: Alcohol, Ethyl (B): <10 mg/dL (ref <10)

## 2019-03-28 MED ORDER — ONDANSETRON HCL 4 MG PO TABS: 4.0000 mg | ORAL_TABLET | Freq: Three times a day (TID) | ORAL | Status: DC | PRN

## 2019-03-28 MED ORDER — PANTOPRAZOLE SODIUM 40 MG PO TBEC
40.0000 mg | DELAYED_RELEASE_TABLET | Freq: Every day | ORAL | Status: DC
  Administered 2019-03-29 – 2019-03-31 (×3): 40 mg via ORAL
  Filled 2019-03-28 (×3): qty 1

## 2019-03-28 MED ORDER — VENLAFAXINE HCL ER 75 MG PO CP24
150.0000 mg | ORAL_CAPSULE | Freq: Every day | ORAL | Status: DC
  Administered 2019-03-29 – 2019-03-31 (×3): 150 mg via ORAL
  Filled 2019-03-28 (×2): qty 2
  Filled 2019-03-28: qty 4
  Filled 2019-03-28 (×2): qty 2

## 2019-03-28 MED ORDER — VITAMIN D (ERGOCALCIFEROL) 1.25 MG (50000 UNIT) PO CAPS
50000.0000 [IU] | ORAL_CAPSULE | ORAL | Status: DC
  Administered 2019-03-31: 50000 [IU] via ORAL
  Filled 2019-03-28: qty 1

## 2019-03-28 MED ORDER — ASPIRIN EC 81 MG PO TBEC
81.0000 mg | DELAYED_RELEASE_TABLET | Freq: Every day | ORAL | Status: DC
  Administered 2019-03-29 – 2019-03-31 (×3): 81 mg via ORAL
  Filled 2019-03-28 (×3): qty 1

## 2019-03-28 MED ORDER — ALUM & MAG HYDROXIDE-SIMETH 200-200-20 MG/5ML PO SUSP: 30.0000 mL | Freq: Four times a day (QID) | ORAL | Status: DC | PRN

## 2019-03-28 MED ORDER — CARVEDILOL 3.125 MG PO TABS
3.1250 mg | ORAL_TABLET | Freq: Two times a day (BID) | ORAL | Status: DC
  Administered 2019-03-29 – 2019-03-31 (×5): 3.125 mg via ORAL
  Filled 2019-03-28 (×8): qty 1

## 2019-03-28 MED ORDER — ATORVASTATIN CALCIUM 40 MG PO TABS
80.0000 mg | ORAL_TABLET | Freq: Every day | ORAL | Status: DC
  Administered 2019-03-28 – 2019-03-30 (×3): 80 mg via ORAL
  Filled 2019-03-28 (×3): qty 2

## 2019-03-28 MED ORDER — MEGESTROL ACETATE 400 MG/10ML PO SUSP
400.0000 mg | Freq: Every day | ORAL | Status: DC
  Administered 2019-03-29 – 2019-03-31 (×3): 400 mg via ORAL
  Filled 2019-03-28 (×5): qty 10

## 2019-03-28 MED ORDER — QUETIAPINE FUMARATE 100 MG PO TABS
100.0000 mg | ORAL_TABLET | Freq: Every day | ORAL | Status: DC
  Administered 2019-03-28 – 2019-03-30 (×3): 100 mg via ORAL
  Filled 2019-03-28 (×3): qty 1

## 2019-03-28 MED ORDER — LORAZEPAM 0.5 MG PO TABS
0.5000 mg | ORAL_TABLET | Freq: Four times a day (QID) | ORAL | Status: DC | PRN
  Administered 2019-03-28: 0.5 mg via ORAL
  Filled 2019-03-28: qty 1

## 2019-03-28 MED ORDER — ACETAMINOPHEN 325 MG PO TABS: 650.0000 mg | ORAL_TABLET | ORAL | Status: DC | PRN

## 2019-03-28 MED ORDER — FERROUS SULFATE 325 (65 FE) MG PO TABS
325.0000 mg | ORAL_TABLET | Freq: Two times a day (BID) | ORAL | Status: DC
  Administered 2019-03-29 – 2019-03-31 (×5): 325 mg via ORAL
  Filled 2019-03-28 (×8): qty 1

## 2019-03-28 NOTE — ED Notes (Signed)
Patient transported to X-ray 

## 2019-03-28 NOTE — ED Notes (Signed)
Pt assisted with ambulating to BR. Pt becomes easily agitated. Yelled at this nurse stating I talked to much

## 2019-03-28 NOTE — ED Notes (Signed)
Pt crawled over bedrails and removed cords. Had to rewrap IV site due to bleeding. Pt made statement that he didn't want to live like this, but unable to elaborate TTS in progress

## 2019-03-28 NOTE — ED Notes (Addendum)
Updated son audrick genco on pts condition
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
## 2019-03-28 NOTE — ED Notes (Signed)
Pt continually states I just dont understand, I wish I was dead
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
## 2019-03-28 NOTE — ED Notes (Signed)
Pt continually states Im sick  No specific compliants
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
## 2019-03-28 NOTE — BHH Counselor (Signed)
Disposition: Mordecai Maes, NP recommends geri-psych. TTS to seek placement.

## 2019-03-29 DIAGNOSIS — F332 Major depressive disorder, recurrent severe without psychotic features: Secondary | ICD-10-CM | POA: Diagnosis not present

## 2019-03-29 NOTE — B[facility] Counselor (Signed)
TTS completed reassessment with patient. Patient was seen in room at sink washing his hands and talking to him self. Pt would not look at camera and did not respond to TTS kept talking to self about spiders. Per the nurse tech pt has been hallucinating about spiders crawling on him and in his mouth. NT stated that pt has been at sink washing hands to get spiders off of him and talking to self about spiders. Pt was observed trembling, with scrubs on and not able to respond to questions.

## 2019-03-29 NOTE — BH Assessment (Signed)
Call from [place] with [facility] states that patient is denied due to medical acuity. States that he has a host of medical issues and is to medically complicated for their unit.

## 2019-03-29 NOTE — ED Notes (Signed)
Pt ambulated to restroom and provided a beverage afterwards

## 2019-03-29 NOTE — Progress Notes (Signed)
Pt meets inpatient criteria per Mordecai Maes, NP. Referral information has been sent to the following hospitals for review:  [facility]  [place]      Disposition will continue to assist with inpatient placement needs.   Audree Camel, LCSW, [place] Disposition [facility]/TTS [phone]

## 2019-03-29 NOTE — ED Notes (Signed)
Patient refuse a bath RN  NOTIFIED BED CHANGE

## 2019-03-29 NOTE — ED Notes (Signed)
Placed on hospital bed

## 2019-03-30 DIAGNOSIS — F332 Major depressive disorder, recurrent severe without psychotic features: Secondary | ICD-10-CM | POA: Diagnosis not present

## 2019-03-30 NOTE — ED Notes (Signed)
Pt ambulated to bathroom with sitter

## 2019-03-31 DIAGNOSIS — F332 Major depressive disorder, recurrent severe without psychotic features: Secondary | ICD-10-CM | POA: Diagnosis not present
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
## 2019-03-31 NOTE — ED Notes (Signed)
Pt given meal tray.

## 2019-03-31 NOTE — Discharge Instructions (Addendum)
Home health visit coordinated via the social work department.  Hopefully, they can help you get organized for placement issues.

## 2019-04-08 ENCOUNTER — Encounter (HOSPITAL_COMMUNITY): Payer: Self-pay | Admitting: *Deleted

## 2019-04-08 ENCOUNTER — Other Ambulatory Visit: Payer: Self-pay

## 2019-04-08 ENCOUNTER — Emergency Department (HOSPITAL_COMMUNITY)
Admission: EM | Admit: 2019-04-08 | Discharge: 2019-04-08 | Discharge status: Against Medical Advice | Payer: Medicare Other | Attending: Emergency Medicine | Admitting: Emergency Medicine

## 2019-04-08 ENCOUNTER — Emergency Department (HOSPITAL_COMMUNITY): Payer: Medicare Other

## 2019-04-08 DIAGNOSIS — W19XXXA Unspecified fall, initial encounter: Secondary | ICD-10-CM

## 2019-04-08 DIAGNOSIS — F17290 Nicotine dependence, other tobacco product, uncomplicated: Secondary | ICD-10-CM | POA: Diagnosis not present

## 2019-04-08 DIAGNOSIS — I2510 Atherosclerotic heart disease of native coronary artery without angina pectoris: Secondary | ICD-10-CM | POA: Diagnosis not present

## 2019-04-08 DIAGNOSIS — I5022 Chronic systolic (congestive) heart failure: Secondary | ICD-10-CM | POA: Diagnosis not present

## 2019-04-08 DIAGNOSIS — Z79899 Other long term (current) drug therapy: Secondary | ICD-10-CM | POA: Diagnosis not present

## 2019-04-08 DIAGNOSIS — Z7982 Long term (current) use of aspirin: Secondary | ICD-10-CM | POA: Insufficient documentation

## 2019-04-08 DIAGNOSIS — R296 Repeated falls: Secondary | ICD-10-CM | POA: Insufficient documentation
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
## 2019-04-08 NOTE — ED Provider Notes (Signed)
[facility] EMERGENCY DEPARTMENT Provider Note   CSN: [id number] Arrival date & time: 04/08/19  1700     History   Chief Complaint Chief Complaint  Patient presents with   Fall    HPI Edward Trevino is a 85 y.o. male.     Patient fell out of the couch.  He has been falling a lot lately.  His caregiver has not been around.  Patient has no complaints  The history is provided by the patient. No language interpreter was used.  Fall This is a new problem. The current episode started 3 to 5 hours ago. The problem occurs rarely. The problem has been resolved. Pertinent negatives include no chest pain, no abdominal pain and no headaches. Nothing aggravates the symptoms. Nothing relieves the symptoms. He has tried nothing for the symptoms.    Past Medical History:  Diagnosis Date   Anxiety    Bladder stones 05/22/2015   Choledocholithiasis with obstruction 07/25/2012   Depression    Duodenitis 05/11/15   Erosive esophagitis    Esophageal stricture 05/11/15   Dilated   Gastric out let obstruction    Gastric outlet obstruction 05/07/2015   HOH (hard of hearing)    Insomnia    Ischemic cardiomyopathy    LVEF 20%   Multiple duodenal ulcers 05/11/15   2  were present per EGD.   ST elevation myocardial infarction (STEMI) of anterior wall [facility])    October 2016 - late presentation, managed conservatively   Suicide attempt [facility])     Patient Active Problem List   Diagnosis Date Noted   Syncope and collapse 12/18/2018   Falls 12/17/2018   Constipation 06/11/2015   Rectal bleeding 06/11/2015   Bladder stones 05/22/2015   Acute duodenal ulcer with gastric outlet obstruction    Normocytic anemia 05/21/2015   Duodenal stricture    Abnormal serum level of alkaline phosphatase    Nausea and vomiting 05/20/2015   Esophageal stricture 05/20/2015   Dysphagia 05/20/2015   Depression 05/20/2015   Duodenal ulcer 05/20/2015   Acute esophagitis    Gastric  outflow obstruction    Peptic ulcer    Gastric outlet obstruction 05/07/2015   Hematemesis with nausea    Loss of weight    CAD/History of STEMI-Oct 2016- conservative Rx 05/06/2015   Dyslipidemia 05/06/2015   PAFib/Atrial fibrillation 05/06/2015   Orthostatic hypotension 05/05/2015   HFrEF/Cardiomyopathy, ischemic-EF 20% [id number]   Chronic systolic CHF (congestive heart failure) ([place]) 05/05/2015   Chest pain with moderate risk of acute coronary syndrome 05/03/2015   Acute coronary syndrome ([place]) 02/28/2015   MDD (major depressive disorder), recurrent severe, without psychosis ([place]) 12/19/2012   Insomnia secondary to depression with anxiety 12/19/2012   Anxiety    Abnormal LFTs 10/01/2012   Gallstones 10/01/2012   Anemia 10/01/2012   Erosive esophagitis 07/25/2012   Gastric out let obstruction 07/25/2012   Choledocholithiasis with obstruction 07/25/2012    Class: History of   Obstructive jaundice 07/23/2012    Past Surgical History:  Procedure Laterality Date   APPENDECTOMY     BACK SURGERY     BALLOON DILATION N/A 07/24/2012   Procedure: BALLOON DILATION;  Surgeon: Daneil Dolin, MD;  Location: [facility] ORS;  Service: Endoscopy;  Laterality: N/A;  Balloon Stone Extraction, Drudging   BIOPSY N/A 07/24/2012   Procedure: BIOPSY;  Surgeon: Daneil Dolin, MD;  Location: [facility];  Service: Endoscopy;  Laterality: N/A;  Duodenal and Esophageal Biopsies   BIOPSY N/A 10/18/2012  Procedure: BIOPSY;  Surgeon: Daneil Dolin, MD;  Location: [facility];  Service: Endoscopy;  Laterality: N/A;   CHOLECYSTECTOMY N/A 10/26/2012   Procedure: LAPAROSCOPIC CHOLECYSTECTOMY;  Surgeon: Jamesetta So, MD;  Location: [facility];  Service: General;  Laterality: N/A;   EGD/ERCP  10/18/2012   Dr. Gala Romney: ;yloric channel stenosis, s/p dilation and bx, persisting CBD stones with extension of sphincterotomy and sphincterotomy balloon dilation and stone extraction. Removal of biliary stent    ERCP N/A 07/24/2012   Dr. Gala Romney. Significant abnormalities of the bowl and proximal second portion producing partial gastric outlet stricture and with secondary gastric dilation and severe erosive reflux esophagitis. Biopsy showed benign ulceration. Normal-appearing ampulla, status post biliary sphincterotomy and ampulla balloon dilation, stone extraction and stent placement.   ERCP N/A 10/18/2012   Procedure: ENDOSCOPIC RETROGRADE CHOLANGIOPANCREATOGRAPHY (ERCP);  Surgeon: Daneil Dolin, MD;  Location: [facility];  Service: Endoscopy;  Laterality: N/A;   ESOPHAGEAL DILATION N/A 05/15/2015   Procedure: ESOPHAGEAL DILATION;  Surgeon: Danie Binder, MD;  Location: [facility];  Service: Endoscopy;  Laterality: N/A;   ESOPHAGOGASTRODUODENOSCOPY N/A 07/24/2012   [id number] ampulla s/p biliary sphincterotomy and ampullary   ESOPHAGOGASTRODUODENOSCOPY N/A 05/07/2015   RMR: Severe esophagitis, retained food in the stomach precluded complete exam, pyloric channel/duodenal bulb abnormal with 1 cm ulcer, friability with high-grade gastric outlet obstruction. No dilation performed on Plavix and aspirin.   ESOPHAGOGASTRODUODENOSCOPY N/A 05/11/2015   Rehman: Distal esophageal ulcers with circumferential ulceration at distal 3-4 cm, soft stricture dilated with scope, diffuse erythema and edema in the bulbar mucosa with 2 small ulcers in the distal segment and high-grade stricture in the middle, scope could not be passed. Dilated up to 12 mm. Could not see duodenum beyond this point because the scope was retained in the large stomach.   ESOPHAGOGASTRODUODENOSCOPY N/A 05/15/2015   SLF: Esophagitis with stricture, dilated GE junction stricture up to 14 mm with the balloon, D1/D2 stricture with narrowing of the lumen to 10-11 mm, dilated up to 15 mm with balloon. Downstream duodenum appear normal. Small duodenal bulbar ulcer. Biopsies from the stomach benign gastritis, no H pylori.    ESOPHAGOGASTRODUODENOSCOPY (EGD) WITH PROPOFOL N/A 10/18/2012   [id number] channel stenosis s/p dilation/Persisting common bile duct stones with extension of sphincterotomy     and sphincterotomy balloon dilation and stone extraction.  Biliary stent removal    LAPAROSCOPY N/A 10/31/2012   Procedure: LAPAROSCOPY DIAGNOSTIC;  Surgeon: Donato Heinz, MD;  Location: [facility];  Service: General;  Laterality: N/A;   REMOVAL OF STONES N/A 07/24/2012   Procedure: REMOVAL OF STONES;  Surgeon: Daneil Dolin, MD;  Location: [facility];  Service: Endoscopy;  Laterality: N/A;   REMOVAL OF STONES N/A 10/18/2012   Procedure: REMOVAL OF STONES;  Surgeon: Daneil Dolin, MD;  Location: [facility];  Service: Endoscopy;  Laterality: N/A;   SPHINCTEROTOMY N/A 07/24/2012   Procedure: SPHINCTEROTOMY;  Surgeon: Daneil Dolin, MD;  Location: [facility];  Service: Endoscopy;  Laterality: N/A;        Home Medications    Prior to Admission medications   Medication Sig Start Date End Date Taking? Authorizing Provider  acetaminophen (TYLENOL) 325 MG tablet Take 2 tablets (650 mg total) by mouth every 6 (six) hours as needed for mild pain, fever or headache (or Fever >/= 101). 12/21/18   Roxan Hockey, MD  aspirin EC 81 MG tablet Take 1 tablet (81 mg total) by mouth daily with breakfast. 12/21/18   Emokpae,  Courage, MD  atorvastatin (LIPITOR) 80 MG tablet Take 80 mg by mouth daily at 6 PM.    [provider]  carvedilol (COREG) 3.125 MG tablet Take 1 tablet (3.125 mg total) by mouth 2 (two) times daily with a meal. 12/21/18   Emokpae, Courage, MD  ferrous sulfate 325 (65 FE) MG EC tablet Take 1 tablet (325 mg total) by mouth 2 (two) times daily. 12/21/18   Roxan Hockey, MD  megestrol (MEGACE) 400 MG/10ML suspension Take 10 mLs (400 mg total) by mouth daily. 12/22/18   Roxan Hockey, MD  nitroGLYCERIN (NITRODUR - DOSED IN MG/24 HR) 0.4 mg/hr patch APPLY ONE PATCH TOPICALLY ONCE DAILY Patient taking differently: Place 0.4 mg  onto the skin daily.  06/16/17   Fay Records, MD  pantoprazole (PROTONIX) 40 MG tablet Take 1 tablet (40 mg total) by mouth daily. 12/21/18   Roxan Hockey, MD  QUEtiapine (SEROQUEL) 100 MG tablet Take 1 tablet (100 mg total) by mouth at bedtime. 12/21/18   Roxan Hockey, MD  venlafaxine XR (EFFEXOR-XR) 150 MG 24 hr capsule Take 1 capsule by mouth daily. 01/29/19 01/29/20  [provider]  Vitamin D, Ergocalciferol, (DRISDOL) 1.25 MG (50000 UT) CAPS capsule Take 1 capsule (50,000 Units total) by mouth every Sunday. 12/23/18   Roxan Hockey, MD    Family History Family History  Problem Relation Age of Onset   Depression Sister    Colon cancer Neg Hx    Heart disease Neg Hx     Social History Social History   Tobacco Use   Smoking status: Current Every Day Smoker    Types: Cigars   Smokeless tobacco: Never Used   Tobacco comment: Smokes 4-5 cigars a day "but I don't really inhale."  Substance Use Topics   Alcohol use: No    Alcohol/week: 0.0 standard drinks   Drug use: No     Allergies   Patient has no known allergies.   Review of Systems Review of Systems  Constitutional: Negative for appetite change and fatigue.  HENT: Negative for congestion, ear discharge and sinus pressure.   Eyes: Negative for discharge.  Respiratory: Negative for cough.   Cardiovascular: Negative for chest pain.  Gastrointestinal: Negative for abdominal pain and diarrhea.  Genitourinary: Negative for frequency and hematuria.  Musculoskeletal: Negative for back pain.  Skin: Negative for rash.  Neurological: Negative for seizures and headaches.  Psychiatric/Behavioral: Negative for hallucinations.     Physical Exam Updated Vital Signs BP 109/85    Pulse (!) 111    Temp 98.6 F (37 C) (Oral)    Resp 18    Ht 6\' 3"  (1.905 m)    Wt 81 kg    SpO2 100%    BMI 22.32 kg/m   Physical Exam Vitals signs and nursing note reviewed.  Constitutional:      Appearance: He is  well-developed.  HENT:     Head: Normocephalic.     Nose: Nose normal.  Eyes:     General: No scleral icterus.    Conjunctiva/sclera: Conjunctivae normal.  Neck:     Musculoskeletal: Neck supple.     Thyroid: No thyromegaly.  Cardiovascular:     Rate and Rhythm: Normal rate and regular rhythm.     Heart sounds: No murmur. No friction rub. No gallop.   Pulmonary:     Breath sounds: No stridor. No wheezing or rales.  Chest:     Chest wall: No tenderness.  Abdominal:     General:  There is no distension.     Tenderness: There is no abdominal tenderness. There is no rebound.  Musculoskeletal: Normal range of motion.  Lymphadenopathy:     Cervical: No cervical adenopathy.  Skin:    Findings: No erythema or rash.  Neurological:     Mental Status: He is oriented to person, place, and time.     Motor: No abnormal muscle tone.     Coordination: Coordination normal.  Psychiatric:        Behavior: Behavior normal.      ED Treatments / Results  Labs (all labs ordered are listed, but only abnormal results are displayed) Labs Reviewed  CBC WITH DIFFERENTIAL/PLATELET  COMPREHENSIVE METABOLIC PANEL  URINALYSIS, ROUTINE W REFLEX MICROSCOPIC    EKG None  Radiology No results found.  Procedures Procedures (including critical care time)  Medications Ordered in ED Medications - No data to display   Initial Impression / Assessment and Plan / ED Course  I have reviewed the triage vital signs and the nursing notes.  Pertinent labs & imaging results that were available during my care of the patient were reviewed by me and considered in my medical decision making (see chart for details).       Patient decided to leave AMA before x-rays were done  Final Clinical Impressions(s) / ED Diagnoses   Final diagnoses:  Fall, initial encounter    ED Discharge Orders    None       Milton Ferguson, MD 04/08/19 1807

## 2019-04-08 NOTE — ED Notes (Signed)
Pt stated that he did not wait to be and wanted to leave

## 2019-11-04 ENCOUNTER — Encounter (HOSPITAL_COMMUNITY): Payer: Self-pay

## 2019-11-04 ENCOUNTER — Other Ambulatory Visit: Payer: Self-pay

## 2019-11-04 ENCOUNTER — Emergency Department (HOSPITAL_COMMUNITY)
Admission: EM | Admit: 2019-11-04 | Discharge: 2019-11-04 | Disposition: A | Discharge status: Home / Self Care | Payer: Medicare Other | Attending: Emergency Medicine | Admitting: Emergency Medicine

## 2019-11-04 ENCOUNTER — Emergency Department (HOSPITAL_COMMUNITY): Payer: Medicare Other

## 2019-11-04 DIAGNOSIS — S0012XA Contusion of left eyelid and periocular area, initial encounter: Secondary | ICD-10-CM | POA: Diagnosis not present

## 2019-11-04 DIAGNOSIS — S63282A Dislocation of proximal interphalangeal joint of right middle finger, initial encounter: Secondary | ICD-10-CM | POA: Insufficient documentation

## 2019-11-04 DIAGNOSIS — S61412A Laceration without foreign body of left hand, initial encounter: Secondary | ICD-10-CM | POA: Insufficient documentation

## 2019-11-04 DIAGNOSIS — Y929 Unspecified place or not applicable: Secondary | ICD-10-CM | POA: Insufficient documentation

## 2019-11-04 DIAGNOSIS — S0990XA Unspecified injury of head, initial encounter: Secondary | ICD-10-CM | POA: Diagnosis not present

## 2019-11-04 DIAGNOSIS — S63259A Unspecified dislocation of unspecified finger, initial encounter: Secondary | ICD-10-CM

## 2019-11-04 DIAGNOSIS — Y939 Activity, unspecified: Secondary | ICD-10-CM | POA: Insufficient documentation

## 2019-11-04 DIAGNOSIS — Y999 Unspecified external cause status: Secondary | ICD-10-CM | POA: Diagnosis not present

## 2019-11-04 DIAGNOSIS — F17290 Nicotine dependence, other tobacco product, uncomplicated: Secondary | ICD-10-CM | POA: Insufficient documentation

## 2019-11-04 DIAGNOSIS — Z7982 Long term (current) use of aspirin: Secondary | ICD-10-CM | POA: Diagnosis not present

## 2019-11-04 DIAGNOSIS — F0390 Unspecified dementia without behavioral disturbance: Secondary | ICD-10-CM | POA: Insufficient documentation

## 2019-11-04 DIAGNOSIS — W010XXA Fall on same level from slipping, tripping and stumbling without subsequent striking against object, initial encounter: Secondary | ICD-10-CM | POA: Insufficient documentation

## 2019-11-04 DIAGNOSIS — I2510 Atherosclerotic heart disease of native coronary artery without angina pectoris: Secondary | ICD-10-CM | POA: Insufficient documentation

## 2019-11-04 DIAGNOSIS — S50312A Abrasion of left elbow, initial encounter: Secondary | ICD-10-CM | POA: Diagnosis not present

## 2019-11-04 DIAGNOSIS — S61214A Laceration without foreign body of right ring finger without damage to nail, initial encounter: Secondary | ICD-10-CM | POA: Insufficient documentation

## 2019-11-04 DIAGNOSIS — W19XXXA Unspecified fall, initial encounter: Secondary | ICD-10-CM

## 2019-11-04 DIAGNOSIS — S60921A Unspecified superficial injury of right hand, initial encounter: Secondary | ICD-10-CM | POA: Diagnosis present

## 2019-11-04 DIAGNOSIS — T148XXA Other injury of unspecified body region, initial encounter: Secondary | ICD-10-CM

## 2019-11-04 MED ORDER — LIDOCAINE HCL (PF) 1 % IJ SOLN
10.0000 mL | Freq: Once | INTRAMUSCULAR | Status: DC
  Filled 2019-11-04: qty 30

## 2019-11-04 MED ORDER — CEPHALEXIN 500 MG PO CAPS: 500.0000 mg | ORAL_CAPSULE | Freq: Three times a day (TID) | ORAL | 0 refills | Status: AC

## 2019-11-04 NOTE — ED Notes (Signed)
Non stick telfa dressing applied to left elbow.

## 2019-11-04 NOTE — ED Notes (Signed)
Pt transported to radiology.

## 2019-11-04 NOTE — Discharge Instructions (Addendum)
Please pick up antibiotics and take as prescribed to cover for any infection related to your skin abrasions Follow up with Dr. Aline Brochure orthopedist regarding your finger dislocation - we relocated it in the ED to the best of our ability. Keep splint on until you can see Dr. Aline Brochure as it is possible that the finger can re-dislocate.  Keep wounds clean and dry

## 2019-11-04 NOTE — Clinical Social Work Note (Signed)
CSW met with patient in ED. Patient reported [name] is his caregiver, but was unsure who [name] is hired by/through. CSW attempted to call son, Avry Roedl, was unable to reach him or leave a voicemail. CSW left voicemail with Dan Europe and requested call back.  Tobi Bastos, LCSW Transitions of Care Clinical Social Worker [facility] Ph: [phone]

## 2019-11-04 NOTE — Social Work (Signed)
[facility] social worker called [name] @ [phone]. [name] reports that she is the hired caregiver for the Pt (M-F days)  [name] states that while she has no certifications, she feels that she is confident in her ability to change wound dressing for Pt and has done so on prior occasions.  [name] also states that she is able to assist with medication management for Pt. [facility] CSW will continue to follow as needed. Vergie Living MSW LCSWA Transitions of Care  Clinical Social Worker  [facility] Departments  [phone]

## 2019-11-04 NOTE — ED Notes (Addendum)
Pt wounds cleaned with safeclens on left elbow, right ring finger and left thumb.

## 2019-11-04 NOTE — ED Triage Notes (Signed)
Fell yesterday, skin tears to both hand and left elbow, bandaged on arrival

## 2019-11-04 NOTE — ED Provider Notes (Signed)
[facility] EMERGENCY DEPARTMENT Provider Note   CSN: [id number] Arrival date & time: 11/04/19  1116     History Chief Complaint  Patient presents with  . Fall   LEVEL 5 CAVEAT - DEMENTIA  Edward Trevino is a 86 y.o. male who presents to the ED today with complaint of fall that occurred yesterday.  Patient believes that he tripped over something causing him to fall.  He states that he fell onto his hands, he has an abrasion noted to the left elbow as well as laceration to the webspace between his first and second digit on the left hand, skin tear noted to the fourth digit on his right hand.  Patient is also found to have ecchymosis to the left forehead, periorbital region.  He states he thinks this is old and he does not think he hit his head yesterday.  Patient states he lives alone, no family at bedside to provide additional information.  Does appear patient has a caregiver, [name] who checks on him during the week.  She was not there this weekend however she was notified by patient's roommate yesterday around 6 PM the patient had taken a fall earlier that morning.  She believes that patient was able to pick himself up after the fall and does not think he was down for an unknown period of time.  She states that the bruise on his face is new.  She denies patient being on any anticoagulation.  She believes his tetanus is up-to-date, per chart review tetanus updated in August 2020.   The history is provided by the patient, medical records and a caregiver.       Past Medical History:  Diagnosis Date  . Anxiety   . Bladder stones 05/22/2015  . Choledocholithiasis with obstruction 07/25/2012  . Depression   . Duodenitis 05/11/15  . Erosive esophagitis   . Esophageal stricture 05/11/15   Dilated  . Gastric out let obstruction   . Gastric outlet obstruction 05/07/2015  . HOH (hard of hearing)   . Insomnia   . Ischemic cardiomyopathy    LVEF 20%  . Multiple duodenal ulcers 05/11/15   2   were present per EGD.  . ST elevation myocardial infarction (STEMI) of anterior wall [facility])    October 2016 - late presentation, managed conservatively  . Suicide attempt [facility])     Patient Active Problem List   Diagnosis Date Noted  . Syncope and collapse 12/18/2018  . Falls 12/17/2018  . Constipation 06/11/2015  . Rectal bleeding 06/11/2015  . Bladder stones 05/22/2015  . Acute duodenal ulcer with gastric outlet obstruction   . Normocytic anemia 05/21/2015  . Duodenal stricture   . Abnormal serum level of alkaline phosphatase   . Nausea and vomiting 05/20/2015  . Esophageal stricture 05/20/2015  . Dysphagia 05/20/2015  . Depression 05/20/2015  . Duodenal ulcer 05/20/2015  . Acute esophagitis   . Gastric outflow obstruction   . Peptic ulcer   . Gastric outlet obstruction 05/07/2015  . Hematemesis with nausea   . Loss of weight   . CAD/History of STEMI-Oct 2016- conservative Rx 05/06/2015  . Dyslipidemia 05/06/2015  . PAFib/Atrial fibrillation 05/06/2015  . Orthostatic hypotension 05/05/2015  . HFrEF/Cardiomyopathy, ischemic-EF 20% 05/05/2015  . Chronic systolic CHF (congestive heart failure) ([place]) 05/05/2015  . Chest pain with moderate risk of acute coronary syndrome 05/03/2015  . Acute coronary syndrome ([place]) 02/28/2015  . MDD (major depressive disorder), recurrent severe, without psychosis ([place]) 12/19/2012  . Insomnia  secondary to depression with anxiety 12/19/2012  . Anxiety   . Abnormal LFTs 10/01/2012  . Gallstones 10/01/2012  . Anemia 10/01/2012  . Erosive esophagitis 07/25/2012  . Gastric out let obstruction 07/25/2012  . Choledocholithiasis with obstruction 07/25/2012    Class: History of  . Obstructive jaundice 07/23/2012    Past Surgical History:  Procedure Laterality Date  . APPENDECTOMY    . BACK SURGERY    . BALLOON DILATION N/A 07/24/2012   Procedure: BALLOON DILATION;  Surgeon: Daneil Dolin, MD;  Location: [facility] ORS;  Service: Endoscopy;  Laterality:  N/A;  Balloon Stone Extraction, Drudging  . BIOPSY N/A 07/24/2012   Procedure: BIOPSY;  Surgeon: Daneil Dolin, MD;  Location: [facility] ORS;  Service: Endoscopy;  Laterality: N/A;  Duodenal and Esophageal Biopsies  . BIOPSY N/A 10/18/2012   Procedure: BIOPSY;  Surgeon: Daneil Dolin, MD;  Location: [facility] ORS;  Service: Endoscopy;  Laterality: N/A;  . CHOLECYSTECTOMY N/A 10/26/2012   Procedure: LAPAROSCOPIC CHOLECYSTECTOMY;  Surgeon: Jamesetta So, MD;  Location: [facility] ORS;  Service: General;  Laterality: N/A;  . EGD/ERCP  10/18/2012   Dr. Gala Romney: ;yloric channel stenosis, s/p dilation and bx, persisting CBD stones with extension of sphincterotomy and sphincterotomy balloon dilation and stone extraction. Removal of biliary stent  . ERCP N/A 07/24/2012   Dr. Gala Romney. Significant abnormalities of the bowl and proximal second portion producing partial gastric outlet stricture and with secondary gastric dilation and severe erosive reflux esophagitis. Biopsy showed benign ulceration. Normal-appearing ampulla, status post biliary sphincterotomy and ampulla balloon dilation, stone extraction and stent placement.  Marland Kitchen ERCP N/A 10/18/2012   Procedure: ENDOSCOPIC RETROGRADE CHOLANGIOPANCREATOGRAPHY (ERCP);  Surgeon: Daneil Dolin, MD;  Location: [facility];  Service: Endoscopy;  Laterality: N/A;  . ESOPHAGEAL DILATION N/A 05/15/2015   Procedure: ESOPHAGEAL DILATION;  Surgeon: Danie Binder, MD;  Location: [facility] END[facility] SUITE;  Service: Endoscopy;  Laterality: N/A;  . ESOPHAGOGASTRODUODENOSCOPY N/A 07/24/2012   XVQ:[id number] ampulla s/p biliary sphincterotomy and ampullary  . ESOPHAGOGASTRODUODENOSCOPY N/A 05/07/2015   RMR: Severe esophagitis, retained food in the stomach precluded complete exam, pyloric channel/duodenal bulb abnormal with 1 cm ulcer, friability with high-grade gastric outlet obstruction. No dilation performed on Plavix and aspirin.  . ESOPHAGOGASTRODUODENOSCOPY N/A 05/11/2015   Rehman: Distal esophageal ulcers with  circumferential ulceration at distal 3-4 cm, soft stricture dilated with scope, diffuse erythema and edema in the bulbar mucosa with 2 small ulcers in the distal segment and high-grade stricture in the middle, scope could not be passed. Dilated up to 12 mm. Could not see duodenum beyond this point because the scope was retained in the large stomach.  . ESOPHAGOGASTRODUODENOSCOPY N/A 05/15/2015   SLF: Esophagitis with stricture, dilated GE junction stricture up to 14 mm with the balloon, D1/D2 stricture with narrowing of the lumen to 10-11 mm, dilated up to 15 mm with balloon. Downstream duodenum appear normal. Small duodenal bulbar ulcer. Biopsies from the stomach benign gastritis, no H pylori.  . ESOPHAGOGASTRODUODENOSCOPY (EGD) WITH PROPOFOL N/A 10/18/2012   TIW:PYKDXIP channel stenosis s/p dilation/Persisting common bile duct stones with extension of sphincterotomy     and sphincterotomy balloon dilation and stone extraction.  Biliary stent removal   . LAPAROSCOPY N/A 10/31/2012   Procedure: LAPAROSCOPY DIAGNOSTIC;  Surgeon: Donato Heinz, MD;  Location: [facility];  Service: General;  Laterality: N/A;  . REMOVAL OF STONES N/A 07/24/2012   Procedure: REMOVAL OF STONES;  Surgeon: Daneil Dolin, MD;  Location: [facility];  Service:  Endoscopy;  Laterality: N/A;  . REMOVAL OF STONES N/A 10/18/2012   Procedure: REMOVAL OF STONES;  Surgeon: Daneil Dolin, MD;  Location: [facility];  Service: Endoscopy;  Laterality: N/A;  . SPHINCTEROTOMY N/A 07/24/2012   Procedure: SPHINCTEROTOMY;  Surgeon: Daneil Dolin, MD;  Location: [facility] OR[facility];  Service: Endoscopy;  Laterality: N/A;       Family History  Problem Relation Age of Onset  . Depression Sister   . Colon cancer Neg Hx   . Heart disease Neg Hx     Social History   Tobacco Use  . Smoking status: Current Every Day Smoker    Types: Cigars  . Smokeless tobacco: Never Used  . Tobacco comment: Smokes 4-5 cigars a day "but I don't really inhale."  Vaping Use  .  Vaping Use: Never used  Substance Use Topics  . Alcohol use: No    Alcohol/week: 0.0 standard drinks  . Drug use: No    Home Medications Prior to Admission medications   Medication Sig Start Date End Date Taking? Authorizing Provider  acetaminophen (TYLENOL) 325 MG tablet Take 2 tablets (650 mg total) by mouth every 6 (six) hours as needed for mild pain, fever or headache (or Fever >/= 101). 12/21/18   Roxan Hockey, MD  aspirin EC 81 MG tablet Take 1 tablet (81 mg total) by mouth daily with breakfast. 12/21/18   Denton Brick, Courage, MD  atorvastatin (LIPITOR) 80 MG tablet Take 80 mg by mouth daily at 6 PM.    [provider]  carvedilol (COREG) 3.125 MG tablet Take 1 tablet (3.125 mg total) by mouth 2 (two) times daily with a meal. 12/21/18   Emokpae, Courage, MD  cephALEXin (KEFLEX) 500 MG capsule Take 1 capsule (500 mg total) by mouth 3 (three) times daily for 7 days. 11/04/19 11/11/19  Alroy Bailiff, Deshanda Molitor, PA-C  FEROSUL 325 (65 Fe) MG tablet Take 325 mg by mouth daily. 07/26/19   [provider]  ferrous sulfate 325 (65 FE) MG EC tablet Take 1 tablet (325 mg total) by mouth 2 (two) times daily. 12/21/18   Roxan Hockey, MD  megestrol (MEGACE) 400 MG/10ML suspension Take 10 mLs (400 mg total) by mouth daily. 12/22/18   Roxan Hockey, MD  nitroGLYCERIN (NITRODUR - DOSED IN MG/24 HR) 0.4 mg/hr patch APPLY ONE PATCH TOPICALLY ONCE DAILY Patient taking differently: Place 0.4 mg onto the skin daily.  06/16/17   Fay Records, MD  pantoprazole (PROTONIX) 40 MG tablet Take 1 tablet (40 mg total) by mouth daily. 12/21/18   Roxan Hockey, MD  QUEtiapine (SEROQUEL) 200 MG tablet Take 200 mg by mouth at bedtime. 09/12/19   [provider]  venlafaxine XR (EFFEXOR-XR) 150 MG 24 hr capsule Take 1 capsule by mouth daily. 01/29/19 01/29/20  [provider]  Vitamin D, Ergocalciferol, (DRISDOL) 1.25 MG (50000 UT) CAPS capsule Take 1 capsule (50,000 Units total) by mouth every Sunday.  12/23/18   Roxan Hockey, MD    Allergies    Patient has no known allergies.  Review of Systems   Review of Systems  Unable to perform ROS: Dementia  Skin: Positive for wound.    Physical Exam Updated Vital Signs BP 139/82 (BP Location: Right Arm)   Pulse 85   Temp 98.6 F (37 C) (Oral)   Resp 18   Ht 6\' 3"  (1.905 m)   Wt 113.4 kg   SpO2 98%   BMI 31.25 kg/m   Physical Exam Vitals and nursing note  reviewed.  Constitutional:      Appearance: He is not ill-appearing or diaphoretic.  HENT:     Head: Normocephalic.     Comments: Left periorbital ecchymosis noted without TTP. Eyes:     General:        Right eye: No discharge.        Left eye: No discharge.     Extraocular Movements: Extraocular movements intact.     Conjunctiva/sclera: Conjunctivae normal.     Pupils: Pupils are equal, round, and reactive to light.  Cardiovascular:     Rate and Rhythm: Normal rate and regular rhythm.     Pulses: Normal pulses.  Pulmonary:     Effort: Pulmonary effort is normal.     Breath sounds: Normal breath sounds. No wheezing, rhonchi or rales.  Abdominal:     Palpations: Abdomen is soft.     Tenderness: There is no abdominal tenderness. There is no guarding or rebound.  Musculoskeletal:     Cervical back: Neck supple.     Comments: No C, T, or L midline spinal TTP.   + abrasion noted to L elbow without TTP. ROM intact to elbow. No tenderness proximally or distally. There is an additional 2 cm laceration noted to the webspace between the 1st and 2nd digits on the left hand. 2+ radial pulse on left side.   + skin avulsion noted to the palmar surface of R 4th digit proximally. R 4th finger is swollen with limited ROM secondary to edema. Cap refill < 2 seconds. Radial pulse 2+.   No tenderness to remainder of joints. Pelvis is stable.   Skin:    General: Skin is warm and dry.  Neurological:     Mental Status: He is alert.     Comments: Pt is alert to self and place.       ED Results / Procedures / Treatments   Labs (all labs ordered are listed, but only abnormal results are displayed) Labs Reviewed - No data to display  EKG None  Radiology DG Elbow Complete Left  Result Date: 11/04/2019 CLINICAL DATA:  [place] yesterday, laceration posterior elbow LEFT EXAM: LEFT ELBOW - COMPLETE 3+ VIEW COMPARISON:  None FINDINGS: Osseous demineralization. Joint spaces preserved. No acute fracture, dislocation, or bone destruction. No definite joint effusion. IMPRESSION: No acute osseous abnormalities. Electronically Signed   By: Lavonia Dana M.D.   On: 11/04/2019 14:37   CT Head Wo Contrast  Result Date: 11/04/2019 CLINICAL DATA:  Fall yesterday EXAM: CT HEAD WITHOUT CONTRAST TECHNIQUE: Contiguous axial images were obtained from the base of the skull through the vertex without intravenous contrast. COMPARISON:  07/22/2012 FINDINGS: Brain: No evidence of acute infarction, hemorrhage, hydrocephalus, extra-axial collection or mass lesion/mass effect. Extensive periventricular and deep white matter hypodensity. Mild global cerebral volume loss. Vascular: No hyperdense vessel or unexpected calcification. Skull: Normal. Negative for fracture or focal lesion. Sinuses/Orbits: No acute finding. Other: Soft tissue contusion over the left forehead. IMPRESSION: 1. No acute intracranial pathology. Small-vessel white matter disease and cerebral volume loss. 2. Soft tissue contusion over the left forehead. Electronically Signed   By: Eddie Candle M.D.   On: 11/04/2019 14:14   CT Cervical Spine Wo Contrast  Result Date: 11/04/2019 CLINICAL DATA:  Neck pain, acute, no red flags. Additional provided: Patient reports fall yesterday, bruising all over body, head trauma, bruising to left side of face. EXAM: CT CERVICAL SPINE WITHOUT CONTRAST TECHNIQUE: Multidetector CT imaging of the cervical spine was performed without intravenous  contrast. Multiplanar CT image reconstructions were also  generated. COMPARISON:  CT cervical spine 12/17/2018. FINDINGS: The examination is mildly motion degraded. Alignment: Straightening of the expected cervical lordosis. No significant spondylolisthesis. Skull base and vertebrae: The basion-dental and atlanto-dental intervals are maintained.No evidence of acute fracture to the cervical spine. Soft tissues and spinal canal: No prevertebral fluid or swelling. No visible canal hematoma. Disc levels: Cervical spondylosis with multilevel disc space narrowing, posterior disc osteophytes, uncovertebral and facet hypertrophy. Redemonstrated C5-C6 and C6-C7 fusion. Upper chest: The left lung apex is excluded from the field of view. Redemonstrated pleuroparenchymal scarring within the partially imaged right lung apex. IMPRESSION: Mildly motion degraded exam. No evidence of acute fracture to the cervical spine. Cervical spondylosis as described. Redemonstrated C5-C6 and C6-C7 fusion. Electronically Signed   By: Kellie Simmering DO   On: 11/04/2019 14:21   DG Hand Complete Left  Result Date: 11/04/2019 CLINICAL DATA:  [place] yesterday, lacerations to both hands EXAM: LEFT HAND - COMPLETE 3+ VIEW COMPARISON:  None FINDINGS: Osseous demineralization. Scattered degenerative changes of IP joints and first MCP joint. No acute fracture, dislocation, or bone destruction. Hyperextension at IP joint thumb. No erosive changes. IMPRESSION: Scattered degenerative changes and osseous demineralization. No acute osseous abnormalities. Electronically Signed   By: Lavonia Dana M.D.   On: 11/04/2019 14:38   DG Hand Complete Right  Result Date: 11/04/2019 CLINICAL DATA:  [place] yesterday, lacerations to both hands EXAM: RIGHT HAND - COMPLETE 3+ VIEW COMPARISON:  None FINDINGS: Scattered degenerative changes at IP joints. Diffuse osseous demineralization. Dorsal dislocation PIP joint RIGHT ring finger with associated soft tissue swelling. No acute fracture, additional dislocation, or bone destruction.  IMPRESSION: Dorsal dislocation PIP joint RIGHT ring finger. No additional acute osseous abnormalities. Electronically Signed   By: Lavonia Dana M.D.   On: 11/04/2019 14:41   DG Finger Ring Right  Result Date: 11/04/2019 CLINICAL DATA:  Status post reduction. EXAM: RIGHT RING FINGER 2+V COMPARISON:  November 04, 2019 (1:23 p.m.) FINDINGS: There is no evidence of fracture or dislocation. Interval reduction of the PIP joint dislocation of the fourth right finger is seen since the prior study. There is no evidence of arthropathy or other focal bone abnormality. Soft tissues are unremarkable. IMPRESSION: Interval reduction of PIP joint dislocation of the fourth right finger. Electronically Signed   By: Virgina Norfolk M.D.   On: 11/04/2019 15:43   CT Maxillofacial WO CM  Result Date: 11/04/2019 CLINICAL DATA:  Facial trauma. Additional history provided: Patient reports fall yesterday, pain to left side of face with bruising around the eye. EXAM: CT MAXILLOFACIAL WITHOUT CONTRAST TECHNIQUE: Multidetector CT imaging of the maxillofacial structures was performed. Multiplanar CT image reconstructions were also generated. COMPARISON:  Concurrently performed CT head and CT cervical spine. Head CT 12/17/2018 FINDINGS: Mildly motion degraded examination. Osseous: No acute maxillofacial fracture is identified. Redemonstrated chronic deformity of the tip of the right coronoid process (series 9, image 15). Orbits: There is left forehead and left periorbital soft tissue swelling/hematoma. The orbits are otherwise unremarkable. Sinuses: Mild maxillary sinus mucosal thickening. No significant mastoid effusion. Soft tissues: Left forehead and left periorbital soft tissue swelling/hematoma. The visualized maxillofacial and upper neck soft tissues are otherwise unremarkable. Limited intracranial: Reported separately. IMPRESSION: Mildly motion degraded examination. No evidence of acute maxillofacial fracture. Left forehead and left  periorbital soft tissue swelling/hematoma. Mild maxillary sinus mucosal thickening. Electronically Signed   By: Kellie Simmering DO   On: 11/04/2019 14:32    Procedures .  Nerve Block  Date/Time: 11/04/2019 3:11 PM Performed by: Eustaquio Maize, PA-C Authorized by: Eustaquio Maize, PA-C   Consent:    Consent obtained:  Verbal   Consent given by:  Patient Indications:    Indications:  Procedural anesthesia Pre-procedure details:    Skin preparation:  Alcohol Procedure details (see MAR for exact dosages):    Block needle gauge:  25 G   Anesthetic injected:  Lidocaine 1% w/o epi Post-procedure details:    Outcome:  Anesthesia achieved   Patient tolerance of procedure:  Tolerated well, no immediate complications Reduction of dislocation  Date/Time: 11/04/2019 3:12 PM Performed by: Eustaquio Maize, PA-C Authorized by: Eustaquio Maize, PA-C  Local anesthesia used: yes Anesthesia: nerve block  Anesthesia: Local anesthesia used: yes Local Anesthetic: lidocaine 1% without epinephrine Anesthetic total: 3 mL  Sedation: Patient sedated: no  Patient tolerance: patient tolerated the procedure well with no immediate complications    (including critical care time)  Medications Ordered in ED Medications  lidocaine (PF) (XYLOCAINE) 1 % injection 10 mL (has no administration in time range)    ED Course  I have reviewed the triage vital signs and the nursing notes.  Pertinent labs & imaging results that were available during my care of the patient were reviewed by me and considered in my medical decision making (see chart for details).  Clinical Course as of Nov 04 1638  Mon Nov 04, 2019  1445 DG Hand Complete Left [CW]    Clinical Course User Index [CW] Elmo Putt   MDM Rules/Calculators/A&P                          86 year old male with dementia who presents the ED today after fall yesterday with multiple abrasions and bruising.  Patient is demented and  unable to provide much history however I was able to speak with [name] who appears to be patient's caregiver.  She does report that she was informed by family that he fell sometime yesterday morning.  She does not believe he had an unknown downtime, doubt rhabdo at this time.  Patient does have bruising noted to his left forehead and periorbital region as well as an abrasion to his left, abrasion noted to the right ring finger with fusiform swelling and tenderness palpation and a small superficial laceration to the webspace of his first and second digit on the left hand.  It appears patient is at baseline per caregiver.  He is alert to self and place however believes it is 46.  Does not appear patient is on a blood thinner however given fall with ecchymosis noted to the face will obtain CT head, CT C-spine, CT maxillofacial.  Plan for x-rays of the elbow, left hand, right hand.  Will have nursing staff clean wounds and place dressing, given it has been over 24 hours I do not feel patient needs primary closure at this time.   CT scans negative.  X-ray of right hand does show obvious dislocation of the middle phalanx proximally per my read, waiting on radiologist read at this time.  Will plan for manual reduction in the ED.  Patient does have a skin abrasion over this area however it does appear to be more from significant swelling causing skin disruption than an open wound.  Will reevaluate once finger relocated.   Nerve block and reduction performed.  Postreduction x-ray obtained with interval reduction of finger.  It does appear significantly more in place compared  to previous, splint applied.  Will have patient follow-up outpatient with orthopedist.  Given abrasion over this area I will plan to cover with Keflex to prevent any type of infection.  It does appear patient lives at home and has had multiple falls, will have social work reach out to patient's caregiver to see if he has appropriate care at home  versus needing SNF.   Social work has spoken with caregiver - see note. Pt to be discharged home at this time.   This note was prepared using Dragon voice recognition software and may include unintentional dictation errors due to the inherent limitations of voice recognition software.   Final Clinical Impression(s) / ED Diagnoses Final diagnoses:  Fall, initial encounter  Injury of head, initial encounter  Dislocation of finger, initial encounter  Skin abrasion    Rx / DC Orders ED Discharge Orders         Ordered    cephALEXin (KEFLEX) 500 MG capsule  3 times daily     Discontinue  Reprint     11/04/19 1553           Discharge Instructions     Please pick up antibiotics and take as prescribed to cover for any infection related to your skin abrasions Follow up with Dr. Aline Brochure orthopedist regarding your finger dislocation - we relocated it in the ED to the best of our ability. Keep splint on until you can see Dr. Aline Brochure as it is possible that the finger can re-dislocate.  Keep wounds clean and dry       Eustaquio Maize, PA-C 11/04/19 1641    Long, [place], MD 11/05/19 1226

## 2019-11-08 ENCOUNTER — Ambulatory Visit (INDEPENDENT_AMBULATORY_CARE_PROVIDER_SITE_OTHER): Payer: Medicare Other | Admitting: Orthopedic Surgery

## 2019-11-08 ENCOUNTER — Encounter: Payer: Self-pay | Admitting: Orthopedic Surgery

## 2019-11-08 ENCOUNTER — Other Ambulatory Visit: Payer: Self-pay

## 2019-11-08 VITALS — BP 126/83 | HR 94 | Ht 75.25 in | Wt 159.5 lb

## 2019-11-08 DIAGNOSIS — S63274A Dislocation of unspecified interphalangeal joint of right ring finger, initial encounter: Secondary | ICD-10-CM

## 2019-11-08 DIAGNOSIS — S63284A Dislocation of proximal interphalangeal joint of right ring finger, initial encounter: Secondary | ICD-10-CM | POA: Diagnosis not present

## 2019-11-08 NOTE — Progress Notes (Signed)
NEW PROBLEM//OFFICE VISIT  Chief Complaint  Patient presents with  . Hand Pain    R/ DOI 11/03/19/fell at home hurts when trying to move it    HPI 86 yo male fell at home injured the right ring finger and sustained multiple abrasions over the right and left elbow  He had a dislocation of his PIP joint of the right ring finger which was closed reduced in the emergency room with a stable reduction  Complains of pain at the PIP joint with swelling Review of Systems  Constitutional: Negative for fever.  Musculoskeletal: Positive for falls and joint pain.  Skin:       Skin abrasions right and left elbow     Past Medical History:  Diagnosis Date  . Anxiety   . Bladder stones 05/22/2015  . Choledocholithiasis with obstruction 07/25/2012  . Depression   . Duodenitis 05/11/15  . Erosive esophagitis   . Esophageal stricture 05/11/15   Dilated  . Gastric out let obstruction   . Gastric outlet obstruction 05/07/2015  . HOH (hard of hearing)   . Insomnia   . Ischemic cardiomyopathy    LVEF 20%  . Multiple duodenal ulcers 05/11/15   2  were present per EGD.  . ST elevation myocardial infarction (STEMI) of anterior wall [facility])    October 2016 - late presentation, managed conservatively  . Suicide attempt [facility])     Past Surgical History:  Procedure Laterality Date  . APPENDECTOMY    . BACK SURGERY    . BALLOON DILATION N/A 07/24/2012   Procedure: BALLOON DILATION;  Surgeon: Daneil Dolin, MD;  Location: [facility];  Service: Endoscopy;  Laterality: N/A;  Balloon Stone Extraction, Drudging  . BIOPSY N/A 07/24/2012   Procedure: BIOPSY;  Surgeon: Daneil Dolin, MD;  Location: [facility];  Service: Endoscopy;  Laterality: N/A;  Duodenal and Esophageal Biopsies  . BIOPSY N/A 10/18/2012   Procedure: BIOPSY;  Surgeon: Daneil Dolin, MD;  Location: [facility];  Service: Endoscopy;  Laterality: N/A;  . CHOLECYSTECTOMY N/A 10/26/2012   Procedure: LAPAROSCOPIC CHOLECYSTECTOMY;  Surgeon: Jamesetta So, MD;   Location: [facility];  Service: General;  Laterality: N/A;  . EGD/ERCP  10/18/2012   Dr. Gala Romney: ;yloric channel stenosis, s/p dilation and bx, persisting CBD stones with extension of sphincterotomy and sphincterotomy balloon dilation and stone extraction. Removal of biliary stent  . ERCP N/A 07/24/2012   Dr. Gala Romney. Significant abnormalities of the bowl and proximal second portion producing partial gastric outlet stricture and with secondary gastric dilation and severe erosive reflux esophagitis. Biopsy showed benign ulceration. Normal-appearing ampulla, status post biliary sphincterotomy and ampulla balloon dilation, stone extraction and stent placement.  Marland Kitchen ERCP N/A 10/18/2012   Procedure: ENDOSCOPIC RETROGRADE CHOLANGIOPANCREATOGRAPHY (ERCP);  Surgeon: Daneil Dolin, MD;  Location: [facility];  Service: Endoscopy;  Laterality: N/A;  . ESOPHAGEAL DILATION N/A 05/15/2015   Procedure: ESOPHAGEAL DILATION;  Surgeon: Danie Binder, MD;  [facility];  Service: Endoscopy;  Laterality: N/A;  . ESOPHAGOGASTRODUODENOSCOPY N/A 07/24/2012   DXA:JOINOM-VEHMCNOBS ampulla s/p biliary sphincterotomy and ampullary  . ESOPHAGOGASTRODUODENOSCOPY N/A 05/07/2015   RMR: Severe esophagitis, retained food in the stomach precluded complete exam, pyloric channel/duodenal bulb abnormal with 1 cm ulcer, friability with high-grade gastric outlet obstruction. No dilation performed on Plavix and aspirin.  . ESOPHAGOGASTRODUODENOSCOPY N/A 05/11/2015   Rehman: Distal esophageal ulcers with circumferential ulceration at distal 3-4 cm, soft stricture dilated with scope, diffuse erythema and edema in the bulbar mucosa  with 2 small ulcers in the distal segment and high-grade stricture in the middle, scope could not be passed. Dilated up to 12 mm. Could not see duodenum beyond this point because the scope was retained in the large stomach.  . ESOPHAGOGASTRODUODENOSCOPY N/A 05/15/2015   SLF: Esophagitis with stricture, dilated GE  junction stricture up to 14 mm with the balloon, D1/D2 stricture with narrowing of the lumen to 10-11 mm, dilated up to 15 mm with balloon. Downstream duodenum appear normal. Small duodenal bulbar ulcer. Biopsies from the stomach benign gastritis, no H pylori.  . ESOPHAGOGASTRODUODENOSCOPY (EGD) WITH PROPOFOL N/A 10/18/2012   GYF:VCBSWHQ channel stenosis s/p dilation/Persisting common bile duct stones with extension of sphincterotomy     and sphincterotomy balloon dilation and stone extraction.  Biliary stent removal   . LAPAROSCOPY N/A 10/31/2012   Procedure: LAPAROSCOPY DIAGNOSTIC;  Surgeon: Donato Heinz, MD;  Location: [facility];  Service: General;  Laterality: N/A;  . REMOVAL OF STONES N/A 07/24/2012   Procedure: REMOVAL OF STONES;  Surgeon: Daneil Dolin, MD;  Location: [facility];  Service: Endoscopy;  Laterality: N/A;  . REMOVAL OF STONES N/A 10/18/2012   Procedure: REMOVAL OF STONES;  Surgeon: Daneil Dolin, MD;  Location: [facility];  Service: Endoscopy;  Laterality: N/A;  . SPHINCTEROTOMY N/A 07/24/2012   Procedure: SPHINCTEROTOMY;  Surgeon: Daneil Dolin, MD;  Location: [facility];  Service: Endoscopy;  Laterality: N/A;    Family History  Problem Relation Age of Onset  . Depression Sister   . Colon cancer Neg Hx   . Heart disease Neg Hx    Social History   Tobacco Use  . Smoking status: Current Every Day Smoker    Types: Cigars  . Smokeless tobacco: Never Used  . Tobacco comment: Smokes 4-5 cigars a day "but I don't really inhale."  Vaping Use  . Vaping Use: Never used  Substance Use Topics  . Alcohol use: No    Alcohol/week: 0.0 standard drinks  . Drug use: No    No Known Allergies  Current Meds  Medication Sig  . aspirin EC 81 MG tablet Take 1 tablet (81 mg total) by mouth daily with breakfast.  . atorvastatin (LIPITOR) 80 MG tablet Take 80 mg by mouth daily at 6 PM.  . carvedilol (COREG) 3.125 MG tablet Take 1 tablet (3.125 mg total) by mouth 2 (two) times daily with a meal.  .  cephALEXin (KEFLEX) 500 MG capsule Take 1 capsule (500 mg total) by mouth 3 (three) times daily for 7 days.  . FEROSUL 325 (65 Fe) MG tablet Take 325 mg by mouth daily.  . ferrous sulfate 325 (65 FE) MG EC tablet Take 1 tablet (325 mg total) by mouth 2 (two) times daily.  . megestrol (MEGACE) 400 MG/10ML suspension Take 10 mLs (400 mg total) by mouth daily.  . nitroGLYCERIN (NITRODUR - DOSED IN MG/24 HR) 0.4 mg/hr patch APPLY ONE PATCH TOPICALLY ONCE DAILY (Patient taking differently: Place 0.4 mg onto the skin daily. )  . pantoprazole (PROTONIX) 40 MG tablet Take 1 tablet (40 mg total) by mouth daily.  . QUEtiapine (SEROQUEL) 200 MG tablet Take 200 mg by mouth at bedtime.  Marland Kitchen venlafaxine XR (EFFEXOR-XR) 150 MG 24 hr capsule Take 1 capsule by mouth daily.  . Vitamin D, Ergocalciferol, (DRISDOL) 1.25 MG (50000 UT) CAPS capsule Take 1 capsule (50,000 Units total) by mouth every Sunday.    BP 126/83   Pulse 94   Ht 6' 3.25" (  1.911 m)   Wt 159 lb 8 oz (72.3 kg)   BMI 19.80 kg/m   Physical Exam  Right Hand Exam   Other  Scars: present Sensation: normal Pulse: present  Comments:  There is a wound on the volar aspect of the right ring finger appears to be a skin tear  Swelling of the PIP joint of the right ring finger decreased range of motion alignment is normal  No crepitance on range of motion      MEDICAL DECISION MAKING  A.  Encounter Diagnosis  Name Primary?  . Closed dislocation of interphalangeal joint of right ring finger PIP 11/04/19 Yes    B. DATA ANALYSED:    IMAGING: Independent interpretation of images: 2 sets of x-rays the first x-ray shows a dislocation of the PIP joint of the right ring finger which is a dorsal dislocation, no fracture is seen.  Then the reduction is congruent on the second set of x-rays  Orders:  Outside records reviewed: Emergency room records  C. MANAGEMENT   Dressings were placed on the elbow abrasions there were superficial skin  tears  Dressing placed on the volar aspect of the ring finger and the 2 fingers long and ring were taped together patient is encouraged to actively move the fingers  X-ray in 2 weeks  No orders of the defined types were placed in this encounter.     Arther Abbott, MD  11/08/2019 10:39 AM

## 2019-11-08 NOTE — Patient Instructions (Signed)

## 2019-11-20 ENCOUNTER — Ambulatory Visit: Payer: Medicare Other | Admitting: Diagnostic Neuroimaging

## 2019-11-21 ENCOUNTER — Encounter: Payer: Self-pay | Admitting: Neurology

## 2019-11-21 ENCOUNTER — Ambulatory Visit (INDEPENDENT_AMBULATORY_CARE_PROVIDER_SITE_OTHER): Payer: Medicare Other | Admitting: Neurology

## 2019-11-21 ENCOUNTER — Other Ambulatory Visit: Payer: Self-pay

## 2019-11-21 VITALS — BP 138/83 | HR 76 | Ht 75.25 in | Wt 162.0 lb

## 2019-11-21 DIAGNOSIS — R269 Unspecified abnormalities of gait and mobility: Secondary | ICD-10-CM | POA: Diagnosis not present

## 2019-11-21 DIAGNOSIS — F0391 Unspecified dementia with behavioral disturbance: Secondary | ICD-10-CM

## 2019-11-21 DIAGNOSIS — F03918 Unspecified dementia, unspecified severity, with other behavioral disturbance: Secondary | ICD-10-CM | POA: Insufficient documentation

## 2019-11-21 MED ORDER — QUETIAPINE FUMARATE 25 MG PO TABS: 50.0000 mg | ORAL_TABLET | Freq: Two times a day (BID) | ORAL | 11 refills | Status: DC

## 2019-11-21 MED ORDER — DONEPEZIL HCL 10 MG PO TABS: 10.0000 mg | ORAL_TABLET | Freq: Every day | ORAL | 11 refills | Status: AC

## 2019-11-21 NOTE — Patient Instructions (Signed)
Stop Seroquel 200mg  every night, change to 25mg  2 tablets twice a day, may take one extra seroquel 25mg  as needed for agitation

## 2019-11-21 NOTE — Progress Notes (Signed)
HISTORICAL  Edward Trevino is a 86 year old male, seen in request by his primary care physician Dr. Huel Cote, [name] for evaluation of dementia, he is accompanied by his caregiver Edward Trevino at today's visit on November 21, 2019, Edward Trevino has taking care of him for 6 years now, since 2015, she sees him 6 hours each day, 5 days each week.  I reviewed and summarized the referring note. He had a past medical history of hypertension, Hyperlipidemia Coronary artery disease Suicidal attempts per record  He is retired Administrator, has 5 sons, is power of attorney, one of his son Edward Trevino lives on the same property as he was, but per his caregiver Edward Trevino, was not really involved in patient care because of his personal issues.  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  But he is no longer able to take care of his dog, cannot fix meal for himself, increased gait abnormality, sometimes bowel and bladder incontinence.  Patient did not provide any history, but cursed occasionally during conversation, apparently has visual hallucinations,  Edward Trevino does report patient has gradual worsening agitations, his family supposed to administer medication at nighttime, including Seroquel 200 mg every night, but sometimes the pill still left on his table, Edward Trevino also described that in the morning time, sometimes he become agitated,      I personally reviewed CT head without contrast in June 2021, no acute intracranial abnormality, soft tissue contusion on the left forehead  REVIEW OF SYSTEMS: Full 14 system review of systems performed and notable only for as  above All other review of systems were negative.  ALLERGIES: No Known Allergies  HOME MEDICATIONS: Current Outpatient Medications  Medication Sig Dispense Refill  . aspirin EC 81 MG tablet Take 1 tablet (81 mg total) by mouth daily with breakfast. 30 tablet 2  . atorvastatin (LIPITOR) 80 MG tablet Take 80 mg by mouth daily at 6 PM.    . carvedilol (COREG) 3.125 MG tablet Take 1 tablet (3.125 mg total) by mouth 2 (two) times daily with a meal. 60 tablet 3  . FEROSUL 325 (65 Fe) MG tablet Take 325 mg by mouth daily.    . ferrous sulfate 325 (65 FE) MG EC tablet Take 1 tablet (325 mg total) by mouth 2 (two) times daily. 60 tablet 3  . megestrol (MEGACE) 400 MG/10ML suspension Take 10 mLs (400 mg total) by mouth daily. 480 mL 2  . nitroGLYCERIN (NITRODUR - DOSED IN MG/24 HR) 0.4 mg/hr patch APPLY ONE PATCH TOPICALLY ONCE DAILY (Patient taking differently: Place 0.4 mg onto the skin daily. ) 30 patch 12  . pantoprazole (PROTONIX) 40 MG tablet Take 1 tablet (40 mg total) by mouth daily. 30 tablet 2  . QUEtiapine (SEROQUEL) 200 MG tablet Take 200 mg by mouth at bedtime.    Marland Kitchen venlafaxine XR (EFFEXOR-XR) 150 MG 24 hr capsule Take 1 capsule by mouth daily.    . Vitamin D, Ergocalciferol, (DRISDOL) 1.25 MG (50000 UT) CAPS capsule Take 1 capsule (50,000 Units total) by mouth every Sunday. 5 capsule 2  No current facility-administered medications for this visit.    PAST MEDICAL HISTORY: Past Medical History:  Diagnosis Date  . Anxiety   . Bladder stones 05/22/2015  . Choledocholithiasis with obstruction 07/25/2012  . Dementia ([place])   . Depression   . Duodenitis 05/11/15  . Erosive esophagitis   . Esophageal stricture 05/11/15   Dilated  . Gastric out let obstruction   . Gastric outlet obstruction 05/07/2015  . HOH (hard of hearing)   . Insomnia   . Ischemic cardiomyopathy    LVEF 20%  . Multiple duodenal ulcers 05/11/15   2  were present per EGD.  . ST elevation myocardial infarction  (STEMI) of anterior wall [facility])    October 2016 - late presentation, managed conservatively  . Suicide attempt [facility])     PAST SURGICAL HISTORY: Past Surgical History:  Procedure Laterality Date  . APPENDECTOMY    . BACK SURGERY    . BALLOON DILATION N/A 07/24/2012   Procedure: BALLOON DILATION;  Surgeon: Daneil Dolin, MD;  Location: [facility];  Service: Endoscopy;  Laterality: N/A;  Balloon Stone Extraction, Drudging  . BIOPSY N/A 07/24/2012   Procedure: BIOPSY;  Surgeon: Daneil Dolin, MD;  Location: [facility];  Service: Endoscopy;  Laterality: N/A;  Duodenal and Esophageal Biopsies  . BIOPSY N/A 10/18/2012   Procedure: BIOPSY;  Surgeon: Daneil Dolin, MD;  Location: [facility];  Service: Endoscopy;  Laterality: N/A;  . CHOLECYSTECTOMY N/A 10/26/2012   Procedure: LAPAROSCOPIC CHOLECYSTECTOMY;  Surgeon: Jamesetta So, MD;  Location: [facility];  Service: General;  Laterality: N/A;  . EGD/ERCP  10/18/2012   Dr. Gala Romney: ;yloric channel stenosis, s/p dilation and bx, persisting CBD stones with extension of sphincterotomy and sphincterotomy balloon dilation and stone extraction. Removal of biliary stent  . ERCP N/A 07/24/2012   Dr. Gala Romney. Significant abnormalities of the bowl and proximal second portion producing partial gastric outlet stricture and with secondary gastric dilation and severe erosive reflux esophagitis. Biopsy showed benign ulceration. Normal-appearing ampulla, status post biliary sphincterotomy and ampulla balloon dilation, stone extraction and stent placement.  Marland Kitchen ERCP N/A 10/18/2012   Procedure: ENDOSCOPIC RETROGRADE CHOLANGIOPANCREATOGRAPHY (ERCP);  Surgeon: Daneil Dolin, MD;  Location: [facility];  Service: Endoscopy;  Laterality: N/A;  . ESOPHAGEAL DILATION N/A 05/15/2015   Procedure: ESOPHAGEAL DILATION;  Surgeon: Danie Binder, MD;  [facility];  Service: Endoscopy;  Laterality: N/A;  . ESOPHAGOGASTRODUODENOSCOPY N/A 07/24/2012   NOB:[id number] ampulla s/p biliary  sphincterotomy and ampullary  . ESOPHAGOGASTRODUODENOSCOPY N/A 05/07/2015   RMR: Severe esophagitis, retained food in the stomach precluded complete exam, pyloric channel/duodenal bulb abnormal with 1 cm ulcer, friability with high-grade gastric outlet obstruction. No dilation performed on Plavix and aspirin.  . ESOPHAGOGASTRODUODENOSCOPY N/A 05/11/2015   Rehman: Distal esophageal ulcers with circumferential ulceration at distal 3-4 cm, soft stricture dilated with scope, diffuse erythema and edema in the bulbar mucosa with 2 small ulcers in the distal segment and high-grade stricture in the middle, scope could not be passed. Dilated up to 12 mm. Could not see duodenum beyond this point because the scope was retained in the large stomach.  . ESOPHAGOGASTRODUODENOSCOPY N/A 05/15/2015   SLF: Esophagitis with stricture, dilated GE junction stricture up to 14 mm with the balloon, D1/D2 stricture with narrowing of the lumen to 10-11 mm, dilated up to 15 mm with balloon. Downstream duodenum appear normal. Small duodenal bulbar ulcer. Biopsies from the stomach benign gastritis, no H pylori.  . ESOPHAGOGASTRODUODENOSCOPY (EGD) WITH  PROPOFOL N/A 10/18/2012   IWP:[id number] channel stenosis s/p dilation/Persisting common bile duct stones with extension of sphincterotomy     and sphincterotomy balloon dilation and stone extraction.  Biliary stent removal   . LAPAROSCOPY N/A 10/31/2012   Procedure: LAPAROSCOPY DIAGNOSTIC;  Surgeon: Donato Heinz, MD;  Location: [facility];  Service: General;  Laterality: N/A;  . REMOVAL OF STONES N/A 07/24/2012   Procedure: REMOVAL OF STONES;  Surgeon: Daneil Dolin, MD;  Location: [facility];  Service: Endoscopy;  Laterality: N/A;  . REMOVAL OF STONES N/A 10/18/2012   Procedure: REMOVAL OF STONES;  Surgeon: Daneil Dolin, MD;  Location: [facility];  Service: Endoscopy;  Laterality: N/A;  . SPHINCTEROTOMY N/A 07/24/2012   Procedure: SPHINCTEROTOMY;  Surgeon: Daneil Dolin, MD;  Location: [facility];   Service: Endoscopy;  Laterality: N/A;    FAMILY HISTORY: Family History  Problem Relation Age of Onset  . Depression Sister   . Other Mother        unsure of history  . Other Father        never had relationship w/ father  . Colon cancer Neg Hx   . Heart disease Neg Hx     SOCIAL HISTORY: Social History   Socioeconomic History  . Marital status: Single    Spouse name: Not on file  . Number of children: 5  . Years of education: 60  . Highest education level: High school graduate  Occupational History  . Occupation: Retired  Tobacco Use  . Smoking status: Current Every Day Smoker    Types: Cigars  . Smokeless tobacco: Never Used  . Tobacco comment: Smokes 4-5 cigars a day "but I don't really inhale."  Vaping Use  . Vaping Use: Never used  Substance and Sexual Activity  . Alcohol use: No    Alcohol/week: 0.0 standard drinks  . Drug use: Never  . Sexual activity: Never  Other Topics Concern  . Not on file  Social History Narrative   He lives alone. He has caregiver from Monday-Friday 9-3. He has family that lives on the same property who look in on him at other times.   Right-handed.   Four cups coffee per day.   Social Determinants of Health   Financial Resource Strain:   . Difficulty of Paying Living Expenses:   Food Insecurity:   . Worried About Charity fundraiser in the Last Year:   . Arboriculturist in the Last Year:   Transportation Needs:   . Film/video editor (Medical):   Marland Kitchen Lack of Transportation (Non-Medical):   Physical Activity:   . Days of Exercise per Week:   . Minutes of Exercise per Session:   Stress:   . Feeling of Stress :   Social Connections:   . Frequency of Communication with Friends and Family:   . Frequency of Social Gatherings with Friends and Family:   . Attends Religious Services:   . Active Member of Clubs or Organizations:   . Attends Archivist Meetings:   Marland Kitchen Marital Status:   Intimate Partner Violence:   .  Fear of Current or Ex-Partner:   . Emotionally Abused:   Marland Kitchen Physically Abused:   . Sexually Abused:      PHYSICAL EXAM   Vitals:   11/21/19 1406  BP: 138/83  Pulse: 76  Weight: 162 lb (73.5 kg)  Height: 6' 3.25" (1.911 m)   Not recorded     Body mass index  is 20.11 kg/m.  PHYSICAL EXAMNIATION:  Gen: NAD, conversant, well nourised, well groomed                     Cardiovascular: Regular rate rhythm, no peripheral edema, warm, nontender. Eyes: Conjunctivae clear without exudates or hemorrhage Neck: Supple, no carotid bruits. Pulmonary: Clear to auscultation bilaterally   NEUROLOGICAL EXAM:  MENTAL STATUS: Unkept, acquired, no dysarthria, MMSE - Mini Mental State Exam 11/21/2019  Orientation to time 0  Orientation to Place 3  Registration 3  Attention/ Calculation 0  Recall 0  Language- name 2 objects 2  Language- repeat 1  Language- follow 3 step command 3  Language- read & follow direction 1  Write a sentence 0  Copy design 0  Total score 13  Animal naming 1 CRANIAL NERVES: CN II: Visual fields are full to confrontation. Pupils are round equal and briskly reactive to light. CN III, IV, VI: extraocular movement are normal. No ptosis. CN V: Facial sensation is intact to light touch CN VII: Face is symmetric with normal eye closure  CN VIII: Hearing is normal to causal conversation. CN IX, X: Phonation is normal. CN XI: Head turning and shoulder shrug are intact  MOTOR: Not cooperative on examination  REFLEXES: Hypoactive SENSORY: Withdrawal to pain COORDINATION: There is no trunk or limb dysmetria noted.  GAIT/STANCE: He rely on his cane to get up from seated position, wide-based, mildly unsteady   DIAGNOSTIC DATA (LABS, IMAGING, TESTING) - I reviewed patient records, labs, notes, testing and imaging myself where available.   ASSESSMENT AND PLAN  Edward Trevino is a 86 y.o. male    Dementia with agitation  Mini-Mental Status Examination is on  is 35 out of 30,  Laboratory evaluation to rule out treatable etiology  Add on Aricept 10 mg daily, hopefully it will help some of his agitation  He is on large dose of Seroquel 200 mg every night, often missing his pills per his caregiver of long time, will change to smaller dose 25 mg 2 tablets twice a day, for better control of morning time agitation to  Return to clinic in 6 months  Gait Abnormality  He seems to have distal leg weakness, with bowel and bladder incontinence,  Initiation diagnosis include lumbosacral radiculopathy  I have suggested PT, Edward Trevino stated " he has cursed out previous physical therapy"   Marcial Pacas, M.D. Ph.D.  [facility] [address], [place], [place] [address] Ph: [phone] Fax: [phone]  CC:  Leeanne Rio, MD

## 2019-11-22 ENCOUNTER — Ambulatory Visit (INDEPENDENT_AMBULATORY_CARE_PROVIDER_SITE_OTHER): Payer: Medicare Other | Admitting: Orthopedic Surgery

## 2019-11-22 ENCOUNTER — Other Ambulatory Visit: Payer: Self-pay

## 2019-11-22 ENCOUNTER — Telehealth: Payer: Self-pay | Admitting: Neurology

## 2019-11-22 ENCOUNTER — Ambulatory Visit: Payer: Medicare Other

## 2019-11-22 DIAGNOSIS — S63274A Dislocation of unspecified interphalangeal joint of right ring finger, initial encounter: Secondary | ICD-10-CM | POA: Diagnosis not present

## 2019-11-22 LAB — COMPREHENSIVE METABOLIC PANEL
ALT: 9 IU/L (ref 0–44)
AST: 19 IU/L (ref 0–40)
Albumin/Globulin Ratio: 1.4 (ref 1.2–2.2)
Albumin: 4 g/dL (ref 3.6–4.6)
Alkaline Phosphatase: 192 IU/L — ABNORMAL HIGH (ref 48–121)
BUN/Creatinine Ratio: 14 (ref 10–24)
BUN: 14 mg/dL (ref 8–27)
Bilirubin Total: 0.2 mg/dL (ref 0.0–1.2)
CO2: 22 mmol/L (ref 20–29)
Calcium: 8.5 mg/dL — ABNORMAL LOW (ref 8.6–10.2)
Chloride: 105 mmol/L (ref 96–106)
Creatinine, Ser: 1.02 mg/dL (ref 0.76–1.27)
GFR calc Af Amer: 77 mL/min/{1.73_m2} (ref >59)
GFR calc non Af Amer: 67 mL/min/{1.73_m2} (ref >59)
Globulin, Total: 2.8 g/dL (ref 1.5–4.5)
Glucose: 118 mg/dL — ABNORMAL HIGH (ref 65–99)
Potassium: 4.4 mmol/L (ref 3.5–5.2)
Sodium: 140 mmol/L (ref 134–144)
Total Protein: 6.8 g/dL (ref 6.0–8.5)

## 2019-11-22 LAB — VITAMIN B12: Vitamin B-12: 279 pg/mL (ref 232–1245)

## 2019-11-22 LAB — TSH: TSH: 4.87 u[IU]/mL — ABNORMAL HIGH (ref 0.450–4.500)

## 2019-11-22 LAB — RPR: RPR Ser Ql: NONREACTIVE

## 2019-11-22 NOTE — Telephone Encounter (Signed)
Please call patient, laboratory evaluation showed elevated TSH, I have forwarded laboratory results to his primary care physician Dr. Huel Cote, Nicole Kindred, MD, he may contact him for repeat laboratory evaluation and potential treatment for hypothyroidism  Rest of the laboratory evaluation showed no significant abnormalities.

## 2019-11-22 NOTE — Progress Notes (Signed)
Chief Complaint  Patient presents with  . Hand Pain    R/ring finger/doing ok   Recheck right ring finger status post dislocation at the PIP joint  Patient has regained full range of motion all the scratches abrasions and wounds have healed  X-rays show stable congruent reduction  Patient released  Encounter Diagnosis  Name Primary?  . Closed dislocation of interphalangeal joint of right ring finger Yes

## 2019-11-25 NOTE — Telephone Encounter (Signed)
Called the patient, there was no answer. LVM asking for a call to review labs results with the patient.  **When patient returns call please advise that the lab showed elevated thyroid levels but that all other lab results were in normal range. Advise the patient Dr Krista Blue has forwarded the results to his PCP so they may complete further work up.

## 2019-11-27 ENCOUNTER — Inpatient Hospital Stay (HOSPITAL_COMMUNITY)
Admission: EM | Admit: 2019-11-27 | Discharge: 2019-12-05 | DRG: 641 | Disposition: A | Discharge status: Skilled Nursing Facility | Payer: Medicare Other | Attending: Internal Medicine | Admitting: Internal Medicine

## 2019-11-27 ENCOUNTER — Emergency Department (HOSPITAL_COMMUNITY): Payer: Medicare Other

## 2019-11-27 ENCOUNTER — Encounter (HOSPITAL_COMMUNITY): Payer: Self-pay | Admitting: Emergency Medicine

## 2019-11-27 ENCOUNTER — Other Ambulatory Visit: Payer: Self-pay

## 2019-11-27 DIAGNOSIS — R627 Adult failure to thrive: Secondary | ICD-10-CM | POA: Diagnosis not present

## 2019-11-27 DIAGNOSIS — Z515 Encounter for palliative care: Secondary | ICD-10-CM

## 2019-11-27 DIAGNOSIS — Z79899 Other long term (current) drug therapy: Secondary | ICD-10-CM

## 2019-11-27 DIAGNOSIS — I480 Paroxysmal atrial fibrillation: Secondary | ICD-10-CM | POA: Diagnosis present

## 2019-11-27 DIAGNOSIS — R531 Weakness: Secondary | ICD-10-CM | POA: Diagnosis not present

## 2019-11-27 DIAGNOSIS — I5022 Chronic systolic (congestive) heart failure: Secondary | ICD-10-CM | POA: Diagnosis present

## 2019-11-27 DIAGNOSIS — I2510 Atherosclerotic heart disease of native coronary artery without angina pectoris: Secondary | ICD-10-CM | POA: Diagnosis present

## 2019-11-27 DIAGNOSIS — I4891 Unspecified atrial fibrillation: Secondary | ICD-10-CM | POA: Diagnosis present

## 2019-11-27 DIAGNOSIS — H9190 Unspecified hearing loss, unspecified ear: Secondary | ICD-10-CM | POA: Diagnosis present

## 2019-11-27 DIAGNOSIS — F17290 Nicotine dependence, other tobacco product, uncomplicated: Secondary | ICD-10-CM | POA: Diagnosis present

## 2019-11-27 DIAGNOSIS — Z66 Do not resuscitate: Secondary | ICD-10-CM

## 2019-11-27 DIAGNOSIS — F03918 Unspecified dementia, unspecified severity, with other behavioral disturbance: Secondary | ICD-10-CM | POA: Diagnosis present

## 2019-11-27 DIAGNOSIS — I10 Essential (primary) hypertension: Secondary | ICD-10-CM

## 2019-11-27 DIAGNOSIS — Z8711 Personal history of peptic ulcer disease: Secondary | ICD-10-CM

## 2019-11-27 DIAGNOSIS — I255 Ischemic cardiomyopathy: Secondary | ICD-10-CM | POA: Diagnosis present

## 2019-11-27 DIAGNOSIS — F332 Major depressive disorder, recurrent severe without psychotic features: Secondary | ICD-10-CM | POA: Diagnosis present

## 2019-11-27 DIAGNOSIS — I252 Old myocardial infarction: Secondary | ICD-10-CM

## 2019-11-27 DIAGNOSIS — E785 Hyperlipidemia, unspecified: Secondary | ICD-10-CM

## 2019-11-27 DIAGNOSIS — Z681 Body mass index (BMI) 19 or less, adult: Secondary | ICD-10-CM

## 2019-11-27 DIAGNOSIS — Z20822 Contact with and (suspected) exposure to covid-19: Secondary | ICD-10-CM | POA: Diagnosis present

## 2019-11-27 DIAGNOSIS — I444 Left anterior fascicular block: Secondary | ICD-10-CM | POA: Diagnosis present

## 2019-11-27 DIAGNOSIS — Z818 Family history of other mental and behavioral disorders: Secondary | ICD-10-CM

## 2019-11-27 DIAGNOSIS — I110 Hypertensive heart disease with heart failure: Secondary | ICD-10-CM | POA: Diagnosis present

## 2019-11-27 DIAGNOSIS — F0391 Unspecified dementia with behavioral disturbance: Secondary | ICD-10-CM | POA: Diagnosis present

## 2019-11-27 DIAGNOSIS — I493 Ventricular premature depolarization: Secondary | ICD-10-CM | POA: Diagnosis present

## 2019-11-27 DIAGNOSIS — Z7189 Other specified counseling: Secondary | ICD-10-CM

## 2019-11-27 DIAGNOSIS — Z7982 Long term (current) use of aspirin: Secondary | ICD-10-CM

## 2019-11-27 LAB — CBC WITH DIFFERENTIAL/PLATELET
Abs Immature Granulocytes: 0.08 10*3/uL — ABNORMAL HIGH (ref 0.00–0.07)
Basophils Absolute: 0.1 10*3/uL (ref 0.0–0.1)
Basophils Relative: 1 %
Eosinophils Absolute: 0 10*3/uL (ref 0.0–0.5)
Eosinophils Relative: 1 %
HCT: 43.3 % (ref 39.0–52.0)
Hemoglobin: 14.4 g/dL (ref 13.0–17.0)
Immature Granulocytes: 1 %
Lymphocytes Relative: 18 %
Lymphs Abs: 1.6 10*3/uL (ref 0.7–4.0)
MCH: 33 pg (ref 26.0–34.0)
MCHC: 33.3 g/dL (ref 30.0–36.0)
MCV: 99.3 fL (ref 80.0–100.0)
Monocytes Absolute: 0.8 10*3/uL (ref 0.1–1.0)
Monocytes Relative: 9 %
Neutro Abs: 6.2 10*3/uL (ref 1.7–7.7)
Neutrophils Relative %: 70 %
Platelets: 282 10*3/uL (ref 150–400)
RBC: 4.36 MIL/uL (ref 4.22–5.81)
RDW: 12.6 % (ref 11.5–15.5)
WBC: 8.7 10*3/uL (ref 4.0–10.5)
nRBC: 0 % (ref 0.0–0.2)

## 2019-11-27 LAB — COMPREHENSIVE METABOLIC PANEL
ALT: 16 U/L (ref 0–44)
AST: 22 U/L (ref 15–41)
Albumin: 4.1 g/dL (ref 3.5–5.0)
Alkaline Phosphatase: 157 U/L — ABNORMAL HIGH (ref 38–126)
Anion gap: 13 (ref 5–15)
BUN: 29 mg/dL — ABNORMAL HIGH (ref 8–23)
CO2: 23 mmol/L (ref 22–32)
Calcium: 9.3 mg/dL (ref 8.9–10.3)
Chloride: 108 mmol/L (ref 98–111)
Creatinine, Ser: 1.16 mg/dL (ref 0.61–1.24)
GFR calc Af Amer: >60 mL/min (ref >60)
GFR calc non Af Amer: 57 mL/min — ABNORMAL LOW (ref >60)
Glucose, Bld: 104 mg/dL — ABNORMAL HIGH (ref 70–99)
Potassium: 4 mmol/L (ref 3.5–5.1)
Sodium: 144 mmol/L (ref 135–145)
Total Bilirubin: 0.9 mg/dL (ref 0.3–1.2)
Total Protein: 7.8 g/dL (ref 6.5–8.1)

## 2019-11-27 LAB — URINALYSIS, ROUTINE W REFLEX MICROSCOPIC
Bacteria, UA: NONE SEEN
Bilirubin Urine: NEGATIVE
Glucose, UA: NEGATIVE mg/dL
Ketones, ur: 5 mg/dL — AB
Leukocytes,Ua: NEGATIVE
Nitrite: NEGATIVE
Protein, ur: 30 mg/dL — AB
Specific Gravity, Urine: 1.026 (ref 1.005–1.030)
pH: 5 (ref 5.0–8.0)

## 2019-11-27 LAB — LACTIC ACID, PLASMA: Lactic Acid, Venous: 1.8 mmol/L (ref 0.5–1.9)

## 2019-11-27 LAB — SARS CORONAVIRUS 2 BY RT PCR (HOSPITAL ORDER, PERFORMED IN ~~LOC~~ HOSPITAL LAB): SARS Coronavirus 2: NEGATIVE

## 2019-11-27 MED ORDER — QUETIAPINE FUMARATE 25 MG PO TABS
50.0000 mg | ORAL_TABLET | Freq: Every day | ORAL | Status: DC
  Administered 2019-11-27 – 2019-12-05 (×9): 50 mg via ORAL
  Filled 2019-11-27 (×9): qty 2

## 2019-11-27 MED ORDER — CARVEDILOL 3.125 MG PO TABS
3.1250 mg | ORAL_TABLET | Freq: Two times a day (BID) | ORAL | Status: DC
  Administered 2019-11-28 – 2019-12-02 (×8): 3.125 mg via ORAL
  Filled 2019-11-27 (×11): qty 1

## 2019-11-27 MED ORDER — ENOXAPARIN SODIUM 40 MG/0.4ML ~~LOC~~ SOLN
40.0000 mg | SUBCUTANEOUS | Status: DC
  Administered 2019-11-27 – 2019-12-04 (×8): 40 mg via SUBCUTANEOUS
  Filled 2019-11-27 (×8): qty 0.4

## 2019-11-27 MED ORDER — ACETAMINOPHEN 325 MG PO TABS
650.0000 mg | ORAL_TABLET | Freq: Four times a day (QID) | ORAL | Status: DC | PRN
  Administered 2019-11-29 – 2019-12-03 (×4): 650 mg via ORAL
  Filled 2019-11-27 (×4): qty 2

## 2019-11-27 MED ORDER — ENSURE ENLIVE PO LIQD
237.0000 mL | Freq: Two times a day (BID) | ORAL | Status: DC
  Administered 2019-11-28 – 2019-12-05 (×13): 237 mL via ORAL

## 2019-11-27 MED ORDER — SODIUM CHLORIDE 0.9 % IV SOLN: INTRAVENOUS | Status: AC

## 2019-11-27 MED ORDER — ONDANSETRON HCL 4 MG/2ML IJ SOLN: 4.0000 mg | Freq: Four times a day (QID) | INTRAMUSCULAR | Status: DC | PRN

## 2019-11-27 MED ORDER — SODIUM CHLORIDE 0.9 % IV BOLUS
500.0000 mL | Freq: Once | INTRAVENOUS | Status: AC
  Administered 2019-11-27: 500 mL via INTRAVENOUS

## 2019-11-27 MED ORDER — POLYETHYLENE GLYCOL 3350 17 G PO PACK: 17.0000 g | PACK | Freq: Every day | ORAL | Status: DC | PRN

## 2019-11-27 MED ORDER — ATORVASTATIN CALCIUM 40 MG PO TABS
80.0000 mg | ORAL_TABLET | Freq: Every day | ORAL | Status: DC
  Administered 2019-11-28 – 2019-12-05 (×7): 80 mg via ORAL
  Filled 2019-11-27 (×7): qty 2

## 2019-11-27 MED ORDER — DONEPEZIL HCL 5 MG PO TABS
10.0000 mg | ORAL_TABLET | Freq: Every day | ORAL | Status: DC
  Administered 2019-11-27 – 2019-12-04 (×8): 10 mg via ORAL
  Filled 2019-11-27 (×8): qty 2

## 2019-11-27 MED ORDER — ONDANSETRON HCL 4 MG PO TABS: 4.0000 mg | ORAL_TABLET | Freq: Four times a day (QID) | ORAL | Status: DC | PRN

## 2019-11-27 MED ORDER — ACETAMINOPHEN 650 MG RE SUPP: 650.0000 mg | Freq: Four times a day (QID) | RECTAL | Status: DC | PRN

## 2019-11-27 MED ORDER — ASPIRIN EC 81 MG PO TBEC
81.0000 mg | DELAYED_RELEASE_TABLET | Freq: Every day | ORAL | Status: DC
  Administered 2019-11-28 – 2019-12-05 (×8): 81 mg via ORAL
  Filled 2019-11-27 (×8): qty 1

## 2019-11-27 MED ORDER — PANTOPRAZOLE SODIUM 40 MG PO TBEC
40.0000 mg | DELAYED_RELEASE_TABLET | Freq: Every day | ORAL | Status: DC
  Administered 2019-11-27 – 2019-12-05 (×9): 40 mg via ORAL
  Filled 2019-11-27 (×9): qty 1

## 2019-11-27 NOTE — H&P (Signed)
History and Physical    Edward Trevino KGU:[id number] DOB: 1931/08/18 DOA: 11/27/2019  PCP: Leeanne Rio, MD   Patient coming from: Home  I have personally briefly reviewed patient's old medical records in [place]  Chief Complaint: Generalized weakness, not eating.  HPI: Edward Trevino is a 86 y.o. male with medical history significant for coronary artery disease, cardiomyopathy, atrial fibrillation, dementia, depression. Patient was brought to the ED with complaints of generalized weakness, and not been able to eat x 4 days, also reports that over the past 3 days patient has not been able to ambulate.  Per intake notes patient also had diarrhea x 2 weeks. Patient also refusing to take his medications.  Patient lives alone, but has a paid caregiver. At the time of my evaluation patient is awake alert oriented, is able to answer simple questions appropriately.  He tells me he just did not feel like eating anything.  He endorses pain with it.  He denies pain.  He is not short of breath has no cough has no pain with urination.  He denies vomiting, he denies loose stools to me.  ED Course: Stable vitals.  Sodium 144.  Otherwise unremarkable CBC CMP.  UA not suggestive of infection.  Normal lactic acid 1.8.  Portable chest x-ray without acute abnormality.  EKG shows PVCs.  500 mill bolus given in ED.  In the ED patient was unable to ambulate, but was able to stand for about 30 seconds with two person assist.  Hospitalist to admit for further evaluation and management.  Review of Systems: As per HPI all other systems reviewed and negative.  Past Medical History:  Diagnosis Date  . Anxiety   . Bladder stones 05/22/2015  . Choledocholithiasis with obstruction 07/25/2012  . Dementia ([place])   . Depression   . Duodenitis 05/11/15  . Erosive esophagitis   . Esophageal stricture 05/11/15   Dilated  . Gastric out let obstruction   . Gastric outlet obstruction 05/07/2015  . HOH (hard of  hearing)   . Insomnia   . Ischemic cardiomyopathy    LVEF 20%  . Multiple duodenal ulcers 05/11/15   2  were present per EGD.  . ST elevation myocardial infarction (STEMI) of anterior wall [facility])    October 2016 - late presentation, managed conservatively  . Suicide attempt [facility])     Past Surgical History:  Procedure Laterality Date  . APPENDECTOMY    . BACK SURGERY    . BALLOON DILATION N/A 07/24/2012   Procedure: BALLOON DILATION;  Surgeon: Daneil Dolin, MD;  Location: [facility];  Service: Endoscopy;  Laterality: N/A;  Balloon Stone Extraction, Drudging  . BIOPSY N/A 07/24/2012   Procedure: BIOPSY;  Surgeon: Daneil Dolin, MD;  Location: [facility];  Service: Endoscopy;  Laterality: N/A;  Duodenal and Esophageal Biopsies  . BIOPSY N/A 10/18/2012   Procedure: BIOPSY;  Surgeon: Daneil Dolin, MD;  Location: [facility];  Service: Endoscopy;  Laterality: N/A;  . CHOLECYSTECTOMY N/A 10/26/2012   Procedure: LAPAROSCOPIC CHOLECYSTECTOMY;  Surgeon: Jamesetta So, MD;  Location: [facility];  Service: General;  Laterality: N/A;  . EGD/ERCP  10/18/2012   Dr. Gala Romney: ;yloric channel stenosis, s/p dilation and bx, persisting CBD stones with extension of sphincterotomy and sphincterotomy balloon dilation and stone extraction. Removal of biliary stent  . ERCP N/A 07/24/2012   Dr. Gala Romney. Significant abnormalities of the bowl and proximal second portion producing partial gastric outlet stricture and with secondary  gastric dilation and severe erosive reflux esophagitis. Biopsy showed benign ulceration. Normal-appearing ampulla, status post biliary sphincterotomy and ampulla balloon dilation, stone extraction and stent placement.  Marland Kitchen ERCP N/A 10/18/2012   Procedure: ENDOSCOPIC RETROGRADE CHOLANGIOPANCREATOGRAPHY (ERCP);  Surgeon: Daneil Dolin, MD;  Location: [facility];  Service: Endoscopy;  Laterality: N/A;  . ESOPHAGEAL DILATION N/A 05/15/2015   Procedure: ESOPHAGEAL DILATION;  Surgeon: Danie Binder, MD;  Location: [facility];  Service: Endoscopy;  Laterality: N/A;  . ESOPHAGOGASTRODUODENOSCOPY N/A 07/24/2012   BSW:[id number] ampulla s/p biliary sphincterotomy and ampullary  . ESOPHAGOGASTRODUODENOSCOPY N/A 05/07/2015   RMR: Severe esophagitis, retained food in the stomach precluded complete exam, pyloric channel/duodenal bulb abnormal with 1 cm ulcer, friability with high-grade gastric outlet obstruction. No dilation performed on Plavix and aspirin.  . ESOPHAGOGASTRODUODENOSCOPY N/A 05/11/2015   Rehman: Distal esophageal ulcers with circumferential ulceration at distal 3-4 cm, soft stricture dilated with scope, diffuse erythema and edema in the bulbar mucosa with 2 small ulcers in the distal segment and high-grade stricture in the middle, scope could not be passed. Dilated up to 12 mm. Could not see duodenum beyond this point because the scope was retained in the large stomach.  . ESOPHAGOGASTRODUODENOSCOPY N/A 05/15/2015   SLF: Esophagitis with stricture, dilated GE junction stricture up to 14 mm with the balloon, D1/D2 stricture with narrowing of the lumen to 10-11 mm, dilated up to 15 mm with balloon. Downstream duodenum appear normal. Small duodenal bulbar ulcer. Biopsies from the stomach benign gastritis, no H pylori.  . ESOPHAGOGASTRODUODENOSCOPY (EGD) WITH PROPOFOL N/A 10/18/2012   JTT:[id number] channel stenosis s/p dilation/Persisting common bile duct stones with extension of sphincterotomy     and sphincterotomy balloon dilation and stone extraction.  Biliary stent removal   . LAPAROSCOPY N/A 10/31/2012   Procedure: LAPAROSCOPY DIAGNOSTIC;  Surgeon: Donato Heinz, MD;  Location: [facility];  Service: General;  Laterality: N/A;  . REMOVAL OF STONES N/A 07/24/2012   Procedure: REMOVAL OF STONES;  Surgeon: Daneil Dolin, MD;  Location: [facility];  Service: Endoscopy;  Laterality: N/A;  . REMOVAL OF STONES N/A 10/18/2012   Procedure: REMOVAL OF STONES;  Surgeon: Daneil Dolin, MD;  Location: [facility];  Service:  Endoscopy;  Laterality: N/A;  . SPHINCTEROTOMY N/A 07/24/2012   Procedure: SPHINCTEROTOMY;  Surgeon: Daneil Dolin, MD;  Location: [facility];  Service: Endoscopy;  Laterality: N/A;     reports that he has been smoking cigars. He has never used smokeless tobacco. He reports that he does not drink alcohol and does not use drugs.  No Known Allergies  Family History  Problem Relation Age of Onset  . Depression Sister   . Other Mother        unsure of history  . Other Father        never had relationship w/ father  . Colon cancer Neg Hx   . Heart disease Neg Hx     Prior to Admission medications   Medication Sig Start Date End Date Taking? Authorizing Provider  aspirin EC 81 MG tablet Take 1 tablet (81 mg total) by mouth daily with breakfast. 12/21/18  Yes Chezney Huether, [place], MD  atorvastatin (LIPITOR) 80 MG tablet Take 80 mg by mouth daily at 6 PM.   Yes [provider]  carvedilol (COREG) 3.125 MG tablet Take 1 tablet (3.125 mg total) by mouth 2 (two) times daily with a meal. 12/21/18  Yes Zaiya Annunziato, Courage, MD  donepezil (ARICEPT) 10 MG tablet Take  1 tablet (10 mg total) by mouth at bedtime. 11/21/19  Yes Marcial Pacas, MD  ferrous sulfate 325 (65 FE) MG EC tablet Take 1 tablet (325 mg total) by mouth 2 (two) times daily. 12/21/18  Yes Kathline Banbury, Courage, MD  nitroGLYCERIN (NITRODUR - DOSED IN MG/24 HR) 0.4 mg/hr patch APPLY ONE PATCH TOPICALLY ONCE DAILY Patient taking differently: Place 0.4 mg onto the skin daily.  06/16/17  Yes Fay Records, MD  pantoprazole (PROTONIX) 40 MG tablet Take 1 tablet (40 mg total) by mouth daily. 12/21/18  Yes Navi Erber, Courage, MD  QUEtiapine (SEROQUEL) 25 MG tablet Take 2 tablets (50 mg total) by mouth 2 (two) times daily. Patient taking differently: Take 25-75 mg by mouth daily.  11/21/19  Yes Marcial Pacas, MD  Vitamin D, Ergocalciferol, (DRISDOL) 1.25 MG (50000 UT) CAPS capsule Take 1 capsule (50,000 Units total) by mouth every Sunday. 12/23/18   Roxan Hockey, MD     Physical Exam: Vitals:   11/27/19 1440 11/27/19 1450 11/27/19 1500 11/27/19 1530  BP:   (!) 139/59 133/82  Pulse: 79 83 (!) 34   Resp: 16 (!) 22 17 19   Temp:      TempSrc:      SpO2: 99% 100% 100%   Weight:      Height:        Constitutional: NAD, calm, comfortable Vitals:   11/27/19 1440 11/27/19 1450 11/27/19 1500 11/27/19 1530  BP:   (!) 139/59 133/82  Pulse: 79 83 (!) 34   Resp: 16 (!) 22 17 19   Temp:      TempSrc:      SpO2: 99% 100% 100%   Weight:      Height:       Eyes: PERRL, lids and conjunctivae normal ENMT: Currently having an ice cream , mucous membranes are moist.  Neck: normal, supple, no masses, no thyromegaly Respiratory: clear to auscultation bilaterally, no wheezing, no crackles. Normal respiratory effort. No accessory muscle use.  Cardiovascular: Regular rate and rhythm, no murmurs / rubs / gallops. No extremity edema. 2+ pedal pulses.  Abdomen: no tenderness, no masses palpated. No hepatosplenomegaly. Bowel sounds positive.  Musculoskeletal: no clubbing / cyanosis. No joint deformity upper and lower extremities. Good ROM, no contractures. Normal muscle tone.  Skin: no rashes, lesions, ulcers. No induration Neurologic: 4+/5 strength bilateral upper and lower extremities.  No apparent cranial nerve abnormality. Psychiatric: Normal judgment and insight. Alert and oriented x 3. Normal mood.   Labs on Admission: I have personally reviewed following labs and imaging studies  CBC: Recent Labs  Lab 11/27/19 1238  WBC 8.7  NEUTROABS 6.2  HGB 14.4  HCT 43.3  MCV 99.3  PLT 397   Basic Metabolic Panel: Recent Labs  Lab 11/21/19 1516 11/27/19 1238  NA 140 144  K 4.4 4.0  CL 105 108  CO2 22 23  GLUCOSE 118* 104*  BUN 14 29*  CREATININE 1.02 1.16  CALCIUM 8.5* 9.3   Liver Function Tests: Recent Labs  Lab 11/21/19 1516 11/27/19 1238  AST 19 22  ALT 9 16  ALKPHOS 192* 157*  BILITOT 0.2 0.9  PROT 6.8 7.8  ALBUMIN 4.0 4.1   Urine  analysis:    Component Value Date/Time   COLORURINE YELLOW 11/27/2019 1342   APPEARANCEUR CLEAR 11/27/2019 1342   LABSPEC 1.026 11/27/2019 1342   PHURINE 5.0 11/27/2019 1342   GLUCOSEU NEGATIVE 11/27/2019 1342   [place] (A) 11/27/2019 1342   BILIRUBINUR NEGATIVE 11/27/2019 1342  KETONESUR 5 (A) 11/27/2019 1342   PROTEINUR 30 (A) 11/27/2019 1342   UROBILINOGEN 0.2 12/24/2012 1057   NITRITE NEGATIVE 11/27/2019 1342   LEUKOCYTESUR NEGATIVE 11/27/2019 1342    Radiological Exams on Admission: DG Chest Port 1 View  Result Date: 11/27/2019 CLINICAL DATA:  Weakness, loss of appetite EXAM: PORTABLE CHEST 1 VIEW COMPARISON:  03/28/2019 FINDINGS: There is hyperinflation of the lungs compatible with COPD. Biapical scarring. Heart is normal size. No acute confluent airspace opacities or effusions. No acute bony abnormality. IMPRESSION: COPD/chronic changes.  No active disease. Electronically Signed   By: Rolm Baptise M.D.   On: 11/27/2019 12:47    EKG: Independently reviewed.  Sinus rhythm QTc 455.  PVCs present.  LAFB.  No significant change from prior.  Assessment/Plan Principal Problem:   Failure to thrive in adult Active Problems:   MDD (major depressive disorder), recurrent severe, without psychosis (HCC)   Chronic systolic CHF (congestive heart failure) (HCC)   PAFib/Atrial fibrillation   Dementia with behavioral disturbance (HCC)   Failure to thrive-patient not eating, generalized weakness, unable to ambulate, requiring 2 person assist to stand.  Patient has paid caregiver, has a camera in the house, family lives in the vicinity, but patient lives alone. -Nutritional supplements Ensure -IV fluids normal saline 75 cc/h x 12 hours -PT evaluation, social work consult, patient would benefit from placement  Depression, dementia -Resume Seroquel, donepezil.  Ischemic cardiomyopathy -stable.  EF as low as 20% in the past.  Per last echo 12/2018 EF 55 to 60% with impaired LV  diastolic relaxation. -Resume aspirin, statins, carvedilol.     DVT prophylaxis: Lovenox. Code Status: Full code. Family Communication: None at bedside. Disposition Plan: Pending PT and social work evaluation. Consults called: None. Admission status: Observation, MedSurg.   Bethena Roys MD [facility]  11/27/2019, 7:16 PM

## 2019-11-27 NOTE — ED Provider Notes (Signed)
[place] Provider Note   CSN: [id number] Arrival date & time: 11/27/19  1029     History Chief Complaint  Patient presents with  . Weakness    Edward Trevino is a 86 y.o. male with a history of dementia, ischemic cardiomyopathy with CHF, ejection fraction 20%, history of STEMI, history of duodenal ulcers, paroxysmal A. Fib who lives at home but has a daily caregiver and family that lives on the same property presenting with a 5-day history of anorexia along with increasing weakness.  Caregiver at the bedside gives majority of patient's recent history.  She also notes that he has had diarrhea which started 2 days ago, multiple episodes of nonbloody diarrhea.  No known fevers or chills, no no nausea, no vomiting.  He does have urinary incontinence at baseline.  He has been unable to ambulate for the past 3 days secondary to generalized weakness and is also refusing to take his home medications.  Other negatives include no cough, he denies chest pain or shortness of breath.  Denies headache.    Level 5 caveat secondary to dementia.  HPI     Past Medical History:  Diagnosis Date  . Anxiety   . Bladder stones 05/22/2015  . Choledocholithiasis with obstruction 07/25/2012  . Dementia ([place])   . Depression   . Duodenitis 05/11/15  . Erosive esophagitis   . Esophageal stricture 05/11/15   Dilated  . Gastric out let obstruction   . Gastric outlet obstruction 05/07/2015  . HOH (hard of hearing)   . Insomnia   . Ischemic cardiomyopathy    LVEF 20%  . Multiple duodenal ulcers 05/11/15   2  were present per EGD.  . ST elevation myocardial infarction (STEMI) of anterior wall [facility])    October 2016 - late presentation, managed conservatively  . Suicide attempt [facility])     Patient Active Problem List   Diagnosis Date Noted  . Failure to thrive in adult 11/27/2019  . Dementia with behavioral disturbance ([place]) 11/21/2019  . Gait abnormality 11/21/2019  . Syncope and  collapse 12/18/2018  . Falls 12/17/2018  . Constipation 06/11/2015  . Rectal bleeding 06/11/2015  . Bladder stones 05/22/2015  . Acute duodenal ulcer with gastric outlet obstruction   . Normocytic anemia 05/21/2015  . Duodenal stricture   . Abnormal serum level of alkaline phosphatase   . Nausea and vomiting 05/20/2015  . Esophageal stricture 05/20/2015  . Dysphagia 05/20/2015  . Depression 05/20/2015  . Duodenal ulcer 05/20/2015  . Acute esophagitis   . Gastric outflow obstruction   . Peptic ulcer   . Gastric outlet obstruction 05/07/2015  . Hematemesis with nausea   . Loss of weight   . CAD/History of STEMI-Oct 2016- conservative Rx 05/06/2015  . Dyslipidemia 05/06/2015  . PAFib/Atrial fibrillation 05/06/2015  . Orthostatic hypotension 05/05/2015  . HFrEF/Cardiomyopathy, ischemic-EF 20% 05/05/2015  . Chronic systolic CHF (congestive heart failure) ([place]) 05/05/2015  . Chest pain with moderate risk of acute coronary syndrome 05/03/2015  . Acute coronary syndrome ([place]) 02/28/2015  . MDD (major depressive disorder), recurrent severe, without psychosis ([place]) 12/19/2012  . Insomnia secondary to depression with anxiety 12/19/2012  . Anxiety   . Abnormal LFTs 10/01/2012  . Gallstones 10/01/2012  . Anemia 10/01/2012  . Erosive esophagitis 07/25/2012  . Gastric out let obstruction 07/25/2012  . Choledocholithiasis with obstruction 07/25/2012    Class: History of  . Obstructive jaundice 07/23/2012    Past Surgical History:  Procedure Laterality Date  .  APPENDECTOMY    . BACK SURGERY    . BALLOON DILATION N/A 07/24/2012   Procedure: BALLOON DILATION;  Surgeon: Daneil Dolin, MD;  Location: [facility];  Service: Endoscopy;  Laterality: N/A;  Balloon Stone Extraction, Drudging  . BIOPSY N/A 07/24/2012   Procedure: BIOPSY;  Surgeon: Daneil Dolin, MD;  Location: [facility];  Service: Endoscopy;  Laterality: N/A;  Duodenal and Esophageal Biopsies  . BIOPSY N/A 10/18/2012   Procedure:  BIOPSY;  Surgeon: Daneil Dolin, MD;  Location: [facility];  Service: Endoscopy;  Laterality: N/A;  . CHOLECYSTECTOMY N/A 10/26/2012   Procedure: LAPAROSCOPIC CHOLECYSTECTOMY;  Surgeon: Jamesetta So, MD;  Location: [facility];  Service: General;  Laterality: N/A;  . EGD/ERCP  10/18/2012   Dr. Gala Romney: ;yloric channel stenosis, s/p dilation and bx, persisting CBD stones with extension of sphincterotomy and sphincterotomy balloon dilation and stone extraction. Removal of biliary stent  . ERCP N/A 07/24/2012   Dr. Gala Romney. Significant abnormalities of the bowl and proximal second portion producing partial gastric outlet stricture and with secondary gastric dilation and severe erosive reflux esophagitis. Biopsy showed benign ulceration. Normal-appearing ampulla, status post biliary sphincterotomy and ampulla balloon dilation, stone extraction and stent placement.  Marland Kitchen ERCP N/A 10/18/2012   Procedure: ENDOSCOPIC RETROGRADE CHOLANGIOPANCREATOGRAPHY (ERCP);  Surgeon: Daneil Dolin, MD;  Location: [facility];  Service: Endoscopy;  Laterality: N/A;  . ESOPHAGEAL DILATION N/A 05/15/2015   Procedure: ESOPHAGEAL DILATION;  Surgeon: Danie Binder, MD;  Location: [facility];  Service: Endoscopy;  Laterality: N/A;  . ESOPHAGOGASTRODUODENOSCOPY N/A 07/24/2012   JIR:[id number] ampulla s/p biliary sphincterotomy and ampullary  . ESOPHAGOGASTRODUODENOSCOPY N/A 05/07/2015   RMR: Severe esophagitis, retained food in the stomach precluded complete exam, pyloric channel/duodenal bulb abnormal with 1 cm ulcer, friability with high-grade gastric outlet obstruction. No dilation performed on Plavix and aspirin.  . ESOPHAGOGASTRODUODENOSCOPY N/A 05/11/2015   Rehman: Distal esophageal ulcers with circumferential ulceration at distal 3-4 cm, soft stricture dilated with scope, diffuse erythema and edema in the bulbar mucosa with 2 small ulcers in the distal segment and high-grade stricture in the middle, scope could not be passed. Dilated up  to 12 mm. Could not see duodenum beyond this point because the scope was retained in the large stomach.  . ESOPHAGOGASTRODUODENOSCOPY N/A 05/15/2015   SLF: Esophagitis with stricture, dilated GE junction stricture up to 14 mm with the balloon, D1/D2 stricture with narrowing of the lumen to 10-11 mm, dilated up to 15 mm with balloon. Downstream duodenum appear normal. Small duodenal bulbar ulcer. Biopsies from the stomach benign gastritis, no H pylori.  . ESOPHAGOGASTRODUODENOSCOPY (EGD) WITH PROPOFOL N/A 10/18/2012   ENI:DPOEUMP channel stenosis s/p dilation/Persisting common bile duct stones with extension of sphincterotomy     and sphincterotomy balloon dilation and stone extraction.  Biliary stent removal   . LAPAROSCOPY N/A 10/31/2012   Procedure: LAPAROSCOPY DIAGNOSTIC;  Surgeon: Donato Heinz, MD;  Location: [facility];  Service: General;  Laterality: N/A;  . REMOVAL OF STONES N/A 07/24/2012   Procedure: REMOVAL OF STONES;  Surgeon: Daneil Dolin, MD;  Location: [facility];  Service: Endoscopy;  Laterality: N/A;  . REMOVAL OF STONES N/A 10/18/2012   Procedure: REMOVAL OF STONES;  Surgeon: Daneil Dolin, MD;  Location: [facility];  Service: Endoscopy;  Laterality: N/A;  . SPHINCTEROTOMY N/A 07/24/2012   Procedure: SPHINCTEROTOMY;  Surgeon: Daneil Dolin, MD;  Location: [facility];  Service: Endoscopy;  Laterality: N/A;  Family History  Problem Relation Age of Onset  . Depression Sister   . Other Mother        unsure of history  . Other Father        never had relationship w/ father  . Colon cancer Neg Hx   . Heart disease Neg Hx     Social History   Tobacco Use  . Smoking status: Current Every Day Smoker    Types: Cigars  . Smokeless tobacco: Never Used  . Tobacco comment: Smokes 4-5 cigars a day "but I don't really inhale."  Vaping Use  . Vaping Use: Never used  Substance Use Topics  . Alcohol use: No    Alcohol/week: 0.0 standard drinks  . Drug use: Never    Home  Medications Prior to Admission medications   Medication Sig Start Date End Date Taking? Authorizing Provider  aspirin EC 81 MG tablet Take 1 tablet (81 mg total) by mouth daily with breakfast. 12/21/18  Yes Emokpae, Courage, MD  atorvastatin (LIPITOR) 80 MG tablet Take 80 mg by mouth daily at 6 PM.   Yes [provider]  carvedilol (COREG) 3.125 MG tablet Take 1 tablet (3.125 mg total) by mouth 2 (two) times daily with a meal. 12/21/18  Yes Emokpae, Courage, MD  donepezil (ARICEPT) 10 MG tablet Take 1 tablet (10 mg total) by mouth at bedtime. 11/21/19  Yes Marcial Pacas, MD  ferrous sulfate 325 (65 FE) MG EC tablet Take 1 tablet (325 mg total) by mouth 2 (two) times daily. 12/21/18  Yes Emokpae, Courage, MD  nitroGLYCERIN (NITRODUR - DOSED IN MG/24 HR) 0.4 mg/hr patch APPLY ONE PATCH TOPICALLY ONCE DAILY Patient taking differently: Place 0.4 mg onto the skin daily.  06/16/17  Yes Fay Records, MD  pantoprazole (PROTONIX) 40 MG tablet Take 1 tablet (40 mg total) by mouth daily. 12/21/18  Yes Emokpae, Courage, MD  QUEtiapine (SEROQUEL) 25 MG tablet Take 2 tablets (50 mg total) by mouth 2 (two) times daily. Patient taking differently: Take 25-75 mg by mouth daily.  11/21/19  Yes Marcial Pacas, MD  Vitamin D, Ergocalciferol, (DRISDOL) 1.25 MG (50000 UT) CAPS capsule Take 1 capsule (50,000 Units total) by mouth every Sunday. 12/23/18   Roxan Hockey, MD    Allergies    Patient has no known allergies.  Review of Systems   Review of Systems  Unable to perform ROS: Dementia  Neurological: Positive for weakness.    Physical Exam Updated Vital Signs BP 133/82   Pulse (!) 34   Temp 98.5 F (36.9 C) (Oral)   Resp 19   Ht 6\' 3"  (1.905 m)   Wt 73.5 kg   SpO2 100%   BMI 20.25 kg/m   Physical Exam Vitals and nursing note reviewed.  Constitutional:      Appearance: He is well-developed.  HENT:     Head: Normocephalic and atraumatic.  Eyes:     Conjunctiva/sclera: Conjunctivae normal.   Cardiovascular:     Rate and Rhythm: Normal rate and regular rhythm.     Heart sounds: Normal heart sounds.  Pulmonary:     Effort: Pulmonary effort is normal.     Breath sounds: Normal breath sounds. No wheezing.  Abdominal:     General: Bowel sounds are normal.     Palpations: Abdomen is soft.     Tenderness: There is no abdominal tenderness.  Musculoskeletal:        General: Normal range of motion.     Cervical  back: Normal range of motion.  Skin:    General: Skin is warm and dry.  Neurological:     Mental Status: He is alert. Mental status is at baseline. He is disoriented.     Sensory: Sensation is intact.     Comments: Baseline confusion per caregiver at bedside.  Pt not cooperative for meaningful cranial nerve exam.  Refuses to try pronator drift, heel shin.  4/5 equal grip strength.  He was attempted at ambulation.  Two person assist for standing but unable to ambulate.      ED Results / Procedures / Treatments   Labs (all labs ordered are listed, but only abnormal results are displayed) Labs Reviewed  CBC WITH DIFFERENTIAL/PLATELET - Abnormal; Notable for the following components:      Result Value   Abs Immature Granulocytes 0.08 (*)    All other components within normal limits  COMPREHENSIVE METABOLIC PANEL - Abnormal; Notable for the following components:   Glucose, Bld 104 (*)    BUN 29 (*)    Alkaline Phosphatase 157 (*)    GFR calc non Af Amer 57 (*)    All other components within normal limits  URINALYSIS, ROUTINE W REFLEX MICROSCOPIC - Abnormal; Notable for the following components:   Hgb urine dipstick MODERATE (*)    Ketones, ur 5 (*)    Protein, ur 30 (*)    All other components within normal limits  LACTIC ACID, PLASMA    EKG EKG Interpretation  Date/Time:  Wednesday November 27 2019 11:31:13 EDT Ventricular Rate:  65 PR Interval:    QRS Duration: 98 QT Interval:  437 QTC Calculation: 455 R Axis:   -78 Text Interpretation: Sinus rhythm  Multiform ventricular premature complexes Left anterior fascicular block Consider right ventricular hypertrophy Probable left ventricular hypertrophy Anterior Q waves, possibly due to LVH since last tracing no significant change Confirmed by Noemi Chapel [phone]) on 11/27/2019 1:23:29 PM   Radiology DG Chest Port 1 View  Result Date: 11/27/2019 CLINICAL DATA:  Weakness, loss of appetite EXAM: PORTABLE CHEST 1 VIEW COMPARISON:  03/28/2019 FINDINGS: There is hyperinflation of the lungs compatible with COPD. Biapical scarring. Heart is normal size. No acute confluent airspace opacities or effusions. No acute bony abnormality. IMPRESSION: COPD/chronic changes.  No active disease. Electronically Signed   By: Rolm Baptise M.D.   On: 11/27/2019 12:47    Procedures Procedures (including critical care time)  Medications Ordered in ED Medications  sodium chloride 0.9 % bolus 500 mL (0 mLs Intravenous Stopped 11/27/19 1400)    ED Course  I have reviewed the triage vital signs and the nursing notes.  Pertinent labs & imaging results that were available during my care of the patient were reviewed by me and considered in my medical decision making (see chart for details).    MDM Rules/Calculators/A&P                          Pt with picture of failure to thrive with generalized weakness and no PO intake in 5 days, although has complaint of hunger here. He was given a protein shake. Labs reviewed, proteinuria without aki, BUN elevated at 29, probably secondary to dehydration.  Pt unable to ambulate. Will require admission, for hydration, consideration of PT/OT evaluation/rehab.  I also discussed patient's course with son Gevon Markus at home.  He indicated that with this recent decline he is considering looking for permanent placement for his father.  He  is a English as a second language teacher and plans to contact the [place] for exploring placement with the [facility] facility.  Discussed with Dr. Denton Brick who accepts pt for admission Final  Clinical Impression(s) / ED Diagnoses Final diagnoses:  Weakness    Rx / DC Orders ED Discharge Orders    None       Landis Martins 11/27/19 1734    Noemi Chapel, MD 11/28/19 [phone]

## 2019-11-27 NOTE — ED Notes (Signed)
Pt ate egg salad sandwhich and some drink. 2 person assist to attempt to ambulate. Pt unable to ambulate but was able to stand for about 30 secs with 2 person assist.

## 2019-11-27 NOTE — ED Notes (Signed)
Pt and pt family are aware we need urine sample, urinal at bedside.

## 2019-11-27 NOTE — ED Triage Notes (Signed)
Pt. C/o weakness and has not been eating since Saturday per caregiver. Pt has a hx of dementia and has not been taking his medications.

## 2019-11-28 DIAGNOSIS — Z515 Encounter for palliative care: Secondary | ICD-10-CM | POA: Diagnosis not present

## 2019-11-28 DIAGNOSIS — Z818 Family history of other mental and behavioral disorders: Secondary | ICD-10-CM | POA: Diagnosis not present

## 2019-11-28 DIAGNOSIS — Z79899 Other long term (current) drug therapy: Secondary | ICD-10-CM | POA: Diagnosis not present

## 2019-11-28 DIAGNOSIS — Z7189 Other specified counseling: Secondary | ICD-10-CM | POA: Diagnosis not present

## 2019-11-28 DIAGNOSIS — R627 Adult failure to thrive: Secondary | ICD-10-CM | POA: Diagnosis present

## 2019-11-28 DIAGNOSIS — I5022 Chronic systolic (congestive) heart failure: Secondary | ICD-10-CM | POA: Diagnosis present

## 2019-11-28 DIAGNOSIS — H9190 Unspecified hearing loss, unspecified ear: Secondary | ICD-10-CM | POA: Diagnosis present

## 2019-11-28 DIAGNOSIS — Z7982 Long term (current) use of aspirin: Secondary | ICD-10-CM | POA: Diagnosis not present

## 2019-11-28 DIAGNOSIS — I255 Ischemic cardiomyopathy: Secondary | ICD-10-CM | POA: Diagnosis present

## 2019-11-28 DIAGNOSIS — I252 Old myocardial infarction: Secondary | ICD-10-CM | POA: Diagnosis not present

## 2019-11-28 DIAGNOSIS — R531 Weakness: Secondary | ICD-10-CM | POA: Diagnosis present

## 2019-11-28 DIAGNOSIS — F332 Major depressive disorder, recurrent severe without psychotic features: Secondary | ICD-10-CM | POA: Diagnosis present

## 2019-11-28 DIAGNOSIS — Z8711 Personal history of peptic ulcer disease: Secondary | ICD-10-CM | POA: Diagnosis not present

## 2019-11-28 DIAGNOSIS — I110 Hypertensive heart disease with heart failure: Secondary | ICD-10-CM | POA: Diagnosis present

## 2019-11-28 DIAGNOSIS — F17290 Nicotine dependence, other tobacco product, uncomplicated: Secondary | ICD-10-CM | POA: Diagnosis present

## 2019-11-28 DIAGNOSIS — Z66 Do not resuscitate: Secondary | ICD-10-CM | POA: Diagnosis not present

## 2019-11-28 DIAGNOSIS — Z20822 Contact with and (suspected) exposure to covid-19: Secondary | ICD-10-CM | POA: Diagnosis present

## 2019-11-28 DIAGNOSIS — Z681 Body mass index (BMI) 19 or less, adult: Secondary | ICD-10-CM | POA: Diagnosis not present

## 2019-11-28 DIAGNOSIS — I444 Left anterior fascicular block: Secondary | ICD-10-CM | POA: Diagnosis present

## 2019-11-28 DIAGNOSIS — F0391 Unspecified dementia with behavioral disturbance: Secondary | ICD-10-CM | POA: Diagnosis present

## 2019-11-28 DIAGNOSIS — I493 Ventricular premature depolarization: Secondary | ICD-10-CM | POA: Diagnosis present

## 2019-11-28 DIAGNOSIS — I2510 Atherosclerotic heart disease of native coronary artery without angina pectoris: Secondary | ICD-10-CM | POA: Diagnosis present

## 2019-11-28 DIAGNOSIS — I480 Paroxysmal atrial fibrillation: Secondary | ICD-10-CM | POA: Diagnosis present

## 2019-11-28 LAB — GLUCOSE, CAPILLARY
Glucose-Capillary: 104 mg/dL — ABNORMAL HIGH (ref 70–99)
Glucose-Capillary: 90 mg/dL (ref 70–99)

## 2019-11-28 MED ORDER — NITROGLYCERIN 0.4 MG/HR TD PT24
0.4000 mg | MEDICATED_PATCH | Freq: Every day | TRANSDERMAL | Status: DC
  Administered 2019-11-28 – 2019-12-05 (×8): 0.4 mg via TRANSDERMAL
  Filled 2019-11-28 (×10): qty 1

## 2019-11-28 MED ORDER — LACTATED RINGERS IV SOLN: INTRAVENOUS | Status: AC

## 2019-11-28 MED ORDER — FERROUS SULFATE 325 (65 FE) MG PO TABS
325.0000 mg | ORAL_TABLET | Freq: Two times a day (BID) | ORAL | Status: DC
  Administered 2019-11-28 – 2019-12-05 (×15): 325 mg via ORAL
  Filled 2019-11-28 (×15): qty 1

## 2019-11-28 NOTE — NC FL2 (Signed)
[place] LEVEL OF CARE SCREENING TOOL     IDENTIFICATION  Patient Name: Edward Trevino Birthdate: [birth year] Sex: male Admission Date (Current Location): 11/27/2019  [facility] and [place] Number:  [employer] and Address:  [facility] [address] [place]      Provider Number: [id number]  Attending Physician Name and Address:  Rodena Goldmann, DO  Relative Name and Phone Number:  [name]  [phone]  care-giver    Current Level of Care: SNF Recommended Level of Care: [place] Prior Approval Number:    Date Approved/Denied:   PASRR Number: pending  Discharge Plan: SNF    Current Diagnoses: Patient Active Problem List   Diagnosis Date Noted  . Failure to thrive in adult 11/27/2019  . Dementia with behavioral disturbance ([place]) 11/21/2019  . Gait abnormality 11/21/2019  . Syncope and collapse 12/18/2018  . Falls 12/17/2018  . Constipation 06/11/2015  . Rectal bleeding 06/11/2015  . Bladder stones 05/22/2015  . Acute duodenal ulcer with gastric outlet obstruction   . Normocytic anemia 05/21/2015  . Duodenal stricture   . Abnormal serum level of alkaline phosphatase   . Nausea and vomiting 05/20/2015  . Esophageal stricture 05/20/2015  . Dysphagia 05/20/2015  . Depression 05/20/2015  . Duodenal ulcer 05/20/2015  . Acute esophagitis   . Gastric outflow obstruction   . Peptic ulcer   . Gastric outlet obstruction 05/07/2015  . Hematemesis with nausea   . Loss of weight   . CAD/History of STEMI-Oct 2016- conservative Rx 05/06/2015  . Dyslipidemia 05/06/2015  . PAFib/Atrial fibrillation 05/06/2015  . Orthostatic hypotension 05/05/2015  . HFrEF/Cardiomyopathy, ischemic-EF 20% 05/05/2015  . Chronic systolic CHF (congestive heart failure) ([place]) 05/05/2015  . Chest pain with moderate risk of acute coronary syndrome 05/03/2015  . Acute coronary syndrome ([place]) 02/28/2015  . MDD (major depressive  disorder), recurrent severe, without psychosis ([place]) 12/19/2012  . Insomnia secondary to depression with anxiety 12/19/2012  . Anxiety   . Abnormal LFTs 10/01/2012  . Gallstones 10/01/2012  . Anemia 10/01/2012  . Erosive esophagitis 07/25/2012  . Gastric out let obstruction 07/25/2012  . Choledocholithiasis with obstruction 07/25/2012  . Obstructive jaundice 07/23/2012    Orientation RESPIRATION BLADDER Height & Weight     Self, Place  Normal External catheter Weight: 68 kg Height:  6\' 3"  (190.5 cm)  BEHAVIORAL SYMPTOMS/MOOD NEUROLOGICAL BOWEL NUTRITION STATUS      Continent Diet (see DC summary)  AMBULATORY STATUS COMMUNICATION OF NEEDS Skin   Extensive Assist Verbally Skin abrasions (buttock)                       Personal Care Assistance Level of Assistance  Bathing, Feeding, Dressing Bathing Assistance: Maximum assistance Feeding assistance: Limited assistance Dressing Assistance: Limited assistance     Functional Limitations Info  Sight, Hearing, Speech Sight Info: Adequate Hearing Info: Adequate Speech Info: Adequate    SPECIAL CARE FACTORS FREQUENCY  PT (By licensed PT)     PT Frequency: 5 times a week.              Contractures Contractures Info: Not present    Additional Factors Info  Allergies, Code Status Code Status Info: FULL Allergies Info: NKDA           Current Medications (11/28/2019):  This is the current hospital active medication list Current Facility-Administered Medications  Medication Dose Route Frequency Provider Last Rate Last Admin  .  acetaminophen (TYLENOL) tablet 650 mg  650 mg Oral Q6H PRN Emokpae, Ejiroghene E, MD       Or  . acetaminophen (TYLENOL) suppository 650 mg  650 mg Rectal Q6H PRN Emokpae, Ejiroghene E, MD      . aspirin EC tablet 81 mg  81 mg Oral Q breakfast Emokpae, Ejiroghene E, MD   81 mg at 11/28/19 3903  . atorvastatin (LIPITOR) tablet 80 mg  80 mg Oral q1800 Emokpae, Ejiroghene E, MD      .  carvedilol (COREG) tablet 3.125 mg  3.125 mg Oral BID WC Emokpae, Ejiroghene E, MD   3.125 mg at 11/28/19 0092  . donepezil (ARICEPT) tablet 10 mg  10 mg Oral QHS Emokpae, Ejiroghene E, MD   10 mg at 11/27/19 2055  . enoxaparin (LOVENOX) injection 40 mg  40 mg Subcutaneous Q24H Emokpae, Ejiroghene E, MD   40 mg at 11/27/19 2055  . feeding supplement (ENSURE ENLIVE) (ENSURE ENLIVE) liquid 237 mL  237 mL Oral BID BM Emokpae, Ejiroghene E, MD   237 mL at 11/28/19 0826  . ferrous sulfate tablet 325 mg  325 mg Oral BID Heath Lark D, DO   325 mg at 11/28/19 1103  . lactated ringers infusion   Intravenous Continuous Heath Lark D, DO 75 mL/hr at 11/28/19 1101 New Bag at 11/28/19 1101  . nitroGLYCERIN (NITRODUR - Dosed in mg/24 hr) patch 0.4 mg  0.4 mg Transdermal Daily Manuella Ghazi, Pratik D, DO   0.4 mg at 11/28/19 1142  . ondansetron (ZOFRAN) tablet 4 mg  4 mg Oral Q6H PRN Emokpae, Ejiroghene E, MD       Or  . ondansetron (ZOFRAN) injection 4 mg  4 mg Intravenous Q6H PRN Emokpae, Ejiroghene E, MD      . pantoprazole (PROTONIX) EC tablet 40 mg  40 mg Oral Daily Emokpae, Ejiroghene E, MD   40 mg at 11/28/19 3300  . polyethylene glycol (MIRALAX / GLYCOLAX) packet 17 g  17 g Oral Daily PRN Emokpae, Ejiroghene E, MD      . QUEtiapine (SEROQUEL) tablet 50 mg  50 mg Oral Daily Emokpae, Ejiroghene E, MD   50 mg at 11/28/19 7622     Discharge Medications: Please see discharge summary for a list of discharge medications.  Relevant Imaging Results:  Relevant Lab Results:   Additional Information SS# [id number]  Boneta Lucks, RN

## 2019-11-28 NOTE — Plan of Care (Signed)
°  Problem: Acute Rehab PT Goals(only PT should resolve) Goal: Pt Will Go Supine/Side To Sit Outcome: Progressing Flowsheets (Taken 11/28/2019 0922) Pt will go Supine/Side to Sit: with modified independence Goal: Patient Will Transfer Sit To/From Stand Outcome: Progressing Flowsheets (Taken 11/28/2019 0220) Patient will transfer sit to/from stand: with supervision Goal: Pt Will Transfer Bed To Chair/Chair To Bed Outcome: Progressing Flowsheets (Taken 11/28/2019 0922) Pt will Transfer Bed to Chair/Chair to Bed: with supervision Goal: Pt Will Ambulate Outcome: Progressing Flowsheets (Taken 11/28/2019 0922) Pt will Ambulate:  75 feet  with min guard assist  with rolling walker  with cane   9:23 AM, 11/28/19 Lonell Grandchild, MPT Physical Therapist with [facility] [phone] office [phone] mobile phone

## 2019-11-28 NOTE — Evaluation (Signed)
Physical Therapy Evaluation Patient Details Name: Edward Trevino MRN: [id number] DOB: 08/07/1931 Today's Date: 11/28/2019   History of Present Illness  Edward Trevino is a 86 y.o. male with medical history significant for coronary artery disease, cardiomyopathy, atrial fibrillation, dementia, depression.Patient was brought to the ED with complaints of generalized weakness, and not been able to eat x 4 days, also reports that over the past 3 days patient has not been able to ambulate.  Per intake notes patient also had diarrhea x 2 weeks. Patient also refusing to take his medications.  Patient lives alone, but has a paid caregiver.At the time of my evaluation patient is awake alert oriented, is able to answer simple questions appropriately.  He tells me he just did not feel like eating anything.  He endorses pain with it.  He denies pain.  He is not short of breath has no cough has no pain with urination.  He denies vomiting, he denies loose stools to me.    Clinical Impression  Patient limited for functional mobility as stated below secondary to BLE weakness, fatigue and fair/poor standing balance.  Patient has to hold onto his T-shaped handle single point cane with both hands to maintain standing balance during transfers and ambulation, no loss of balance, but limited secondary to c/o fatigue.  Patient tolerated sitting up in chair after therapy. Patient will benefit from continued physical therapy in hospital and recommended venue below to increase strength, balance, endurance for safe ADLs and gait.     Follow Up Recommendations SNF;Supervision for mobility/OOB;Supervision - Intermittent    Equipment Recommendations  Rolling walker with 5" wheels    Recommendations for Other Services       Precautions / Restrictions Precautions Precautions: Fall Restrictions Weight Bearing Restrictions: No      Mobility  Bed Mobility Overal bed mobility: Needs Assistance Bed Mobility: Supine to Sit      Supine to sit: Supervision     General bed mobility comments: increased time, slightly labored movement  Transfers Overall transfer level: Needs assistance Equipment used: Straight cane Transfers: Sit to/from Stand;Stand Pivot Transfers Sit to Stand: Min guard Stand pivot transfers: Min guard;Min assist       General transfer comment: increased time, holds onto T shaped cane with both hands  Ambulation/Gait Ambulation/Gait assistance: Min guard;Min assist Gait Distance (Feet): 30 Feet Assistive device: Straight cane Gait Pattern/deviations: Step-to pattern;Decreased step length - right;Decreased step length - left;Decreased stride length Gait velocity: decreased   General Gait Details: slow slightly labored cadence with short step/stride length, has to hold onto T shaped cane with both hands to maintain balance, limited secondary to c/o fatigue  Stairs            Wheelchair Mobility    Modified Rankin (Stroke Patients Only)       Balance Overall balance assessment: Needs assistance Sitting-balance support: Feet supported;No upper extremity supported Sitting balance-Leahy Scale: Good Sitting balance - Comments: seated at EOB   Standing balance support: During functional activity;Bilateral upper extremity supported Standing balance-Leahy Scale: Fair Standing balance comment: using T - shaped handle SPC                             Pertinent Vitals/Pain Pain Assessment: No/denies pain    Home Living Family/patient expects to be discharged to:: Private residence Living Arrangements: Alone Available Help at Discharge: Family;Personal care attendant;Available PRN/intermittently Type of Home: Mobile home Home Access: Ramped entrance  Home Layout: One level Home Equipment: Cane - single point Additional Comments: information taken from previous admission due to patient is poor historian    Prior Function Level of Independence: Needs  assistance   Gait / Transfers Assistance Needed: household ambulator using [facility]  ADL's / Homemaking Assistance Needed: Caregiver from 9 am to 3 pm M-F        Hand Dominance        Extremity/Trunk Assessment   Upper Extremity Assessment Upper Extremity Assessment: Overall WFL for tasks assessed    Lower Extremity Assessment Lower Extremity Assessment: Generalized weakness    Cervical / Trunk Assessment Cervical / Trunk Assessment: Kyphotic  Communication   Communication: No difficulties  Cognition Arousal/Alertness: Awake/alert Behavior During Therapy: WFL for tasks assessed/performed Overall Cognitive Status: History of cognitive impairments - at baseline                                        General Comments      Exercises     Assessment/Plan    PT Assessment Patient needs continued PT services  PT Problem List Decreased strength;Decreased activity tolerance;Decreased balance;Decreased mobility       PT Treatment Interventions Balance training;Gait training;Stair training;Functional mobility training;Therapeutic activities;Therapeutic exercise;Patient/family education    PT Goals (Current goals can be found in the Care Plan section)  Acute Rehab PT Goals Patient Stated Goal: return home PT Goal Formulation: With patient Time For Goal Achievement: 12/12/19 Potential to Achieve Goals: Good    Frequency Min 3X/week   Barriers to discharge        Co-evaluation               AM-PAC PT "6 Clicks" Mobility  Outcome Measure Help needed turning from your back to your side while in a flat bed without using bedrails?: None Help needed moving from lying on your back to sitting on the side of a flat bed without using bedrails?: A Little Help needed moving to and from a bed to a chair (including a wheelchair)?: A Little Help needed standing up from a chair using your arms (e.g., wheelchair or bedside chair)?: A Little Help needed to walk  in hospital room?: A Lot Help needed climbing 3-5 steps with a railing? : A Lot 6 Click Score: 17    End of Session   Activity Tolerance: Patient tolerated treatment well;Patient limited by fatigue Patient left: in chair;with call bell/phone within reach;with chair alarm set Nurse Communication: Mobility status PT Visit Diagnosis: Unsteadiness on feet (R26.81);Other abnormalities of gait and mobility (R26.89);Muscle weakness (generalized) (M62.81)    Time: 1779-3903 PT Time Calculation (min) (ACUTE ONLY): 26 min   Charges:   PT Evaluation $PT Eval Moderate Complexity: 1 Mod PT Treatments $Therapeutic Activity: 23-37 mins        9:21 AM, 11/28/19 Lonell Grandchild, MPT Physical Therapist with [facility] 336 [phone] office [phone] mobile phone

## 2019-11-28 NOTE — TOC Initial Note (Signed)
Transition of Care [facility]) - Initial/Assessment Note    Patient Details  Name: Edward Trevino MRN: [id number] Date of Birth: 1931/09/29  Transition of [facility]) CM/SW Contact:    Boneta Lucks, RN Phone Number: 11/28/2019, 2:54 PM  Clinical Narrative:          Patient admitted with sepsis, lives at home with [name]- caregiver there during the day. PT is recommending SNF. [name] is agreeable. She states his family is not involved in his care. She did however call his son and he is agreeing with SNF.  Referrals sent out, [name] accepted the bed offer at [facility], patient is medically ready. Per Jackelyn Poling they need to wait on PASSR#, docs faxed to [place] [facility]. TOC to follow.    Expected Discharge Plan: Skilled Nursing Facility Barriers to Discharge: Other (comment) (PASRR)   Patient Goals and CMS Choice Patient states their goals for this hospitalization and ongoing recovery are:: To go to SNF CMS Medicare.gov Compare Post Acute Care list provided to:: Patient Represenative (must comment) Choice offered to / list presented to :  [name])  Expected Discharge Plan and Services Expected Discharge Plan: [place]    Living arrangements for the past 2 months: Single Family Home            Prior Living Arrangements/Services Living arrangements for the past 2 months: Single Family Home Lives with:: Self, Other (Comment) (care giver)                   Activities of Daily Living Home Assistive Devices/Equipment: Cane (specify quad or straight) ADL Screening (condition at time of admission) Patient's cognitive ability adequate to safely complete daily activities?: Yes Is the patient deaf or have difficulty hearing?: No Does the patient have difficulty seeing, even when wearing glasses/contacts?: No Does the patient have difficulty concentrating, remembering, or making decisions?: No Patient able to express need for assistance with ADLs?: Yes Does the patient have difficulty  dressing or bathing?: No Independently performs ADLs?: Yes (appropriate for developmental age) Does the patient have difficulty walking or climbing stairs?: Yes Weakness of Legs: Both Weakness of Arms/Hands: None  Permission Sought/Granted      Emotional Assessment    Orientation: : Oriented to Self, Oriented to Place Alcohol / Substance Use: Not Applicable Psych Involvement: No (comment)  Admission diagnosis:  Weakness [R53.1] Failure to thrive in adult [R62.7] Patient Active Problem List   Diagnosis Date Noted  . Failure to thrive in adult 11/27/2019  . Dementia with behavioral disturbance ([place]) 11/21/2019  . Gait abnormality 11/21/2019  . Syncope and collapse 12/18/2018  . Falls 12/17/2018  . Constipation 06/11/2015  . Rectal bleeding 06/11/2015  . Bladder stones 05/22/2015  . Acute duodenal ulcer with gastric outlet obstruction   . Normocytic anemia 05/21/2015  . Duodenal stricture   . Abnormal serum level of alkaline phosphatase   . Nausea and vomiting 05/20/2015  . Esophageal stricture 05/20/2015  . Dysphagia 05/20/2015  . Depression 05/20/2015  . Duodenal ulcer 05/20/2015  . Acute esophagitis   . Gastric outflow obstruction   . Peptic ulcer   . Gastric outlet obstruction 05/07/2015  . Hematemesis with nausea   . Loss of weight   . CAD/History of STEMI-Oct 2016- conservative Rx 05/06/2015  . Dyslipidemia 05/06/2015  . PAFib/Atrial fibrillation 05/06/2015  . Orthostatic hypotension 05/05/2015  . HFrEF/Cardiomyopathy, ischemic-EF 20% 05/05/2015  . Chronic systolic CHF (congestive heart failure) ([place]) 05/05/2015  . Chest pain with moderate risk of acute coronary  syndrome 05/03/2015  . Acute coronary syndrome ([place]) 02/28/2015  . MDD (major depressive disorder), recurrent severe, without psychosis ([place]) 12/19/2012  . Insomnia secondary to depression with anxiety 12/19/2012  . Anxiety   . Abnormal LFTs 10/01/2012  . Gallstones 10/01/2012  . Anemia 10/01/2012  .  Erosive esophagitis 07/25/2012  . Gastric out let obstruction 07/25/2012  . Choledocholithiasis with obstruction 07/25/2012    Class: History of  . Obstructive jaundice 07/23/2012   PCP:  Leeanne Rio, MD Pharmacy:   [place], [place] - [place] [address] [place] [address] Phone: [phone] Fax: [phone]

## 2019-11-28 NOTE — Progress Notes (Addendum)
PROGRESS NOTE    [name]  WFU:[id number] DOB: 03-18-1932 DOA: 11/27/2019 PCP: Edward Rio, MD   Brief Narrative:  Per HPI: Edward Trevino is a 86 y.o. male with medical history significant for coronary artery disease, cardiomyopathy, atrial fibrillation, dementia, depression. Patient was brought to the ED with complaints of generalized weakness, and not been able to eat x 4 days, also reports that over the past 3 days patient has not been able to ambulate.  Per intake notes patient also had diarrhea x 2 weeks. Patient also refusing to take his medications.  Patient lives alone, but has a paid caregiver. At the time of my evaluation patient is awake alert oriented, is able to answer simple questions appropriately.  He tells me he just did not feel like eating anything.  He endorses pain with it.  He denies pain.  He is not short of breath has no cough has no pain with urination.  He denies vomiting, he denies loose stools to me.  ED Course: Stable vitals.  Sodium 144.  Otherwise unremarkable CBC CMP.  UA not suggestive of infection.  Normal lactic acid 1.8.  Portable chest x-ray without acute abnormality.  EKG shows PVCs.  500 mill bolus given in ED.  In the ED patient was unable to ambulate, but was able to stand for about 30 seconds with two person assist.  Hospitalist to admit for further evaluation and management.  Assessment & Plan:   Principal Problem:   Failure to thrive in adult Active Problems:   MDD (major depressive disorder), recurrent severe, without psychosis ([place])   Chronic systolic CHF (congestive heart failure) (H[facility])   PAFib/Atrial fibrillation   Dementia with behavioral disturbance ([facility])   Generalized weakness secondary to failure to thrive -Continue nutritional supplements with Ensure -Continue IV fluid with LR  -May need appetite stimulants and will order calorie count with dietitian evaluation -Appreciate PT assessment with recommendations for SNF and  will need placement  Dementia with depression -Seroquel and donepezil  Ischemic cardiomyopathy -Last 2D echocardiogram 12/2018 with EF 55-60% with impaired LV diastolic relaxation -Continue aspirin, statins, carvedilol   DVT prophylaxis: Lovenox Code Status: Full code Family Communication: Discussed with son Edward Trevino; pt has caregiver Edward Trevino, whom I spoke with as well Disposition Plan:  Status is: Observation  The patient will require care spanning > 2 midnights and should be moved to inpatient because: Unsafe d/c plan  Dispo: The patient is from: Home              Anticipated d/c is to: SNF              Anticipated d/c date is: 1 day              Patient currently is not medically stable to d/c.  Consultants:   None  Procedures:   See below  Antimicrobials:   None   Subjective: Patient seen and evaluated today with no new acute complaints or concerns. No acute concerns or events noted overnight.  He is not hungry.  Objective: Vitals:   11/27/19 1957 11/27/19 2000 11/28/19 0119 11/28/19 0556  BP: 110/78 120/64 127/66 (!) 144/74  Pulse:   86 62  Resp: 19 20 17 16   Temp: 97.8 F (36.6 C)  98.6 F (37 C) 97.9 F (36.6 C)  TempSrc: Oral   Oral  SpO2:   99% 100%  Weight:   68 kg   Height:  No intake or output data in the 24 hours ending 11/28/19 1021 Filed Weights   11/27/19 1052 11/28/19 0119  Weight: 73.5 kg 68 kg    Examination:  General exam: Appears calm and comfortable  Respiratory system: Clear to auscultation. Respiratory effort normal. Cardiovascular system: S1 & S2 heard, RRR. No JVD, murmurs, rubs, gallops or clicks. No pedal edema. Gastrointestinal system: Abdomen is nondistended, soft and nontender. No organomegaly or masses felt. Normal bowel sounds heard. Central nervous system: Alert and awake Extremities: No edema Skin: No rashes, lesions or ulcers Psychiatry: Flat affect    Data Reviewed: I have personally reviewed  following labs and imaging studies  CBC: Recent Labs  Lab 11/27/19 1238  WBC 8.7  NEUTROABS 6.2  HGB 14.4  HCT 43.3  MCV 99.3  PLT 025   Basic Metabolic Panel: Recent Labs  Lab 11/21/19 1516 11/27/19 1238  NA 140 144  K 4.4 4.0  CL 105 108  CO2 22 23  GLUCOSE 118* 104*  BUN 14 29*  CREATININE 1.02 1.16  CALCIUM 8.5* 9.3   GFR: Estimated Creatinine Clearance: 44.8 mL/min (by C-G formula based on SCr of 1.16 mg/dL). Liver Function Tests: Recent Labs  Lab 11/21/19 1516 11/27/19 1238  AST 19 22  ALT 9 16  ALKPHOS 192* 157*  BILITOT 0.2 0.9  PROT 6.8 7.8  ALBUMIN 4.0 4.1   No results for input(s): LIPASE, AMYLASE in the last 168 hours. No results for input(s): AMMONIA in the last 168 hours. Coagulation Profile: No results for input(s): INR, PROTIME in the last 168 hours. Cardiac Enzymes: No results for input(s): CKTOTAL, CKMB, CKMBINDEX, TROPONINI in the last 168 hours. BNP (last 3 results) No results for input(s): PROBNP in the last 8760 hours. HbA1C: No results for input(s): HGBA1C in the last 72 hours. CBG: Recent Labs  Lab 11/28/19 0750  GLUCAP 104*   Lipid Profile: No results for input(s): CHOL, HDL, LDLCALC, TRIG, CHOLHDL, LDLDIRECT in the last 72 hours. Thyroid Function Tests: No results for input(s): TSH, T4TOTAL, FREET4, T3FREE, THYROIDAB in the last 72 hours. Anemia Panel: No results for input(s): VITAMINB12, FOLATE, FERRITIN, TIBC, IRON, RETICCTPCT in the last 72 hours. Sepsis Labs: Recent Labs  Lab 11/27/19 1238  LATICACIDVEN 1.8    Recent Results (from the past 240 hour(s))  SARS Coronavirus 2 by RT PCR (hospital order, performed in [facility] hospital lab) Nasopharyngeal Nasopharyngeal Swab     Status: None   Collection Time: 11/27/19  5:49 PM   Specimen: Nasopharyngeal Swab  Result Value Ref Range Status   SARS Coronavirus 2 NEGATIVE NEGATIVE Final    Comment: (NOTE) SARS-CoV-2 target nucleic acids are NOT DETECTED.  The  SARS-CoV-2 RNA is generally detectable in upper and lower respiratory specimens during the acute phase of infection. The lowest concentration of SARS-CoV-2 viral copies this assay can detect is 250 copies / mL. A negative result does not preclude SARS-CoV-2 infection and should not be used as the sole basis for treatment or other patient management decisions.  A negative result may occur with improper specimen collection / handling, submission of specimen other than nasopharyngeal swab, presence of viral mutation(s) within the areas targeted by this assay, and inadequate number of viral copies (<250 copies / mL). A negative result must be combined with clinical observations, patient history, and epidemiological information.  Fact Sheet for Patients:   StrictlyIdeas.no  Fact Sheet for Healthcare Providers: BankingDealers.co.za  This test is not yet approved or  cleared by the  [place] Architectural technologist and has been authorized for detection and/or diagnosis of SARS-CoV-2 by FDA under an Print production planner (EUA).  This EUA will remain in effect (meaning this test can be used) for the duration of the COVID-19 declaration under Section 564(b)(1) of the Act, 21 U.S.C. section 360bbb-3(b)(1), unless the authorization is terminated or revoked sooner.  Performed at [facility], [address], [place], [place] [address]          Radiology Studies: [facility] 1 View  Result Date: 11/27/2019 CLINICAL DATA:  Weakness, loss of appetite EXAM: PORTABLE CHEST 1 VIEW COMPARISON:  03/28/2019 FINDINGS: There is hyperinflation of the lungs compatible with COPD. Biapical scarring. Heart is normal size. No acute confluent airspace opacities or effusions. No acute bony abnormality. IMPRESSION: COPD/chronic changes.  No active disease. Electronically Signed   By: Rolm Baptise M.D.   On: 11/27/2019 12:47        Scheduled Meds: . aspirin EC  81 mg  Oral Q breakfast  . atorvastatin  80 mg Oral q1800  . carvedilol  3.125 mg Oral BID WC  . donepezil  10 mg Oral QHS  . enoxaparin (LOVENOX) injection  40 mg Subcutaneous Q24H  . feeding supplement (ENSURE ENLIVE)  237 mL Oral BID BM  . pantoprazole  40 mg Oral Daily  . QUEtiapine  50 mg Oral Daily   Continuous Infusions: . lactated ringers       LOS: 0 days    Time spent: 35 minutes    Rainy Rothman Darleen Crocker, DO [facility]  If 7PM-7AM, please contact night-coverage [email or web address] 11/28/2019, 10:21 AM

## 2019-11-28 NOTE — Plan of Care (Signed)

## 2019-11-29 DIAGNOSIS — F0391 Unspecified dementia with behavioral disturbance: Secondary | ICD-10-CM

## 2019-11-29 DIAGNOSIS — F332 Major depressive disorder, recurrent severe without psychotic features: Secondary | ICD-10-CM

## 2019-11-29 DIAGNOSIS — I480 Paroxysmal atrial fibrillation: Secondary | ICD-10-CM

## 2019-11-29 DIAGNOSIS — R531 Weakness: Secondary | ICD-10-CM

## 2019-11-29 DIAGNOSIS — R5381 Other malaise: Secondary | ICD-10-CM

## 2019-11-29 LAB — CBC
HCT: 40.8 % (ref 39.0–52.0)
Hemoglobin: 13.2 g/dL (ref 13.0–17.0)
MCH: 32.7 pg (ref 26.0–34.0)
MCHC: 32.4 g/dL (ref 30.0–36.0)
MCV: 101 fL — ABNORMAL HIGH (ref 80.0–100.0)
Platelets: 229 10*3/uL (ref 150–400)
RBC: 4.04 MIL/uL — ABNORMAL LOW (ref 4.22–5.81)
RDW: 12.6 % (ref 11.5–15.5)
WBC: 7.7 10*3/uL (ref 4.0–10.5)
nRBC: 0 % (ref 0.0–0.2)

## 2019-11-29 LAB — BASIC METABOLIC PANEL
Anion gap: 11 (ref 5–15)
BUN: 20 mg/dL (ref 8–23)
CO2: 29 mmol/L (ref 22–32)
Calcium: 9.3 mg/dL (ref 8.9–10.3)
Chloride: 105 mmol/L (ref 98–111)
Creatinine, Ser: 1.07 mg/dL (ref 0.61–1.24)
GFR calc Af Amer: >60 mL/min (ref >60)
GFR calc non Af Amer: >60 mL/min (ref >60)
Glucose, Bld: 107 mg/dL — ABNORMAL HIGH (ref 70–99)
Potassium: 3.9 mmol/L (ref 3.5–5.1)
Sodium: 145 mmol/L (ref 135–145)

## 2019-11-29 MED ORDER — VITAMIN B-12 1000 MCG PO TABS
1000.0000 ug | ORAL_TABLET | Freq: Every day | ORAL | Status: DC
  Administered 2019-11-29 – 2019-12-05 (×7): 1000 ug via ORAL
  Filled 2019-11-29 (×7): qty 1

## 2019-11-29 NOTE — Progress Notes (Signed)
PROGRESS NOTE    Edward Trevino  LDJ:[id number] DOB: 04-03-1932 DOA: 11/27/2019 PCP: Leeanne Rio, MD   Brief Narrative:  Per HPI: Edward Trevino is a 86 y.o. male with medical history significant for coronary artery disease, cardiomyopathy, atrial fibrillation, dementia, depression. Patient was brought to the ED with complaints of generalized weakness, and not been able to eat x 4 days, also reports that over the past 3 days patient has not been able to ambulate.  Per intake notes patient also had diarrhea x 2 weeks. Patient also refusing to take his medications.  Patient lives alone, but has a paid caregiver. At the time of my evaluation patient is awake alert oriented, is able to answer simple questions appropriately.  He tells me he just did not feel like eating anything.  He endorses pain with it.  He denies pain.  He is not short of breath has no cough has no pain with urination.  He denies vomiting, he denies loose stools to me.  ED Course: Stable vitals.  Sodium 144.  Otherwise unremarkable CBC CMP.  UA not suggestive of infection.  Normal lactic acid 1.8.  Portable chest x-ray without acute abnormality.  EKG shows PVCs.  500 mill bolus given in ED.  In the ED patient was unable to ambulate, but was able to stand for about 30 seconds with two person assist.  Hospitalist to admit for further evaluation and management.  Assessment & Plan:   Principal Problem:   Failure to thrive in adult Active Problems:   MDD (major depressive disorder), recurrent severe, without psychosis ([place])   Chronic systolic CHF (congestive heart failure) (HCC)   PAFib/Atrial fibrillation   Dementia with behavioral disturbance (HCC)   Generalized weakness secondary to failure to thrive -Continue nutritional supplements with Ensure -Continue to encourage oral intake. -May need appetite stimulants and will order calorie count with dietitian evaluation -Appreciate PT assessment with recommendations for SNF  and will need placement  Dementia with depression -Seroquel and donepezil  Ischemic cardiomyopathy -Last 2D echocardiogram 12/2018 with EF 55-60% with impaired LV diastolic relaxation -Continue aspirin, statins, carvedilol -No chest pain or shortness of breath.  Paroxysmal atrial fibrillation -High risk for fall no using anticoagulation -Continue the use of aspirin -Good rate control with the use of Coreg.  DNR -Long discussion with son at bedside; decision has been made to allow initiating course if patient experience any significant respiratory distress or his heart stopped. -DNR/DNI.  Macrocytosis  -started on B12 supplementation.   DVT prophylaxis: Lovenox Code Status: Full code Family Communication: Discussed with son at bedside. Disposition Plan:  Status is: Inpatient  Dispo: The patient is from: Home              Anticipated d/c is to: SNF              Anticipated d/c date is: 1 day              Patient currently is not medically stable to d/c.  High risk for falls due to underlying physical deconditioning.  Still with decreased oral intake.  Recommendations given by physical therapy as to preserve rehabilitation in a skilled nursing facility prior to go home.  Family in agreement.  Consultants:   None  Procedures:   See below  Antimicrobials:   None   Subjective: No fever, no chest pain, no nausea, no vomiting.  Continues to be weak and deconditioned; appetite is still poor.  Taking medication by mouth.  Objective: Vitals:   11/28/19 1500 11/29/19 0000 11/29/19 0600 11/29/19 1544  BP: 120/68 100/75 135/66 (!) 103/55  Pulse: 86 74 67 90  Resp: 16 18 20 20   Temp: 98 F (36.7 C) 98.2 F (36.8 C) 98.3 F (36.8 C) 98.4 F (36.9 C)  TempSrc: Oral Oral Oral Oral  SpO2: 100% 98% 99% 100%  Weight:      Height:        Intake/Output Summary (Last 24 hours) at 11/29/2019 1653 Last data filed at 11/29/2019 0900 Gross per 24 hour  Intake 720 ml  Output  400 ml  Net 320 ml   Filed Weights   11/27/19 1052 11/28/19 0119  Weight: 73.5 kg 68 kg    Examination: General exam: Afebrile. Intermittently confused and oriented only to person and place.  No chest pain, no nausea, no vomiting. Respiratory system: Clear to auscultation. Respiratory effort normal. Cardiovascular system:RRR. No murmurs, rubs, gallops. Gastrointestinal system: Abdomen is nondistended, soft and nontender. No organomegaly or masses felt. Normal bowel sounds heard. Central nervous system: No focal neurological deficits. Extremities: No cyanosis or clubbing.  No edema appreciated. Skin: No rashes, no petechiae. Psychiatry: Judgement and insight appear impaired secondary to underlying dementia.  Flat affect with decreased interaction appreciated.   Data Reviewed: I have personally reviewed following labs and imaging studies  CBC: Recent Labs  Lab 11/27/19 1238 11/29/19 0738  WBC 8.7 7.7  NEUTROABS 6.2  --   HGB 14.4 13.2  HCT 43.3 40.8  MCV 99.3 101.0*  PLT 282 580   Basic Metabolic Panel: Recent Labs  Lab 11/27/19 1238 11/29/19 0738  NA 144 145  K 4.0 3.9  CL 108 105  CO2 23 29  GLUCOSE 104* 107*  BUN 29* 20  CREATININE 1.16 1.07  CALCIUM 9.3 9.3   GFR: Estimated Creatinine Clearance: 48.5 mL/min (by C-G formula based on SCr of 1.07 mg/dL).   Liver Function Tests: Recent Labs  Lab 11/27/19 1238  AST 22  ALT 16  ALKPHOS 157*  BILITOT 0.9  PROT 7.8  ALBUMIN 4.1   CBG: Recent Labs  Lab 11/28/19 0750 11/28/19 1135  GLUCAP 104* 90   Sepsis Labs: Recent Labs  Lab 11/27/19 1238  LATICACIDVEN 1.8    Recent Results (from the past 240 hour(s))  SARS Coronavirus 2 by RT PCR (hospital order, performed in [facility] hospital lab) Nasopharyngeal Nasopharyngeal Swab     Status: None   Collection Time: 11/27/19  5:49 PM   Specimen: Nasopharyngeal Swab  Result Value Ref Range Status   SARS Coronavirus 2 NEGATIVE NEGATIVE Final    Comment:  (NOTE) SARS-CoV-2 target nucleic acids are NOT DETECTED.  The SARS-CoV-2 RNA is generally detectable in upper and lower respiratory specimens during the acute phase of infection. The lowest concentration of SARS-CoV-2 viral copies this assay can detect is 250 copies / mL. A negative result does not preclude SARS-CoV-2 infection and should not be used as the sole basis for treatment or other patient management decisions.  A negative result may occur with improper specimen collection / handling, submission of specimen other than nasopharyngeal swab, presence of viral mutation(s) within the areas targeted by this assay, and inadequate number of viral copies (<250 copies / mL). A negative result must be combined with clinical observations, patient history, and epidemiological information.  Fact Sheet for Patients:   StrictlyIdeas.no  Fact Sheet for Healthcare Providers: BankingDealers.co.za  This test is not yet approved or  cleared by the [place]  FDA and has been authorized for detection and/or diagnosis of SARS-CoV-2 by FDA under an Emergency Use Authorization (EUA).  This EUA will remain in effect (meaning this test can be used) for the duration of the COVID-19 declaration under Section 564(b)(1) of the Act, 21 U.S.C. section 360bbb-3(b)(1), unless the authorization is terminated or revoked sooner.  Performed at [facility], [address] [place], [place] [address]      Radiology Studies: No results found.   Scheduled Meds: . aspirin EC  81 mg Oral Q breakfast  . atorvastatin  80 mg Oral q1800  . carvedilol  3.125 mg Oral BID WC  . donepezil  10 mg Oral QHS  . enoxaparin (LOVENOX) injection  40 mg Subcutaneous Q24H  . feeding supplement (ENSURE ENLIVE)  237 mL Oral BID BM  . ferrous sulfate  325 mg Oral BID  . nitroGLYCERIN  0.4 mg Transdermal Daily  . pantoprazole  40 mg Oral Daily  . QUEtiapine  50 mg Oral Daily    Continuous Infusions:    LOS: 1 day    Time spent: 30 minutes    Barton Dubois, MD [facility]  If 7PM-7AM, please contact night-coverage [email or web address] 11/29/2019, 4:53 PM

## 2019-11-29 NOTE — Evaluation (Signed)
Occupational Therapy Evaluation Patient Details Name: Edward Trevino MRN: [id number] DOB: 09/01/1931 Today's Date: 11/29/2019    History of Present Illness Edward Trevino is a 86 y.o. male with medical history significant for coronary artery disease, cardiomyopathy, atrial fibrillation, dementia, depression.Patient was brought to the ED with complaints of generalized weakness, and not been able to eat x 4 days, also reports that over the past 3 days patient has not been able to ambulate.  Per intake notes patient also had diarrhea x 2 weeks. Patient also refusing to take his medications.  Patient lives alone, but has a paid caregiver.At the time of my evaluation patient is awake alert oriented, is able to answer simple questions appropriately.  He tells me he just did not feel like eating anything.  He endorses pain with it.  He denies pain.  He is not short of breath has no cough has no pain with urination.  He denies vomiting, he denies loose stools to me.   Clinical Impression   PTA, pt was living alone and has a caregiver Mon-Friday; use of SPC for mobility. Pt currently requiring Min A for LB ADLs and functional transfers. Pt demonstrating decreased activity tolerance and balance impacting his safe performance of ADLs. Pt would benefit from further acute OT to facilitate safe dc. Recommend dc to SNF for further OT to optimize safety, independence with ADLs, and return to PLOF.     Follow Up Recommendations  SNF    Equipment Recommendations  None recommended by OT    Recommendations for Other Services PT consult     Precautions / Restrictions Precautions Precautions: Fall      Mobility Bed Mobility Overal bed mobility: Needs Assistance Bed Mobility: Supine to Sit     Supine to sit: Supervision     General bed mobility comments: increased time, slightly labored movement  Transfers Overall transfer level: Needs assistance Equipment used: Straight cane Transfers: Sit to/from  Stand;Stand Pivot Transfers Sit to Stand: Min guard Stand pivot transfers: Min guard;Min assist       General transfer comment: Min Guard A for safety. Min A for balance during pivot    Balance Overall balance assessment: Needs assistance Sitting-balance support: Feet supported;No upper extremity supported Sitting balance-Leahy Scale: Good Sitting balance - Comments: seated at EOB   Standing balance support: During functional activity;Bilateral upper extremity supported Standing balance-Leahy Scale: Poor Standing balance comment: Reliant on UE support. using T - shaped handle SPC                           ADL either performed or assessed with clinical judgement   ADL Overall ADL's : Needs assistance/impaired Eating/Feeding: Set up;Supervision/ safety;Sitting Eating/Feeding Details (indicate cue type and reason): Once breakfast set up at tray, pt able to feed him self and eat a muffin and breakfast sandwich Grooming: Set up;Sitting;Supervision/safety   Upper Body Bathing: Set up;Supervision/ safety;Sitting   Lower Body Bathing: Minimal assistance;Sit to/from stand   Upper Body Dressing : Set up;Supervision/safety;Sitting   Lower Body Dressing: Minimal assistance;Sit to/from stand   Toilet Transfer: Minimal assistance;Stand-pivot (simulated to recliner)           Functional mobility during ADLs: Minimal assistance;Cane General ADL Comments: Pt with decreased balance. However, unsure of baseline function     Vision Baseline Vision/History: Wears glasses Wears Glasses: At all times Patient Visual Report: No change from baseline       Perception  Praxis      Pertinent Vitals/Pain Pain Assessment: No/denies pain     Hand Dominance Right   Extremity/Trunk Assessment Upper Extremity Assessment Upper Extremity Assessment: Overall WFL for tasks assessed   Lower Extremity Assessment Lower Extremity Assessment: Defer to PT evaluation   Cervical /  Trunk Assessment Cervical / Trunk Assessment: Kyphotic   Communication Communication Communication: No difficulties   Cognition Arousal/Alertness: Awake/alert Behavior During Therapy: WFL for tasks assessed/performed Overall Cognitive Status: History of cognitive impairments - at baseline                                 General Comments: Pt following commands requiring increased time for processing.    General Comments  VSS    Exercises     Shoulder Instructions      Home Living Family/patient expects to be discharged to:: Private residence Living Arrangements: Alone Available Help at Discharge: Family;Personal care attendant;Available PRN/intermittently Type of Home: Mobile home Home Access: Ramped entrance     Home Layout: One level         Bathroom Toilet: Standard Bathroom Accessibility: Yes   Home Equipment: Cane - single point   Additional Comments: information taken from previous admission due to patient is poor historian      Prior Functioning/Environment Level of Independence: Needs assistance  Gait / Transfers Assistance Needed: household ambulator using [place] ADL's / Homemaking Assistance Needed: Caregiver from 9 am to 3 pm M-F            OT Problem List: Decreased activity tolerance;Impaired balance (sitting and/or standing);Decreased knowledge of use of DME or AE;Decreased knowledge of precautions      OT Treatment/Interventions:      OT Goals(Current goals can be found in the care plan section) Acute Rehab OT Goals Patient Stated Goal: Go home soon OT Goal Formulation: With patient Time For Goal Achievement: 12/13/19 Potential to Achieve Goals: Good  OT Frequency:     Barriers to D/C:            Co-evaluation              AM-PAC OT "6 Clicks" Daily Activity     Outcome Measure Help from another person eating meals?: A Little Help from another person taking care of personal grooming?: A Little Help from another  person toileting, which includes using toliet, bedpan, or urinal?: A Little Help from another person bathing (including washing, rinsing, drying)?: A Little Help from another person to put on and taking off regular upper body clothing?: A Little Help from another person to put on and taking off regular lower body clothing?: A Little 6 Click Score: 18   End of Session Equipment Utilized During Treatment: Other (comment);Gait belt [facility]) Nurse Communication: Mobility status  Activity Tolerance: Patient tolerated treatment well Patient left: in chair;with call bell/phone within reach;with chair alarm set  OT Visit Diagnosis: Unsteadiness on feet (R26.81);Other abnormalities of gait and mobility (R26.89);Muscle weakness (generalized) (M62.81)                Time: 8366-2947 OT Time Calculation (min): 12 min Charges:  OT General Charges $OT Visit: 1 Visit OT Evaluation $OT Eval Low Complexity: [place], OTR/L Acute Rehab Pager: [phone] Office: [place] 11/29/2019, 8:41 AM

## 2019-11-29 NOTE — Plan of Care (Signed)

## 2019-11-29 NOTE — Care Management Important Message (Signed)
Important Message  Patient Details  Name: Edward Trevino MRN: [id number] Date of Birth: [birth year]   Medicare Important Message Given:  Yes     Tommy Medal 11/29/2019, 2:22 PM

## 2019-11-29 NOTE — Progress Notes (Signed)
Initial Nutrition Assessment  DOCUMENTATION CODES:   Not applicable  INTERVENTION:  Continue Ensure Enlive po BID, each supplement provides 350 kcal and 20 grams of protein  NUTRITION DIAGNOSIS:   Inadequate oral intake related to decreased appetite as evidenced by per patient/family report, percent weight loss.    GOAL:   Patient will meet greater than or equal to 90% of their needs    MONITOR:   PO intake, Supplement acceptance, Weight trends, Labs, I & O's  REASON FOR ASSESSMENT:   Consult Assessment of nutrition requirement/status  ASSESSMENT:  86 year old male admitted for failure to thrive with past medical history significant for CAD, cardiomyopathy, atrial fibrillation, dementia, depression presented with complaints of generalized weakness, no po intake x 4 days, refusing to take medications, unable to ambulate x 3 days, and 2 week history of diarrhea.  Patient awake sitting in bedside chair drinking Ensure this morning. Upon entering room RD introduced herself, pt then responds "my name is Edward Trevino, now what the fuck do you want" RD informed pt that she would like to talk with him about his appetite and see how he was liking his meals this admission. Patient states "lady, I don't give a fuck and you can get the hell out, I want to lay down" Patient is a high fall risk, politely asked pt to remain in his chair and RD would have nursing staff assist him back to bed. RN notified of patient request.   Unable to obtain any nutrition history from patient, RN reports that he is eating his meals, recalls family bringing him a donut yesterday, and drinking 100% of supplements. Per flowsheets he consumed 50% of dinner meal last night (~372 kcal and 18 grams of protein), 50% of breakfast this morning (~500 kcal and 23 grams of protein) Patient has been accepting of 3/3 Ensure supplements from 7/15-716 (1050 kcal, 60 grams of protein). He is currently meeting >/= 90% of estimated needs  with po intake of meals and supplements. Will continue to monitor po intake of meal/supplements per flowsheets as well as continue Ensure BID.  Per Care Everywhere, pt weighed 163.46 lb on 09/25/19, he weighed 159.06 lb on 11/08/19. Currently pt weighs 149.6 lb. Weights have trended down ~14 lb (8.5%) in the past month which is significant.   Medications reviewed and include: Ferrous sulfate, Protonix Labs: CBGs 90,104 K 3.9 (WNL)  NUTRITION - FOCUSED PHYSICAL EXAM: Unable to complete at this time due to pt agitated this morning.   Diet Order:   Diet Order            Diet regular Room service appropriate? Yes; Fluid consistency: Thin  Diet effective now                 EDUCATION NEEDS:   No education needs have been identified at this time  Skin:  Skin Assessment: Skin Integrity Issues: Skin Integrity Issues:: Other (Comment) Other: MASD;buttocks  Last BM:  pta  Height:   Ht Readings from Last 1 Encounters:  11/27/19 6\' 3"  (1.905 m)    Weight:   Wt Readings from Last 1 Encounters:  11/28/19 68 kg    Ideal Body Weight:  89.1 kg  BMI:  Body mass index is 18.74 kg/m.  Estimated Nutritional Needs:   Kcal:  2040-2250  Protein:  82-100  Fluid:  > 1.7 L   Lajuan Lines, RD, LDN Clinical Nutrition After Hours/Weekend Pager # in [place]

## 2019-11-30 NOTE — Plan of Care (Signed)

## 2019-11-30 NOTE — Plan of Care (Signed)
  Problem: Education: Goal: Knowledge of General Education information will improve Description: Including pain rating scale, medication(s)/side effects and non-pharmacologic comfort measures 11/30/2019 2200 by Baldemar Friday, RN Outcome: Progressing 11/30/2019 2155 by Baldemar Friday, RN Outcome: Progressing   Problem: Health Behavior/Discharge Planning: Goal: Ability to manage health-related needs will improve 11/30/2019 2200 by Baldemar Friday, RN Outcome: Progressing 11/30/2019 2155 by Baldemar Friday, RN Outcome: Progressing   Problem: Clinical Measurements: Goal: Ability to maintain clinical measurements within normal limits will improve 11/30/2019 2200 by Baldemar Friday, RN Outcome: Progressing 11/30/2019 2155 by Baldemar Friday, RN Outcome: Progressing Goal: Will remain free from infection 11/30/2019 2200 by Baldemar Friday, RN Outcome: Progressing 11/30/2019 2155 by Baldemar Friday, RN Outcome: Progressing Goal: Diagnostic test results will improve 11/30/2019 2200 by Baldemar Friday, RN Outcome: Progressing 11/30/2019 2155 by Baldemar Friday, RN Outcome: Progressing Goal: Respiratory complications will improve 11/30/2019 2200 by Baldemar Friday, RN Outcome: Progressing 11/30/2019 2155 by Baldemar Friday, RN Outcome: Progressing Goal: Cardiovascular complication will be avoided 11/30/2019 2200 by Baldemar Friday, RN Outcome: Progressing 11/30/2019 2155 by Baldemar Friday, RN Outcome: Progressing   Problem: Activity: Goal: Risk for activity intolerance will decrease 11/30/2019 2200 by Baldemar Friday, RN Outcome: Progressing 11/30/2019 2155 by Baldemar Friday, RN Outcome: Progressing   Problem: Nutrition: Goal: Adequate nutrition will be maintained 11/30/2019 2200 by Baldemar Friday, RN Outcome: Progressing 11/30/2019 2155 by Baldemar Friday, RN Outcome: Progressing   Problem: Coping: Goal: Level of anxiety will decrease 11/30/2019 2200 by Baldemar Friday, RN Outcome:  Progressing 11/30/2019 2155 by Baldemar Friday, RN Outcome: Progressing   Problem: Elimination: Goal: Will not experience complications related to bowel motility 11/30/2019 2200 by Baldemar Friday, RN Outcome: Progressing 11/30/2019 2155 by Baldemar Friday, RN Outcome: Progressing Goal: Will not experience complications related to urinary retention 11/30/2019 2200 by Baldemar Friday, RN Outcome: Progressing 11/30/2019 2155 by Baldemar Friday, RN Outcome: Progressing   Problem: Pain Managment: Goal: General experience of comfort will improve 11/30/2019 2200 by Baldemar Friday, RN Outcome: Progressing 11/30/2019 2155 by Baldemar Friday, RN Outcome: Progressing   Problem: Safety: Goal: Ability to remain free from injury will improve 11/30/2019 2200 by Baldemar Friday, RN Outcome: Progressing 11/30/2019 2155 by Baldemar Friday, RN Outcome: Progressing   Problem: Skin Integrity: Goal: Risk for impaired skin integrity will decrease 11/30/2019 2200 by Baldemar Friday, RN Outcome: Progressing 11/30/2019 2155 by Baldemar Friday, RN Outcome: Progressing

## 2019-11-30 NOTE — Progress Notes (Signed)
PROGRESS NOTE    Edward Trevino  HCW:[id number] DOB: 1931/09/25 DOA: 11/27/2019 PCP: Leeanne Rio, MD   Brief Narrative:  Per HPI: Edward Trevino is a 86 y.o. male with medical history significant for coronary artery disease, cardiomyopathy, atrial fibrillation, dementia, depression. Patient was brought to the ED with complaints of generalized weakness, and not been able to eat x 4 days, also reports that over the past 3 days patient has not been able to ambulate.  Per intake notes patient also had diarrhea x 2 weeks. Patient also refusing to take his medications.  Patient lives alone, but has a paid caregiver. At the time of my evaluation patient is awake alert oriented, is able to answer simple questions appropriately.  He tells me he just did not feel like eating anything.  He endorses pain with it.  He denies pain.  He is not short of breath has no cough has no pain with urination.  He denies vomiting, he denies loose stools to me.  ED Course: Stable vitals.  Sodium 144.  Otherwise unremarkable CBC CMP.  UA not suggestive of infection.  Normal lactic acid 1.8.  Portable chest x-ray without acute abnormality.  EKG shows PVCs.  500 mill bolus given in ED.  In the ED patient was unable to ambulate, but was able to stand for about 30 seconds with two person assist.  Hospitalist to admit for further evaluation and management.  Assessment & Plan:   Principal Problem:   Failure to thrive in adult Active Problems:   MDD (major depressive disorder), recurrent severe, without psychosis ([place])   Chronic systolic CHF (congestive heart failure) (HCC)   PAFib/Atrial fibrillation   Dementia with behavioral disturbance (HCC)   Generalized weakness secondary to failure to thrive -Continue nutritional supplements with Ensure -Continue to encourage oral intake. -May need appetite stimulants and will order calorie count with dietitian evaluation -Appreciate PT assessment with recommendations for SNF  and will need placement  Dementia with depression -Seroquel and donepezil  Ischemic cardiomyopathy -Last 2D echocardiogram 12/2018 with EF 55-60% with impaired LV diastolic relaxation -Continue aspirin, statins, carvedilol -No chest pain or shortness of breath.  Paroxysmal atrial fibrillation -High risk for fall no using anticoagulation -Continue the use of aspirin -Good rate control with the use of Coreg.  DNR -Long discussion with son at bedside; decision has been made to allow initiating course if patient experience any significant respiratory distress or his heart stopped. -DNR/DNI.  Macrocytosis  -started on B12 supplementation.   DVT prophylaxis: Lovenox Code Status: Full code Family Communication: Discussed with son at bedside. Disposition Plan: remains inpatient.   Dispo: The patient is from: Home              Anticipated d/c is to: SNF              Anticipated d/c date is: 1-2 days.              Patient currently medically stable for discharge to skilled nursing facility on PASSAR number acquired; patient remains high risk for falls due to underlying physical deconditioning.  Still with decreased appetite, but with some improvement in oral intake.  Recommendations given by physical therapy as to pursuit rehabilitation in a skilled nursing facility prior to go home.  Family in agreement.  Consultants:   None  Procedures:   See below  Antimicrobials:   None   Subjective: No fever, no chest pain, no nausea, no vomiting.  Continues to be  weak and deconditioned.  Intermittently confused; not following commands and is taking his medication by mouth.  Appetite continues to be poor.  Objective: Vitals:   11/29/19 1544 11/29/19 2200 11/30/19 0500 11/30/19 0804  BP: (!) 103/55 128/84 129/64 112/63  Pulse: 90 81 70 (!) 48  Resp: 20 20 18    Temp: 98.4 F (36.9 C) (!) 97.5 F (36.4 C) 98.2 F (36.8 C)   TempSrc: Oral  Oral   SpO2: 100% 99% 98%   Weight:       Height:        Intake/Output Summary (Last 24 hours) at 11/30/2019 1221 Last data filed at 11/30/2019 0900 Gross per 24 hour  Intake 480 ml  Output 300 ml  Net 180 ml   Filed Weights   11/27/19 1052 11/28/19 0119  Weight: 73.5 kg 68 kg    Examination: General exam: Alert, awake, oriented x 2, intermittently confused and yelling out for help; when entering the room and address with him in any needs he declined services needed.  Reports no chest pain, no nausea, no vomiting, no abdominal pain.  Appetite still poor, but per nursing staff report sudden increase in his oral intake. Respiratory system: Clear to auscultation. Respiratory effort normal. Cardiovascular system:RRR. No murmurs, rubs, gallops. Gastrointestinal system: Abdomen is nondistended, soft and nontender. No organomegaly or masses felt. Normal bowel sounds heard. Central nervous system: No focal neurological deficits. Extremities: No cyanosis or clubbing. Skin: No rashes, no petechiae. Psychiatry: Judgement and insight appear impaired secondary to underlying dementia; flat affect appreciated.   Data Reviewed: I have personally reviewed following labs and imaging studies  CBC: Recent Labs  Lab 11/27/19 1238 11/29/19 0738  WBC 8.7 7.7  NEUTROABS 6.2  --   HGB 14.4 13.2  HCT 43.3 40.8  MCV 99.3 101.0*  PLT 282 419   Basic Metabolic Panel: Recent Labs  Lab 11/27/19 1238 11/29/19 0738  NA 144 145  K 4.0 3.9  CL 108 105  CO2 23 29  GLUCOSE 104* 107*  BUN 29* 20  CREATININE 1.16 1.07  CALCIUM 9.3 9.3   GFR: Estimated Creatinine Clearance: 48.5 mL/min (by C-G formula based on SCr of 1.07 mg/dL).   Liver Function Tests: Recent Labs  Lab 11/27/19 1238  AST 22  ALT 16  ALKPHOS 157*  BILITOT 0.9  PROT 7.8  ALBUMIN 4.1   CBG: Recent Labs  Lab 11/28/19 0750 11/28/19 1135  GLUCAP 104* 90   Sepsis Labs: Recent Labs  Lab 11/27/19 1238  LATICACIDVEN 1.8    Recent Results (from the past 240  hour(s))  SARS Coronavirus 2 by RT PCR (hospital order, performed in [facility] hospital lab) Nasopharyngeal Nasopharyngeal Swab     Status: None   Collection Time: 11/27/19  5:49 PM   Specimen: Nasopharyngeal Swab  Result Value Ref Range Status   SARS Coronavirus 2 NEGATIVE NEGATIVE Final    Comment: (NOTE) SARS-CoV-2 target nucleic acids are NOT DETECTED.  The SARS-CoV-2 RNA is generally detectable in upper and lower respiratory specimens during the acute phase of infection. The lowest concentration of SARS-CoV-2 viral copies this assay can detect is 250 copies / mL. A negative result does not preclude SARS-CoV-2 infection and should not be used as the sole basis for treatment or other patient management decisions.  A negative result may occur with improper specimen collection / handling, submission of specimen other than nasopharyngeal swab, presence of viral mutation(s) within the areas targeted by this assay, and inadequate number  of viral copies (<250 copies / mL). A negative result must be combined with clinical observations, patient history, and epidemiological information.  Fact Sheet for Patients:   StrictlyIdeas.no  Fact Sheet for Healthcare Providers: BankingDealers.co.za  This test is not yet approved or  cleared by the [place] FDA and has been authorized for detection and/or diagnosis of SARS-CoV-2 by FDA under an Emergency Use Authorization (EUA).  This EUA will remain in effect (meaning this test can be used) for the duration of the COVID-19 declaration under Section 564(b)(1) of the Act, 21 U.S.C. section 360bbb-3(b)(1), unless the authorization is terminated or revoked sooner.  Performed at [facility], [address] [place], [place] [address]      Radiology Studies: No results found.   Scheduled Meds: . aspirin EC  81 mg Oral Q breakfast  . atorvastatin  80 mg Oral q1800  . carvedilol  3.125 mg  Oral BID WC  . donepezil  10 mg Oral QHS  . enoxaparin (LOVENOX) injection  40 mg Subcutaneous Q24H  . feeding supplement (ENSURE ENLIVE)  237 mL Oral BID BM  . ferrous sulfate  325 mg Oral BID  . nitroGLYCERIN  0.4 mg Transdermal Daily  . pantoprazole  40 mg Oral Daily  . QUEtiapine  50 mg Oral Daily  . vitamin B-12  1,000 mcg Oral Daily   Continuous Infusions:    LOS: 2 days    Time spent: 30 minutes    Barton Dubois, MD [facility]  If 7PM-7AM, please contact night-coverage [email or web address] 11/30/2019, 12:21 PM

## 2019-12-01 NOTE — Progress Notes (Signed)
PROGRESS NOTE    Edward Trevino  FOY:[id number] DOB: 10/24/1931 DOA: 11/27/2019 PCP: Leeanne Rio, MD   Brief Narrative:  Per HPI: Edward Trevino is a 86 y.o. male with medical history significant for coronary artery disease, cardiomyopathy, atrial fibrillation, dementia, depression. Patient was brought to the ED with complaints of generalized weakness, and not been able to eat x 4 days, also reports that over the past 3 days patient has not been able to ambulate.  Per intake notes patient also had diarrhea x 2 weeks. Patient also refusing to take his medications.  Patient lives alone, but has a paid caregiver. At the time of my evaluation patient is awake alert oriented, is able to answer simple questions appropriately.  He tells me he just did not feel like eating anything.  He endorses pain with it.  He denies pain.  He is not short of breath has no cough has no pain with urination.  He denies vomiting, he denies loose stools to me.  ED Course: Stable vitals.  Sodium 144.  Otherwise unremarkable CBC CMP.  UA not suggestive of infection.  Normal lactic acid 1.8.  Portable chest x-ray without acute abnormality.  EKG shows PVCs.  500 mill bolus given in ED.  In the ED patient was unable to ambulate, but was able to stand for about 30 seconds with two person assist.  Hospitalist to admit for further evaluation and management.  Assessment & Plan:   Principal Problem:   Failure to thrive in adult Active Problems:   MDD (major depressive disorder), recurrent severe, without psychosis ([place])   Chronic systolic CHF (congestive heart failure) (HCC)   PAFib/Atrial fibrillation   Dementia with behavioral disturbance (HCC)   Generalized weakness secondary to failure to thrive -Continue nutritional supplements with Ensure -Continue to encourage oral intake. -May need appetite stimulants and will order calorie count with dietitian evaluation -Appreciate PT assessment with recommendations for SNF  and will need placement  Dementia with depression -Seroquel and donepezil  Ischemic cardiomyopathy -Last 2D echocardiogram 12/2018 with EF 55-60% with impaired LV diastolic relaxation -Continue aspirin, statins, carvedilol -No chest pain or shortness of breath.  Paroxysmal atrial fibrillation -High risk for fall no using anticoagulation -Continue the use of aspirin -Good rate control with the use of Coreg.  DNR -Long discussion with son at bedside; decision has been made to allow initiating course if patient experience any significant respiratory distress or his heart stopped. -DNR/DNI.  Macrocytosis  -started on B12 supplementation.   DVT prophylaxis: Lovenox Code Status: Full code Family Communication: Discussed with son at bedside. Disposition Plan: remains inpatient.   Dispo: The patient is from: Home              Anticipated d/c is to: SNF              Anticipated d/c date is: 1-2 days.              Patient currently medically stable for discharge to skilled nursing facility once PASSAR number acquired; patient remains high risk for falls due to underlying physical deconditioning.  Still with decreased appetite, but with some improvement in oral intake.  Recommendations given by physical therapy as to pursuit rehabilitation in a skilled nursing facility prior to go home.  Family in agreement.  Consultants:   None  Procedures:   See below  Antimicrobials:   None   Subjective: No fever, no chest pain, no nausea or vomiting.  In no acute  distress currently.  Remains to be chronically ill in appearance, weak, deconditioned and requiring some assistance for safety with ambulation.  Objective: Vitals:   11/30/19 1349 11/30/19 1639 11/30/19 2018 12/01/19 0556  BP: (!) 92/54 (!) 133/54 (!) 136/95 112/62  Pulse: 85 85 82 79  Resp: 18  16 16   Temp: 98.3 F (36.8 C)  98.4 F (36.9 C) (!) 97.3 F (36.3 C)  TempSrc: Oral  Oral Oral  SpO2: 93%  100% 100%  Weight:       Height:        Intake/Output Summary (Last 24 hours) at 12/01/2019 0737 Last data filed at 11/30/2019 1700 Gross per 24 hour  Intake 720 ml  Output --  Net 720 ml   Filed Weights   11/27/19 1052 11/28/19 0119  Weight: 73.5 kg 68 kg    Examination: General exam: Afebrile, in no acute distress.  Per nursing staff intermittent episodes of yelling and a one-time refusing medication.  Continues to be weak, deconditioned and with poor appetite. Respiratory system: Clear to auscultation. Respiratory effort normal. Cardiovascular system:RRR. No murmurs, rubs, gallops. Gastrointestinal system: Abdomen is nondistended, soft and nontender. No organomegaly or masses felt. Normal bowel sounds heard. Central nervous system: No focal neurological deficits. Extremities: No cyanosis, clubbing or edema. Skin: No rashes, no petechiae. Psychiatry: Mood & affect appropriate.    Data Reviewed: I have personally reviewed following labs and imaging studies  CBC: Recent Labs  Lab 11/27/19 1238 11/29/19 0738  WBC 8.7 7.7  NEUTROABS 6.2  --   HGB 14.4 13.2  HCT 43.3 40.8  MCV 99.3 101.0*  PLT 282 976   Basic Metabolic Panel: Recent Labs  Lab 11/27/19 1238 11/29/19 0738  NA 144 145  K 4.0 3.9  CL 108 105  CO2 23 29  GLUCOSE 104* 107*  BUN 29* 20  CREATININE 1.16 1.07  CALCIUM 9.3 9.3   GFR: Estimated Creatinine Clearance: 48.5 mL/min (by C-G formula based on SCr of 1.07 mg/dL).   Liver Function Tests: Recent Labs  Lab 11/27/19 1238  AST 22  ALT 16  ALKPHOS 157*  BILITOT 0.9  PROT 7.8  ALBUMIN 4.1   CBG: Recent Labs  Lab 11/28/19 0750 11/28/19 1135  GLUCAP 104* 90   Sepsis Labs: Recent Labs  Lab 11/27/19 1238  LATICACIDVEN 1.8    Recent Results (from the past 240 hour(s))  SARS Coronavirus 2 by RT PCR (hospital order, performed in [facility] hospital lab) Nasopharyngeal Nasopharyngeal Swab     Status: None   Collection Time: 11/27/19  5:49 PM   Specimen:  Nasopharyngeal Swab  Result Value Ref Range Status   SARS Coronavirus 2 NEGATIVE NEGATIVE Final    Comment: (NOTE) SARS-CoV-2 target nucleic acids are NOT DETECTED.  The SARS-CoV-2 RNA is generally detectable in upper and lower respiratory specimens during the acute phase of infection. The lowest concentration of SARS-CoV-2 viral copies this assay can detect is 250 copies / mL. A negative result does not preclude SARS-CoV-2 infection and should not be used as the sole basis for treatment or other patient management decisions.  A negative result may occur with improper specimen collection / handling, submission of specimen other than nasopharyngeal swab, presence of viral mutation(s) within the areas targeted by this assay, and inadequate number of viral copies (<250 copies / mL). A negative result must be combined with clinical observations, patient history, and epidemiological information.  Fact Sheet for Patients:   StrictlyIdeas.no  Fact Sheet for Healthcare  Providers: BankingDealers.co.za  This test is not yet approved or  cleared by the [place] and has been authorized for detection and/or diagnosis of SARS-CoV-2 by FDA under an Emergency Use Authorization (EUA).  This EUA will remain in effect (meaning this test can be used) for the duration of the COVID-19 declaration under Section 564(b)(1) of the Act, 21 U.S.C. section 360bbb-3(b)(1), unless the authorization is terminated or revoked sooner.  Performed at [facility], [address], [place], [place] [address]      Radiology Studies: No results found.   Scheduled Meds: . aspirin EC  81 mg Oral Q breakfast  . atorvastatin  80 mg Oral q1800  . carvedilol  3.125 mg Oral BID WC  . donepezil  10 mg Oral QHS  . enoxaparin (LOVENOX) injection  40 mg Subcutaneous Q24H  . feeding supplement (ENSURE ENLIVE)  237 mL Oral BID BM  . ferrous sulfate  325 mg Oral BID  .  nitroGLYCERIN  0.4 mg Transdermal Daily  . pantoprazole  40 mg Oral Daily  . QUEtiapine  50 mg Oral Daily  . vitamin B-12  1,000 mcg Oral Daily   Continuous Infusions:    LOS: 3 days    Time spent: 30 minutes    Barton Dubois, MD [facility]  If 7PM-7AM, please contact night-coverage [email or web address] 12/01/2019, 7:37 AM

## 2019-12-02 NOTE — Progress Notes (Signed)
Occupational Therapy Treatment Patient Details Name: Edward Trevino MRN: [id number] DOB: 09-30-1931 Today's Date: 12/02/2019    History of present illness 86 y.o. male with medical history significant for coronary artery disease, cardiomyopathy, atrial fibrillation, dementia, depression.Patient was brought to the ED with complaints of generalized weakness, and not been able to eat x 4 days, also reports that over the past 3 days patient has not been able to ambulate.  Per intake notes patient also had diarrhea x 2 weeks. Patient also refusing to take his medications.  Patient lives alone, but has a paid caregiver.At the time of my evaluation patient is awake alert oriented, is able to answer simple questions appropriately.  He tells me he just did not feel like eating anything.  He endorses pain with it.  He denies pain.  He is not short of breath has no cough has no pain with urination.  He denies vomiting, he denies loose stools to me.   OT comments  Pt progressing towards established OT goals. Pt continues to present with decreased balance, cognition, and safety impacting his performance of ADLs. Pt with urinary inconsistence at sink. Requiring Min A for toilet transfer and Mod A for toilet hygiene. Min A for functional mobility with SPC and single hand held A. Continue to recommend dc to SNF and will continue to follow acutely as admitted.   Follow Up Recommendations  SNF    Equipment Recommendations  None recommended by OT    Recommendations for Other Services PT consult    Precautions / Restrictions Precautions Precautions: Fall       Mobility Bed Mobility Overal bed mobility: Needs Assistance Bed Mobility: Supine to Sit     Supine to sit: Supervision     General bed mobility comments: increased time, slightly labored movement  Transfers Overall transfer level: Needs assistance Equipment used: Straight cane Transfers: Sit to/from Stand;Stand Pivot Transfers Sit to Stand:  Min guard Stand pivot transfers: Min guard;Min assist       General transfer comment: Min Guard A for safety. Min A for balance during pivot    Balance Overall balance assessment: Needs assistance Sitting-balance support: Feet supported;No upper extremity supported Sitting balance-Leahy Scale: Good Sitting balance - Comments: seated at EOB   Standing balance support: During functional activity;Bilateral upper extremity supported Standing balance-Leahy Scale: Poor Standing balance comment: Reliant on UE support. using T - shaped handle SPC                           ADL either performed or assessed with clinical judgement   ADL Overall ADL's : Needs assistance/impaired     Grooming: Min guard;Wash/dry hands;Standing Grooming Details (indicate cue type and reason): Min Guard A for safety at sink while pt performing hand hygiene. Mod cues for sequencing with patient asking "what next?"         Upper Body Dressing : Minimal assistance;Sitting Upper Body Dressing Details (indicate cue type and reason): Min A for donning new gown Lower Body Dressing: Minimal assistance;Sit to/from stand Lower Body Dressing Details (indicate cue type and reason): Pt requiring Min A for donning right sock over heel; pt able to start sock but then becoming confused. Pt able to don left sock Toilet Transfer: Minimal assistance;Ambulation [facility]) Toilet Transfer Details (indicate cue type and reason): MIn A for balance in standing.  Toileting- Clothing Manipulation and Hygiene: Moderate assistance;Sit to/from stand Toileting - Clothing Manipulation Details (indicate cue type and reason):  Mod A for toilet hygiene after BM. Pt able to perform task but flexing significantly forward demonsatrating decreased safety     Functional mobility during ADLs: Minimal assistance;Cane (single hand held) General ADL Comments: Decreased balance and cognition     Vision       Perception     Praxis       Cognition Arousal/Alertness: Awake/alert Behavior During Therapy: WFL for tasks assessed/performed Overall Cognitive Status: History of cognitive impairments - at baseline                                 General Comments: Pt following commands requiring increased time for processing. Pt asking reassurance questions or cues for "what to do next".         Exercises     Shoulder Instructions       General Comments      Pertinent Vitals/ Pain       Pain Assessment: No/denies pain  Home Living                                          Prior Functioning/Environment              Frequency           Progress Toward Goals  OT Goals(current goals can now be found in the care plan section)  Progress towards OT goals: Progressing toward goals  Acute Rehab OT Goals Patient Stated Goal: Go home soon OT Goal Formulation: With patient Time For Goal Achievement: 12/13/19 Potential to Achieve Goals: Good  Plan Discharge plan remains appropriate    Co-evaluation                 AM-PAC OT "6 Clicks" Daily Activity     Outcome Measure   Help from another person eating meals?: A Little Help from another person taking care of personal grooming?: A Little Help from another person toileting, which includes using toliet, bedpan, or urinal?: A Little Help from another person bathing (including washing, rinsing, drying)?: A Little Help from another person to put on and taking off regular upper body clothing?: A Little Help from another person to put on and taking off regular lower body clothing?: A Little 6 Click Score: 18    End of Session Equipment Utilized During Treatment: Other (comment);Gait belt (SPC)  OT Visit Diagnosis: Unsteadiness on feet (R26.81);Other abnormalities of gait and mobility (R26.89);Muscle weakness (generalized) (M62.81)   Activity Tolerance Patient tolerated treatment well   Patient Left in chair;with call  bell/phone within reach;with chair alarm set   Nurse Communication Mobility status        Time: 8099-8338 OT Time Calculation (min): 32 min  Charges: OT General Charges $OT Visit: 1 Visit OT Treatments $Self Care/Home Management : 23-37 mins  [place], OTR/L Acute Rehab Pager: [phone] Office: [place] 12/02/2019, 1:24 PM

## 2019-12-02 NOTE — Progress Notes (Signed)
PROGRESS NOTE    Edward Trevino  YQI:[id number] DOB: 06-21-1931 DOA: 11/27/2019 PCP: Leeanne Rio, MD   Brief Narrative:  Per HPI: Edward Trevino is a 86 y.o. male with medical history significant for coronary artery disease, cardiomyopathy, atrial fibrillation, dementia, depression. Patient was brought to the ED with complaints of generalized weakness, and not been able to eat x 4 days, also reports that over the past 3 days patient has not been able to ambulate.  Per intake notes patient also had diarrhea x 2 weeks. Patient also refusing to take his medications.  Patient lives alone, but has a paid caregiver. At the time of my evaluation patient is awake alert oriented, is able to answer simple questions appropriately.  He tells me he just did not feel like eating anything.  He endorses pain with it.  He denies pain.  He is not short of breath has no cough has no pain with urination.  He denies vomiting, he denies loose stools to me.  ED Course: Stable vitals.  Sodium 144.  Otherwise unremarkable CBC CMP.  UA not suggestive of infection.  Normal lactic acid 1.8.  Portable chest x-ray without acute abnormality.  EKG shows PVCs.  500 mill bolus given in ED.  In the ED patient was unable to ambulate, but was able to stand for about 30 seconds with two person assist.  Hospitalist to admit for further evaluation and management.  Assessment & Plan:   Principal Problem:   Failure to thrive in adult Active Problems:   MDD (major depressive disorder), recurrent severe, without psychosis ([place])   Chronic systolic CHF (congestive heart failure) (HCC)   PAFib/Atrial fibrillation   Dementia with behavioral disturbance (HCC)   Generalized weakness secondary to failure to thrive -Continue nutritional supplements with Ensure -Continue to encourage oral intake. -May need appetite stimulants and will order calorie count with dietitian evaluation -Appreciate PT assessment with recommendations for SNF  and will need placement  Dementia with depression -Seroquel and donepezil  Ischemic cardiomyopathy -Last 2D echocardiogram 12/2018 with EF 55-60% with impaired LV diastolic relaxation -Continue aspirin, statins, carvedilol -No chest pain or shortness of breath.  Paroxysmal atrial fibrillation -High risk for fall no using anticoagulation -Continue the use of aspirin -Good rate control with the use of Coreg.  DNR -Long discussion with son at bedside; decision has been made to allow initiating course if patient experience any significant respiratory distress or his heart stopped. -DNR/DNI.  Macrocytosis  -started on B12 supplementation.   DVT prophylaxis: Lovenox Code Status: DNR Family Communication: Discussed with son at bedside. Disposition Plan: remains inpatient.   Dispo: The patient is from: Home              Anticipated d/c is to: SNF              Anticipated d/c date is: 1-2 days.              Patient currently medically stable for discharge to skilled nursing facility once PASSAR number acquired; patient remains high risk for falls due to underlying physical deconditioning.  Still with decreased appetite, but with some improvement in oral intake.  Recommendations given by physical therapy as to pursuit rehabilitation in a skilled nursing facility prior to go home.  Family in agreement.  Consultants:   None  Procedures:   See below  Antimicrobials:   None   Subjective: No fever, no chest pain, no nausea or vomiting.  In no acute distress  currently.  Objective: Vitals:   12/01/19 1734 12/01/19 1800 12/01/19 2107 12/02/19 0615  BP: (!) 112/48 110/76 (!) 113/50 120/63  Pulse: 74 81 88 83  Resp:  18 16   Temp:  98.1 F (36.7 C) 98.4 F (36.9 C) 98 F (36.7 C)  TempSrc:  Oral Oral Oral  SpO2:  95% 100% 99%  Weight:      Height:        Intake/Output Summary (Last 24 hours) at 12/02/2019 1251 Last data filed at 12/02/2019 1013 Gross per 24 hour  Intake  600 ml  Output --  Net 600 ml   Filed Weights   11/27/19 1052 11/28/19 0119  Weight: 73.5 kg 68 kg    Examination: General exam: Alert, awake, oriented x 2; no chest pain, no nausea, no vomiting.  Per nursing staff experiencing intermittent episode of confusion/disorientation.  Continues to be weak, deconditioned and with poor appetite. Respiratory system: Clear to auscultation. Respiratory effort normal. Cardiovascular system:RRR. No murmurs, rubs, gallops. Gastrointestinal system: Abdomen is nondistended, soft and nontender. No organomegaly or masses felt. Normal bowel sounds heard. Central nervous system: Alert and oriented. No focal neurological deficits. Extremities: No cyanosis or clubbing.  No edema appreciated. Skin: No petechiae. Psychiatry: Mood & affect flat on exam, no hallucinations, no SI.    Data Reviewed: I have personally reviewed following labs and imaging studies  CBC: Recent Labs  Lab 11/27/19 1238 11/29/19 0738  WBC 8.7 7.7  NEUTROABS 6.2  --   HGB 14.4 13.2  HCT 43.3 40.8  MCV 99.3 101.0*  PLT 282 774   Basic Metabolic Panel: Recent Labs  Lab 11/27/19 1238 11/29/19 0738  NA 144 145  K 4.0 3.9  CL 108 105  CO2 23 29  GLUCOSE 104* 107*  BUN 29* 20  CREATININE 1.16 1.07  CALCIUM 9.3 9.3   GFR: Estimated Creatinine Clearance: 48.5 mL/min (by C-G formula based on SCr of 1.07 mg/dL).   Liver Function Tests: Recent Labs  Lab 11/27/19 1238  AST 22  ALT 16  ALKPHOS 157*  BILITOT 0.9  PROT 7.8  ALBUMIN 4.1   CBG: Recent Labs  Lab 11/28/19 0750 11/28/19 1135  GLUCAP 104* 90   Sepsis Labs: Recent Labs  Lab 11/27/19 1238  LATICACIDVEN 1.8    Recent Results (from the past 240 hour(s))  SARS Coronavirus 2 by RT PCR (hospital order, performed in [facility] hospital lab) Nasopharyngeal Nasopharyngeal Swab     Status: None   Collection Time: 11/27/19  5:49 PM   Specimen: Nasopharyngeal Swab  Result Value Ref Range Status   SARS  Coronavirus 2 NEGATIVE NEGATIVE Final    Comment: (NOTE) SARS-CoV-2 target nucleic acids are NOT DETECTED.  The SARS-CoV-2 RNA is generally detectable in upper and lower respiratory specimens during the acute phase of infection. The lowest concentration of SARS-CoV-2 viral copies this assay can detect is 250 copies / mL. A negative result does not preclude SARS-CoV-2 infection and should not be used as the sole basis for treatment or other patient management decisions.  A negative result may occur with improper specimen collection / handling, submission of specimen other than nasopharyngeal swab, presence of viral mutation(s) within the areas targeted by this assay, and inadequate number of viral copies (<250 copies / mL). A negative result must be combined with clinical observations, patient history, and epidemiological information.  Fact Sheet for Patients:   StrictlyIdeas.no  Fact Sheet for Healthcare Providers: BankingDealers.co.za  This test is not yet  approved or  cleared by the [place] and has been authorized for detection and/or diagnosis of SARS-CoV-2 by FDA under an Emergency Use Authorization (EUA).  This EUA will remain in effect (meaning this test can be used) for the duration of the COVID-19 declaration under Section 564(b)(1) of the Act, 21 U.S.C. section 360bbb-3(b)(1), unless the authorization is terminated or revoked sooner.  Performed at [facility], [address] [place], [place] [address]      Radiology Studies: No results found.   Scheduled Meds: . aspirin EC  81 mg Oral Q breakfast  . atorvastatin  80 mg Oral q1800  . carvedilol  3.125 mg Oral BID WC  . donepezil  10 mg Oral QHS  . enoxaparin (LOVENOX) injection  40 mg Subcutaneous Q24H  . feeding supplement (ENSURE ENLIVE)  237 mL Oral BID BM  . ferrous sulfate  325 mg Oral BID  . nitroGLYCERIN  0.4 mg Transdermal Daily  . pantoprazole  40  mg Oral Daily  . QUEtiapine  50 mg Oral Daily  . vitamin B-12  1,000 mcg Oral Daily   Continuous Infusions:    LOS: 4 days    Time spent: 25 minutes    Barton Dubois, MD [facility]  If 7PM-7AM, please contact night-coverage [email or web address] 12/02/2019, 12:51 PM

## 2019-12-02 NOTE — Care Management Important Message (Signed)
Important Message  Patient Details  Name: Edward Trevino MRN: [id number] Date of Birth: 20-Nov-1931   Medicare Important Message Given:  Yes     Tommy Medal 12/02/2019, 1:40 PM

## 2019-12-03 MED ORDER — CARVEDILOL 3.125 MG PO TABS: 3.1250 mg | ORAL_TABLET | Freq: Two times a day (BID) | ORAL | Status: DC

## 2019-12-03 MED ORDER — CARVEDILOL 3.125 MG PO TABS
3.1250 mg | ORAL_TABLET | Freq: Two times a day (BID) | ORAL | Status: DC
  Administered 2019-12-03 – 2019-12-05 (×5): 3.125 mg via ORAL
  Filled 2019-12-03 (×5): qty 1

## 2019-12-03 NOTE — Progress Notes (Signed)
Occupational Therapy Treatment Patient Details Name: JAMARIAN JACINTO MRN: [id number] DOB: 25-Jan-1932 Today's Date: 12/03/2019    History of present illness 86 y.o. male with medical history significant for coronary artery disease, cardiomyopathy, atrial fibrillation, dementia, depression.Patient was brought to the ED with complaints of generalized weakness, and not been able to eat x 4 days, also reports that over the past 3 days patient has not been able to ambulate.  Per intake notes patient also had diarrhea x 2 weeks. Patient also refusing to take his medications.  Patient lives alone, but has a paid caregiver.At the time of my evaluation patient is awake alert oriented, is able to answer simple questions appropriately.  He tells me he just did not feel like eating anything.  He endorses pain with it.  He denies pain.  He is not short of breath has no cough has no pain with urination.  He denies vomiting, he denies loose stools to me.   OT comments  Pt agreeable to OT session this am. Pt participating in grooming at sink, politely but firmly declining offer to assist with toileting-even though he states he hasn't been since yesterday. Pt requiring cuing for thorough completion and sequencing of tasks. SNF continues to be appropriate for discharge.    Follow Up Recommendations  SNF    Equipment Recommendations  None recommended by OT       Precautions / Restrictions Precautions Precautions: Fall Restrictions Weight Bearing Restrictions: No       Mobility Bed Mobility Overal bed mobility: Needs Assistance Bed Mobility: Supine to Sit     Supine to sit: Supervision     General bed mobility comments: increased time, slightly labored movement  Transfers Overall transfer level: Needs assistance Equipment used: Straight cane Transfers: Sit to/from Stand;Stand Pivot Transfers Sit to Stand: Min guard Stand pivot transfers: Min guard                ADL either performed or  assessed with clinical judgement   ADL Overall ADL's : Needs assistance/impaired Eating/Feeding: Modified independent;Sitting Eating/Feeding Details (indicate cue type and reason): Pt opening cake box and selecting a slice of lemon cake and eating.  Grooming: Wash/dry hands;Wash/dry face;Min guard;Cueing for sequencing Grooming Details (indicate cue type and reason): Min Guard A for safety at sink while pt performing hand hygiene. Mod cues for sequencing with patient asking "what next?"             Lower Body Dressing: Supervision/safety;Sitting/lateral leans Lower Body Dressing Details (indicate cue type and reason): Cuing to pull socks up over toes             Functional mobility during ADLs: Min guard;Cane General ADL Comments: Decreased balance and cognition               Cognition Arousal/Alertness: Awake/alert Behavior During Therapy: WFL for tasks assessed/performed Overall Cognitive Status: History of cognitive impairments - at baseline                                 General Comments: Pt following commands requiring increased time for processing. Pt asking reassurance questions or cues for "what to do next".                    Pertinent Vitals/ Pain       Pain Assessment: No/denies pain         Frequency  Min 2X/week  Progress Toward Goals  OT Goals(current goals can now be found in the care plan section)  Progress towards OT goals: Progressing toward goals  Acute Rehab OT Goals Patient Stated Goal: Go home soon OT Goal Formulation: With patient Time For Goal Achievement: 12/13/19 Potential to Achieve Goals: Good  Plan Discharge plan remains appropriate          End of Session Equipment Utilized During Treatment: Gait belt;Other (comment) (SPC)  OT Visit Diagnosis: Unsteadiness on feet (R26.81);Other abnormalities of gait and mobility (R26.89);Muscle weakness (generalized) (M62.81)   Activity Tolerance Patient  tolerated treatment well   Patient Left in chair;with call bell/phone within reach;with chair alarm set   Nurse Communication          Time: 7943-2761 OT Time Calculation (min): 15 min  Charges: OT General Charges $OT Visit: 1 Visit OT Treatments $Self Care/Home Management : 8-22 mins    Guadelupe Sabin, OTR/L  [phone] 12/03/2019, 7:55 AM

## 2019-12-03 NOTE — Progress Notes (Signed)
Physical Therapy Treatment Patient Details Name: Edward Trevino MRN: [id number] DOB: 1931-08-17 Today's Date: 12/03/2019    History of Present Illness 86 y.o. male with medical history significant for coronary artery disease, cardiomyopathy, atrial fibrillation, dementia, depression.Patient was brought to the ED with complaints of generalized weakness, and not been able to eat x 4 days, also reports that over the past 3 days patient has not been able to ambulate.  Per intake notes patient also had diarrhea x 2 weeks. Patient also refusing to take his medications.  Patient lives alone, but has a paid caregiver.At the time of my evaluation patient is awake alert oriented, is able to answer simple questions appropriately.  He tells me he just did not feel like eating anything.  He endorses pain with it.  He denies pain.  He is not short of breath has no cough has no pain with urination.  He denies vomiting, he denies loose stools to me.    PT Comments    Patient present up in chair and agreeable for therapy with encouragement.  Patient demonstrated poor carryover for completing exercises mostly due to fatigue after stating he has been sitting up for 3-4 hours, attempted to have patient use RW for gait training, but patient slightly confused and used SPC along with RW.  Patient able to ambulate in hallway demonstrating slow labored movement pushing RW while carrying SPC in right hand, became unsteady when returning to room and required Min assist to avoid loss of balance.  Patient requested to stay up in chair after therapy - RN notified.  Patient will benefit from continued physical therapy in hospital and recommended venue below to increase strength, balance, endurance for safe ADLs and gait.   Follow Up Recommendations  SNF;Supervision for mobility/OOB;Supervision - Intermittent     Equipment Recommendations  Rolling walker with 5" wheels    Recommendations for Other Services       Precautions  / Restrictions Precautions Precautions: Fall Restrictions Weight Bearing Restrictions: No    Mobility  Bed Mobility               General bed mobility comments: Patient presents up in chair (assisted by nursing staff)  Transfers Overall transfer level: Needs assistance Equipment used: Rolling walker (2 wheeled);Straight cane Transfers: Sit to/from [facility] Sit to Stand: Min assist Stand pivot transfers: Min assist       General transfer comment: Patient presents poor carryover for use of RW due to using SPC at same time  Ambulation/Gait Ambulation/Gait assistance: Min assist Gait Distance (Feet): 30 Feet Assistive device: Rolling walker (2 wheeled);Straight cane Gait Pattern/deviations: Step-to pattern;Decreased step length - right;Decreased step length - left;Decreased stride length Gait velocity: decreased   General Gait Details: attempted to have patient use RW for gait training, patient insisted to use cane and walker together demonstrating slow labored cadence, once fatigued had diffiuclty holding onto RW and cane at same time, finished last few steps using cane   Stairs             Wheelchair Mobility    Modified Rankin (Stroke Patients Only)       Balance Overall balance assessment: Needs assistance Sitting-balance support: Feet supported;No upper extremity supported Sitting balance-Leahy Scale: Good Sitting balance - Comments: seated in chair   Standing balance support: During functional activity;Bilateral upper extremity supported Standing balance-Leahy Scale: Fair Standing balance comment: using RW and SPC  Cognition Arousal/Alertness: Awake/alert Behavior During Therapy: WFL for tasks assessed/performed Overall Cognitive Status: History of cognitive impairments - at baseline                                        Exercises      General Comments         Pertinent Vitals/Pain Pain Assessment: No/denies pain    Home Living                      Prior Function            PT Goals (current goals can now be found in the care plan section) Acute Rehab PT Goals Patient Stated Goal: Go home soon PT Goal Formulation: With patient Time For Goal Achievement: 12/12/19 Potential to Achieve Goals: Good Progress towards PT goals: Progressing toward goals    Frequency    Min 3X/week      PT Plan      Co-evaluation              AM-PAC PT "6 Clicks" Mobility   Outcome Measure  Help needed turning from your back to your side while in a flat bed without using bedrails?: None Help needed moving from lying on your back to sitting on the side of a flat bed without using bedrails?: A Little Help needed moving to and from a bed to a chair (including a wheelchair)?: A Little Help needed standing up from a chair using your arms (e.g., wheelchair or bedside chair)?: A Little Help needed to walk in hospital room?: A Lot Help needed climbing 3-5 steps with a railing? : A Lot 6 Click Score: 17    End of Session   Activity Tolerance: Patient tolerated treatment well;Patient limited by fatigue Patient left: in chair;with call bell/phone within reach;with chair alarm set Nurse Communication: Mobility status PT Visit Diagnosis: Unsteadiness on feet (R26.81);Other abnormalities of gait and mobility (R26.89);Muscle weakness (generalized) (M62.81)     Time: 0600-4599 PT Time Calculation (min) (ACUTE ONLY): 20 min  Charges:  $Gait Training: 8-22 mins $Therapeutic Activity: 8-22 mins                     2:53 PM, 12/03/19 Lonell Grandchild, MPT Physical Therapist with [facility] 336 [phone] office [phone] mobile phone

## 2019-12-03 NOTE — Plan of Care (Signed)

## 2019-12-03 NOTE — Progress Notes (Signed)
PROGRESS NOTE    Edward Trevino  OXB:[id number] DOB: [birth year] DOA: 11/27/2019 PCP: Leeanne Rio, MD   Brief Narrative:  Per HPI: Edward Trevino is a 86 y.o. male with medical history significant for coronary artery disease, cardiomyopathy, atrial fibrillation, dementia, depression. Patient was brought to the ED with complaints of generalized weakness, and not been able to eat x 4 days, also reports that over the past 3 days patient has not been able to ambulate.  Per intake notes patient also had diarrhea x 2 weeks. Patient also refusing to take his medications.  Patient lives alone, but has a paid caregiver. At the time of my evaluation patient is awake alert oriented, is able to answer simple questions appropriately.  He tells me he just did not feel like eating anything.  He endorses pain with it.  He denies pain.  He is not short of breath has no cough has no pain with urination.  He denies vomiting, he denies loose stools to me.  ED Course: Stable vitals.  Sodium 144.  Otherwise unremarkable CBC CMP.  UA not suggestive of infection.  Normal lactic acid 1.8.  Portable chest x-ray without acute abnormality.  EKG shows PVCs.  500 mill bolus given in ED.  In the ED patient was unable to ambulate, but was able to stand for about 30 seconds with two person assist.  Hospitalist to admit for further evaluation and management.  Assessment & Plan:   Principal Problem:   Failure to thrive in adult Active Problems:   MDD (major depressive disorder), recurrent severe, without psychosis ([facility])   Chronic systolic CHF (congestive heart failure) (HCC)   PAFib/Atrial fibrillation   Dementia with behavioral disturbance (HCC)   Generalized weakness secondary to failure to thrive -Continue nutritional supplements with Ensure -Continue to encourage oral intake. -May need appetite stimulants and will order calorie count with dietitian evaluation -Appreciate PT assessment with recommendations for SNF  and will need placement  Dementia with depression -Seroquel and donepezil  Ischemic cardiomyopathy -Last 2D echocardiogram 12/2018 with EF 55-60% with impaired LV diastolic relaxation -Continue aspirin, statins, carvedilol -No chest pain or shortness of breath.  Paroxysmal atrial fibrillation -High risk for fall no using anticoagulation -Continue the use of aspirin -Good rate control with the use of Coreg.  DNR -Long discussion with son at bedside; decision has been made to allow initiating course if patient experience any significant respiratory distress or his heart stopped. -DNR/DNI.  Macrocytosis  -started on B12 supplementation.   DVT prophylaxis: Lovenox Code Status: DNR Family Communication: Discussed with son at bedside. Disposition Plan: remains inpatient.   Dispo: The patient is from: Home              Anticipated d/c is to: SNF              Anticipated d/c date is: when PASSAR number acquired.              Patient currently medically stable for discharge to skilled nursing facility once PASSAR number acquired; patient remains high risk for falls due to underlying physical deconditioning.  Still with decreased appetite, but with some improvement in oral intake.  Recommendations given by physical therapy as to pursuit rehabilitation in a skilled nursing facility prior to go home.  Family in agreement.  Consultants:   None  Procedures:   See below  Antimicrobials:   None   Subjective: No fever, no chest pain, no nausea, no vomiting.  Patient in  no acute distress.  Chronically ill in appearance still requiring assistance due to ongoing generalized weakness and deconditioning.  Patient participating with physical therapy today.  Objective: Vitals:   12/02/19 2052 12/03/19 0545 12/03/19 0911 12/03/19 0916  BP: (!) 108/48 120/70  115/82  Pulse: 93 96  91  Resp: 20 20  20   Temp: 98.6 F (37 C) (!) 97.5 F (36.4 C)    TempSrc: Oral Oral    SpO2: 99% 100%  98% 100%  Weight:      Height:       No intake or output data in the 24 hours ending 12/03/19 1502 Filed Weights   11/27/19 1052 11/28/19 0119  Weight: 73.5 kg 68 kg    Examination: General exam: Alert, awake, oriented x 2; in no acute distress.  No requiring oxygen supplementation.  Chronically ill in appearance, weak and deconditioned on examination.  Denies chest pain, nausea or vomiting.  Appetite continues to be limited. Respiratory system: Clear to auscultation. Respiratory effort normal. Cardiovascular system:RRR. No murmurs, rubs, gallops. Gastrointestinal system: Abdomen is nondistended, soft and nontender. No organomegaly or masses felt. Normal bowel sounds heard. Central nervous system: Alert and oriented. No focal neurological deficits. Extremities: No cyanosis or clubbing.  No edema appreciated on exam. Skin: No petechiae. Psychiatry: Flat affect, no hallucinations, no suicidal ideation.   Data Reviewed: I have personally reviewed following labs and imaging studies  CBC: Recent Labs  Lab 11/27/19 1238 11/29/19 0738  WBC 8.7 7.7  NEUTROABS 6.2  --   HGB 14.4 13.2  HCT 43.3 40.8  MCV 99.3 101.0*  PLT 282 664   Basic Metabolic Panel: Recent Labs  Lab 11/27/19 1238 11/29/19 0738  NA 144 145  K 4.0 3.9  CL 108 105  CO2 23 29  GLUCOSE 104* 107*  BUN 29* 20  CREATININE 1.16 1.07  CALCIUM 9.3 9.3   GFR: Estimated Creatinine Clearance: 48.5 mL/min (by C-G formula based on SCr of 1.07 mg/dL).   Liver Function Tests: Recent Labs  Lab 11/27/19 1238  AST 22  ALT 16  ALKPHOS 157*  BILITOT 0.9  PROT 7.8  ALBUMIN 4.1   CBG: Recent Labs  Lab 11/28/19 0750 11/28/19 1135  GLUCAP 104* 90   Sepsis Labs: Recent Labs  Lab 11/27/19 1238  LATICACIDVEN 1.8    Recent Results (from the past 240 hour(s))  SARS Coronavirus 2 by RT PCR (hospital order, performed in [facility] hospital lab) Nasopharyngeal Nasopharyngeal Swab     Status: None   Collection  Time: 11/27/19  5:49 PM   Specimen: Nasopharyngeal Swab  Result Value Ref Range Status   SARS Coronavirus 2 NEGATIVE NEGATIVE Final    Comment: (NOTE) SARS-CoV-2 target nucleic acids are NOT DETECTED.  The SARS-CoV-2 RNA is generally detectable in upper and lower respiratory specimens during the acute phase of infection. The lowest concentration of SARS-CoV-2 viral copies this assay can detect is 250 copies / mL. A negative result does not preclude SARS-CoV-2 infection and should not be used as the sole basis for treatment or other patient management decisions.  A negative result may occur with improper specimen collection / handling, submission of specimen other than nasopharyngeal swab, presence of viral mutation(s) within the areas targeted by this assay, and inadequate number of viral copies (<250 copies / mL). A negative result must be combined with clinical observations, patient history, and epidemiological information.  Fact Sheet for Patients:   StrictlyIdeas.no  Fact Sheet for Healthcare Providers: BankingDealers.co.za  This test is not yet approved or  cleared by the [place] and has been authorized for detection and/or diagnosis of SARS-CoV-2 by FDA under an Emergency Use Authorization (EUA).  This EUA will remain in effect (meaning this test can be used) for the duration of the COVID-19 declaration under Section 564(b)(1) of the Act, 21 U.S.C. section 360bbb-3(b)(1), unless the authorization is terminated or revoked sooner.  Performed at [facility], [address] [place], [place] [address]      Radiology Studies: No results found.   Scheduled Meds: . aspirin EC  81 mg Oral Q breakfast  . atorvastatin  80 mg Oral q1800  . carvedilol  3.125 mg Oral BID  . donepezil  10 mg Oral QHS  . enoxaparin (LOVENOX) injection  40 mg Subcutaneous Q24H  . feeding supplement (ENSURE ENLIVE)  237 mL Oral BID BM  .  ferrous sulfate  325 mg Oral BID  . nitroGLYCERIN  0.4 mg Transdermal Daily  . pantoprazole  40 mg Oral Daily  . QUEtiapine  50 mg Oral Daily  . vitamin B-12  1,000 mcg Oral Daily   Continuous Infusions:    LOS: 5 days     Time spent: 25 minutes    Barton Dubois, MD [facility]  If 7PM-7AM, please contact night-coverage [email or web address] 12/03/2019, 3:02 PM

## 2019-12-04 DIAGNOSIS — Z515 Encounter for palliative care: Secondary | ICD-10-CM

## 2019-12-04 DIAGNOSIS — Z7189 Other specified counseling: Secondary | ICD-10-CM

## 2019-12-04 NOTE — Progress Notes (Signed)
PROGRESS NOTE    Edward Trevino  FIE:[id number] DOB: [birth year] DOA: 11/27/2019 PCP: Leeanne Rio, MD   Brief Narrative:  Per HPI: Edward Trevino is a 86 y.o. male with medical history significant for coronary artery disease, cardiomyopathy, atrial fibrillation, dementia, depression. Patient was brought to the ED with complaints of generalized weakness, and not been able to eat x 4 days, also reports that over the past 3 days patient has not been able to ambulate.  Per intake notes patient also had diarrhea x 2 weeks. Patient also refusing to take his medications.  Patient lives alone, but has a paid caregiver. At the time of my evaluation patient is awake alert oriented, is able to answer simple questions appropriately.  He tells me he just did not feel like eating anything.  He endorses pain with it.  He denies pain.  He is not short of breath has no cough has no pain with urination.  He denies vomiting, he denies loose stools to me.  ED Course: Stable vitals.  Sodium 144.  Otherwise unremarkable CBC CMP.  UA not suggestive of infection.  Normal lactic acid 1.8.  Portable chest x-ray without acute abnormality.  EKG shows PVCs.  500 mill bolus given in ED.  In the ED patient was unable to ambulate, but was able to stand for about 30 seconds with two person assist.  Hospitalist to admit for further evaluation and management.  Assessment & Plan:   Principal Problem:   Failure to thrive in adult Active Problems:   MDD (major depressive disorder), recurrent severe, without psychosis ([facility])   Chronic systolic CHF (congestive heart failure) (HCC)   PAFib/Atrial fibrillation   Dementia with behavioral disturbance (HCC)   Generalized weakness secondary to failure to thrive -Continue nutritional supplements with Ensure -Continue to encourage oral intake. -May need appetite stimulants and will order calorie count with dietitian evaluation -Appreciate PT assessment with recommendations for SNF  and will need placement  Dementia with depression -Seroquel and donepezil  Ischemic cardiomyopathy -Last 2D echocardiogram 12/2018 with EF 55-60% with impaired LV diastolic relaxation -Continue aspirin, statins, carvedilol -No chest pain or shortness of breath.  Paroxysmal atrial fibrillation -High risk for fall no using anticoagulation -Continue the use of aspirin -Good rate control with the use of Coreg.  DNR -Long discussion with son at bedside; decision has been made to allow initiating course if patient experience any significant respiratory distress or his heart stopped. -DNR/DNI.  Macrocytosis  -started on B12 supplementation.   DVT prophylaxis: Lovenox Code Status: DNR Family Communication: Discussed with son at bedside. Disposition Plan: remains inpatient.   Dispo: The patient is from: Home              Anticipated d/c is to: SNF              Anticipated d/c date is: when PASSAR number acquired.              Patient currently medically stable for discharge to skilled nursing facility once PASSAR number acquired; patient remains high risk for falls due to underlying physical deconditioning.  Still with decreased appetite, but with some improvement in oral intake.  Recommendations given by physical therapy as to pursuit rehabilitation in a skilled nursing facility prior to go home.  Family in agreement.  Consultants:   None  Procedures:   See below  Antimicrobials:   None   Subjective: No fever, no chest pain, no nausea, no vomiting.  Overnight has  lost IV access.  No acute distress appreciated.  Objective: Vitals:   12/03/19 0911 12/03/19 0916 12/03/19 2055 12/04/19 0401  BP:  115/82 104/80 114/67  Pulse:  91 87 81  Resp:  20 20 20   Temp:   99 F (37.2 C) 98.6 F (37 C)  TempSrc:   Oral Oral  SpO2: 98% 100% 99% 98%  Weight:      Height:        Intake/Output Summary (Last 24 hours) at 12/04/2019 1306 Last data filed at 12/04/2019 0800 Gross per  24 hour  Intake 240 ml  Output --  Net 240 ml   Filed Weights   11/27/19 1052 11/28/19 0119  Weight: 73.5 kg 68 kg    Examination: General exam: Alert, awake, oriented x 2; in no acute distress currently.  Denies chest pain, no nausea, no vomiting.  Remains weak and deconditioned.  Patient lost IV access overnight; otherwise no overnight events. Respiratory system: Clear to auscultation. Respiratory effort normal. Cardiovascular system:RRR. No murmurs, rubs, gallops. Gastrointestinal system: Abdomen is nondistended, soft and nontender. No organomegaly or masses felt. Normal bowel sounds heard. Central nervous system: Alert and oriented. No focal neurological deficits. Extremities: No cyanosis or clubbing.  No edema appreciated on exam. Skin: No r petechiae. Psychiatry: Depressed mood and flat affect appreciated on examination; no hallucinations, no suicidal ideation.  Properly following commands, has been answering questions appropriately and is participating with staff.   Data Reviewed: I have personally reviewed following labs and imaging studies  CBC: Recent Labs  Lab 11/29/19 0738  WBC 7.7  HGB 13.2  HCT 40.8  MCV 101.0*  PLT 373   Basic Metabolic Panel: Recent Labs  Lab 11/29/19 0738  NA 145  K 3.9  CL 105  CO2 29  GLUCOSE 107*  BUN 20  CREATININE 1.07  CALCIUM 9.3   GFR: Estimated Creatinine Clearance: 48.5 mL/min (by C-G formula based on SCr of 1.07 mg/dL).   Liver Function Tests: No results for input(s): AST, ALT, ALKPHOS, BILITOT, PROT, ALBUMIN in the last 168 hours. CBG: Recent Labs  Lab 11/28/19 0750 11/28/19 1135  GLUCAP 104* 90   Sepsis Labs: No results for input(s): PROCALCITON, LATICACIDVEN in the last 168 hours.  Recent Results (from the past 240 hour(s))  SARS Coronavirus 2 by RT PCR (hospital order, performed in [facility] hospital lab) Nasopharyngeal Nasopharyngeal Swab     Status: None   Collection Time: 11/27/19  5:49 PM    Specimen: Nasopharyngeal Swab  Result Value Ref Range Status   SARS Coronavirus 2 NEGATIVE NEGATIVE Final    Comment: (NOTE) SARS-CoV-2 target nucleic acids are NOT DETECTED.  The SARS-CoV-2 RNA is generally detectable in upper and lower respiratory specimens during the acute phase of infection. The lowest concentration of SARS-CoV-2 viral copies this assay can detect is 250 copies / mL. A negative result does not preclude SARS-CoV-2 infection and should not be used as the sole basis for treatment or other patient management decisions.  A negative result may occur with improper specimen collection / handling, submission of specimen other than nasopharyngeal swab, presence of viral mutation(s) within the areas targeted by this assay, and inadequate number of viral copies (<250 copies / mL). A negative result must be combined with clinical observations, patient history, and epidemiological information.  Fact Sheet for Patients:   StrictlyIdeas.no  Fact Sheet for Healthcare Providers: BankingDealers.co.za  This test is not yet approved or  cleared by the [place] FDA and has  been authorized for detection and/or diagnosis of SARS-CoV-2 by FDA under an Emergency Use Authorization (EUA).  This EUA will remain in effect (meaning this test can be used) for the duration of the COVID-19 declaration under Section 564(b)(1) of the Act, 21 U.S.C. section 360bbb-3(b)(1), unless the authorization is terminated or revoked sooner.  Performed at [facility], [address], [place], [place] [address]      Radiology Studies: No results found.   Scheduled Meds: . aspirin EC  81 mg Oral Q breakfast  . atorvastatin  80 mg Oral q1800  . carvedilol  3.125 mg Oral BID  . donepezil  10 mg Oral QHS  . enoxaparin (LOVENOX) injection  40 mg Subcutaneous Q24H  . feeding supplement (ENSURE ENLIVE)  237 mL Oral BID BM  . ferrous sulfate  325 mg Oral  BID  . nitroGLYCERIN  0.4 mg Transdermal Daily  . pantoprazole  40 mg Oral Daily  . QUEtiapine  50 mg Oral Daily  . vitamin B-12  1,000 mcg Oral Daily   Continuous Infusions:    LOS: 6 days     Time spent: 25 minutes    Barton Dubois, MD [facility]  If 7PM-7AM, please contact night-coverage [email or web address] 12/04/2019, 1:06 PM
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
## 2019-12-04 NOTE — Progress Notes (Signed)
Palliative-   Consult received, chart reviewed.   Visited patient- he was in bed with blanket over his head. He would not engage with me.   Attempted to call all listed contacts. Only able to leave a message for Edward Trevino who is patient's paid caregiver- left message requesting assistance to contact patient's son- Edward Trevino who is patient's legal surrogate decision maker.   Edward Trevino, Edward Trevino Palliative Medicine  Please call Palliative Medicine team phone with any questions [phone]. For individual providers please see AMION.  No charge note

## 2019-12-04 NOTE — Care Management Important Message (Signed)
Important Message  Patient Details  Name: Edward Trevino MRN: [id number] Date of Birth: [birth year]   Medicare Important Message Given:  Yes     Tommy Medal 12/04/2019, 2:07 PM

## 2019-12-05 DIAGNOSIS — E785 Hyperlipidemia, unspecified: Secondary | ICD-10-CM

## 2019-12-05 DIAGNOSIS — R531 Weakness: Secondary | ICD-10-CM

## 2019-12-05 DIAGNOSIS — Z66 Do not resuscitate: Secondary | ICD-10-CM

## 2019-12-05 DIAGNOSIS — I10 Essential (primary) hypertension: Secondary | ICD-10-CM

## 2019-12-05 LAB — SARS CORONAVIRUS 2 BY RT PCR (HOSPITAL ORDER, PERFORMED IN ~~LOC~~ HOSPITAL LAB): SARS Coronavirus 2: NEGATIVE

## 2019-12-05 MED ORDER — CYANOCOBALAMIN 1000 MCG PO TABS: 1000.0000 ug | ORAL_TABLET | Freq: Every day | ORAL | Status: DC

## 2019-12-05 MED ORDER — ENSURE ENLIVE PO LIQD: 237.0000 mL | Freq: Two times a day (BID) | ORAL | Status: AC

## 2019-12-05 MED ORDER — NITROGLYCERIN 0.4 MG SL SUBL
0.4000 mg | SUBLINGUAL_TABLET | SUBLINGUAL | Status: AC | PRN
Start: 2019-12-05 — End: 2020-12-04

## 2019-12-05 MED ORDER — QUETIAPINE FUMARATE 50 MG PO TABS: 50.0000 mg | ORAL_TABLET | Freq: Every day | ORAL | Status: DC

## 2019-12-05 NOTE — Progress Notes (Signed)
Report called to [place] at [facility] and all questions answered. Patient taken to [facility] via [facility].

## 2019-12-05 NOTE — Progress Notes (Signed)
PROGRESS NOTE    Edward Trevino  FYB:[id number] DOB: 1932/02/04 DOA: 11/27/2019 PCP: Leeanne Rio, MD   Brief Narrative:  Per HPI: Edward Trevino is a 86 y.o. male with medical history significant for coronary artery disease, cardiomyopathy, atrial fibrillation, dementia, depression. Patient was brought to the ED with complaints of generalized weakness, and not been able to eat x 4 days, also reports that over the past 3 days patient has not been able to ambulate.  Per intake notes patient also had diarrhea x 2 weeks. Patient also refusing to take his medications.  Patient lives alone, but has a paid caregiver. At the time of my evaluation patient is awake alert oriented, is able to answer simple questions appropriately.  He tells me he just did not feel like eating anything.  He endorses pain with it.  He denies pain.  He is not short of breath has no cough has no pain with urination.  He denies vomiting, he denies loose stools to me.  ED Course: Stable vitals.  Sodium 144.  Otherwise unremarkable CBC CMP.  UA not suggestive of infection.  Normal lactic acid 1.8.  Portable chest x-ray without acute abnormality.  EKG shows PVCs.  500 mill bolus given in ED.  In the ED patient was unable to ambulate, but was able to stand for about 30 seconds with two person assist.  Hospitalist to admit for further evaluation and management.  Assessment & Plan:   Principal Problem:   Failure to thrive in adult Active Problems:   MDD (major depressive disorder), recurrent severe, without psychosis ([place])   Chronic systolic CHF (congestive heart failure) ([facility])   PAFib/Atrial fibrillation   Dementia with behavioral disturbance ([facility])   Goals of care, counseling/discussion   Advanced care planning/counseling discussion   Palliative care by specialist   Generalized weakness secondary to failure to thrive -Continue nutritional supplements with Ensure -Continue to encourage oral intake. -May need appetite  stimulants and will order calorie count with dietitian evaluation -Appreciate PT assessment with recommendations for SNF and will need placement  Dementia with depression -Seroquel and donepezil  Ischemic cardiomyopathy -Last 2D echocardiogram 12/2018 with EF 55-60% with impaired LV diastolic relaxation -Continue aspirin, statins, carvedilol -No chest pain or shortness of breath.  Paroxysmal atrial fibrillation -High risk for fall no using anticoagulation -Continue the use of aspirin -Good rate control with the use of Coreg.  DNR -Long discussion with son at bedside; decision has been made to allow initiating course if patient experience any significant respiratory distress or his heart stopped. -DNR/DNI.  Macrocytosis  -started on B12 supplementation.   DVT prophylaxis: Lovenox Code Status: DNR Family Communication: No family at bedside today. Disposition Plan: remains inpatient.   Dispo: The patient is from: Home              Anticipated d/c is to: SNF              Anticipated d/c date is: when PASSAR number acquired.              Patient currently medically stable for discharge to skilled nursing facility once PASSAR number acquired; patient remains high risk for falls due to underlying physical deconditioning.  Still with decreased appetite, but with some improvement in oral intake.  Recommendations given by physical therapy as to pursuit rehabilitation in a skilled nursing facility prior to go home.  Family in agreement.  Consultants:   Palliative care  Procedures:   See below  Antimicrobials:   None   Subjective: No fever, no chest pain, no nausea, no vomiting.  No overnight events.  Objective: Vitals:   12/04/19 1423 12/04/19 2056 12/04/19 2106 12/05/19 0637  BP: (!) 89/53  (!) 98/58 126/82  Pulse: 85  62 83  Resp: 18  20 14   Temp: 99.4 F (37.4 C)  98.7 F (37.1 C) 98.3 F (36.8 C)  TempSrc:    Oral  SpO2: 99% 99% 98% 100%  Weight:      Height:        No intake or output data in the 24 hours ending 12/05/19 0822 Filed Weights   11/27/19 1052 11/28/19 0119  Weight: 73.5 kg 68 kg    Examination: General exam: Alert, awake, oriented x 2 (person and place); no chest pain, no nausea, no vomiting.  Chronically ill in appearance.  Weak and deconditioned. Respiratory system: Clear to auscultation. Respiratory effort normal. Cardiovascular system:RRR. No murmurs, rubs, gallops.  No JVD. Gastrointestinal system: Abdomen is nondistended, soft and nontender. No organomegaly or masses felt. Normal bowel sounds heard. Central nervous system: Alert and oriented. No focal neurological deficits. Extremities: No cyanosis or clubbing. Skin: No rashes, no petechiae. Psychiatry: Flat affect appreciated on examination.  No suicidal ideation or hallucination. Nursing staff reporting fluctuating mood (tipically for his underlying dementia).   Data Reviewed: I have personally reviewed following labs and imaging studies  CBC: Recent Labs  Lab 11/29/19 0738  WBC 7.7  HGB 13.2  HCT 40.8  MCV 101.0*  PLT 941   Basic Metabolic Panel: Recent Labs  Lab 11/29/19 0738  NA 145  K 3.9  CL 105  CO2 29  GLUCOSE 107*  BUN 20  CREATININE 1.07  CALCIUM 9.3   GFR: Estimated Creatinine Clearance: 48.5 mL/min (by C-G formula based on SCr of 1.07 mg/dL).   CBG: Recent Labs  Lab 11/28/19 1135  GLUCAP 90   Sepsis Labs: No results for input(s): PROCALCITON, LATICACIDVEN in the last 168 hours.  Recent Results (from the past 240 hour(s))  SARS Coronavirus 2 by RT PCR (hospital order, performed in [facility] hospital lab) Nasopharyngeal Nasopharyngeal Swab     Status: None   Collection Time: 11/27/19  5:49 PM   Specimen: Nasopharyngeal Swab  Result Value Ref Range Status   SARS Coronavirus 2 NEGATIVE NEGATIVE Final    Comment: (NOTE) SARS-CoV-2 target nucleic acids are NOT DETECTED.  The SARS-CoV-2 RNA is generally detectable in upper and  lower respiratory specimens during the acute phase of infection. The lowest concentration of SARS-CoV-2 viral copies this assay can detect is 250 copies / mL. A negative result does not preclude SARS-CoV-2 infection and should not be used as the sole basis for treatment or other patient management decisions.  A negative result may occur with improper specimen collection / handling, submission of specimen other than nasopharyngeal swab, presence of viral mutation(s) within the areas targeted by this assay, and inadequate number of viral copies (<250 copies / mL). A negative result must be combined with clinical observations, patient history, and epidemiological information.  Fact Sheet for Patients:   StrictlyIdeas.no  Fact Sheet for Healthcare Providers: BankingDealers.co.za  This test is not yet approved or  cleared by the [place] FDA and has been authorized for detection and/or diagnosis of SARS-CoV-2 by FDA under an Emergency Use Authorization (EUA).  This EUA will remain in effect (meaning this test can be used) for the duration of the COVID-19 declaration under Section 564(b)(1) of the  Act, 21 U.S.C. section 360bbb-3(b)(1), unless the authorization is terminated or revoked sooner.  Performed at [facility], [address], [place], [place] [address]      Radiology Studies: No results found.   Scheduled Meds: . aspirin EC  81 mg Oral Q breakfast  . atorvastatin  80 mg Oral q1800  . carvedilol  3.125 mg Oral BID  . donepezil  10 mg Oral QHS  . enoxaparin (LOVENOX) injection  40 mg Subcutaneous Q24H  . feeding supplement (ENSURE ENLIVE)  237 mL Oral BID BM  . ferrous sulfate  325 mg Oral BID  . nitroGLYCERIN  0.4 mg Transdermal Daily  . pantoprazole  40 mg Oral Daily  . QUEtiapine  50 mg Oral Daily  . vitamin B-12  1,000 mcg Oral Daily   Continuous Infusions:    LOS: 7 days     Time spent: 25  minutes    Barton Dubois, MD [facility]  If 7PM-7AM, please contact night-coverage [email or web address] 12/05/2019, 8:22 AM

## 2019-12-05 NOTE — TOC Transition Note (Addendum)
Transition of Care [facility]) - CM/SW Discharge Note   Patient Details  Name: Edward Trevino MRN: [id number] Date of Birth: December 22, 1931  Transition of Care [facility]) CM/SW Contact:  Boneta Lucks, RN Phone Number: 12/05/2019, 8:33 AM   Clinical Narrative:   PASSR received [id number] F, Confirm with Jackelyn Poling, they will be ready for patient today, with a rapid COVID test. RN and MD updated.  Clinical sent in Hub, Medical necessity printed, Updated [name] with discharge plan.     Final next level of care: Skilled Nursing Facility Barriers to Discharge: Barriers Resolved   Patient Goals and CMS Choice Patient states their goals for this hospitalization and ongoing recovery are:: To go to SNF CMS Medicare.gov Compare Post Acute Care list provided to:: Patient Represenative (must comment) Choice offered to / list presented to :  [name])  Discharge Placement              Patient chooses bed at: Other - please specify in the comment section below: ([facility]) Patient to be transferred to facility by: EMS Name of family member notified: [name] Patient and family notified of of transfer: 12/05/19  Discharge Plan and [place]

## 2019-12-05 NOTE — Discharge Summary (Signed)
Physician Discharge Summary  Edward Trevino DXI:[id number] DOB: 05/07/1981 DOA: 11/27/2019  PCP: Leeanne Rio, MD  Admit date: 11/27/2019 Discharge date: 12/05/2019  Time spent: 35 minutes  Recommendations for Outpatient Follow-up:  -Physical therapy and rehabilitation as per the skilled nursing facility protocol. -Palliative care to follow patient after discharge. -Soft diet recommended (patient without dentures and overall bad teeth). -Ensure adequate hydration and encourage oral intake. -Take medications as prescribed.   Discharge Diagnoses:  Principal Problem:   Failure to thrive in adult Active Problems:   MDD (major depressive disorder), recurrent severe, without psychosis ([place])   Chronic systolic CHF (congestive heart failure) ([place])   Hyperlipidemia   PAFib/Atrial fibrillation   Dementia with behavioral disturbance (HCC)   Goals of care, counseling/discussion   Advanced care planning/counseling discussion   Palliative care by specialist   Weakness   DNR (do not resuscitate)   Essential hypertension   Discharge Condition: Stable and improved.  Discharged to skilled nursing facility for further care and rehabilitation.  CODE STATUS: DNR/DNI.  Diet recommendation: Soft diet (heart healthy).  Filed Weights   11/27/19 1052 11/28/19 0119  Weight: 73.5 kg 68 kg    History of present illness:  Per HPI: Edward Trevino an 86 y.o.malewith medical history significant forcoronary artery disease, cardiomyopathy, atrial fibrillation, dementia, depression. Patient was brought to the ED with complaints of generalized weakness, and not been able to eatx 4 days,also reports that over the past 3 days patient has not been able to ambulate.Per intake notes patient also had diarrheax 2 weeks. Patient also refusing to take his medications. Patient lives alone, but has a paid caregiver. At the time of my evaluation patient is awake alert oriented, is able to answer simple  questions appropriately. He tells me he just did not feel like eating anything. He endorses pain with it. He denies pain. He is not short of breath has no cough has no pain with urination. He denies vomiting, he denies loose stools to me.  ED Course:Stable vitals. Sodium 144. Otherwise unremarkable CBC CMP.UA not suggestive of infection. Normal lactic acid 1.8. Portable chest x-ray without acute abnormality. EKG shows PVCs.500 mill bolus given in ED. In the ED patient was unable to ambulate, but was able to stand for about 30 seconds with two person assist.Hospitalist to admit for further evaluation and management.  Hospital Course:  Generalized weakness secondary to failure to thrive -Continue nutritional supplements with Ensure -Continue to encourage oral intake and to maintain adequate hydration. -Soft diet to facilitate chewing. -Appreciate PT assessment with recommendations for SNF and will be discharge to [facility] a skilled nursing facility for further care and rehabilitation.  Dementia with depression -Continue adjusted dose of Seroquel and donepezil. -Constant reorientation and supportive care.  Ischemic cardiomyopathy -Last 2D echocardiogram 12/2018 with EF 55-60% with impaired LV diastolic relaxation -Continue aspirin, statins and carvedilol -No chest pain or shortness of breath currently. -Continue heart healthy diet.  History of paroxysmal atrial fibrillation -High risk for falls and not on anticoagulation. -Continue the use of aspirin. -Good rate control with the use of Coreg.  DNR -Long discussion with son at bedside; decision has been made to allow initiating course if patient experience any significant respiratory distress or his heart stopped. -DNR/DNI decided. -Will recommend outpatient follow-up by palliative care service.  Macrocytosis  -started on B12 supplementation.  Procedures: See below for x-ray  report.  Consultations:  Palliative care  Discharge Exam: Vitals:   12/04/19 2106 12/05/19 7673  BP: (!) 98/58 126/82  Pulse: 62 83  Resp: 20 14  Temp: 98.7 F (37.1 C) 98.3 F (36.8 C)  SpO2: 98% 100%   General exam: Alert, awake, oriented x 2 (person and place); no chest pain, no nausea, no vomiting.  Chronically ill in appearance.  Weak and deconditioned. Respiratory system: Clear to auscultation. Respiratory effort normal. Cardiovascular system:RRR. No murmurs, rubs, gallops.  No JVD. Gastrointestinal system: Abdomen is nondistended, soft and nontender. No organomegaly or masses felt. Normal bowel sounds heard. Central nervous system: Alert and oriented. No focal neurological deficits. Extremities: No cyanosis or clubbing. Skin: No rashes, no petechiae. Psychiatry: Flat affect appreciated on examination.  No suicidal ideation or hallucination. Nursing staff reporting fluctuating mood (tipically for his underlying dementia).   Discharge Instructions   Discharge Instructions    Diet - low sodium heart healthy   Complete by: As directed    Discharge instructions   Complete by: As directed    -Physical therapy and rehabilitation as per the skilled nursing facility protocol. -Palliative care to follow patient after discharge. -Soft diet recommended (patient without dentures and overall bad teeth). -Ensure adequate hydration and encourage oral intake. -Take medications as prescribed.     Allergies as of 12/05/2019   No Known Allergies     Medication List    STOP taking these medications   nitroGLYCERIN 0.4 mg/hr patch Commonly known as: NITRODUR - Dosed in mg/24 hr Replaced by: nitroGLYCERIN 0.4 MG SL tablet     TAKE these medications   aspirin EC 81 MG tablet Take 1 tablet (81 mg total) by mouth daily with breakfast.   atorvastatin 80 MG tablet Commonly known as: LIPITOR Take 80 mg by mouth daily at 6 PM.   carvedilol 3.125 MG tablet Commonly known as:  COREG Take 1 tablet (3.125 mg total) by mouth 2 (two) times daily with a meal.   cyanocobalamin 1000 MCG tablet Take 1 tablet (1,000 mcg total) by mouth daily.   donepezil 10 MG tablet Commonly known as: ARICEPT Take 1 tablet (10 mg total) by mouth at bedtime.   feeding supplement (ENSURE ENLIVE) Liqd Take 237 mLs by mouth 2 (two) times daily between meals.   ferrous sulfate 325 (65 FE) MG EC tablet Take 1 tablet (325 mg total) by mouth 2 (two) times daily.   nitroGLYCERIN 0.4 MG SL tablet Commonly known as: Nitrostat Place 1 tablet (0.4 mg total) under the tongue every 5 (five) minutes as needed for chest pain. Replaces: nitroGLYCERIN 0.4 mg/hr patch   pantoprazole 40 MG tablet Commonly known as: Protonix Take 1 tablet (40 mg total) by mouth daily.   QUEtiapine 50 MG tablet Commonly known as: SEROquel Take 1 tablet (50 mg total) by mouth daily. What changed:   medication strength  when to take this   Vitamin D (Ergocalciferol) 1.25 MG (50000 UNIT) Caps capsule Commonly known as: DRISDOL Take 1 capsule (50,000 Units total) by mouth every Sunday.      No Known Allergies  Contact information for after-discharge care    [facility] .   Service: Skilled Nursing Contact information: [address] [place] [phone]                   The results of significant diagnostics from this hospitalization (including imaging, microbiology, ancillary and laboratory) are listed below for reference.    Significant Diagnostic Studies: DG Chest Port 1 View  Result Date:  11/27/2019 CLINICAL DATA:  Weakness, loss of appetite EXAM: PORTABLE CHEST 1 VIEW COMPARISON:  03/28/2019 FINDINGS: There is hyperinflation of the lungs compatible with COPD. Biapical scarring. Heart is normal size. No acute confluent airspace opacities or effusions. No acute bony abnormality. IMPRESSION: COPD/chronic changes.  No  active disease. Electronically Signed   By: Rolm Baptise M.D.   On: 11/27/2019 12:47   DG Finger Ring Right  Result Date: 11/22/2019 Right finger/hand x-rays for PIP dislocation of the ring finger X-rays show congruent joint reduction Impression stable PIP joint right ring finger   Microbiology: Recent Results (from the past 240 hour(s))  SARS Coronavirus 2 by RT PCR (hospital order, performed in [facility] hospital lab) Nasopharyngeal Nasopharyngeal Swab     Status: None   Collection Time: 11/27/19  5:49 PM   Specimen: Nasopharyngeal Swab  Result Value Ref Range Status   SARS Coronavirus 2 NEGATIVE NEGATIVE Final    Comment: (NOTE) SARS-CoV-2 target nucleic acids are NOT DETECTED.  The SARS-CoV-2 RNA is generally detectable in upper and lower respiratory specimens during the acute phase of infection. The lowest concentration of SARS-CoV-2 viral copies this assay can detect is 250 copies / mL. A negative result does not preclude SARS-CoV-2 infection and should not be used as the sole basis for treatment or other patient management decisions.  A negative result may occur with improper specimen collection / handling, submission of specimen other than nasopharyngeal swab, presence of viral mutation(s) within the areas targeted by this assay, and inadequate number of viral copies (<250 copies / mL). A negative result must be combined with clinical observations, patient history, and epidemiological information.  Fact Sheet for Patients:   StrictlyIdeas.no  Fact Sheet for Healthcare Providers: BankingDealers.co.za  This test is not yet approved or  cleared by the [place] FDA and has been authorized for detection and/or diagnosis of SARS-CoV-2 by FDA under an Emergency Use Authorization (EUA).  This EUA will remain in effect (meaning this test can be used) for the duration of the COVID-19 declaration under Section 564(b)(1) of the  Act, 21 U.S.C. section 360bbb-3(b)(1), unless the authorization is terminated or revoked sooner.  Performed at [facility], [address], [place] [employer], [place] [address]      Labs: Basic Metabolic Panel: Recent Labs  Lab 11/29/19 0738  NA 145  K 3.9  CL 105  CO2 29  GLUCOSE 107*  BUN 20  CREATININE 1.07  CALCIUM 9.3   CBC: Recent Labs  Lab 11/29/19 0738  WBC 7.7  HGB 13.2  HCT 40.8  MCV 101.0*  PLT 229    CBG: Recent Labs  Lab 11/28/19 1135  GLUCAP 90    Signed:  Barton Dubois MD.  [facility] 12/05/2019, 8:52 AM

## 2020-05-26 NOTE — Progress Notes (Deleted)
PATIENT: Edward Trevino DOB: 11/11/1931  REASON FOR VISIT: follow up HISTORY FROM: patient  HISTORY OF PRESENT ILLNESS: Today 05/26/20  HISTORY  Edward Trevino is a 87 year old male, seen in request by his primary care physician Dr. Huel Cote, [name] for evaluation of dementia, he is accompanied by his caregiver [name] at today's visit on November 21, 2019, [name] has taking care of him for 6 years now, since 2015, she sees him 6 hours each day, 5 days each week.  I reviewed and summarized the referring note. He had a past medical history of hypertension, Hyperlipidemia Coronary artery disease Suicidal attempts per record  He is retired Administrator, has 5 sons, is power of attorney, one of his son [name] lives on the same property as he was, but per his caregiver [name], was not really involved in patient care because of his personal issues.  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  But he is no longer able to take care of his dog, cannot fix meal for himself, increased gait abnormality, sometimes bowel and bladder incontinence.  Patient did not provide any history, but cursed occasionally during conversation, apparently has visual hallucinations,  [name] does report patient has gradual worsening agitations, his family supposed to administer medication at nighttime, including Seroquel 200 mg every night, but sometimes the pill still left on his table, [name] also described that in the morning time, sometimes he become agitated,      I personally reviewed CT head without contrast in June 2021, no acute intracranial  abnormality, soft tissue contusion on the left forehead  Update May 27, 2020 SS: Hospitalized in July for generalized weakness secondary to failure to thrive, discharged SNF  REVIEW OF SYSTEMS: Out of a complete 14 system review of symptoms, the patient complains only of the following symptoms, and all other reviewed systems are negative.  ALLERGIES: No Known Allergies  HOME MEDICATIONS: Outpatient Medications Prior to Visit  Medication Sig Dispense Refill  . aspirin EC 81 MG tablet Take 1 tablet (81 mg total) by mouth daily with breakfast. 30 tablet 2  . atorvastatin (LIPITOR) 80 MG tablet Take 80 mg by mouth daily at 6 PM.    . carvedilol (COREG) 3.125 MG tablet Take 1 tablet (3.125 mg total) by mouth 2 (two) times daily with a meal. 60 tablet 3  . donepezil (ARICEPT) 10 MG tablet Take 1 tablet (10 mg total) by mouth at bedtime. 30 tablet 11  . feeding supplement, ENSURE ENLIVE, (ENSURE ENLIVE) LIQD Take 237 mLs by mouth 2 (two) times daily between meals.    . ferrous sulfate 325 (65 FE) MG EC tablet Take 1 tablet (325 mg total) by mouth 2 (two) times daily. 60 tablet 3  . nitroGLYCERIN (NITROSTAT) 0.4 MG SL tablet Place 1 tablet (0.4 mg total) under the tongue every 5 (five) minutes as needed for chest pain.    . pantoprazole (PROTONIX) 40 MG tablet Take 1 tablet (40 mg total) by mouth daily. 30 tablet 2  . QUEtiapine (SEROQUEL) 50 MG tablet Take 1  tablet (50 mg total) by mouth daily.    . vitamin B-12 1000 MCG tablet Take 1 tablet (1,000 mcg total) by mouth daily.    . Vitamin D, Ergocalciferol, (DRISDOL) 1.25 MG (50000 UT) CAPS capsule Take 1 capsule (50,000 Units total) by mouth every Sunday. 5 capsule 2   No facility-administered medications prior to visit.    PAST MEDICAL HISTORY: Past Medical History:  Diagnosis Date  . Anxiety   . Bladder stones 05/22/2015  . Choledocholithiasis with obstruction 07/25/2012  . Dementia ([place])   . Depression   . Duodenitis 05/11/15  .  Erosive esophagitis   . Esophageal stricture 05/11/15   Dilated  . Gastric out let obstruction   . Gastric outlet obstruction 05/07/2015  . HOH (hard of hearing)   . Insomnia   . Ischemic cardiomyopathy    LVEF 20%  . Multiple duodenal ulcers 05/11/15   2  were present per EGD.  . ST elevation myocardial infarction (STEMI) of anterior wall [facility])    October 2016 - late presentation, managed conservatively  . Suicide attempt [facility])     PAST SURGICAL HISTORY: Past Surgical History:  Procedure Laterality Date  . APPENDECTOMY    . BACK SURGERY    . BALLOON DILATION N/A 07/24/2012   Procedure: BALLOON DILATION;  Surgeon: Daneil Dolin, MD;  Location: [facility];  Service: Endoscopy;  Laterality: N/A;  Balloon Stone Extraction, Drudging  . BIOPSY N/A 07/24/2012   Procedure: BIOPSY;  Surgeon: Daneil Dolin, MD;  Location: [facility];  Service: Endoscopy;  Laterality: N/A;  Duodenal and Esophageal Biopsies  . BIOPSY N/A 10/18/2012   Procedure: BIOPSY;  Surgeon: Daneil Dolin, MD;  Location: [facility];  Service: Endoscopy;  Laterality: N/A;  . CHOLECYSTECTOMY N/A 10/26/2012   Procedure: LAPAROSCOPIC CHOLECYSTECTOMY;  Surgeon: Jamesetta So, MD;  Location: [facility]S;  Service: General;  Laterality: N/A;  . EGD/ERCP  10/18/2012   Dr. Gala Romney: ;yloric channel stenosis, s/p dilation and bx, persisting CBD stones with extension of sphincterotomy and sphincterotomy balloon dilation and stone extraction. Removal of biliary stent  . ERCP N/A 07/24/2012   Dr. Gala Romney. Significant abnormalities of the bowl and proximal second portion producing partial gastric outlet stricture and with secondary gastric dilation and severe erosive reflux esophagitis. Biopsy showed benign ulceration. Normal-appearing ampulla, status post biliary sphincterotomy and ampulla balloon dilation, stone extraction and stent placement.  Marland Kitchen ERCP N/A 10/18/2012   Procedure: ENDOSCOPIC RETROGRADE CHOLANGIOPANCREATOGRAPHY (ERCP);  Surgeon: Daneil Dolin, MD;   Location: [facility];  Service: Endoscopy;  Laterality: N/A;  . ESOPHAGEAL DILATION N/A 05/15/2015   Procedure: ESOPHAGEAL DILATION;  Surgeon: Danie Binder, MD;  Location: [facility] END[facility] SU[facility];  Service: Endoscopy;  Laterality: N/A;  . ESOPHAGOGASTRODUODENOSCOPY N/A 07/24/2012   [id number] ampulla s/p biliary sphincterotomy and ampullary  . ESOPHAGOGASTRODUODENOSCOPY N/A 05/07/2015   RMR: Severe esophagitis, retained food in the stomach precluded complete exam, pyloric channel/duodenal bulb abnormal with 1 cm ulcer, friability with high-grade gastric outlet obstruction. No dilation performed on Plavix and aspirin.  . ESOPHAGOGASTRODUODENOSCOPY N/A 05/11/2015   Rehman: Distal esophageal ulcers with circumferential ulceration at distal 3-4 cm, soft stricture dilated with scope, diffuse erythema and edema in the bulbar mucosa with 2 small ulcers in the distal segment and high-grade stricture in the middle, scope could not be passed. Dilated up to 12 mm. Could not see duodenum beyond this point because the scope was retained in the large stomach.  . ESOPHAGOGASTRODUODENOSCOPY N/A 05/15/2015   SLF:  Esophagitis with stricture, dilated GE junction stricture up to 14 mm with the balloon, D1/D2 stricture with narrowing of the lumen to 10-11 mm, dilated up to 15 mm with balloon. Downstream duodenum appear normal. Small duodenal bulbar ulcer. Biopsies from the stomach benign gastritis, no H pylori.  . ESOPHAGOGASTRODUODENOSCOPY (EGD) WITH PROPOFOL N/A 10/18/2012   [id number] channel stenosis s/p dilation/Persisting common bile duct stones with extension of sphincterotomy     and sphincterotomy balloon dilation and stone extraction.  Biliary stent removal   . LAPAROSCOPY N/A 10/31/2012   Procedure: LAPAROSCOPY DIAGNOSTIC;  Surgeon: Donato Heinz, MD;  Location: [facility];  Service: General;  Laterality: N/A;  . REMOVAL OF STONES N/A 07/24/2012   Procedure: REMOVAL OF STONES;  Surgeon: Daneil Dolin, MD;  Location:  [facility];  Service: Endoscopy;  Laterality: N/A;  . REMOVAL OF STONES N/A 10/18/2012   Procedure: REMOVAL OF STONES;  Surgeon: Daneil Dolin, MD;  Location: [facility];  Service: Endoscopy;  Laterality: N/A;  . SPHINCTEROTOMY N/A 07/24/2012   Procedure: SPHINCTEROTOMY;  Surgeon: Daneil Dolin, MD;  Location: [facility];  Service: Endoscopy;  Laterality: N/A;    FAMILY HISTORY: Family History  Problem Relation Age of Onset  . Depression Sister   . Other Mother        unsure of history  . Other Father        never had relationship w/ father  . Colon cancer Neg Hx   . Heart disease Neg Hx     SOCIAL HISTORY: Social History   Socioeconomic History  . Marital status: Single    Spouse name: Not on file  . Number of children: 5  . Years of education: 22  . Highest education level: High school graduate  Occupational History  . Occupation: Retired  Tobacco Use  . Smoking status: Current Every Day Smoker    Types: Cigars  . Smokeless tobacco: Never Used  . Tobacco comment: Smokes 4-5 cigars a day "but I don't really inhale."  Vaping Use  . Vaping Use: Never used  Substance and Sexual Activity  . Alcohol use: No    Alcohol/week: 0.0 standard drinks  . Drug use: Never  . Sexual activity: Never  Other Topics Concern  . Not on file  Social History Narrative   He lives alone. He has caregiver from Monday-Friday 9-3. He has family that lives on the same property who look in on him at other times.   Right-handed.   Four cups coffee per day.   Social Determinants of Health   Financial Resource Strain: Not on file  Food Insecurity: Not on file  Transportation Needs: Not on file  Physical Activity: Not on file  Stress: Not on file  Social Connections: Not on file  Intimate Partner Violence: Not on file      PHYSICAL EXAM  There were no vitals filed for this visit. There is no height or weight on file to calculate BMI.  Generalized: Well developed, in no acute distress    Neurological examination  Mentation: Alert oriented to time, place, history taking. Follows all commands speech and language fluent Cranial nerve II-XII: Pupils were equal round reactive to light. Extraocular movements were full, visual field were full on confrontational test. Facial sensation and strength were normal. Uvula tongue midline. Head turning and shoulder shrug  were normal and symmetric. Motor: The motor testing reveals 5 over 5 strength of all 4 extremities. Good symmetric motor tone is noted throughout.  Sensory: Sensory testing is intact to soft touch on all 4 extremities. No evidence of extinction is noted.  Coordination: Cerebellar testing reveals good finger-nose-finger and heel-to-shin bilaterally.  Gait and station: Gait is normal. Tandem gait is normal. Romberg is negative. No drift is seen.  Reflexes: Deep tendon reflexes are symmetric and normal bilaterally.   DIAGNOSTIC DATA (LABS, IMAGING, TESTING) - I reviewed patient records, labs, notes, testing and imaging myself where available.  Lab Results  Component Value Date   WBC 7.7 11/29/2019   HGB 13.2 11/29/2019   HCT 40.8 11/29/2019   MCV 101.0 (H) 11/29/2019   PLT 229 11/29/2019      Component Value Date/Time   NA 145 11/29/2019 0738   NA 140 11/21/2019 1516   K 3.9 11/29/2019 0738   K 3.7 10/05/2012 0000   CL 105 11/29/2019 0738   CO2 29 11/29/2019 0738   GLUCOSE 107 (H) 11/29/2019 0738   BUN 20 11/29/2019 0738   BUN 14 11/21/2019 1516   CREATININE 1.07 11/29/2019 0738   CREATININE 1.28 (H) 09/10/2015 0926   CALCIUM 9.3 11/29/2019 0738   CALCIUM 8.7 10/05/2012 0000   PROT 7.8 11/27/2019 1238   PROT 6.8 11/21/2019 1516   ALBUMIN 4.1 11/27/2019 1238   ALBUMIN 4.0 11/21/2019 1516   AST 22 11/27/2019 1238   AST 23 10/05/2012 0000   ALT 16 11/27/2019 1238   ALKPHOS 157 (H) 11/27/2019 1238   ALKPHOS 97 10/05/2012 0000   BILITOT 0.9 11/27/2019 1238   BILITOT 0.2 11/21/2019 1516   BILITOT 0.3  10/05/2012 0000   GFRNONAA >60 11/29/2019 0738   GFRAA >60 11/29/2019 0738   Lab Results  Component Value Date   CHOL 74 02/20/2018   HDL 33 (L) 02/20/2018   LDLCALC 23 02/20/2018   TRIG 89 02/20/2018   CHOLHDL 2.2 02/20/2018   Lab Results  Component Value Date   HGBA1C  02/08/2010    5.6 (NOTE)                                                                       According to the ADA Clinical Practice Recommendations for 2011, when HbA1c is used as a screening test:   >=6.5%   Diagnostic of Diabetes Mellitus           (if abnormal result  is confirmed)  5.7-6.4%   Increased risk of developing Diabetes Mellitus  References:Diagnosis and Classification of Diabetes Mellitus,Diabetes [id number] 1):S62-S69 and Standards of Medical Care in         Diabetes - 2011,Diabetes Care,2011,34  (Suppl 1):S11-S61.   Lab Results  Component Value Date   VITAMINB12 279 11/21/2019   Lab Results  Component Value Date   TSH 4.870 (H) 11/21/2019      ASSESSMENT AND PLAN 87 y.o. year old male  has a past medical history of Anxiety, Bladder stones (05/22/2015), Choledocholithiasis with obstruction (07/25/2012), Dementia ([place]), Depression, Duodenitis (05/11/15), Erosive esophagitis, Esophageal stricture (05/11/15), Gastric out let obstruction, Gastric outlet obstruction (05/07/2015), HOH (hard of hearing), Insomnia, Ischemic cardiomyopathy, Multiple duodenal ulcers (05/11/15), ST elevation myocardial infarction (STEMI) of anterior wall ([place]), and Suicide attempt ([place]). here with:  1.  Dementia with agitation -MMSE has been 13/30 -Continue Seroquel 25 mg,  2 tablets twice a day -Continues Aricept 10 mg daily -TSH elevated 4.870; B12, RPR normal  2.  Gait abnormality -Previous PT didn't do well with agitation   I spent 15 minutes with the patient. 50% of this time was spent   Butler Denmark, [place], DNP 05/26/2020, 12:58 PM [facility] [address], [place], [place]  [address] [phone]

## 2020-05-27 ENCOUNTER — Ambulatory Visit: Payer: Self-pay | Admitting: Neurology

## 2020-05-27 ENCOUNTER — Encounter: Payer: Self-pay | Admitting: Neurology

## 2021-02-10 ENCOUNTER — Encounter (HOSPITAL_COMMUNITY): Payer: Self-pay

## 2021-02-10 ENCOUNTER — Inpatient Hospital Stay (HOSPITAL_COMMUNITY)
Admission: EM | Admit: 2021-02-10 | Discharge: 2021-02-17 | DRG: 640 | Disposition: A | Discharge status: Hospice | Payer: No Typology Code available for payment source | Attending: Family Medicine | Admitting: Family Medicine

## 2021-02-10 DIAGNOSIS — F419 Anxiety disorder, unspecified: Secondary | ICD-10-CM | POA: Diagnosis present

## 2021-02-10 DIAGNOSIS — R531 Weakness: Secondary | ICD-10-CM | POA: Diagnosis not present

## 2021-02-10 DIAGNOSIS — I480 Paroxysmal atrial fibrillation: Secondary | ICD-10-CM | POA: Diagnosis present

## 2021-02-10 DIAGNOSIS — I2510 Atherosclerotic heart disease of native coronary artery without angina pectoris: Secondary | ICD-10-CM | POA: Diagnosis present

## 2021-02-10 DIAGNOSIS — F0394 Unspecified dementia, unspecified severity, with anxiety: Secondary | ICD-10-CM | POA: Diagnosis present

## 2021-02-10 DIAGNOSIS — E43 Unspecified severe protein-calorie malnutrition: Secondary | ICD-10-CM | POA: Diagnosis present

## 2021-02-10 DIAGNOSIS — R627 Adult failure to thrive: Secondary | ICD-10-CM | POA: Diagnosis present

## 2021-02-10 DIAGNOSIS — F0391 Unspecified dementia with behavioral disturbance: Secondary | ICD-10-CM

## 2021-02-10 DIAGNOSIS — Z7982 Long term (current) use of aspirin: Secondary | ICD-10-CM | POA: Diagnosis not present

## 2021-02-10 DIAGNOSIS — Z66 Do not resuscitate: Secondary | ICD-10-CM | POA: Diagnosis present

## 2021-02-10 DIAGNOSIS — I493 Ventricular premature depolarization: Secondary | ICD-10-CM | POA: Diagnosis present

## 2021-02-10 DIAGNOSIS — F418 Other specified anxiety disorders: Secondary | ICD-10-CM | POA: Diagnosis not present

## 2021-02-10 DIAGNOSIS — Z9151 Personal history of suicidal behavior: Secondary | ICD-10-CM | POA: Diagnosis not present

## 2021-02-10 DIAGNOSIS — H9190 Unspecified hearing loss, unspecified ear: Secondary | ICD-10-CM | POA: Diagnosis present

## 2021-02-10 DIAGNOSIS — Z681 Body mass index (BMI) 19 or less, adult: Secondary | ICD-10-CM

## 2021-02-10 DIAGNOSIS — F0393 Unspecified dementia, unspecified severity, with mood disturbance: Secondary | ICD-10-CM | POA: Diagnosis present

## 2021-02-10 DIAGNOSIS — I4891 Unspecified atrial fibrillation: Secondary | ICD-10-CM | POA: Diagnosis not present

## 2021-02-10 DIAGNOSIS — Z818 Family history of other mental and behavioral disorders: Secondary | ICD-10-CM

## 2021-02-10 DIAGNOSIS — K2100 Gastro-esophageal reflux disease with esophagitis, without bleeding: Secondary | ICD-10-CM | POA: Diagnosis present

## 2021-02-10 DIAGNOSIS — I252 Old myocardial infarction: Secondary | ICD-10-CM

## 2021-02-10 DIAGNOSIS — Z20822 Contact with and (suspected) exposure to covid-19: Secondary | ICD-10-CM | POA: Diagnosis present

## 2021-02-10 DIAGNOSIS — I255 Ischemic cardiomyopathy: Secondary | ICD-10-CM | POA: Diagnosis present

## 2021-02-10 DIAGNOSIS — F17290 Nicotine dependence, other tobacco product, uncomplicated: Secondary | ICD-10-CM | POA: Diagnosis present

## 2021-02-10 DIAGNOSIS — Z79899 Other long term (current) drug therapy: Secondary | ICD-10-CM | POA: Diagnosis not present

## 2021-02-10 DIAGNOSIS — F03918 Unspecified dementia, unspecified severity, with other behavioral disturbance: Secondary | ICD-10-CM | POA: Diagnosis present

## 2021-02-10 DIAGNOSIS — Z515 Encounter for palliative care: Secondary | ICD-10-CM

## 2021-02-10 DIAGNOSIS — Z8679 Personal history of other diseases of the circulatory system: Secondary | ICD-10-CM | POA: Diagnosis not present

## 2021-02-10 LAB — CBC WITH DIFFERENTIAL/PLATELET
Abs Immature Granulocytes: 0.09 10*3/uL — ABNORMAL HIGH (ref 0.00–0.07)
Basophils Absolute: 0.1 10*3/uL (ref 0.0–0.1)
Basophils Relative: 1 %
Eosinophils Absolute: 0 10*3/uL (ref 0.0–0.5)
Eosinophils Relative: 0 %
HCT: 39.1 % (ref 39.0–52.0)
Hemoglobin: 12.8 g/dL — ABNORMAL LOW (ref 13.0–17.0)
Immature Granulocytes: 1 %
Lymphocytes Relative: 19 %
Lymphs Abs: 1.2 10*3/uL (ref 0.7–4.0)
MCH: 33.1 pg (ref 26.0–34.0)
MCHC: 32.7 g/dL (ref 30.0–36.0)
MCV: 101 fL — ABNORMAL HIGH (ref 80.0–100.0)
Monocytes Absolute: 0.4 10*3/uL (ref 0.1–1.0)
Monocytes Relative: 6 %
Neutro Abs: 4.7 10*3/uL (ref 1.7–7.7)
Neutrophils Relative %: 73 %
Platelets: 203 10*3/uL (ref 150–400)
RBC: 3.87 MIL/uL — ABNORMAL LOW (ref 4.22–5.81)
RDW: 13.3 % (ref 11.5–15.5)
WBC: 6.5 10*3/uL (ref 4.0–10.5)
nRBC: 0 % (ref 0.0–0.2)

## 2021-02-10 LAB — COMPREHENSIVE METABOLIC PANEL
ALT: 8 U/L (ref 0–44)
AST: 13 U/L — ABNORMAL LOW (ref 15–41)
Albumin: 3.6 g/dL (ref 3.5–5.0)
Alkaline Phosphatase: 79 U/L (ref 38–126)
Anion gap: 9 (ref 5–15)
BUN: 26 mg/dL — ABNORMAL HIGH (ref 8–23)
CO2: 20 mmol/L — ABNORMAL LOW (ref 22–32)
Calcium: 8.9 mg/dL (ref 8.9–10.3)
Chloride: 116 mmol/L — ABNORMAL HIGH (ref 98–111)
Creatinine, Ser: 1.07 mg/dL (ref 0.61–1.24)
GFR, Estimated: >60 mL/min (ref >60)
Glucose, Bld: 82 mg/dL (ref 70–99)
Potassium: 3.8 mmol/L (ref 3.5–5.1)
Sodium: 145 mmol/L (ref 135–145)
Total Bilirubin: 1 mg/dL (ref 0.3–1.2)
Total Protein: 6.9 g/dL (ref 6.5–8.1)

## 2021-02-10 MED ORDER — ONDANSETRON HCL 4 MG/2ML IJ SOLN: 4.0000 mg | Freq: Four times a day (QID) | INTRAMUSCULAR | Status: DC | PRN

## 2021-02-10 MED ORDER — ALPRAZOLAM 0.25 MG PO TABS
0.2500 mg | ORAL_TABLET | Freq: Three times a day (TID) | ORAL | Status: DC | PRN
  Administered 2021-02-12 – 2021-02-14 (×4): 0.25 mg via ORAL
  Filled 2021-02-10 (×4): qty 1

## 2021-02-10 MED ORDER — DONEPEZIL HCL 5 MG PO TABS
10.0000 mg | ORAL_TABLET | Freq: Every day | ORAL | Status: DC
  Administered 2021-02-11 – 2021-02-16 (×6): 10 mg via ORAL
  Filled 2021-02-10 (×6): qty 2

## 2021-02-10 MED ORDER — ACETAMINOPHEN 650 MG RE SUPP: 650.0000 mg | Freq: Four times a day (QID) | RECTAL | Status: DC | PRN

## 2021-02-10 MED ORDER — VITAMIN D (ERGOCALCIFEROL) 1.25 MG (50000 UNIT) PO CAPS
50000.0000 [IU] | ORAL_CAPSULE | ORAL | Status: DC
  Administered 2021-02-14: 50000 [IU] via ORAL
  Filled 2021-02-10 (×2): qty 1

## 2021-02-10 MED ORDER — ACETAMINOPHEN 325 MG PO TABS
650.0000 mg | ORAL_TABLET | Freq: Four times a day (QID) | ORAL | Status: DC | PRN
  Administered 2021-02-14 – 2021-02-17 (×2): 650 mg via ORAL
  Filled 2021-02-10 (×2): qty 2

## 2021-02-10 MED ORDER — ENSURE ENLIVE PO LIQD
237.0000 mL | Freq: Two times a day (BID) | ORAL | Status: DC
  Administered 2021-02-11 (×2): 237 mL via ORAL
  Filled 2021-02-10 (×2): qty 237

## 2021-02-10 MED ORDER — VITAMIN B-12 1000 MCG PO TABS
1000.0000 ug | ORAL_TABLET | Freq: Every day | ORAL | Status: DC
  Administered 2021-02-12 – 2021-02-17 (×6): 1000 ug via ORAL
  Filled 2021-02-10 (×7): qty 1

## 2021-02-10 MED ORDER — ASPIRIN EC 81 MG PO TBEC
81.0000 mg | DELAYED_RELEASE_TABLET | Freq: Every day | ORAL | Status: DC
  Administered 2021-02-12 – 2021-02-17 (×6): 81 mg via ORAL
  Filled 2021-02-10 (×7): qty 1

## 2021-02-10 MED ORDER — FERROUS SULFATE 325 (65 FE) MG PO TBEC: 325.0000 mg | DELAYED_RELEASE_TABLET | Freq: Two times a day (BID) | ORAL | Status: DC

## 2021-02-10 MED ORDER — QUETIAPINE FUMARATE 25 MG PO TABS
25.0000 mg | ORAL_TABLET | Freq: Every day | ORAL | Status: DC
  Administered 2021-02-12 – 2021-02-17 (×6): 25 mg via ORAL
  Filled 2021-02-10 (×7): qty 1

## 2021-02-10 MED ORDER — ATORVASTATIN CALCIUM 40 MG PO TABS
80.0000 mg | ORAL_TABLET | Freq: Every day | ORAL | Status: DC
  Administered 2021-02-11 – 2021-02-16 (×6): 80 mg via ORAL
  Filled 2021-02-10 (×7): qty 2

## 2021-02-10 MED ORDER — PANTOPRAZOLE SODIUM 40 MG PO TBEC
40.0000 mg | DELAYED_RELEASE_TABLET | Freq: Every day | ORAL | Status: DC
  Administered 2021-02-12 – 2021-02-17 (×6): 40 mg via ORAL
  Filled 2021-02-10 (×7): qty 1

## 2021-02-10 MED ORDER — ONDANSETRON HCL 4 MG PO TABS: 4.0000 mg | ORAL_TABLET | Freq: Four times a day (QID) | ORAL | Status: DC | PRN

## 2021-02-10 MED ORDER — MORPHINE SULFATE (PF) 2 MG/ML IV SOLN
2.0000 mg | INTRAVENOUS | Status: DC | PRN
  Administered 2021-02-12: 2 mg via INTRAVENOUS
  Filled 2021-02-10: qty 1

## 2021-02-10 MED ORDER — HEPARIN SODIUM (PORCINE) 5000 UNIT/ML IJ SOLN
5000.0000 [IU] | Freq: Three times a day (TID) | INTRAMUSCULAR | Status: DC
  Administered 2021-02-11 – 2021-02-16 (×13): 5000 [IU] via SUBCUTANEOUS
  Filled 2021-02-10 (×15): qty 1

## 2021-02-10 NOTE — H&P (Signed)
TRH H&P    Patient Demographics:    Edward Trevino, is a 87 y.o. male  MRN: [id number]  DOB - 12/16/1931  Admit Date - 02/10/2021  Referring MD/NP/PA: Karle Starch   Outpatient Primary MD for the patient is Leeanne Rio, MD  Patient coming from: Home  Chief complaint- decreased PO intake   HPI:    Edward Trevino  is a 87 y.o. male, with history of anxiety, dementia, GERD with esophagitis, coronary artery disease, ischemic cardiomyopathy, major depression with history of suicide attempt, and more presents the ED with a chief complaint of decreased p.o. intake.  Unfortunately family has left at time of admission, and patient has dementia and is not able to provide any history.  He reports that he is not in any pain at this time.  He does know his name, but not the time, place, or context.  According to the ED staff, son brought patient in for generalized weakness, and decrease in p.o. intake over the last few days.  ED provider attempted to call son without answer.  Approximately 14 months ago patient was discharged from here after admission for generalized weakness and failure to thrive.  At that time nutritional supplements were started, and was advised to continue to encourage p.o. intake and adequate hydration.  Soft diet was also encouraged to facilitate chewing. Patient is discharged to [facility] was reportedly then transferred to [facility] rehab.  He was there for 9 months, and sent home with son 3 months ago.  In the last 3 months that is reported the patient has been declining, but worse so over the last few days.  In the ED today try to ambulate patient and he required max assist, and would not be safe to discharge home.  No further history could be obtained at this time.  In the ED Temp 97.8, heart rate 57-63, respiratory rate 12, blood pressure 135/67, satting 100% No leukocytosis, hemoglobin is 12.8, chemistry  panels mostly unremarkable with a low normal glucose of 82 EKG shows a heart rate of 54, sinus bradycardia, QTC 435 Patient was brought in by EMS they reported to the ED staff that patient was sent here for decrease in eating and drinking.    Review of systems:    Review of systems could not be obtained secondary to dementia    Past History of the following :    Past Medical History:  Diagnosis Date   Anxiety    Bladder stones 05/22/2015   Choledocholithiasis with obstruction 07/25/2012   Dementia ([place])    Depression    Duodenitis 05/11/15   Erosive esophagitis    Esophageal stricture 05/11/15   Dilated   Gastric out let obstruction    Gastric outlet obstruction 05/07/2015   HOH (hard of hearing)    Insomnia    Ischemic cardiomyopathy    LVEF 20%   Multiple duodenal ulcers 05/11/15   2  were present per EGD.   ST elevation myocardial infarction (STEMI) of anterior wall [facility])    October 2016 - late  presentation, managed conservatively   Suicide attempt [facility])       Past Surgical History:  Procedure Laterality Date   APPENDECTOMY     BACK SURGERY     BALLOON DILATION N/A 07/24/2012   Procedure: BALLOON DILATION;  Surgeon: Daneil Dolin, MD;  Location: [facility];  Service: Endoscopy;  Laterality: N/A;  Balloon Stone Extraction, Drudging   BIOPSY N/A 07/24/2012   Procedure: BIOPSY;  Surgeon: Daneil Dolin, MD;  Location: [facility];  Service: Endoscopy;  Laterality: N/A;  Duodenal and Esophageal Biopsies   BIOPSY N/A 10/18/2012   Procedure: BIOPSY;  Surgeon: Daneil Dolin, MD;  Location: [facility];  Service: Endoscopy;  Laterality: N/A;   CHOLECYSTECTOMY N/A 10/26/2012   Procedure: LAPAROSCOPIC CHOLECYSTECTOMY;  Surgeon: Jamesetta So, MD;  Location: [facility];  Service: General;  Laterality: N/A;   EGD/ERCP  10/18/2012   Dr. Gala Romney: ;yloric channel stenosis, s/p dilation and bx, persisting CBD stones with extension of sphincterotomy and sphincterotomy balloon dilation and stone extraction.  Removal of biliary stent   ERCP N/A 07/24/2012   Dr. Gala Romney. Significant abnormalities of the bowl and proximal second portion producing partial gastric outlet stricture and with secondary gastric dilation and severe erosive reflux esophagitis. Biopsy showed benign ulceration. Normal-appearing ampulla, status post biliary sphincterotomy and ampulla balloon dilation, stone extraction and stent placement.   ERCP N/A 10/18/2012   Procedure: ENDOSCOPIC RETROGRADE CHOLANGIOPANCREATOGRAPHY (ERCP);  Surgeon: Daneil Dolin, MD;  Location: [facility];  Service: Endoscopy;  Laterality: N/A;   ESOPHAGEAL DILATION N/A 05/15/2015   Procedure: ESOPHAGEAL DILATION;  Surgeon: Danie Binder, MD;  Location[facility];  Service: Endoscopy;  Laterality: N/A;   ESOPHAGOGASTRODUODENOSCOPY N/A 07/24/2012   RCV:[id number] ampulla s/p biliary sphincterotomy and ampullary   ESOPHAGOGASTRODUODENOSCOPY N/A 05/07/2015   RMR: Severe esophagitis, retained food in the stomach precluded complete exam, pyloric channel/duodenal bulb abnormal with 1 cm ulcer, friability with high-grade gastric outlet obstruction. No dilation performed on Plavix and aspirin.   ESOPHAGOGASTRODUODENOSCOPY N/A 05/11/2015   Rehman: Distal esophageal ulcers with circumferential ulceration at distal 3-4 cm, soft stricture dilated with scope, diffuse erythema and edema in the bulbar mucosa with 2 small ulcers in the distal segment and high-grade stricture in the middle, scope could not be passed. Dilated up to 12 mm. Could not see duodenum beyond this point because the scope was retained in the large stomach.   ESOPHAGOGASTRODUODENOSCOPY N/A 05/15/2015   SLF: Esophagitis with stricture, dilated GE junction stricture up to 14 mm with the balloon, D1/D2 stricture with narrowing of the lumen to 10-11 mm, dilated up to 15 mm with balloon. Downstream duodenum appear normal. Small duodenal bulbar ulcer. Biopsies from the stomach benign gastritis, no H pylori.    ESOPHAGOGASTRODUODENOSCOPY (EGD) WITH PROPOFOL N/A 10/18/2012   IDP:OEUMPNT channel stenosis s/p dilation/Persisting common bile duct stones with extension of sphincterotomy     and sphincterotomy balloon dilation and stone extraction.  Biliary stent removal    LAPAROSCOPY N/A 10/31/2012   Procedure: LAPAROSCOPY DIAGNOSTIC;  Surgeon: Donato Heinz, MD;  Location: [facility];  Service: General;  Laterality: N/A;   REMOVAL OF STONES N/A 07/24/2012   Procedure: REMOVAL OF STONES;  Surgeon: Daneil Dolin, MD;  Location: [facility];  Service: Endoscopy;  Laterality: N/A;   REMOVAL OF STONES N/A 10/18/2012   Procedure: REMOVAL OF STONES;  Surgeon: Daneil Dolin, MD;  Location: [facility];  Service: Endoscopy;  Laterality: N/A;   SPHINCTEROTOMY N/A 07/24/2012   Procedure:  SPHINCTEROTOMY;  Surgeon: Daneil Dolin, MD;  Location: [facility];  Service: Endoscopy;  Laterality: N/A;      Social History:      Social History   Tobacco Use   Smoking status: Every Day    Types: Cigars   Smokeless tobacco: Never   Tobacco comments:    Smokes 4-5 cigars a day "but I don't really inhale."  Substance Use Topics   Alcohol use: No    Alcohol/week: 0.0 standard drinks       Family History :     Family History  Problem Relation Age of Onset   Depression Sister    Other Mother        unsure of history   Other Father        never had relationship w/ father   Colon cancer Neg Hx    Heart disease Neg Hx       Home Medications:   Prior to Admission medications   Medication Sig Start Date End Date Taking? Authorizing Provider  aspirin EC 81 MG tablet Take 1 tablet (81 mg total) by mouth daily with breakfast. 12/21/18   Denton Brick, Courage, MD  atorvastatin (LIPITOR) 80 MG tablet Take 80 mg by mouth daily at 6 PM.    [provider]  carvedilol (COREG) 3.125 MG tablet Take 1 tablet (3.125 mg total) by mouth 2 (two) times daily with a meal. 12/21/18   Emokpae, Courage, MD  donepezil (ARICEPT) 10 MG tablet Take 1  tablet (10 mg total) by mouth at bedtime. 11/21/19   Marcial Pacas, MD  feeding supplement, ENSURE ENLIVE, (ENSURE ENLIVE) LIQD Take 237 mLs by mouth 2 (two) times daily between meals. 12/05/19   Barton Dubois, MD  ferrous sulfate 325 (65 FE) MG EC tablet Take 1 tablet (325 mg total) by mouth 2 (two) times daily. 12/21/18   Roxan Hockey, MD  nitroGLYCERIN (NITROSTAT) 0.4 MG SL tablet Place 1 tablet (0.4 mg total) under the tongue every 5 (five) minutes as needed for chest pain. 12/05/19 12/04/20  Barton Dubois, MD  pantoprazole (PROTONIX) 40 MG tablet Take 1 tablet (40 mg total) by mouth daily. 12/21/18   Roxan Hockey, MD  QUEtiapine (SEROQUEL) 50 MG tablet Take 1 tablet (50 mg total) by mouth daily. 12/05/19   Barton Dubois, MD  vitamin B-12 1000 MCG tablet Take 1 tablet (1,000 mcg total) by mouth daily. 12/05/19   Barton Dubois, MD  Vitamin D, Ergocalciferol, (DRISDOL) 1.25 MG (50000 UT) CAPS capsule Take 1 capsule (50,000 Units total) by mouth every Sunday. 12/23/18   Roxan Hockey, MD     Allergies:    No Known Allergies   Physical Exam:   Vitals  Blood pressure 124/78, pulse (!) 57, temperature 97.8 F (36.6 C), temperature source Oral, resp. rate 17, height 6\' 3"  (1.905 m), weight 57.2 kg, SpO2 98 %.  1.  General: Patient lying supine in bed,  no acute distress   2. Psychiatric: Alert and oriented x 1, fluctuates between pleasant and cooperative, too unpleasant and resistant rapidly   3. Neurologic: Speech and language are normal, face is symmetric, moves all 4 extremities voluntarily, at baseline without acute deficits on limited exam   4. HEENMT:  Head is atraumatic, normocephalic, pupils reactive to light, neck is cachectic, trachea is midline, mucous membranes are moist   5. Respiratory : Lungs are clear to auscultation bilaterally without wheezing, rhonchi, rales, no cyanosis, no increase in work of breathing or accessory muscle use  6. Cardiovascular : Heart rate  normal, rhythm is regular, no murmurs, rubs or gallops, no peripheral edema, peripheral pulses palpated   7. Gastrointestinal:  Abdomen is flat, soft, nondistended, nontender to palpation bowel sounds active, no masses or organomegaly palpated   8. Skin:  Skin is warm, dry and intact without rashes, acute lesions, or ulcers on limited exam   9.Musculoskeletal:  No acute deformities or trauma, no asymmetry in tone, no peripheral edema, peripheral pulses palpated, no tenderness to palpation in the extremities    Data Review:    CBC Recent Labs  Lab 02/10/21 1853  WBC 6.5  HGB 12.8*  HCT 39.1  PLT 203  MCV 101.0*  MCH 33.1  MCHC 32.7  RDW 13.3  LYMPHSABS 1.2  MONOABS 0.4  EOSABS 0.0  BASOSABS 0.1   ------------------------------------------------------------------------------------------------------------------  Results for orders placed or performed during the hospital encounter of 02/10/21 (from the past 48 hour(s))  Comprehensive metabolic panel     Status: Abnormal   Collection Time: 02/10/21  6:05 PM  Result Value Ref Range   Sodium 145 135 - 145 mmol/L   Potassium 3.8 3.5 - 5.1 mmol/L   Chloride 116 (H) 98 - 111 mmol/L   CO2 20 (L) 22 - 32 mmol/L   Glucose, Bld 82 70 - 99 mg/dL    Comment: Glucose reference range applies only to samples taken after fasting for at least 8 hours.   BUN 26 (H) 8 - 23 mg/dL   Creatinine, Ser 1.07 0.61 - 1.24 mg/dL   Calcium 8.9 8.9 - 10.3 mg/dL   Total Protein 6.9 6.5 - 8.1 g/dL   Albumin 3.6 3.5 - 5.0 g/dL   AST 13 (L) 15 - 41 U/L   ALT 8 0 - 44 U/L   Alkaline Phosphatase 79 38 - 126 U/L   Total Bilirubin 1.0 0.3 - 1.2 mg/dL   GFR, Estimated >60 >60 mL/min    Comment: (NOTE) Calculated using the CKD-EPI Creatinine Equation (2021)    Anion gap 9 5 - 15    Comment: Performed at [facility], [address] [place], [place] [address]  CBC with Differential/Platelet     Status: Abnormal   Collection Time: 02/10/21  6:53 PM   Result Value Ref Range   WBC 6.5 4.0 - 10.5 K/uL   RBC 3.87 (L) 4.22 - 5.81 MIL/uL   Hemoglobin 12.8 (L) 13.0 - 17.0 g/dL   HCT 39.1 39.0 - 52.0 %   MCV 101.0 (H) 80.0 - 100.0 fL   MCH 33.1 26.0 - 34.0 pg   MCHC 32.7 30.0 - 36.0 g/dL   RDW 13.3 11.5 - 15.5 %   Platelets 203 150 - 400 K/uL   nRBC 0.0 0.0 - 0.2 %   Neutrophils Relative % 73 %   Neutro Abs 4.7 1.7 - 7.7 K/uL   Lymphocytes Relative 19 %   Lymphs Abs 1.2 0.7 - 4.0 K/uL   Monocytes Relative 6 %   Monocytes Absolute 0.4 0.1 - 1.0 K/uL   Eosinophils Relative 0 %   Eosinophils Absolute 0.0 0.0 - 0.5 K/uL   Basophils Relative 1 %   Basophils Absolute 0.1 0.0 - 0.1 K/uL   Immature Granulocytes 1 %   Abs Immature Granulocytes 0.09 (H) 0.00 - 0.07 K/uL    Comment: Performed at [facility], [address], [place], [place] [address]    Chemistries  Recent Labs  Lab 02/10/21 1805  NA 145  K 3.8  CL  116*  CO2 20*  GLUCOSE 82  BUN 26*  CREATININE 1.07  CALCIUM 8.9  AST 13*  ALT 8  ALKPHOS 79  BILITOT 1.0   ------------------------------------------------------------------------------------------------------------------  ------------------------------------------------------------------------------------------------------------------ GFR: Estimated Creatinine Clearance: 39.4 mL/min (by C-G formula based on SCr of 1.07 mg/dL). Liver Function Tests: Recent Labs  Lab 02/10/21 1805  AST 13*  ALT 8  ALKPHOS 79  BILITOT 1.0  PROT 6.9  ALBUMIN 3.6   No results for input(s): LIPASE, AMYLASE in the last 168 hours. No results for input(s): AMMONIA in the last 168 hours. Coagulation Profile: No results for input(s): INR, PROTIME in the last 168 hours. Cardiac Enzymes: No results for input(s): CKTOTAL, CKMB, CKMBINDEX, TROPONINI in the last 168 hours. BNP (last 3 results) No results for input(s): PROBNP in the last 8760 hours. HbA1C: No results for input(s): HGBA1C in the last 72 hours. CBG: No results for  input(s): GLUCAP in the last 168 hours. Lipid Profile: No results for input(s): CHOL, HDL, LDLCALC, TRIG, CHOLHDL, LDLDIRECT in the last 72 hours. Thyroid Function Tests: No results for input(s): TSH, T4TOTAL, FREET4, T3FREE, THYROIDAB in the last 72 hours. Anemia Panel: No results for input(s): VITAMINB12, FOLATE, FERRITIN, TIBC, IRON, RETICCTPCT in the last 72 hours.  --------------------------------------------------------------------------------------------------------------- Urine analysis:    Component Value Date/Time   COLORURINE YELLOW 11/27/2019 1342   APPEARANCEUR CLEAR 11/27/2019 1342   LABSPEC 1.026 11/27/2019 1342   PHURINE 5.0 11/27/2019 1342   GLUCOSEU NEGATIVE 11/27/2019 1342   HGBUR MODERATE (A) 11/27/2019 1342   BILIRUBINUR NEGATIVE 11/27/2019 1342   KETONESUR 5 (A) 11/27/2019 1342   PROTEINUR 30 (A) 11/27/2019 1342   UROBILINOGEN 0.2 12/24/2012 1057   NITRITE NEGATIVE 11/27/2019 1342   LEUKOCYTESUR NEGATIVE 11/27/2019 1342      Imaging Results:    No results found.     Assessment & Plan:    Active Problems:   CAD/History of STEMI-Oct 2016- conservative Rx   PAFib/Atrial fibrillation   Dementia with behavioral disturbance (HCC)   Failure to thrive in adult   Failure to thrive Declining since he has been home Will likely need placement Consult transition of care PT eval and treat Continue nutritional supplements Continue to monitor Generalized weakness Most likely secondary to anorexia Patient is bradycardic and symptomatic bradycardia could be contributing -hold Coreg Encourage p.o. intake, continue B12 and iron supplementation PT eval and treat No focal deficits Will likely need placement Continue to monitor CAD Continue aspirin, statin, temporarily holding Coreg due to bradycardia No chest pain or shortness of breath Continue heart healthy diet Dementia depression, and anxiety Given generalized weakness, and what could be described as  fatigue based on documentation, will reduce dose of Seroquel, continue Aricept Paroxysmal atrial fibrillation Not on anticoagulation due to fall risk Currently in sinus rhythm Continue to monitor  DVT Prophylaxis-   Heparin- SCDs   AM Labs Ordered, also please review Full Orders  Family Communication: No family at bedside or reachable  Code Status: DNR  Admission status:Inpatient :The appropriate admission status for this patient is INPATIENT. Inpatient status is judged to be reasonable and necessary in order to provide the required intensity of service to ensure the patient's safety. The patient's presenting symptoms, physical exam findings, and initial radiographic and laboratory data in the context of their chronic comorbidities is felt to place them at high risk for further clinical deterioration. Furthermore, it is not anticipated that the patient will be medically stable for discharge from the hospital within 2 midnights of  admission. The following factors support the admission status of inpatient.     The patient's presenting symptoms include generalized weakness, decreased p.o. intake. The worrisome physical exam findings include requiring max assist for ambulation. The chronic co-morbidities include coronary artery disease, dementia.       * I certify that at the point of admission it is my clinical judgment that the patient will require inpatient hospital care spanning beyond 2 midnights from the point of admission due to high intensity of service, high risk for further deterioration and high frequency of surveillance required.*  Time spent in minutes : [place]

## 2021-02-10 NOTE — ED Triage Notes (Signed)
Pt.arrived via [facility]. Per son pt is not eating or drinking for about a week now and is having increasing weakness.

## 2021-02-10 NOTE — ED Notes (Signed)
ED Provider at bedside. 

## 2021-02-10 NOTE — ED Provider Notes (Signed)
[facility] EMERGENCY DEPARTMENT Provider Note  CSN: [id number] Arrival date & time: 02/10/21 1704    History Chief Complaint  Patient presents with   Weakness    Edward Trevino is a 87 y.o. male brought to the ED via EMS from home. Patient denies any complaints. Per EMS they were called by son due patient not eating or drinking and having generalized weakness. I attempted to call son but did not get an answer. I also called other listed numbers without success. Patient states he lives alone. He had an admission for similar in 2021, could not stand on his own then, spent some time at [facility] before going back home.    Past Medical History:  Diagnosis Date   Anxiety    Bladder stones 05/22/2015   Choledocholithiasis with obstruction 07/25/2012   Dementia ([place])    Depression    Duodenitis 05/11/15   Erosive esophagitis    Esophageal stricture 05/11/15   Dilated   Gastric out let obstruction    Gastric outlet obstruction 05/07/2015   HOH (hard of hearing)    Insomnia    Ischemic cardiomyopathy    LVEF 20%   Multiple duodenal ulcers 05/11/15   2  were present per EGD.   ST elevation myocardial infarction (STEMI) of anterior wall [facility])    October 2016 - late presentation, managed conservatively   Suicide attempt [facility])     Past Surgical History:  Procedure Laterality Date   APPENDECTOMY     BACK SURGERY     BALLOON DILATION N/A 07/24/2012   Procedure: BALLOON DILATION;  Surgeon: Daneil Dolin, MD;  Location: [facility] ORS;  Service: Endoscopy;  Laterality: N/A;  Balloon Stone Extraction, Drudging   BIOPSY N/A 07/24/2012   Procedure: BIOPSY;  Surgeon: Daneil Dolin, MD;  Location: [facility] ORS;  Service: Endoscopy;  Laterality: N/A;  Duodenal and Esophageal Biopsies   BIOPSY N/A 10/18/2012   Procedure: BIOPSY;  Surgeon: Daneil Dolin, MD;  Location: [facility] ORS;  Service: Endoscopy;  Laterality: N/A;   CHOLECYSTECTOMY N/A 10/26/2012   Procedure: LAPAROSCOPIC CHOLECYSTECTOMY;  Surgeon: Jamesetta So, MD;  Location: [facility] ORS;  Service: General;  Laterality: N/A;   EGD/ERCP  10/18/2012   Dr. Gala Romney: ;yloric channel stenosis, s/p dilation and bx, persisting CBD stones with extension of sphincterotomy and sphincterotomy balloon dilation and stone extraction. Removal of biliary stent   ERCP N/A 07/24/2012   Dr. Gala Romney. Significant abnormalities of the bowl and proximal second portion producing partial gastric outlet stricture and with secondary gastric dilation and severe erosive reflux esophagitis. Biopsy showed benign ulceration. Normal-appearing ampulla, status post biliary sphincterotomy and ampulla balloon dilation, stone extraction and stent placement.   ERCP N/A 10/18/2012   Procedure: ENDOSCOPIC RETROGRADE CHOLANGIOPANCREATOGRAPHY (ERCP);  Surgeon: Daneil Dolin, MD;  Location: [facility] ORS;  Service: Endoscopy;  Laterality: N/A;   ESOPHAGEAL DILATION N/A 05/15/2015   Procedure: ESOPHAGEAL DILATION;  Surgeon: Danie Binder, MD;  Location: [facility] ENDO SUITE;  Service: Endoscopy;  Laterality: N/A;   ESOPHAGOGASTRODUODENOSCOPY N/A 07/24/2012   EXH:[id number] ampulla s/p biliary sphincterotomy and ampullary   ESOPHAGOGASTRODUODENOSCOPY N/A 05/07/2015   RMR: Severe esophagitis, retained food in the stomach precluded complete exam, pyloric channel/duodenal bulb abnormal with 1 cm ulcer, friability with high-grade gastric outlet obstruction. No dilation performed on Plavix and aspirin.   ESOPHAGOGASTRODUODENOSCOPY N/A 05/11/2015   Rehman: Distal esophageal ulcers with circumferential ulceration at distal 3-4 cm, soft stricture dilated with scope, diffuse erythema and edema in the  bulbar mucosa with 2 small ulcers in the distal segment and high-grade stricture in the middle, scope could not be passed. Dilated up to 12 mm. Could not see duodenum beyond this point because the scope was retained in the large stomach.   ESOPHAGOGASTRODUODENOSCOPY N/A 05/15/2015   SLF: Esophagitis with stricture, dilated GE  junction stricture up to 14 mm with the balloon, D1/D2 stricture with narrowing of the lumen to 10-11 mm, dilated up to 15 mm with balloon. Downstream duodenum appear normal. Small duodenal bulbar ulcer. Biopsies from the stomach benign gastritis, no H pylori.   ESOPHAGOGASTRODUODENOSCOPY (EGD) WITH PROPOFOL N/A 10/18/2012   ZWC:HENIDPO channel stenosis s/p dilation/Persisting common bile duct stones with extension of sphincterotomy     and sphincterotomy balloon dilation and stone extraction.  Biliary stent removal    LAPAROSCOPY N/A 10/31/2012   Procedure: LAPAROSCOPY DIAGNOSTIC;  Surgeon: Donato Heinz, MD;  Location: [facility] ORS;  Service: General;  Laterality: N/A;   REMOVAL OF STONES N/A 07/24/2012   Procedure: REMOVAL OF STONES;  Surgeon: Daneil Dolin, MD;  Location: [facility] ORS;  Service: Endoscopy;  Laterality: N/A;   REMOVAL OF STONES N/A 10/18/2012   Procedure: REMOVAL OF STONES;  Surgeon: Daneil Dolin, MD;  Location: [facility] ORS;  Service: Endoscopy;  Laterality: N/A;   SPHINCTEROTOMY N/A 07/24/2012   Procedure: SPHINCTEROTOMY;  Surgeon: Daneil Dolin, MD;  Location: [facility] ORS;  Service: Endoscopy;  Laterality: N/A;    Family History  Problem Relation Age of Onset   Depression Sister    Other Mother        unsure of history   Other Father        never had relationship w/ father   Colon cancer Neg Hx    Heart disease Neg Hx     Social History   Tobacco Use   Smoking status: Every Day    Types: Cigars   Smokeless tobacco: Never   Tobacco comments:    Smokes 4-5 cigars a day "but I don't really inhale."  Vaping Use   Vaping Use: Never used  Substance Use Topics   Alcohol use: No    Alcohol/week: 0.0 standard drinks   Drug use: Never     Home Medications Prior to Admission medications   Medication Sig Start Date End Date Taking? Authorizing Provider  aspirin EC 81 MG tablet Take 1 tablet (81 mg total) by mouth daily with breakfast. 12/21/18   Denton Brick, Courage, MD  atorvastatin  (LIPITOR) 80 MG tablet Take 80 mg by mouth daily at 6 PM.    [provider]  carvedilol (COREG) 3.125 MG tablet Take 1 tablet (3.125 mg total) by mouth 2 (two) times daily with a meal. 12/21/18   Emokpae, Courage, MD  donepezil (ARICEPT) 10 MG tablet Take 1 tablet (10 mg total) by mouth at bedtime. 11/21/19   Marcial Pacas, MD  feeding supplement, ENSURE ENLIVE, (ENSURE ENLIVE) LIQD Take 237 mLs by mouth 2 (two) times daily between meals. 12/05/19   Barton Dubois, MD  ferrous sulfate 325 (65 FE) MG EC tablet Take 1 tablet (325 mg total) by mouth 2 (two) times daily. 12/21/18   Roxan Hockey, MD  nitroGLYCERIN (NITROSTAT) 0.4 MG SL tablet Place 1 tablet (0.4 mg total) under the tongue every 5 (five) minutes as needed for chest pain. 12/05/19 12/04/20  Barton Dubois, MD  pantoprazole (PROTONIX) 40 MG tablet Take 1 tablet (40 mg total) by mouth daily. 12/21/18   Roxan Hockey, MD  QUEtiapine (  SEROQUEL) 50 MG tablet Take 1 tablet (50 mg total) by mouth daily. 12/05/19   Barton Dubois, MD  vitamin B-12 1000 MCG tablet Take 1 tablet (1,000 mcg total) by mouth daily. 12/05/19   Barton Dubois, MD  Vitamin D, Ergocalciferol, (DRISDOL) 1.25 MG (50000 UT) CAPS capsule Take 1 capsule (50,000 Units total) by mouth every Sunday. 12/23/18   Roxan Hockey, MD     Allergies    Patient has no known allergies.   Review of Systems   Review of Systems A comprehensive review of systems was completed and negative except as noted in HPI.    Physical Exam BP 135/67   Pulse (!) 57   Temp 97.8 F (36.6 C) (Oral)   Resp 12   Ht 6\' 3"  (1.905 m)   Wt 57.2 kg   SpO2 100%   BMI 15.75 kg/m   Physical Exam Vitals and nursing note reviewed.  Constitutional:      Appearance: Normal appearance.  HENT:     Head: Normocephalic and atraumatic.     Nose: Nose normal.     Mouth/Throat:     Mouth: Mucous membranes are moist.  Eyes:     Extraocular Movements: Extraocular movements intact.      Conjunctiva/sclera: Conjunctivae normal.  Cardiovascular:     Rate and Rhythm: Normal rate.  Pulmonary:     Effort: Pulmonary effort is normal.     Breath sounds: Normal breath sounds.  Abdominal:     General: Abdomen is flat.     Palpations: Abdomen is soft.     Tenderness: There is no abdominal tenderness.  Musculoskeletal:        General: No swelling. Normal range of motion.     Cervical back: Neck supple.  Skin:    General: Skin is warm and dry.  Neurological:     General: No focal deficit present.     Mental Status: He is alert.  Psychiatric:        Mood and Affect: Mood normal.     ED Results / Procedures / Treatments   Labs (all labs ordered are listed, but only abnormal results are displayed) Labs Reviewed  COMPREHENSIVE METABOLIC PANEL - Abnormal; Notable for the following components:      Result Value   Chloride 116 (*)    CO2 20 (*)    BUN 26 (*)    AST 13 (*)    All other components within normal limits  CBC WITH DIFFERENTIAL/PLATELET - Abnormal; Notable for the following components:   RBC 3.87 (*)    Hemoglobin 12.8 (*)    MCV 101.0 (*)    Abs Immature Granulocytes 0.09 (*)    All other components within normal limits  CBC WITH DIFFERENTIAL/PLATELET  URINALYSIS, ROUTINE W REFLEX MICROSCOPIC  TSH  CBC  COMPREHENSIVE METABOLIC PANEL    EKG EKG Interpretation  Date/Time:  Wednesday February 10 2021 18:15:23 EDT Ventricular Rate:  54 PR Interval:  255 QRS Duration: 102 QT Interval:  459 QTC Calculation: 435 R Axis:   -70 Text Interpretation: Sinus rhythm Ventricular premature complex Prolonged PR interval Left anterior fascicular block Anteroseptal infarct, age indeterminate No significant change since last tracing Confirmed by Calvert Cantor [phone]) on 02/10/2021 6:19:52 PM  Radiology No results found.  Procedures Procedures  Medications Ordered in the ED Medications  aspirin EC tablet 81 mg (has no administration in time range)   atorvastatin (LIPITOR) tablet 80 mg (has no administration in time range)  donepezil (ARICEPT) tablet  10 mg (has no administration in time range)  QUEtiapine (SEROQUEL) tablet 25 mg (has no administration in time range)  pantoprazole (PROTONIX) EC tablet 40 mg (has no administration in time range)  ferrous sulfate EC tablet 325 mg (has no administration in time range)  vitamin B-12 (CYANOCOBALAMIN) tablet 1,000 mcg (has no administration in time range)  feeding supplement (ENSURE ENLIVE / ENSURE PLUS) liquid 237 mL (has no administration in time range)  Vitamin D (Ergocalciferol) (DRISDOL) capsule 50,000 Units (has no administration in time range)  heparin injection 5,000 Units (has no administration in time range)  acetaminophen (TYLENOL) tablet 650 mg (has no administration in time range)    Or  acetaminophen (TYLENOL) suppository 650 mg (has no administration in time range)  morphine 2 MG/ML injection 2 mg (has no administration in time range)  ondansetron (ZOFRAN) tablet 4 mg (has no administration in time range)    Or  ondansetron (ZOFRAN) injection 4 mg (has no administration in time range)  ALPRAZolam (XANAX) tablet 0.25 mg (has no administration in time range)     MDM Rules/Calculators/A&P MDM Unclear what the patient is here for. He denies any complaints and family is not available. Will check basic labs and attempt to call family again soon.   ED Course  I have reviewed the triage vital signs and the nursing notes.  Pertinent labs & imaging results that were available during my care of the patient were reviewed by me and considered in my medical decision making (see chart for details).  Clinical Course as of 02/10/21 2145  Wed Feb 10, 2021  1909 CBC is unremarkable. CMP without significant abnormalities.  [CS]  2029 Spoke with son who is now at bedside. He states after the patient's admission last year he went to [place] and then to the [place] in [place] for 9 months. He has  been back home, living with the son for the last 3 months and has been doing gradually worse during that time until the last few days when he has refused to eat or drink anything. Has been saying he can't swallow. He has not been able to walk safely, uses a shuffling gait. Will check ambulation across the hall to the bathroom to collect a urine specimen.   [CS]  2105 Per RN, patient required max assistance to get up, unable to give a urine specimen. Will discuss dispo with the hospitalist.  [CS]  2125 Spoke with Dr. Clearence Ped, Hospitalist, who will evaluate for admission.  [CS]    Clinical Course User Index [CS] Truddie Hidden, MD    Final Clinical Impression(s) / ED Diagnoses Final diagnoses:  None    Rx / DC Orders ED Discharge Orders     None        Truddie Hidden, MD 02/10/21 2145

## 2021-02-11 ENCOUNTER — Other Ambulatory Visit: Payer: Self-pay

## 2021-02-11 DIAGNOSIS — Z8679 Personal history of other diseases of the circulatory system: Secondary | ICD-10-CM

## 2021-02-11 DIAGNOSIS — E43 Unspecified severe protein-calorie malnutrition: Secondary | ICD-10-CM | POA: Insufficient documentation

## 2021-02-11 DIAGNOSIS — Z515 Encounter for palliative care: Secondary | ICD-10-CM

## 2021-02-11 DIAGNOSIS — F0391 Unspecified dementia with behavioral disturbance: Secondary | ICD-10-CM

## 2021-02-11 DIAGNOSIS — R627 Adult failure to thrive: Principal | ICD-10-CM

## 2021-02-11 DIAGNOSIS — I480 Paroxysmal atrial fibrillation: Secondary | ICD-10-CM

## 2021-02-11 LAB — COMPREHENSIVE METABOLIC PANEL
ALT: 9 U/L (ref 0–44)
AST: 12 U/L — ABNORMAL LOW (ref 15–41)
Albumin: 3.3 g/dL — ABNORMAL LOW (ref 3.5–5.0)
Alkaline Phosphatase: 73 U/L (ref 38–126)
Anion gap: 12 (ref 5–15)
BUN: 26 mg/dL — ABNORMAL HIGH (ref 8–23)
CO2: 24 mmol/L (ref 22–32)
Calcium: 9 mg/dL (ref 8.9–10.3)
Chloride: 111 mmol/L (ref 98–111)
Creatinine, Ser: 1.02 mg/dL (ref 0.61–1.24)
GFR, Estimated: >60 mL/min (ref >60)
Glucose, Bld: 76 mg/dL (ref 70–99)
Potassium: 3.7 mmol/L (ref 3.5–5.1)
Sodium: 147 mmol/L — ABNORMAL HIGH (ref 135–145)
Total Bilirubin: 1 mg/dL (ref 0.3–1.2)
Total Protein: 6.5 g/dL (ref 6.5–8.1)

## 2021-02-11 LAB — CBC
HCT: 36.1 % — ABNORMAL LOW (ref 39.0–52.0)
Hemoglobin: 11.9 g/dL — ABNORMAL LOW (ref 13.0–17.0)
MCH: 33.4 pg (ref 26.0–34.0)
MCHC: 33 g/dL (ref 30.0–36.0)
MCV: 101.4 fL — ABNORMAL HIGH (ref 80.0–100.0)
Platelets: 217 10*3/uL (ref 150–400)
RBC: 3.56 MIL/uL — ABNORMAL LOW (ref 4.22–5.81)
RDW: 13.3 % (ref 11.5–15.5)
WBC: 5.6 10*3/uL (ref 4.0–10.5)
nRBC: 0 % (ref 0.0–0.2)

## 2021-02-11 LAB — TSH: TSH: 3.393 u[IU]/mL (ref 0.350–4.500)

## 2021-02-11 LAB — SARS CORONAVIRUS 2 (TAT 6-24 HRS): SARS Coronavirus 2: NEGATIVE

## 2021-02-11 MED ORDER — DEXTROSE-NACL 5-0.45 % IV SOLN: INTRAVENOUS | Status: AC

## 2021-02-11 MED ORDER — FERROUS SULFATE 325 (65 FE) MG PO TABS
325.0000 mg | ORAL_TABLET | Freq: Two times a day (BID) | ORAL | Status: DC
  Administered 2021-02-11 – 2021-02-17 (×11): 325 mg via ORAL
  Filled 2021-02-11 (×13): qty 1

## 2021-02-11 MED ORDER — ENSURE ENLIVE PO LIQD
237.0000 mL | Freq: Three times a day (TID) | ORAL | Status: DC
  Administered 2021-02-11 – 2021-02-17 (×12): 237 mL via ORAL

## 2021-02-11 MED ORDER — ADULT MULTIVITAMIN W/MINERALS CH
1.0000 | ORAL_TABLET | Freq: Every day | ORAL | Status: DC
  Administered 2021-02-11 – 2021-02-17 (×7): 1 via ORAL
  Filled 2021-02-11 (×7): qty 1

## 2021-02-11 NOTE — Progress Notes (Signed)
Initial Nutrition Assessment  DOCUMENTATION CODES:   Severe malnutrition in context of chronic illness  INTERVENTION:  Recommend liberalize therapeutic restriction -regular diet  Recommend ST evaluation related to swallow function  Ensure Enlive po TID, each supplement provides 350 kcal and 20 grams of protein   Assist with feeding all meals and ONS   NUTRITION DIAGNOSIS:   Severe Malnutrition related to chronic illness (dementia) as evidenced by energy intake < 75% for > or equal to 1 month, severe muscle depletion, severe fat depletion, percent weight loss.   GOAL:  Patient will meet greater than or equal to 90% of their needs (if feasible given pt age and disease process)   MONITOR:  Supplement acceptance, PO intake, Labs, Skin, Weight trends  REASON FOR ASSESSMENT:   Malnutrition Screening Tool    ASSESSMENT: Patient is an underweight 87 yo male with hx of dementia, GERD, CAD, Failure to thrive in adult.Presents with poor po intake and generalized weakness.   Patient receiving heart healthy diet. No known intake since admission. Lunch tray is here 0% consumed. RD gave pt a chocolate Ensure Plus and he drank 75% (~ 250 kcal, 9 gr protein). Patient would likely benefit from cueing assistance with meals and ONS to maximize intake. Patient is edentulous. Question if there may be swallow difficulty. Patient clearing throat after drinking Ensure. Will provide soft foods given his limited dentition.   Patient has experienced significant wt loss over the past 14 months of 16 kg (24%) per chart review of weight encounters. Severe fat/muscle depletions multiple regions.   Medications reviewed and include: ferrous sulfate, Aricept, Protonix, Vit B-12, Vit D.   Labs: BMP Latest Ref Rng & Units 02/11/2021 02/10/2021 11/29/2019  Glucose 70 - 99 mg/dL 76 82 107(H)  BUN 8 - 23 mg/dL 26(H) 26(H) 20  Creatinine 0.61 - 1.24 mg/dL 1.02 1.07 1.07  BUN/Creat Ratio 10 - 24 - - -  Sodium 135 -  145 mmol/L 147(H) 145 145  Potassium 3.5 - 5.1 mmol/L 3.7 3.8 3.9  Chloride 98 - 111 mmol/L 111 116(H) 105  CO2 22 - 32 mmol/L 24 20(L) 29  Calcium 8.9 - 10.3 mg/dL 9.0 8.9 9.3      NUTRITION - FOCUSED PHYSICAL EXAM:  Flowsheet Row Most Recent Value  Orbital Region Moderate depletion  Upper Arm Region Severe depletion  Thoracic and Lumbar Region Severe depletion  Buccal Region Moderate depletion  Temple Region Moderate depletion  Clavicle Bone Region Severe depletion  Clavicle and Acromion Bone Region Severe depletion  Scapular Bone Region Severe depletion  Dorsal Hand Moderate depletion  Patellar Region Severe depletion  Anterior Thigh Region Severe depletion  Posterior Calf Region Severe depletion  Edema (RD Assessment) None  Hair Reviewed  [mustach and beard overgrown, disshoveled hair]  Eyes Reviewed  Mouth Reviewed  [endentulous]  Skin Reviewed  [extremely dry (arms and legs)]  Nails Reviewed  [dirty and poorly maintained]       Diet Order:   Diet Order             Diet Heart Room service appropriate? Yes; Fluid consistency: Thin  Diet effective now                   EDUCATION NEEDS:  Not appropriate for education at this time (due to cognitive impairment)  Skin:  Skin Assessment: Reviewed RN Assessment- extremely dry, flaky skin (bilateral arms and legs)  Last BM:  9/28  Height:   Ht Readings from Last 1  Encounters:  02/11/21 6\' 3"  (1.905 m)    Weight:   Wt Readings from Last 1 Encounters:  02/11/21 52 kg    Ideal Body Weight:   89 kg  BMI:  Body mass index is 14.33 kg/m.  Estimated Nutritional Needs:   Kcal:  1800-2000  Protein:  88-99 gr  Fluid:  >1400 ml daily   Colman Cater MS,RD,CSG,LDN Contact: Shea Evans

## 2021-02-11 NOTE — Progress Notes (Signed)
PROGRESS NOTE    Edward Trevino  VZC:[id number] DOB: [birth year] DOA: 02/10/2021 PCP: Edward Rio, MD   Chief Complaint  Patient presents with   Weakness    Brief admission narrative:  As per H&P written Edward Trevino on 01/21/21 Edward Trevino  is a 87 y.o. male, with history of anxiety, dementia, GERD with esophagitis, coronary artery disease, ischemic cardiomyopathy, major depression with history of suicide attempt, and more presents the ED with a chief complaint of decreased p.o. intake.  Unfortunately family has left at time of admission, and patient has dementia and is not able to provide any history.  He reports that he is not in any pain at this time.  He does know his name, but not the time, place, or context.  According to the ED staff, son brought patient in for generalized weakness, and decrease in p.o. intake over the last few days.  ED provider attempted to call son without answer.  Approximately 14 months ago patient was discharged from here after admission for generalized weakness and failure to thrive.  At that time nutritional supplements were started, and was advised to continue to encourage p.o. intake and adequate hydration.  Soft diet was also encouraged to facilitate chewing. Patient is discharged to [facility] was reportedly then transferred to [facility] rehab.  He was there for 9 months, and sent home with son 3 months ago.  In the last 3 months that is reported the patient has been declining, but worse so over the last few days.  In the ED today try to ambulate patient and he required max assist, and would not be safe to discharge home.  No further history could be obtained at this time.   In the ED Temp 97.8, heart rate 57-63, respiratory rate 12, blood pressure 135/67, satting 100% No leukocytosis, hemoglobin is 12.8, chemistry panels mostly unremarkable with a low normal glucose of 82 EKG shows a heart rate of 54, sinus bradycardia, QTC 435 Patient was brought in by EMS  they reported to the ED staff that patient was sent here for decrease in eating and drinking.  Assessment & Plan: 1-failure to thrive/severe protein calorie malnutrition  -Body mass index is 14.33 kg/m. -will follow recommendations by nutritional service -lack of eating and decrease oral intake in the setting of dementia. -palliative care consulted  -encourage PO intake  2-CAD/History of STEMI-Oct 2016- conservative Rx -No chest pain -Continue aspirin, statin and heart healthy diet. -Holding carvedilol in the setting of bradycardia.  3-P. AFib/Atrial fibrillation -sinus bradycardia currently -continue telemetry   4-Dementia with behavioral disturbance (HCC) -continue Seroquel and Aricept    DVT prophylaxis: Heparin Code Status: DNR Family Communication: No family at bedside.  Son contacted over the phone.  Disposition:   Status is: Inpatient  Remains inpatient appropriate because:Unsafe d/c plan  Dispo: The patient is from: Home              Anticipated d/c is to: To be determined              Patient currently is not medically stable to d/c.   Difficult to place patient No    Consultants:  Palliative care  Procedures:  See below for x-ray reports.  Antimicrobials:  None   Subjective: Afebrile, no chest pain, no nausea, no vomiting.  Patient reports no appetite and has not been eating much since admission.  Objective: Vitals:   02/10/21 2332 02/11/21 0407 02/11/21 0500 02/11/21 1437  BP: 140/75 128/70  129/72  Pulse: 60 66  65  Resp: 19 20  19   Temp: 97.7 F (36.5 C) 98.6 F (37 C)  98.6 F (37 C)  TempSrc: Oral Oral  Oral  SpO2: 97% 100%  97%  Weight:   52 kg   Height:   6\' 3"  (1.905 m)     Intake/Output Summary (Last 24 hours) at 02/11/2021 1727 Last data filed at 02/11/2021 1300 Gross per 24 hour  Intake 0 ml  Output --  Net 0 ml   Filed Weights   02/10/21 1713 02/11/21 0500  Weight: 57.2 kg 52 kg    Examination: General exam: Appears  calm and pleasantly confused. Reports decrease appetite.  Chronically ill in appearance and underweight. Respiratory system: Clear to auscultation. Respiratory effort normal. No requiring oxygen supplementation.  Cardiovascular system: S1 & S2 heard, RRR. No rubs, no gallops Gastrointestinal system: Abdomen is nondistended, soft and nontender. No organomegaly or masses felt. Normal bowel sounds heard. Central nervous system: Alert and oriented to person only; no focal deficits.  Extremities: No cyanosis or clubbing. Skin: No petechiae. Psychiatry: Judgement and insight appear impaired secondary to underlying dementia.  Data Reviewed: I have personally reviewed following labs and imaging studies  CBC: Recent Labs  Lab 02/10/21 1853 02/11/21 0458  WBC 6.5 5.6  NEUTROABS 4.7  --   HGB 12.8* 11.9*  HCT 39.1 36.1*  MCV 101.0* 101.4*  PLT 203 850    Basic Metabolic Panel: Recent Labs  Lab 02/10/21 1805 02/11/21 0458  NA 145 147*  K 3.8 3.7  CL 116* 111  CO2 20* 24  GLUCOSE 82 76  BUN 26* 26*  CREATININE 1.07 1.02  CALCIUM 8.9 9.0    GFR: Estimated Creatinine Clearance: 37.5 mL/min (by C-G formula based on SCr of 1.02 mg/dL).  Liver Function Tests: Recent Labs  Lab 02/10/21 1805 02/11/21 0458  AST 13* 12*  ALT 8 9  ALKPHOS 79 73  BILITOT 1.0 1.0  PROT 6.9 6.5  ALBUMIN 3.6 3.3*    CBG: No results for input(s): GLUCAP in the last 168 hours.   Recent Results (from the past 240 hour(s))  SARS CORONAVIRUS 2 (TAT 6-24 HRS) Nasopharyngeal Nasopharyngeal Swab     Status: None   Collection Time: 02/10/21  9:58 PM   Specimen: Nasopharyngeal Swab  Result Value Ref Range Status   SARS Coronavirus 2 NEGATIVE NEGATIVE Final    Comment: (NOTE) SARS-CoV-2 target nucleic acids are NOT DETECTED.  The SARS-CoV-2 RNA is generally detectable in upper and lower respiratory specimens during the acute phase of infection. Negative results do not preclude SARS-CoV-2 infection, do  not rule out co-infections with other pathogens, and should not be used as the sole basis for treatment or other patient management decisions. Negative results must be combined with clinical observations, patient history, and epidemiological information. The expected result is Negative.  Fact Sheet for Patients: SugarRoll.be  Fact Sheet for Healthcare Providers: https://www.woods-mathews.com/  This test is not yet approved or cleared by the [place] FDA and  has been authorized for detection and/or diagnosis of SARS-CoV-2 by FDA under an Emergency Use Authorization (EUA). This EUA will remain  in effect (meaning this test can be used) for the duration of the COVID-19 declaration under Se ction 564(b)(1) of the Act, 21 U.S.C. section 360bbb-3(b)(1), unless the authorization is terminated or revoked sooner.  Performed at [facility], [place] [address] [place], [place] [address]      Radiology Studies: No  results found.   Scheduled Meds:  aspirin EC  81 mg Oral Q breakfast   atorvastatin  80 mg Oral q1800   donepezil  10 mg Oral QHS   feeding supplement  237 mL Oral TID BM   ferrous sulfate  325 mg Oral BID WC   heparin  5,000 Units Subcutaneous Q8H   multivitamin with minerals  1 tablet Oral Daily   pantoprazole  40 mg Oral Daily   QUEtiapine  25 mg Oral Daily   cyanocobalamin  1,000 mcg Oral Daily   [START ON 02/14/2021] Vitamin D (Ergocalciferol)  50,000 Units Oral Q Sun   Continuous Infusions:  dextrose 5 % and 0.45% NaCl       LOS: 1 day    Time spent: 35 minutes   Barton Dubois, MD [facility]   To contact the attending provider between 7A-7P or the covering provider during after hours 7P-7A, please log into the web site [email or web address] and access using universal [place]  password for that web site. If you do not have the password, please call the hospital operator.  02/11/2021, 5:27 PM

## 2021-02-11 NOTE — Progress Notes (Signed)
Palliative: Thank you for this consult. Unfortunately due to high volume of consults there will be a delay in a Palliative Provider seeing this patient.  Palliative Medicine will return to service on 02/15/21 and will see patient at that time.  No charge Quinn Axe, NP Palliative Medicine Please call Palliative Medicine team phone with any questions [phone]. For individual providers please see AMION

## 2021-02-12 LAB — GLUCOSE, CAPILLARY: Glucose-Capillary: 124 mg/dL — ABNORMAL HIGH (ref 70–99)

## 2021-02-12 NOTE — TOC Initial Note (Signed)
Transition of Care [facility]) - Initial/Assessment Note    Patient Details  Name: Edward Trevino MRN: [id number] Date of Birth: Sep 25, 1931  [facility]) CM/SW Contact:    Shade Flood, LCSW Phone Number: 02/12/2021, 1:05 PM  Clinical Narrative:                  Pt admitted from home. Pt has been at home with his son for the last couple of months. Before that, pt was at the [facility] in [place]. Per MD, pt son indicated pt may need to return to that facility. Unable to reach pt's son after multiple attempts today. Based on son's conversation with MD, will refer pt back to the [place] and await determination.  [place] admissions team is not available any longer today. TOC was informed that referral can be faxed to [facility] at [phone] for follow up Monday. Amanda's phone number is [phone].  Referral faxed. Will follow up Monday. If pt/son desire a different dc plan, weekend TOC will be available to assist. Updated MD.  Pt's [place] admission notification completed. Reference number is [id number]. Expected Discharge Plan: Long Term Nursing Home Barriers to Discharge: No SNF bed   Patient Goals and CMS Choice Patient states their goals for this hospitalization and ongoing recovery are:: placement CMS Medicare.gov Compare Post Acute Care list provided to:: Patient Represenative (must comment) Choice offered to / list presented to : Adult Children  Expected Discharge Plan and Services Expected Discharge Plan: Long Term Nursing Home In-house Referral: Clinical Social Work   Post Acute Care Choice: Nursing Home Living arrangements for the past 2 months: [place]                                      Prior Living Arrangements/Services Living arrangements for the past 2 months: Single Family Home Lives with:: Adult Children Patient language and need for interpreter reviewed:: Yes Do you feel safe going back to the place  where you live?: Yes      Need for Family Participation in Patient Care: Yes (Comment) Care giver support system in place?: Yes (comment) Current home services: DME Criminal Activity/Legal Involvement Pertinent to Current Situation/Hospitalization: No - Comment as needed  Activities of Daily Living Home Assistive Devices/Equipment: Gilford Rile (specify type) ADL Screening (condition at time of admission) Patient's cognitive ability adequate to safely complete daily activities?: No Is the patient deaf or have difficulty hearing?: No Does the patient have difficulty seeing, even when wearing glasses/contacts?: No Does the patient have difficulty concentrating, remembering, or making decisions?: Yes Patient able to express need for assistance with ADLs?: Yes Does the patient have difficulty dressing or bathing?: No Independently performs ADLs?: Yes (appropriate for developmental age) Does the patient have difficulty walking or climbing stairs?: Yes Weakness of Legs: Both Weakness of Arms/Hands: Both  Permission Sought/Granted Permission sought to share information with : Chartered certified accountant granted to share information with : Yes, Verbal Permission Granted     Permission granted to share info w [facility]        Emotional Assessment       Orientation: : Oriented to Self, Oriented to Place, Oriented to Situation Alcohol / Substance Use: Not Applicable Psych Involvement: No (comment)  Admission diagnosis:  Failure to thrive in adult [R62.7] Patient Active Problem List   Diagnosis Date Noted   Protein-calorie malnutrition, severe  02/11/2021   Weakness    DNR (do not resuscitate)    Essential hypertension    Goals of care, counseling/discussion    Advanced care planning/counseling discussion    Palliative care by specialist    Failure to thrive in adult 11/27/2019   Dementia with behavioral disturbance ([place]) 11/21/2019   Gait abnormality 11/21/2019    Syncope and collapse 12/18/2018   Falls 12/17/2018   Constipation 06/11/2015   Rectal bleeding 06/11/2015   Bladder stones 05/22/2015   Acute duodenal ulcer with gastric outlet obstruction    Normocytic anemia 05/21/2015   Duodenal stricture    Abnormal serum level of alkaline phosphatase    Nausea and vomiting 05/20/2015   Esophageal stricture 05/20/2015   Dysphagia 05/20/2015   Depression 05/20/2015   Duodenal ulcer 05/20/2015   Acute esophagitis    Gastric outflow obstruction    Peptic ulcer    Gastric outlet obstruction 05/07/2015   Hematemesis with nausea    Loss of weight    CAD/History of STEMI-Oct 2016- conservative Rx 05/06/2015   Hyperlipidemia 05/06/2015   PAFib/Atrial fibrillation 05/06/2015   Orthostatic hypotension 05/05/2015   HFrEF/Cardiomyopathy, ischemic-EF 20% 01/77/9390   Chronic systolic CHF (congestive heart failure) ([place]) 05/05/2015   Chest pain with moderate risk of acute coronary syndrome 05/03/2015   Acute coronary syndrome ([place]) 02/28/2015   MDD (major depressive disorder), recurrent severe, without psychosis ([place]) 12/19/2012   Insomnia secondary to depression with anxiety 12/19/2012   Anxiety    Abnormal LFTs 10/01/2012   Gallstones 10/01/2012   Anemia 10/01/2012   Erosive esophagitis 07/25/2012   Gastric out let obstruction 07/25/2012   Choledocholithiasis with obstruction 07/25/2012    Class: History of   Obstructive jaundice 07/23/2012   PCP:  Leeanne Rio, MD Pharmacy:   [facility] [place] - [address] [place] [address] Phone: [phone] Fax: [place], [address] [place] [address] Phone: [phone] Fax: [phone]     Social Determinants of Health (SDOH) Interventions    Readmission Risk Interventions Readmission Risk Prevention Plan 02/12/2021   Transportation Screening Complete  Home Care Screening Complete  Medication Review (RN CM) Complete  Some recent data might be hidden

## 2021-02-12 NOTE — Evaluation (Signed)
Physical Therapy Evaluation Patient Details Name: Edward Trevino MRN: [id number] DOB: 02-04-1932 Today's Date: 02/12/2021  History of Present Illness  Edward Trevino  is a 87 y.o. male, with history of anxiety, dementia, GERD with esophagitis, coronary artery disease, ischemic cardiomyopathy, major depression with history of suicide attempt, and more presents the ED with a chief complaint of decreased p.o. intake.  Unfortunately family has left at time of admission, and patient has dementia and is not able to provide any history.  He reports that he is not in any pain at this time.  He does know his name, but not the time, place, or context.  According to the ED staff, son brought patient in for generalized weakness, and decrease in p.o. intake over the last few days.  ED provider attempted to call son without answer.  Approximately 14 months ago patient was discharged from here after admission for generalized weakness and failure to thrive.  At that time nutritional supplements were started, and was advised to continue to encourage p.o. intake and adequate hydration.  Soft diet was also encouraged to facilitate chewing. Patient is discharged to [facility] was reportedly then transferred to [facility] rehab.  He was there for 9 months, and sent home with son 3 months ago.  In the last 3 months that is reported the patient has been declining, but worse so over the last few days.  In the ED today try to ambulate patient and he required max assist, and would not be safe to discharge home.  No further history could be obtained at this time.   Clinical Impression  Patient is a poor historian and is unable to provide information on home set up/PLOF. Patient limited for functional mobility as stated below secondary to BLE weakness, fatigue and poor balance. Patient requires minimal assist for LE movement with cueing but does require max assist to pull to seated secondary to weakness. Patient will benefit from continued  physical therapy in hospital and recommended venue below to increase strength, balance, endurance for safe ADLs and gait.        Recommendations for follow up therapy are one component of a multi-disciplinary discharge planning process, led by the attending physician.  Recommendations may be updated based on patient status, additional functional criteria and insurance authorization.  Follow Up Recommendations SNF;Supervision/Assistance - 24 hour    Equipment Recommendations  None recommended by PT    Recommendations for Other Services       Precautions / Restrictions Precautions Precautions: Fall Restrictions Weight Bearing Restrictions: No      Mobility  Bed Mobility Overal bed mobility: Needs Assistance Bed Mobility: Supine to Sit     Supine to sit: Max assist;HOB elevated     General bed mobility comments: requires max assist to pull to seated and min A for LE movement    Transfers                    Ambulation/Gait                Stairs            Wheelchair Mobility    Modified Rankin (Stroke Patients Only)       Balance                                             Pertinent Vitals/Pain  Pain Assessment: No/denies pain    Home Living Family/patient expects to be discharged to:: Private residence                 Additional Comments: Patient is a poor historian and is unable to provide info on home set up/PLOF    Prior Function           Comments: Patient is a poor historian and is unable to provide info on home set up/PLOF     Hand Dominance        Extremity/Trunk Assessment   Upper Extremity Assessment Upper Extremity Assessment: Generalized weakness    Lower Extremity Assessment Lower Extremity Assessment: Generalized weakness       Communication   Communication: No difficulties  Cognition Arousal/Alertness: Awake/alert Behavior During Therapy: WFL for tasks  assessed/performed Overall Cognitive Status: No family/caregiver present to determine baseline cognitive functioning                                        General Comments      Exercises     Assessment/Plan    PT Assessment Patient needs continued PT services  PT Problem List Decreased strength;Decreased mobility;Decreased activity tolerance;Decreased balance;Decreased cognition       PT Treatment Interventions DME instruction;Therapeutic exercise;Gait training;Balance training;Stair training;Neuromuscular re-education;Functional mobility training;Therapeutic activities;Patient/family education    PT Goals (Current goals can be found in the Care Plan section)  Acute Rehab PT Goals Patient Stated Goal: none stated PT Goal Formulation: With patient Time For Goal Achievement: 02/26/21 Potential to Achieve Goals: Fair    Frequency Min 3X/week   Barriers to discharge        Co-evaluation               AM-PAC PT "6 Clicks" Mobility  Outcome Measure Help needed turning from your back to your side while in a flat bed without using bedrails?: A Little Help needed moving from lying on your back to sitting on the side of a flat bed without using bedrails?: A Lot Help needed moving to and from a bed to a chair (including a wheelchair)?: A Lot Help needed standing up from a chair using your arms (e.g., wheelchair or bedside chair)?: A Lot Help needed to walk in hospital room?: A Lot Help needed climbing 3-5 steps with a railing? : Total 6 Click Score: 12    End of Session   Activity Tolerance: Patient limited by fatigue Patient left: in bed;with call bell/phone within reach;with bed alarm set Nurse Communication: Mobility status PT Visit Diagnosis: Unsteadiness on feet (R26.81);Other abnormalities of gait and mobility (R26.89);Muscle weakness (generalized) (M62.81)    Time: 0258-5277 PT Time Calculation (min) (ACUTE ONLY): 7 min   Charges:   PT  Evaluation $PT Eval Low Complexity: 1 Low          9:31 AM, 02/12/21 Mearl Latin PT, DPT Physical Therapist at [facility]

## 2021-02-12 NOTE — Progress Notes (Signed)
PROGRESS NOTE    Edward Trevino  VZC:[id number] DOB: [birth year] DOA: 02/10/2021 PCP: Leeanne Rio, MD   Chief Complaint  Patient presents with   Weakness    Brief admission narrative:  As per H&P written [name] on 01/21/21 Edward Trevino  is a 87 y.o. male, with history of anxiety, dementia, GERD with esophagitis, coronary artery disease, ischemic cardiomyopathy, major depression with history of suicide attempt, and more presents the ED with a chief complaint of decreased p.o. intake.  Unfortunately family has left at time of admission, and patient has dementia and is not able to provide any history.  He reports that he is not in any pain at this time.  He does know his name, but not the time, place, or context.  According to the ED staff, son brought patient in for generalized weakness, and decrease in p.o. intake over the last few days.  ED provider attempted to call son without answer.  Approximately 14 months ago patient was discharged from here after admission for generalized weakness and failure to thrive.  At that time nutritional supplements were started, and was advised to continue to encourage p.o. intake and adequate hydration.  Soft diet was also encouraged to facilitate chewing. Patient is discharged to [facility] was reportedly then transferred to [facility] rehab.  He was there for 9 months, and sent home with son 3 months ago.  In the last 3 months that is reported the patient has been declining, but worse so over the last few days.  In the ED today try to ambulate patient and he required max assist, and would not be safe to discharge home.  No further history could be obtained at this time.   In the ED Temp 97.8, heart rate 57-63, respiratory rate 12, blood pressure 135/67, satting 100% No leukocytosis, hemoglobin is 12.8, chemistry panels mostly unremarkable with a low normal glucose of 82 EKG shows a heart rate of 54, sinus bradycardia, QTC 435 Patient was brought in by EMS  they reported to the ED staff that patient was sent here for decrease in eating and drinking.  Assessment & Plan: 1-failure to thrive/severe protein calorie malnutrition  -Body mass index is 14.33 kg/m. -will follow recommendations by nutritional service -lack of eating and decrease oral intake in the setting of dementia. -Patient has been seen by physical therapy with recommendations for supervision/assistance 24 hours and a skilled nursing facility.  Patient with limited functional mobility secondary to bilateral lower extremity weakness, fatigue, poor balance and deconditioning.  Requiring maximum assistance. -palliative care consulted; will follow recommendations. -Continue to encourage PO intake  2-CAD/History of STEMI-Oct 2016- conservative Rx -No chest pain -Continue aspirin, statin and heart healthy diet. -Continue holding carvedilol in the setting of bradycardia.  3-P. AFib/Atrial fibrillation -sinus bradycardia currently -continue telemetry   4-Dementia with behavioral disturbance (HCC) -continue Seroquel and Aricept    DVT prophylaxis: Heparin Code Status: DNR Family Communication: No family at bedside.  Son contacted and updated over the phone 02/12/21  Disposition:   Status is: Inpatient  Remains inpatient appropriate because:Unsafe d/c plan  Dispo: The patient is from: Home              Anticipated d/c is to: To be determined              Patient currently is not medically stable to d/c.   Difficult to place patient No    Consultants:  Palliative care  Procedures:  See below for x-ray  reports.  Antimicrobials:  None   Subjective: No fever, no chest pain, no nausea or vomiting.  Patient with decreased appetite and decreased oral intake.  Oriented to person only.  Objective: Vitals:   02/11/21 1437 02/11/21 2104 02/12/21 0535 02/12/21 1245  BP: 129/72 (!) 111/51 108/66 (!) 128/58  Pulse: 65 63 71 (!) 39  Resp: 19 17 15 17   Temp: 98.6 F (37 C)  97.8 F (36.6 C) 97.7 F (36.5 C) 97.6 F (36.4 C)  TempSrc: Oral Oral Oral Oral  SpO2: 97% 100% 100% 90%  Weight:      Height:        Intake/Output Summary (Last 24 hours) at 02/12/2021 1822 Last data filed at 02/12/2021 1300 Gross per 24 hour  Intake 725 ml  Output --  Net 725 ml   Filed Weights   02/10/21 1713 02/11/21 0500  Weight: 57.2 kg 52 kg    Examination: General exam: Afebrile, no chest pain, no nausea, no vomiting.  Continues to have poor appetite and decreased oral intake.  Chronically ill in appearance and underweight. Respiratory system: Clear to auscultation. Respiratory effort normal.  No requiring oxygen supplementation.  Good saturation on room air. Cardiovascular system:RRR. No murmurs, rubs, gallops.  No JVD. Gastrointestinal system: Abdomen is nondistended, soft and nontender. No organomegaly or masses felt. Normal bowel sounds heard. Central nervous system: No focal neurologic deficits. Extremities: No cyanosis or clubbing. Skin: No rashes, lesions or ulcers Psychiatry: Judgement and insight appear impaired secondary to dementia.   Data Reviewed: I have personally reviewed following labs and imaging studies  CBC: Recent Labs  Lab 02/10/21 1853 02/11/21 0458  WBC 6.5 5.6  NEUTROABS 4.7  --   HGB 12.8* 11.9*  HCT 39.1 36.1*  MCV 101.0* 101.4*  PLT 203 007    Basic Metabolic Panel: Recent Labs  Lab 02/10/21 1805 02/11/21 0458  NA 145 147*  K 3.8 3.7  CL 116* 111  CO2 20* 24  GLUCOSE 82 76  BUN 26* 26*  CREATININE 1.07 1.02  CALCIUM 8.9 9.0    GFR: Estimated Creatinine Clearance: 37.5 mL/min (by C-G formula based on SCr of 1.02 mg/dL).  Liver Function Tests: Recent Labs  Lab 02/10/21 1805 02/11/21 0458  AST 13* 12*  ALT 8 9  ALKPHOS 79 73  BILITOT 1.0 1.0  PROT 6.9 6.5  ALBUMIN 3.6 3.3*    CBG: Recent Labs  Lab 02/12/21 0051  GLUCAP 124*     Recent Results (from the past 240 hour(s))  SARS CORONAVIRUS 2 (TAT 6-24  HRS) Nasopharyngeal Nasopharyngeal Swab     Status: None   Collection Time: 02/10/21  9:58 PM   Specimen: Nasopharyngeal Swab  Result Value Ref Range Status   SARS Coronavirus 2 NEGATIVE NEGATIVE Final    Comment: (NOTE) SARS-CoV-2 target nucleic acids are NOT DETECTED.  The SARS-CoV-2 RNA is generally detectable in upper and lower respiratory specimens during the acute phase of infection. Negative results do not preclude SARS-CoV-2 infection, do not rule out co-infections with other pathogens, and should not be used as the sole basis for treatment or other patient management decisions. Negative results must be combined with clinical observations, patient history, and epidemiological information. The expected result is Negative.  Fact Sheet for Patients: SugarRoll.be  Fact Sheet for Healthcare Providers: https://www.woods-mathews.com/  This test is not yet approved or cleared by the [place] FDA and  has been authorized for detection and/or diagnosis of SARS-CoV-2 by FDA under an Emergency Use  Authorization (EUA). This EUA will remain  in effect (meaning this test can be used) for the duration of the COVID-19 declaration under Se ction 564(b)(1) of the Act, 21 U.S.C. section 360bbb-3(b)(1), unless the authorization is terminated or revoked sooner.  Performed at [facility], [place] [address], [place], [place] [address]      Radiology Studies: No results found.   Scheduled Meds:  aspirin EC  81 mg Oral Q breakfast   atorvastatin  80 mg Oral q1800   donepezil  10 mg Oral QHS   feeding supplement  237 mL Oral TID BM   ferrous sulfate  325 mg Oral BID WC   heparin  5,000 Units Subcutaneous Q8H   multivitamin with minerals  1 tablet Oral Daily   pantoprazole  40 mg Oral Daily   QUEtiapine  25 mg Oral Daily   cyanocobalamin  1,000 mcg Oral Daily   [START ON 02/14/2021] Vitamin D (Ergocalciferol)  50,000 Units Oral Q Sun    Continuous Infusions:     LOS: 2 days    Time spent: 35 minutes   Barton Dubois, MD [facility]   To contact the attending provider between 7A-7P or the covering provider during after hours 7P-7A, please log into the web site [email or web address] and access using universal [place] password for that web site. If you do not have the password, please call the hospital operator.  02/12/2021, 6:22 PM

## 2021-02-12 NOTE — Plan of Care (Signed)
  Problem: Acute Rehab PT Goals(only PT should resolve) Goal: Pt Will Go Supine/Side To Sit Outcome: Progressing Flowsheets (Taken 02/12/2021 0932) Pt will go Supine/Side to Sit:  with minimal assist  with moderate assist Goal: Pt Will Go Sit To Supine/Side Outcome: Progressing Flowsheets (Taken 02/12/2021 0932) Pt will go Sit to Supine/Side:  with minimal assist  with moderate assist Goal: Patient Will Transfer Sit To/From Stand Outcome: Progressing Flowsheets (Taken 02/12/2021 0932) Patient will transfer sit to/from stand:  with minimal assist  with moderate assist Goal: Pt Will Transfer Bed To Chair/Chair To Bed Outcome: Progressing Flowsheets (Taken 02/12/2021 0932) Pt will Transfer Bed to Chair/Chair to Bed:  with min assist  with mod assist Goal: Pt/caregiver will Perform Home Exercise Program Outcome: Progressing Flowsheets (Taken 02/12/2021 0932) Pt/caregiver will Perform Home Exercise Program:  For increased strengthening  For improved balance  With Supervision, verbal cues required/provided  9:33 AM, 02/12/21 Mearl Latin PT, DPT Physical Therapist at [facility]

## 2021-02-13 DIAGNOSIS — E43 Unspecified severe protein-calorie malnutrition: Secondary | ICD-10-CM

## 2021-02-13 DIAGNOSIS — I480 Paroxysmal atrial fibrillation: Secondary | ICD-10-CM

## 2021-02-13 NOTE — Progress Notes (Signed)
Pt refuses orthostatic BP

## 2021-02-13 NOTE — Progress Notes (Signed)
PROGRESS NOTE    Edward Trevino  DVV:[id number] DOB: 11-21-1931 DOA: 02/10/2021 PCP: Leeanne Rio, MD   Chief Complaint  Patient presents with   Weakness   Brief admission narrative:  As per H&P written Edward Trevino on 01/21/21 Edward Trevino  is a 87 y.o. male, with history of anxiety, dementia, GERD with esophagitis, coronary artery disease, ischemic cardiomyopathy, major depression with history of suicide attempt, and more presents the ED with a chief complaint of decreased p.o. intake.  Unfortunately family has left at time of admission, and patient has dementia and is not able to provide any history.  He reports that he is not in any pain at this time.  He does know his name, but not the time, place, or context.  According to the ED staff, son brought patient in for generalized weakness, and decrease in p.o. intake over the last few days.  ED provider attempted to call son without answer.  Approximately 14 months ago patient was discharged from here after admission for generalized weakness and failure to thrive.  At that time nutritional supplements were started, and was advised to continue to encourage p.o. intake and adequate hydration.  Soft diet was also encouraged to facilitate chewing. Patient is discharged to [facility] was reportedly then transferred to [facility] rehab.  He was there for 9 months, and sent home with son 3 months ago.  In the last 3 months that is reported the patient has been declining, but worse so over the last few days.  In the ED today try to ambulate patient and he required max assist, and would not be safe to discharge home.  No further history could be obtained at this time.   In the ED Temp 97.8, heart rate 57-63, respiratory rate 12, blood pressure 135/67, satting 100% No leukocytosis, hemoglobin is 12.8, chemistry panels mostly unremarkable with a low normal glucose of 82 EKG shows a heart rate of 54, sinus bradycardia, QTC 435 Patient was brought in by EMS  they reported to the ED staff that patient was sent here for decrease in eating and drinking.  Assessment & Plan: 1-failure to thrive/severe protein calorie malnutrition  -Body mass index is 14.33 kg/m. -will continue to follow recommendations by nutritional service -Patient continues demonstrating lack of eating and decrease oral intake in the setting of dementia. -Patient has been seen by physical therapy with recommendations for supervision/assistance 24 hours and  skilled nursing facility placement.  Patient with limited functional mobility secondary to bilateral lower extremity weakness, fatigue, poor balance and deconditioning.  He is requiring maximum assistance; unsafe discharge to home currently. -palliative care consulted; will follow recommendations. -Continue to encourage PO intake  2-CAD/History of STEMI-Oct 2016- conservative Rx -No chest pain -Continue aspirin, statin and heart healthy diet. -Continue holding carvedilol in the setting of bradycardia.  3-P. AFib/Atrial fibrillation -sinus bradycardia currently -Intermittent PVCs appreciated -Continue to follow electrolytes -In the setting of DNR status we will discontinue telemetry.  4-Dementia with behavioral disturbance (Edward Trevino) -continue Seroquel and Aricept    DVT prophylaxis: Heparin Code Status: DNR Family Communication: No family at bedside.  Son contacted and updated over the phone 02/12/21; unable to reach in 02/13/2021.  Disposition:   Status is: Inpatient  Remains inpatient appropriate because:Unsafe d/c plan  Dispo: The patient is from: Home              Anticipated d/c is to: To be determined  Patient currently is not medically stable to d/c.   Difficult to place patient No    Consultants:  Palliative care  Procedures:  See below for x-ray reports.  Antimicrobials:  None   Subjective: No fever, no chest pain, no nausea, no vomiting.  Chronically ill in appearance, decreased oral  intake, deconditioned and frail.  Objective: Vitals:   02/12/21 1245 02/12/21 2109 02/13/21 0446 02/13/21 0610  BP: (!) 128/58 (!) 125/113 125/69 118/68  Pulse: (!) 39 68 68 60  Resp: 17 16 16 16   Temp: 97.6 F (36.4 C) 97.8 F (36.6 C) 97.6 F (36.4 C)   TempSrc: Oral Oral Oral   SpO2: 90% 96% 100% 99%  Weight:      Height:        Intake/Output Summary (Last 24 hours) at 02/13/2021 1227 Last data filed at 02/13/2021 0500 Gross per 24 hour  Intake 240 ml  Output 600 ml  Net -360 ml   Filed Weights   02/10/21 1713 02/11/21 0500  Weight: 57.2 kg 52 kg    Examination: General exam: No major distress appreciated; afebrile, no nausea or vomiting.  Denies chest pain.  Oriented x1 only.  Pleasantly confused Respiratory system: Clear to auscultation. Respiratory effort normal.  No requiring oxygen supplementation. Cardiovascular system: Sinus bradycardia, no rubs, no gallops, no JVD. Gastrointestinal system: Abdomen is nondistended, soft and nontender. No organomegaly or masses felt. Normal bowel sounds heard. Central nervous system: No focal neurologic deficit. Extremities: No cyanosis or clubbing. Skin: No petechiae. Psychiatry: Judgement and insight appear impaired secondary to dementia.  Data Reviewed: I have personally reviewed following labs and imaging studies  CBC: Recent Labs  Lab 02/10/21 1853 02/11/21 0458  WBC 6.5 5.6  NEUTROABS 4.7  --   HGB 12.8* 11.9*  HCT 39.1 36.1*  MCV 101.0* 101.4*  PLT 203 433    Basic Metabolic Panel: Recent Labs  Lab 02/10/21 1805 02/11/21 0458  NA 145 147*  K 3.8 3.7  CL 116* 111  CO2 20* 24  GLUCOSE 82 76  BUN 26* 26*  CREATININE 1.07 1.02  CALCIUM 8.9 9.0    GFR: Estimated Creatinine Clearance: 37.5 mL/min (by C-G formula based on SCr of 1.02 mg/dL).  Liver Function Tests: Recent Labs  Lab 02/10/21 1805 02/11/21 0458  AST 13* 12*  ALT 8 9  ALKPHOS 79 73  BILITOT 1.0 1.0  PROT 6.9 6.5  ALBUMIN 3.6 3.3*     CBG: Recent Labs  Lab 02/12/21 0051  GLUCAP 124*    Recent Results (from the past 240 hour(s))  SARS CORONAVIRUS 2 (TAT 6-24 HRS) Nasopharyngeal Nasopharyngeal Swab     Status: None   Collection Time: 02/10/21  9:58 PM   Specimen: Nasopharyngeal Swab  Result Value Ref Range Status   SARS Coronavirus 2 NEGATIVE NEGATIVE Final    Comment: (NOTE) SARS-CoV-2 target nucleic acids are NOT DETECTED.  The SARS-CoV-2 RNA is generally detectable in upper and lower respiratory specimens during the acute phase of infection. Negative results do not preclude SARS-CoV-2 infection, do not rule out co-infections with other pathogens, and should not be used as the sole basis for treatment or other patient management decisions. Negative results must be combined with clinical observations, patient history, and epidemiological information. The expected result is Negative.  Fact Sheet for Patients: SugarRoll.be  Fact Sheet for Healthcare Providers: https://www.woods-mathews.com/  This test is not yet approved or cleared by the [place] FDA and  has been authorized for detection and/or  diagnosis of SARS-CoV-2 by FDA under an Emergency Use Authorization (EUA). This EUA will remain  in effect (meaning this test can be used) for the duration of the COVID-19 declaration under Se ction 564(b)(1) of the Act, 21 U.S.C. section 360bbb-3(b)(1), unless the authorization is terminated or revoked sooner.  Performed at [facility], [place] [address], [place], [place] [address]      Radiology Studies: No results found.   Scheduled Meds:  aspirin EC  81 mg Oral Q breakfast   atorvastatin  80 mg Oral q1800   donepezil  10 mg Oral QHS   feeding supplement  237 mL Oral TID BM   ferrous sulfate  325 mg Oral BID WC   heparin  5,000 Units Subcutaneous Q8H   multivitamin with minerals  1 tablet Oral Daily   pantoprazole  40 mg Oral Daily   QUEtiapine   25 mg Oral Daily   cyanocobalamin  1,000 mcg Oral Daily   [START ON 02/14/2021] Vitamin D (Ergocalciferol)  50,000 Units Oral Q Sun   Continuous Infusions:    LOS: 3 days    Time spent: 30 minutes   Barton Dubois, MD [facility]   To contact the attending provider between 7A-7P or the covering provider during after hours 7P-7A, please log into the web site [email or web address] and access using universal [place] password for that web site. If you do not have the password, please call the hospital operator.  02/13/2021, 12:27 PM

## 2021-02-14 LAB — CBC
HCT: 35 % — ABNORMAL LOW (ref 39.0–52.0)
Hemoglobin: 11.8 g/dL — ABNORMAL LOW (ref 13.0–17.0)
MCH: 33.7 pg (ref 26.0–34.0)
MCHC: 33.7 g/dL (ref 30.0–36.0)
MCV: 100 fL (ref 80.0–100.0)
Platelets: 215 10*3/uL (ref 150–400)
RBC: 3.5 MIL/uL — ABNORMAL LOW (ref 4.22–5.81)
RDW: 13.3 % (ref 11.5–15.5)
WBC: 6.3 10*3/uL (ref 4.0–10.5)
nRBC: 0 % (ref 0.0–0.2)

## 2021-02-14 LAB — BASIC METABOLIC PANEL
Anion gap: 5 (ref 5–15)
BUN: 32 mg/dL — ABNORMAL HIGH (ref 8–23)
CO2: 30 mmol/L (ref 22–32)
Calcium: 8.6 mg/dL — ABNORMAL LOW (ref 8.9–10.3)
Chloride: 108 mmol/L (ref 98–111)
Creatinine, Ser: 1.06 mg/dL (ref 0.61–1.24)
GFR, Estimated: >60 mL/min (ref >60)
Glucose, Bld: 97 mg/dL (ref 70–99)
Potassium: 3.6 mmol/L (ref 3.5–5.1)
Sodium: 143 mmol/L (ref 135–145)

## 2021-02-14 LAB — MAGNESIUM: Magnesium: 2.2 mg/dL (ref 1.7–2.4)

## 2021-02-14 NOTE — TOC Progression Note (Signed)
Transition of Care [facility]) - Progression Note    Patient Details  Name: Edward Trevino MRN: [id number] Date of Birth: [birth year]  Transition of [facility]) CM/SW [place], [place] Phone Number: 02/14/2021, 12:48 PM  Clinical Narrative:    CSW attempted call to pts son, there was no answer and no VM box set up. TOC to follow up with [facility] Monday when admission is in office and attempt to contact pts son again. TOC to follow.   Expected Discharge Plan: Long Term Nursing Home Barriers to Discharge: No SNF bed  Expected Discharge Plan and Services Expected Discharge Plan: Long Term Nursing Home In-house Referral: Clinical Social Work   Post Acute Care Choice: Nursing Home Living arrangements for the past 2 months: Single Family Home                                       Social Determinants of Health (SDOH) Interventions    Readmission Risk Interventions Readmission Risk Prevention Plan 02/12/2021  Transportation Screening Complete  Home Care Screening Complete  Medication Review (RN CM) Complete  Some recent data might be hidden

## 2021-02-14 NOTE — Plan of Care (Signed)
  Problem: Education: Goal: Knowledge of General Education information will improve Description Including pain rating scale, medication(s)/side effects and non-pharmacologic comfort measures Outcome: Progressing   Problem: Health Behavior/Discharge Planning: Goal: Ability to manage health-related needs will improve Outcome: Progressing   

## 2021-02-14 NOTE — Progress Notes (Signed)
Tried to obtain patient's Orthostatic vitals but he would not sit on the side of the bed. He kicked the NT and I and tried to hit [place] with the walker. Will try again later in the night.

## 2021-02-14 NOTE — Progress Notes (Signed)
PROGRESS NOTE    Edward Trevino  EBR:[id number] DOB: August 10, 1931 DOA: 02/10/2021 PCP: Leeanne Rio, MD   Chief Complaint  Patient presents with   Weakness   Brief admission narrative:  As per H&P written [name] on 01/21/21 Edward Trevino  is a 87 y.o. male, with history of anxiety, dementia, GERD with esophagitis, coronary artery disease, ischemic cardiomyopathy, major depression with history of suicide attempt, and more presents the ED with a chief complaint of decreased p.o. intake.  Unfortunately family has left at time of admission, and patient has dementia and is not able to provide any history.  He reports that he is not in any pain at this time.  He does know his name, but not the time, place, or context.  According to the ED staff, son brought patient in for generalized weakness, and decrease in p.o. intake over the last few days.  ED provider attempted to call son without answer.  Approximately 14 months ago patient was discharged from here after admission for generalized weakness and failure to thrive.  At that time nutritional supplements were started, and was advised to continue to encourage p.o. intake and adequate hydration.  Soft diet was also encouraged to facilitate chewing. Patient is discharged to [facility] was reportedly then transferred to [facility] rehab.  He was there for 9 months, and sent home with son 3 months ago.  In the last 3 months that is reported the patient has been declining, but worse so over the last few days.  In the ED today try to ambulate patient and he required max assist, and would not be safe to discharge home.  No further history could be obtained at this time.   In the ED Temp 97.8, heart rate 57-63, respiratory rate 12, blood pressure 135/67, satting 100% No leukocytosis, hemoglobin is 12.8, chemistry panels mostly unremarkable with a low normal glucose of 82 EKG shows a heart rate of 54, sinus bradycardia, QTC 435 Patient was brought in by EMS  they reported to the ED staff that patient was sent here for decrease in eating and drinking.  Assessment & Plan: 1-failure to thrive/severe protein calorie malnutrition  -Body mass index is 14.33 kg/m. -will continue to follow recommendations by nutritional service -Patient continues demonstrating lack of eating and decrease oral intake in the setting of dementia. -Patient has been seen by physical therapy with recommendations for supervision/assistance 24 hours and  skilled nursing facility placement.  Patient with limited functional mobility secondary to bilateral lower extremity weakness, fatigue, poor balance and deconditioning.  He is requiring maximum assistance; unsafe discharge to home currently. -palliative care consulted; will follow recommendations. -Continue to encourage PO intake -Palliative care and hospice placement discussed with son.  2-CAD/History of STEMI-Oct 2016- conservative Rx -No chest pain -Continue aspirin, statin and heart healthy diet. -Continue holding carvedilol in the setting of bradycardia.  3-P. AFib/Atrial fibrillation -sinus bradycardia currently -Intermittent PVCs appreciated -Continue to follow electrolytes -In the setting of DNR status we will discontinue telemetry.  4-Dementia with behavioral disturbance ([place]) -continue Seroquel and Aricept    DVT prophylaxis: Heparin Code Status: DNR Family Communication: No family at bedside.  Son contacted nursing staff reporting that he will be in the hospital on 02/15/2021; and at this moment is interested to proceed with hospice placement.  Disposition:   Status is: Inpatient  Remains inpatient appropriate because:Unsafe d/c plan  Dispo: The patient is from: Home  Anticipated d/c is to: To be determined              Patient currently is not medically stable to d/c.   Difficult to place patient No    Consultants:  Palliative care  Procedures:  See below for x-ray  reports.  Antimicrobials:  None   Subjective: Afebrile, no chest pain, no nausea, no vomiting.  Chronically ill and deconditioned.  Continues to demonstrate poor oral intake.  Objective: Vitals:   02/13/21 1300 02/13/21 2004 02/14/21 0525 02/14/21 1354  BP: 99/61 (!) 97/52 118/66 (!) 92/49  Pulse: 85 90 73 61  Resp: 17 16 18    Temp: 98 F (36.7 C) 97.8 F (36.6 C) 98.2 F (36.8 C) 98.1 F (36.7 C)  TempSrc: Oral Oral  Oral  SpO2: 100% 95% 100% 98%  Weight:      Height:        Intake/Output Summary (Last 24 hours) at 02/14/2021 1531 Last data filed at 02/14/2021 1300 Gross per 24 hour  Intake 1040 ml  Output 1200 ml  Net -160 ml   Filed Weights   02/10/21 1713 02/11/21 0500  Weight: 57.2 kg 52 kg    Examination: General exam: Oriented x1, pleasantly confused, poor insight.  No chest pain, no nausea, no vomiting.  Poor oral intake.  Chronically ill in appearance. Respiratory system: Clear to auscultation. Respiratory effort normal.  No requiring oxygen supplementation. Cardiovascular system:RRR. No murmurs, rubs, gallops. Gastrointestinal system: Abdomen is nondistended, soft and nontender. No organomegaly or masses felt. Normal bowel sounds heard. Central nervous system: No focal neurological deficits. Extremities: No cyanosis or clubbing. Skin: No petechiae. Psychiatry: Judgement and insight appear impaired secondary to dementia.  Data Reviewed: I have personally reviewed following labs and imaging studies  CBC: Recent Labs  Lab 02/10/21 1853 02/11/21 0458 02/14/21 0509  WBC 6.5 5.6 6.3  NEUTROABS 4.7  --   --   HGB 12.8* 11.9* 11.8*  HCT 39.1 36.1* 35.0*  MCV 101.0* 101.4* 100.0  PLT 203 217 196    Basic Metabolic Panel: Recent Labs  Lab 02/10/21 1805 02/11/21 0458 02/14/21 0509  NA 145 147* 143  K 3.8 3.7 3.6  CL 116* 111 108  CO2 20* 24 30  GLUCOSE 82 76 97  BUN 26* 26* 32*  CREATININE 1.07 1.02 1.06  CALCIUM 8.9 9.0 8.6*  MG  --   --   2.2    GFR: Estimated Creatinine Clearance: 36.1 mL/min (by C-G formula based on SCr of 1.06 mg/dL).  Liver Function Tests: Recent Labs  Lab 02/10/21 1805 02/11/21 0458  AST 13* 12*  ALT 8 9  ALKPHOS 79 73  BILITOT 1.0 1.0  PROT 6.9 6.5  ALBUMIN 3.6 3.3*    CBG: Recent Labs  Lab 02/12/21 0051  GLUCAP 124*    Recent Results (from the past 240 hour(s))  SARS CORONAVIRUS 2 (TAT 6-24 HRS) Nasopharyngeal Nasopharyngeal Swab     Status: None   Collection Time: 02/10/21  9:58 PM   Specimen: Nasopharyngeal Swab  Result Value Ref Range Status   SARS Coronavirus 2 NEGATIVE NEGATIVE Final    Comment: (NOTE) SARS-CoV-2 target nucleic acids are NOT DETECTED.  The SARS-CoV-2 RNA is generally detectable in upper and lower respiratory specimens during the acute phase of infection. Negative results do not preclude SARS-CoV-2 infection, do not rule out co-infections with other pathogens, and should not be used as the sole basis for treatment or other patient management decisions. Negative results must  be combined with clinical observations, patient history, and epidemiological information. The expected result is Negative.  Fact Sheet for Patients: SugarRoll.be  Fact Sheet for Healthcare Providers: https://www.woods-mathews.com/  This test is not yet approved or cleared by the [place] FDA and  has been authorized for detection and/or diagnosis of SARS-CoV-2 by FDA under an Emergency Use Authorization (EUA). This EUA will remain  in effect (meaning this test can be used) for the duration of the COVID-19 declaration under Se ction 564(b)(1) of the Act, 21 U.S.C. section 360bbb-3(b)(1), unless the authorization is terminated or revoked sooner.  Performed at [facility], [place] [address], [place], [place] [address]      Radiology Studies: No results found.   Scheduled Meds:  aspirin EC  81 mg Oral Q breakfast    atorvastatin  80 mg Oral q1800   donepezil  10 mg Oral QHS   feeding supplement  237 mL Oral TID BM   ferrous sulfate  325 mg Oral BID WC   heparin  5,000 Units Subcutaneous Q8H   multivitamin with minerals  1 tablet Oral Daily   pantoprazole  40 mg Oral Daily   QUEtiapine  25 mg Oral Daily   cyanocobalamin  1,000 mcg Oral Daily   Vitamin D (Ergocalciferol)  50,000 Units Oral Q Sun   Continuous Infusions:    LOS: 4 days    Time spent: 30 minutes   Barton Dubois, MD [facility]   To contact the attending provider between 7A-7P or the covering provider during after hours 7P-7A, please log into the web site [email or web address] and access using universal [place] password for that web site. If you do not have the password, please call the hospital operator.  02/14/2021, 3:31 PM

## 2021-02-14 NOTE — Progress Notes (Signed)
Orthostatics obtained, patient refused to do standing BP, states he is too dizzy and weak   02/14/21 1106  Orthostatic Lying   BP- Lying (!) 83/69  Pulse- Lying 88  Orthostatic Sitting  BP- Sitting 102/54  Pulse- Sitting (!) 46  Orthostatic Standing at 0 minutes  BP- Standing at 0 minutes  (pt refused)  Orthostatic Standing at 3 minutes  BP- Standing at 3 minutes  (pt refused)

## 2021-02-14 NOTE — Plan of Care (Signed)

## 2021-02-15 NOTE — Progress Notes (Signed)
PROGRESS NOTE    Edward Trevino  WVP:[id number] DOB: 1931/11/07 DOA: 02/10/2021 PCP: Leeanne Rio, MD   Chief Complaint  Patient presents with   Weakness   Brief admission narrative:  As per H&P written Edward Trevino on 01/21/21 Edward Trevino  is a 87 y.o. male, with history of anxiety, dementia, GERD with esophagitis, coronary artery disease, ischemic cardiomyopathy, major depression with history of suicide attempt, and more presents the ED with a chief complaint of decreased p.o. intake.  Unfortunately family has left at time of admission, and patient has dementia and is not able to provide any history.  He reports that he is not in any pain at this time.  He does know his name, but not the time, place, or context.  According to the ED staff, son brought patient in for generalized weakness, and decrease in p.o. intake over the last few days.  ED provider attempted to call son without answer.  Approximately 14 months ago patient was discharged from here after admission for generalized weakness and failure to thrive.  At that time nutritional supplements were started, and was advised to continue to encourage p.o. intake and adequate hydration.  Soft diet was also encouraged to facilitate chewing. Patient is discharged to [facility] was reportedly then transferred to [facility] rehab.  He was there for 9 months, and sent home with son 3 months ago.  In the last 3 months that is reported the patient has been declining, but worse so over the last few days.  In the ED today try to ambulate patient and he required max assist, and would not be safe to discharge home.  No further history could be obtained at this time.   In the ED Temp 97.8, heart rate 57-63, respiratory rate 12, blood pressure 135/67, satting 100% No leukocytosis, hemoglobin is 12.8, chemistry panels mostly unremarkable with a low normal glucose of 82 EKG shows a heart rate of 54, sinus bradycardia, QTC 435 Patient was brought in by EMS  they reported to the ED staff that patient was sent here for decrease in eating and drinking.  Assessment & Plan: 1-failure to thrive/severe protein calorie malnutrition  -Body mass index is 14.33 kg/m. -will continue to follow recommendations by nutritional service -Patient continues demonstrating lack of eating and decrease oral intake in the setting of dementia. -Patient has been seen by physical therapy with recommendations for supervision/assistance 24 hours and  skilled nursing facility placement.  Patient with limited functional mobility secondary to bilateral lower extremity weakness, fatigue, poor balance and deconditioning.  He is requiring maximum assistance; unsafe discharge to home currently. -Continue to encourage PO intake -At this moment plan is for discharge back home with hospice services. -DME's will be arranged and delivered today.  2-CAD/History of STEMI-Oct 2016- conservative Rx -No chest pain -Continue aspirin, statin and heart healthy diet. -Continue holding carvedilol in the setting of bradycardia.  3-P. AFib/Atrial fibrillation -sinus bradycardia currently -Intermittent PVCs appreciated -Continue to follow electrolytes -In the setting of DNR status we will discontinue telemetry.  4-Dementia with behavioral disturbance ([place]) -continue Seroquel and Aricept    DVT prophylaxis: Heparin Code Status: DNR Family Communication: No family at bedside.  Son contacted by [facility] service and plan is for him to go home with hospice.  DME will be arranged and delivered hopefully discharge 02/16/2021.  Disposition:   Status is: Inpatient  Remains inpatient appropriate because:Unsafe d/c plan  Dispo: The patient is from: Home  Anticipated d/c is to: To be determined              Patient currently is not medically stable to d/c.   Difficult to place patient No    Consultants:  Palliative care  Procedures:  See below for x-ray reports.  Antimicrobials:   None   Subjective: Chronically ill in appearance; in no acute distress.  Continues to have decreased/poor oral intake.  No nausea, no vomiting, no chest pain or shortness of breath.  Pleasantly confused.  Objective: Vitals:   02/14/21 0525 02/14/21 1354 02/14/21 2112 02/15/21 0609  BP: 118/66 (!) 92/49 (!) 87/56 96/66  Pulse: 73 61 95 73  Resp: 18  18 19   Temp: 98.2 F (36.8 C) 98.1 F (36.7 C) 98.5 F (36.9 C) 97.9 F (36.6 C)  TempSrc:  Oral Oral Oral  SpO2: 100% 98% 99% 100%  Weight:      Height:        Intake/Output Summary (Last 24 hours) at 02/15/2021 1316 Last data filed at 02/15/2021 6314 Gross per 24 hour  Intake 50 ml  Output 600 ml  Net -550 ml   Filed Weights   02/10/21 1713 02/11/21 0500  Weight: 57.2 kg 52 kg    Examination: General exam: Afebrile, no chest pain, no nausea, no vomiting.  Patient is pleasantly confused and appears to be in no distress.  Continues to have very poor oral intake. Respiratory system: Clear to auscultation. Respiratory effort normal.  Requiring oxygen supplementation. Cardiovascular system:RRR. No murmurs, rubs, gallops.  No JVD. Gastrointestinal system: Abdomen is nondistended, soft and nontender. No organomegaly or masses felt. Normal bowel sounds heard. Central nervous system: Alert and oriented. No focal neurological deficits. Extremities: No cyanosis or clubbing. Skin: No petechiae. Psychiatry: Judgement and insight appear impaired secondary to underlying dementia.  Data Reviewed: I have personally reviewed following labs and imaging studies  CBC: Recent Labs  Lab 02/10/21 1853 02/11/21 0458 02/14/21 0509  WBC 6.5 5.6 6.3  NEUTROABS 4.7  --   --   HGB 12.8* 11.9* 11.8*  HCT 39.1 36.1* 35.0*  MCV 101.0* 101.4* 100.0  PLT 203 217 970    Basic Metabolic Panel: Recent Labs  Lab 02/10/21 1805 02/11/21 0458 02/14/21 0509  NA 145 147* 143  K 3.8 3.7 3.6  CL 116* 111 108  CO2 20* 24 30  GLUCOSE 82 76 97   BUN 26* 26* 32*  CREATININE 1.07 1.02 1.06  CALCIUM 8.9 9.0 8.6*  MG  --   --  2.2    GFR: Estimated Creatinine Clearance: 36.1 mL/min (by C-G formula based on SCr of 1.06 mg/dL).  Liver Function Tests: Recent Labs  Lab 02/10/21 1805 02/11/21 0458  AST 13* 12*  ALT 8 9  ALKPHOS 79 73  BILITOT 1.0 1.0  PROT 6.9 6.5  ALBUMIN 3.6 3.3*    CBG: Recent Labs  Lab 02/12/21 0051  GLUCAP 124*    Recent Results (from the past 240 hour(s))  SARS CORONAVIRUS 2 (TAT 6-24 HRS) Nasopharyngeal Nasopharyngeal Swab     Status: None   Collection Time: 02/10/21  9:58 PM   Specimen: Nasopharyngeal Swab  Result Value Ref Range Status   SARS Coronavirus 2 NEGATIVE NEGATIVE Final    Comment: (NOTE) SARS-CoV-2 target nucleic acids are NOT DETECTED.  The SARS-CoV-2 RNA is generally detectable in upper and lower respiratory specimens during the acute phase of infection. Negative results do not preclude SARS-CoV-2 infection, do not rule out co-infections with  other pathogens, and should not be used as the sole basis for treatment or other patient management decisions. Negative results must be combined with clinical observations, patient history, and epidemiological information. The expected result is Negative.  Fact Sheet for Patients: SugarRoll.be  Fact Sheet for Healthcare Providers: https://www.woods-mathews.com/  This test is not yet approved or cleared by the [place] FDA and  has been authorized for detection and/or diagnosis of SARS-CoV-2 by FDA under an Emergency Use Authorization (EUA). This EUA will remain  in effect (meaning this test can be used) for the duration of the COVID-19 declaration under Se ction 564(b)(1) of the Act, 21 U.S.C. section 360bbb-3(b)(1), unless the authorization is terminated or revoked sooner.  Performed at [facility], [place] [address] [place], [place] [address]      Radiology Studies: No  results found.   Scheduled Meds:  aspirin EC  81 mg Oral Q breakfast   atorvastatin  80 mg Oral q1800   donepezil  10 mg Oral QHS   feeding supplement  237 mL Oral TID BM   ferrous sulfate  325 mg Oral BID WC   heparin  5,000 Units Subcutaneous Q8H   multivitamin with minerals  1 tablet Oral Daily   pantoprazole  40 mg Oral Daily   QUEtiapine  25 mg Oral Daily   cyanocobalamin  1,000 mcg Oral Daily   Vitamin D (Ergocalciferol)  50,000 Units Oral Q Sun   Continuous Infusions:    LOS: 5 days    Time spent: 30 minutes   Barton Dubois, MD [facility]   To contact the attending provider between 7A-7P or the covering provider during after hours 7P-7A, please log into the web site [email or web address] and access using universal [place] password for that web site. If you do not have the password, please call the hospital operator.  02/15/2021, 1:16 PM

## 2021-02-15 NOTE — Progress Notes (Signed)
Physical Therapy Treatment Patient Details Name: LUDIE PAVLIK MRN: [id number] DOB: 04-Dec-1931 Today's Date: 02/15/2021   History of Present Illness Edward Trevino  is a 87 y.o. male, with history of anxiety, dementia, GERD with esophagitis, coronary artery disease, ischemic cardiomyopathy, major depression with history of suicide attempt, and more presents the ED with a chief complaint of decreased p.o. intake.  Unfortunately family has left at time of admission, and patient has dementia and is not able to provide any history.  He reports that he is not in any pain at this time.  He does know his name, but not the time, place, or context.  According to the ED staff, son brought patient in for generalized weakness, and decrease in p.o. intake over the last few days.  ED provider attempted to call son without answer.  Approximately 14 months ago patient was discharged from here after admission for generalized weakness and failure to thrive.  At that time nutritional supplements were started, and was advised to continue to encourage p.o. intake and adequate hydration.  Soft diet was also encouraged to facilitate chewing. Patient is discharged to [facility] was reportedly then transferred to [facility] rehab.  He was there for 9 months, and sent home with son 3 months ago.  In the last 3 months that is reported the patient has been declining, but worse so over the last few days.  In the ED today try to ambulate patient and he required max assist, and would not be safe to discharge home.  No further history could be obtained at this time.    PT Comments    Pt presents alert/awake, pleasantly confused and able to follow one-step directions with verbal/visual cues.  Proceeded with mobility and demonstrates CGA for bed mobility requiring some steadying assistance at EOB to prevent retro-leaning but self-corrects.  Transfer training perofrmed with RW and able to transfer with min-mod A and maintain standing position  for 5-10 seconds before complaints of LE weakness and feeling woozy require return to sitting. Vitals monitored throughout and WNL.  Able to perform 3 trials of sit to stand before requesting back to bed.  Pt tolerated LE activities/exercise without adverse effects.  Continued sessions indicated to improve strength, functional activity tolerance, and progress to ambulation training as able.     Recommendations for follow up therapy are one component of a multi-disciplinary discharge planning process, led by the attending physician.  Recommendations may be updated based on patient status, additional functional criteria and insurance authorization.  Follow Up Recommendations  SNF;Supervision/Assistance - 24 hour     Equipment Recommendations       Recommendations for Other Services       Precautions / Restrictions       Mobility  Bed Mobility Overal bed mobility: Needs Assistance Bed Mobility: Supine to Sit;Sit to Supine     Supine to sit: Min guard Sit to supine: Min guard        Transfers Overall transfer level: Needs assistance Equipment used: Rolling walker (2 wheeled) Transfers: Sit to/from [facility] Sit to Stand: Min assist;Mod assist Stand pivot transfers: Min assist;Mod assist       General transfer comment: unsteadiness and LE weakness limiting standing stability and reports feeling "woozy"  Ambulation/Gait                 Stairs             Wheelchair Mobility    Modified Rankin (Stroke Patients Only)  Balance                                            Cognition                                              Exercises General Exercises - Lower Extremity Ankle Circles/Pumps: AROM;Both;20 reps Quad Sets: Strengthening;Both;20 reps Heel Slides: AROM;Both;20 reps    General Comments        Pertinent Vitals/Pain      Home Living                      Prior  Function            PT Goals (current goals can now be found in the care plan section) Acute Rehab PT Goals Patient Stated Goal: none stated PT Goal Formulation: With patient Time For Goal Achievement: 02/26/21 Potential to Achieve Goals: Fair Progress towards PT goals: Progressing toward goals    Frequency    Min 3X/week      PT Plan Current plan remains appropriate    Co-evaluation              AM-PAC PT "6 Clicks" Mobility   Outcome Measure  Help needed turning from your back to your side while in a flat bed without using bedrails?: A Little Help needed moving from lying on your back to sitting on the side of a flat bed without using bedrails?: A Little Help needed moving to and from a bed to a chair (including a wheelchair)?: A Little Help needed standing up from a chair using your arms (e.g., wheelchair or bedside chair)?: A Little Help needed to walk in hospital room?: A Lot Help needed climbing 3-5 steps with a railing? : Total 6 Click Score: 15    End of Session Equipment Utilized During Treatment: Gait belt Activity Tolerance: Patient limited by fatigue Patient left: in bed;with call bell/phone within reach;with bed alarm set Nurse Communication: Mobility status PT Visit Diagnosis: Unsteadiness on feet (R26.81);Other abnormalities of gait and mobility (R26.89);Muscle weakness (generalized) (M62.81)     Time: 1600-1620 PT Time Calculation (min) (ACUTE ONLY): 20 min  Charges:  $Therapeutic Activity: 8-22 mins                     4:23 PM, 02/15/21 M. Sherlyn Lees, PT, DPT Physical Therapist- [place] Office Number: [phone]

## 2021-02-15 NOTE — TOC Progression Note (Addendum)
Transition of Care [facility]) - Progression Note    Patient Details  Name: Edward Trevino MRN: [id number] Date of Birth: [birth year]  Transition of Care [facility]) CM/SW Contact  Shade Flood, LCSW Phone Number: 02/15/2021, 11:32 AM  Clinical Narrative:     TOC following. This LCSW was able to speak with pt's son by phone today to review dc planning. Son states that based on his conversation with MD yesterday, he would like to have pt come home with hospice care at dc. Discussed CMS hospice provider options and pt's son selects [facility]. Referral made and they will follow up with son.   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  Pt can dc home once DME delivered. He will need EMS transport.  TOC will follow.  Expected Discharge Plan: Long Term Nursing Home Barriers to Discharge: Other (must enter comment) (Home once DME delivered)  Expected Discharge Plan and Services Expected Discharge Plan: Long Term Nursing Home In-house Referral: Clinical Social Work   Post Acute Care Choice: Hospice Living arrangements for the past 2 months: Single Family Home                                       Social Determinants of Health (SDOH) Interventions    Readmission Risk Interventions Readmission Risk Prevention Plan 02/12/2021  Transportation Screening Complete  Home Care Screening Complete  Medication Review (RN CM) Complete  Some recent data might be hidden

## 2021-02-16 DIAGNOSIS — F03918 Unspecified dementia, unspecified severity, with other behavioral disturbance: Secondary | ICD-10-CM

## 2021-02-16 NOTE — Progress Notes (Signed)
Pt refused orthostatic V/S scheduled for 8am today. Pt states he is too tired to do anything right now. Will try again at a later time.

## 2021-02-16 NOTE — Progress Notes (Addendum)
Palliative: Edward Trevino and his family have elected to return home with the benefits of hospice of [facility].  Goals are set.  Transition of care team is working for equipment needs.  Anticipate discharge likely 02/17/2021.  Conference with attending and transition of care team related to patient condition, needs, goals of care, disposition.  Plan: Home with the benefits of hospice of [facility].  DNR status.  No charge Quinn Axe, NP Palliative medicine team Team phone [phone] Greater than 50% of this time was spent counseling and coordinating care related to the above assessment and plan.

## 2021-02-16 NOTE — Progress Notes (Signed)
PROGRESS NOTE    Edward Trevino  XBM:[id number] DOB: [birth year] DOA: 02/10/2021 PCP: Leeanne Rio, MD   Chief Complaint  Patient presents with   Weakness   Brief admission narrative:  As per H&P written [name] on 01/21/21 Edward Trevino  is a 87 y.o. male, with history of anxiety, dementia, GERD with esophagitis, coronary artery disease, ischemic cardiomyopathy, major depression with history of suicide attempt, and more presents the ED with a chief complaint of decreased p.o. intake.  Unfortunately family has left at time of admission, and patient has dementia and is not able to provide any history.  He reports that he is not in any pain at this time.  He does know his name, but not the time, place, or context.  According to the ED staff, son brought patient in for generalized weakness, and decrease in p.o. intake over the last few days.  ED provider attempted to call son without answer.  Approximately 14 months ago patient was discharged from here after admission for generalized weakness and failure to thrive.  At that time nutritional supplements were started, and was advised to continue to encourage p.o. intake and adequate hydration.  Soft diet was also encouraged to facilitate chewing. Patient is discharged to [facility] was reportedly then transferred to [facility] rehab.  He was there for 9 months, and sent home with son 3 months ago.  In the last 3 months that is reported the patient has been declining, but worse so over the last few days.  In the ED today try to ambulate patient and he required max assist, and would not be safe to discharge home.  No further history could be obtained at this time.   In the ED Temp 97.8, heart rate 57-63, respiratory rate 12, blood pressure 135/67, satting 100% No leukocytosis, hemoglobin is 12.8, chemistry panels mostly unremarkable with a low normal glucose of 82 EKG shows a heart rate of 54, sinus bradycardia, QTC 435 Patient was brought in by EMS  they reported to the ED staff that patient was sent here for decrease in eating and drinking.  Assessment & Plan: 1-failure to thrive/severe protein calorie malnutrition  -Body mass index is 14.33 kg/m. -will continue to follow recommendations by nutritional service -Patient continues demonstrating lack of eating and decrease oral intake in the setting of dementia. -Patient has been seen by physical therapy with recommendations for supervision/assistance 24 hours and  skilled nursing facility placement.  Patient with limited functional mobility secondary to bilateral lower extremity weakness, fatigue, poor balance and deconditioning.  He is requiring maximum assistance; unsafe discharge to home currently. -Continue to encourage PO intake -At this moment plan is for discharge back home with hospice services. -DME's will be arranged and delivered on the morning of 02/17/21; family making changes and adjustments in the house to receive patient.  Anticipate discharge tomorrow.  2-CAD/History of STEMI-Oct 2016- conservative Rx -No chest pain -Continue aspirin, statin and heart healthy diet. -Continue holding carvedilol in the setting of bradycardia.  3-P. AFib/Atrial fibrillation -sinus bradycardia currently -Intermittent PVCs appreciated -Continue to follow electrolytes -In the setting of DNR status we will discontinue telemetry.  4-Dementia with behavioral disturbance ([place]) -continue Seroquel and Aricept    DVT prophylaxis: Heparin Code Status: DNR Family Communication: No family at bedside.  [facility] and plan is for him to go home with hospice.  DME will be arranged and delivered hopefully discharge 02/17/2021.  Disposition:   Status is: Inpatient  Remains inpatient appropriate because:Unsafe d/c plan  Dispo: The patient is from: Home              Anticipated d/c is to: To be determined              Patient currently is not medically stable to d/c.   Difficult  to place patient No    Consultants:  Palliative care  Procedures:  See below for x-ray reports.  Antimicrobials:  None   Subjective: Chronically ill in appearance, demonstrating poor judgment and insight.  In no acute distress.  No overnight events.  Objective: Vitals:   02/15/21 0609 02/15/21 1318 02/15/21 2103 02/16/21 0509  BP: 96/66 95/60 (!) 94/55 95/60  Pulse: 73 78 91 (!) 51  Resp: 19 18 18 14   Temp: 97.9 F (36.6 C)  98.9 F (37.2 C) 98.1 F (36.7 C)  TempSrc: Oral  Oral Oral  SpO2: 100% 100% 100% 99%  Weight:      Height:        Intake/Output Summary (Last 24 hours) at 02/16/2021 0902 Last data filed at 02/16/2021 0512 Gross per 24 hour  Intake 600 ml  Output 1001 ml  Net -401 ml   Filed Weights   02/10/21 1713 02/11/21 0500  Weight: 57.2 kg 52 kg    Examination: General exam: Afebrile, no chest pain, no nausea, no vomiting.  No overnight events reported.  Pleasantly confused on exam. Respiratory system: Clear to auscultation. Respiratory effort normal. Cardiovascular system:RRR. No murmurs, rubs, gallops.  No JVD. Gastrointestinal system: Abdomen is nondistended, soft and nontender. No organomegaly or masses felt. Normal bowel sounds heard. Central nervous system: No focal deficits appreciated on exam. Extremities: No cyanosis or clubbing. Skin: No petechiae. Psychiatry: Judgement and insight impaired secondary to underlying dementia.  Stable mood.  Data Reviewed: I have personally reviewed following labs and imaging studies  CBC: Recent Labs  Lab 02/10/21 1853 02/11/21 0458 02/14/21 0509  WBC 6.5 5.6 6.3  NEUTROABS 4.7  --   --   HGB 12.8* 11.9* 11.8*  HCT 39.1 36.1* 35.0*  MCV 101.0* 101.4* 100.0  PLT 203 217 403    Basic Metabolic Panel: Recent Labs  Lab 02/10/21 1805 02/11/21 0458 02/14/21 0509  NA 145 147* 143  K 3.8 3.7 3.6  CL 116* 111 108  CO2 20* 24 30  GLUCOSE 82 76 97  BUN 26* 26* 32*  CREATININE 1.07 1.02 1.06   CALCIUM 8.9 9.0 8.6*  MG  --   --  2.2    GFR: Estimated Creatinine Clearance: 36.1 mL/min (by C-G formula based on SCr of 1.06 mg/dL).  Liver Function Tests: Recent Labs  Lab 02/10/21 1805 02/11/21 0458  AST 13* 12*  ALT 8 9  ALKPHOS 79 73  BILITOT 1.0 1.0  PROT 6.9 6.5  ALBUMIN 3.6 3.3*    CBG: Recent Labs  Lab 02/12/21 0051  GLUCAP 124*    Recent Results (from the past 240 hour(s))  SARS CORONAVIRUS 2 (TAT 6-24 HRS) Nasopharyngeal Nasopharyngeal Swab     Status: None   Collection Time: 02/10/21  9:58 PM   Specimen: Nasopharyngeal Swab  Result Value Ref Range Status   SARS Coronavirus 2 NEGATIVE NEGATIVE Final    Comment: (NOTE) SARS-CoV-2 target nucleic acids are NOT DETECTED.  The SARS-CoV-2 RNA is generally detectable in upper and lower respiratory specimens during the acute phase of infection. Negative results do not preclude SARS-CoV-2 infection, do not rule out co-infections with other pathogens,  and should not be used as the sole basis for treatment or other patient management decisions. Negative results must be combined with clinical observations, patient history, and epidemiological information. The expected result is Negative.  Fact Sheet for Patients: SugarRoll.be  Fact Sheet for Healthcare Providers: https://www.woods-mathews.com/  This test is not yet approved or cleared by the [place] FDA and  has been authorized for detection and/or diagnosis of SARS-CoV-2 by FDA under an Emergency Use Authorization (EUA). This EUA will remain  in effect (meaning this test can be used) for the duration of the COVID-19 declaration under Se ction 564(b)(1) of the Act, 21 U.S.C. section 360bbb-3(b)(1), unless the authorization is terminated or revoked sooner.  Performed at [facility], [place] [address] [place], [place] [address]      Radiology Studies: No results found.   Scheduled Meds:  aspirin  EC  81 mg Oral Q breakfast   atorvastatin  80 mg Oral q1800   donepezil  10 mg Oral QHS   feeding supplement  237 mL Oral TID BM   ferrous sulfate  325 mg Oral BID WC   heparin  5,000 Units Subcutaneous Q8H   multivitamin with minerals  1 tablet Oral Daily   pantoprazole  40 mg Oral Daily   QUEtiapine  25 mg Oral Daily   cyanocobalamin  1,000 mcg Oral Daily   Vitamin D (Ergocalciferol)  50,000 Units Oral Q Sun   Continuous Infusions:    LOS: 6 days    Time spent: 25 minutes   Barton Dubois, MD [facility]   To contact the attending provider between 7A-7P or the covering provider during after hours 7P-7A, please log into the web site [email or web address] and access using universal [place] password for that web site. If you do not have the password, please call the hospital operator.  02/16/2021, 9:02 AM

## 2021-02-16 NOTE — TOC Progression Note (Signed)
Transition of Care [facility]) - Progression Note    Patient Details  Name: Edward Trevino MRN: [id number] Date of Birth: 1931/05/17  Transition [facility] Care [facility]) CM/SW Contact  Salome Arnt,  Phone Number: 02/16/2021, 9:30 AM  Clinical Narrative:  Per hospice, family requesting for DME to be delivered tomorrow morning to allow time to prepare home for equipment and patient. Discussed with MD. Will plan to d/c in AM. TOC will continue to follow.      Expected Discharge Plan: Long Term Nursing Home Barriers to Discharge: Other (must enter comment) (Home once DME delivered)  Expected Discharge Plan and Services Expected Discharge Plan: Long Term Nursing Home In-house Referral: Clinical Social Work   Post Acute Care Choice: Hospice Living arrangements for the past 2 months: Single Family Home                                       Social Determinants of Health (SDOH) Interventions    Readmission Risk Interventions Readmission Risk Prevention Plan 02/12/2021  Transportation Screening Complete  Home Care Screening Complete  Medication Review (RN CM) Complete  Some recent data might be hidden

## 2021-02-17 DIAGNOSIS — Z515 Encounter for palliative care: Secondary | ICD-10-CM

## 2021-02-17 MED ORDER — OXYCODONE HCL 5 MG PO TABS: 5.0000 mg | ORAL_TABLET | ORAL | 0 refills | Status: AC | PRN

## 2021-02-17 MED ORDER — ONDANSETRON HCL 4 MG PO TABS: 4.0000 mg | ORAL_TABLET | Freq: Four times a day (QID) | ORAL | 0 refills | Status: AC | PRN

## 2021-02-17 MED ORDER — ACETAMINOPHEN 325 MG PO TABS: 650.0000 mg | ORAL_TABLET | Freq: Four times a day (QID) | ORAL | 0 refills | Status: AC | PRN

## 2021-02-17 MED ORDER — QUETIAPINE FUMARATE 25 MG PO TABS: 25.0000 mg | ORAL_TABLET | Freq: Every day | ORAL | 0 refills | Status: AC

## 2021-02-17 MED ORDER — ALPRAZOLAM 0.25 MG PO TABS: 0.2500 mg | ORAL_TABLET | Freq: Three times a day (TID) | ORAL | 0 refills | Status: AC | PRN

## 2021-02-17 NOTE — Discharge Instructions (Signed)
-  Discharge home with hospice services with focus on comfort

## 2021-02-17 NOTE — TOC Transition Note (Signed)
Transition of Care [facility]) - CM/SW Discharge Note   Patient Details  Name: Edward Trevino MRN: [id number] Date of Birth: 1932-03-21  [facility] Care [facility]) CM/SW Contact:  Shade Flood, LCSW Phone Number: 02/17/2021, 10:31 AM   Clinical Narrative:     Pt stable for dc per MD. Damaris Schooner with pt's son and with hospice rep. DME being delivered at [facility] transport for 12:00. Updated pt's RN. There are no other TOC needs for dc.  Final next level of care: Home w Hospice Care Barriers to Discharge: Barriers Resolved   Patient Goals and CMS Choice Patient states their goals for this hospitalization and ongoing recovery are:: placement CMS Medicare.gov Compare Post Acute Care list provided to:: Patient Represenative (must comment) Choice offered to / list presented to : Adult Children  Discharge Placement                  Name of family member notified: Tommy Patient and family notified of of transfer: 02/17/21  Discharge Plan and Services In-house Referral: Clinical Social Work   Post Acute Care Choice: Hospice                      HH Agency: [place] Date [place]: 02/15/21   Representative spoke with at [place]: [place] ([place]) Interventions     Readmission Risk Interventions Readmission Risk Prevention Plan 02/17/2021 02/12/2021  Post Dischage Appt Not Complete -  Appt Comments pt going home with hospice -  Medication Screening Complete -  Transportation Screening Complete Complete  Home Care Screening - Complete  Medication Review (RN CM) - Complete  Some recent data might be hidden

## 2021-02-17 NOTE — Discharge Summary (Signed)
Edward Trevino, is a 87 y.o. male  DOB 03-26-1932  MRN [id number].  Admission date:  02/10/2021  Admitting Physician  Rolla Plate, DO  Discharge Date:  02/17/2021   Primary MD  Leeanne Rio, MD  Recommendations for primary care physician for things to follow:   -Discharge home with hospice services with focus on comfort   Admission Diagnosis  Failure to thrive in adult [R62.7]   Discharge Diagnosis  Failure to thrive in adult [R62.7]   Principal Problem:   CAD/History of STEMI-Oct 2016- conservative Rx Active Problems:   PAFib/Atrial fibrillation   Dementia with behavioral disturbance   Palliative care by specialist   DNR (do not resuscitate)   Protein-calorie malnutrition, severe   Failure to thrive in adult   Hospice care patient      Past Medical History:  Diagnosis Date   Anxiety    Bladder stones 05/22/2015   Choledocholithiasis with obstruction 07/25/2012   Dementia ([place])    Depression    Duodenitis 05/11/15   Erosive esophagitis    Esophageal stricture 05/11/15   Dilated   Gastric out let obstruction    Gastric outlet obstruction 05/07/2015   HOH (hard of hearing)    Insomnia    Ischemic cardiomyopathy    LVEF 20%   Multiple duodenal ulcers 05/11/15   2  were present per EGD.   ST elevation myocardial infarction (STEMI) of anterior wall [facility])    October 2016 - late presentation, managed conservatively   Suicide attempt [facility])     Past Surgical History:  Procedure Laterality Date   APPENDECTOMY     BACK SURGERY     BALLOON DILATION N/A 07/24/2012   Procedure: BALLOON DILATION;  Surgeon: Daneil Dolin, MD;  Location: [facility];  Service: Endoscopy;  Laterality: N/A;  Balloon Stone Extraction, Drudging   BIOPSY N/A 07/24/2012   Procedure: BIOPSY;  Surgeon: Daneil Dolin, MD;  Location: [facility];  Service: Endoscopy;  Laterality: N/A;  Duodenal and Esophageal Biopsies    BIOPSY N/A 10/18/2012   Procedure: BIOPSY;  Surgeon: Daneil Dolin, MD;  Location: [facility];  Service: Endoscopy;  Laterality: N/A;   CHOLECYSTECTOMY N/A 10/26/2012   Procedure: LAPAROSCOPIC CHOLECYSTECTOMY;  Surgeon: Jamesetta So, MD;  Location: [facility];  Service: General;  Laterality: N/A;   EGD/ERCP  10/18/2012   Dr. Gala Romney: ;yloric channel stenosis, s/p dilation and bx, persisting CBD stones with extension of sphincterotomy and sphincterotomy balloon dilation and stone extraction. Removal of biliary stent   ERCP N/A 07/24/2012   Dr. Gala Romney. Significant abnormalities of the bowl and proximal second portion producing partial gastric outlet stricture and with secondary gastric dilation and severe erosive reflux esophagitis. Biopsy showed benign ulceration. Normal-appearing ampulla, status post biliary sphincterotomy and ampulla balloon dilation, stone extraction and stent placement.   ERCP N/A 10/18/2012   Procedure: ENDOSCOPIC RETROGRADE CHOLANGIOPANCREATOGRAPHY (ERCP);  Surgeon: Daneil Dolin, MD;  Location: [facility];  Service: Endoscopy;  Laterality: N/A;   ESOPHAGEAL DILATION N/A 05/15/2015  Procedure: ESOPHAGEAL DILATION;  Surgeon: Danie Binder, MD;  Location: [facility];  Service: Endoscopy;  Laterality: N/A;   ESOPHAGOGASTRODUODENOSCOPY N/A 07/24/2012   ZOX:[id number] ampulla s/p biliary sphincterotomy and ampullary   ESOPHAGOGASTRODUODENOSCOPY N/A 05/07/2015   RMR: Severe esophagitis, retained food in the stomach precluded complete exam, pyloric channel/duodenal bulb abnormal with 1 cm ulcer, friability with high-grade gastric outlet obstruction. No dilation performed on Plavix and aspirin.   ESOPHAGOGASTRODUODENOSCOPY N/A 05/11/2015   Rehman: Distal esophageal ulcers with circumferential ulceration at distal 3-4 cm, soft stricture dilated with scope, diffuse erythema and edema in the bulbar mucosa with 2 small ulcers in the distal segment and high-grade stricture in the middle, scope  could not be passed. Dilated up to 12 mm. Could not see duodenum beyond this point because the scope was retained in the large stomach.   ESOPHAGOGASTRODUODENOSCOPY N/A 05/15/2015   SLF: Esophagitis with stricture, dilated GE junction stricture up to 14 mm with the balloon, D1/D2 stricture with narrowing of the lumen to 10-11 mm, dilated up to 15 mm with balloon. Downstream duodenum appear normal. Small duodenal bulbar ulcer. Biopsies from the stomach benign gastritis, no H pylori.   ESOPHAGOGASTRODUODENOSCOPY (EGD) WITH PROPOFOL N/A 10/18/2012   NWG:NFAOZHY channel stenosis s/p dilation/Persisting common bile duct stones with extension of sphincterotomy     and sphincterotomy balloon dilation and stone extraction.  Biliary stent removal    LAPAROSCOPY N/A 10/31/2012   Procedure: LAPAROSCOPY DIAGNOSTIC;  Surgeon: Donato Heinz, MD;  Location: [facility];  Service: General;  Laterality: N/A;   REMOVAL OF STONES N/A 07/24/2012   Procedure: REMOVAL OF STONES;  Surgeon: Daneil Dolin, MD;  Location: [facility];  Service: Endoscopy;  Laterality: N/A;   REMOVAL OF STONES N/A 10/18/2012   Procedure: REMOVAL OF STONES;  Surgeon: Daneil Dolin, MD;  Location: [facility];  Service: Endoscopy;  Laterality: N/A;   SPHINCTEROTOMY N/A 07/24/2012   Procedure: SPHINCTEROTOMY;  Surgeon: Daneil Dolin, MD;  Location: [facility];  Service: Endoscopy;  Laterality: N/A;     HPI  from the history and physical done on the day of admission:    Edward Trevino  is a 87 y.o. male, with history of anxiety, dementia, GERD with esophagitis, coronary artery disease, ischemic cardiomyopathy, major depression with history of suicide attempt, and more presents the ED with a chief complaint of decreased p.o. intake.  Unfortunately family has left at time of admission, and patient has dementia and is not able to provide any history.  He reports that he is not in any pain at this time.  He does know his name, but not the time, place, or context.  According  to the ED staff, son brought patient in for generalized weakness, and decrease in p.o. intake over the last few days.  ED provider attempted to call son without answer.  Approximately 14 months ago patient was discharged from here after admission for generalized weakness and failure to thrive.  At that time nutritional supplements were started, and was advised to continue to encourage p.o. intake and adequate hydration.  Soft diet was also encouraged to facilitate chewing. Patient is discharged to [facility] was reportedly then transferred to [facility] rehab.  He was there for 9 months, and sent home with son 3 months ago.  In the last 3 months that is reported the patient has been declining, but worse so over the last few days.  In the ED today try to ambulate patient and he required  max assist, and would not be safe to discharge home.  No further history could be obtained at this time.   In the ED Temp 97.8, heart rate 57-63, respiratory rate 12, blood pressure 135/67, satting 100% No leukocytosis, hemoglobin is 12.8, chemistry panels mostly unremarkable with a low normal glucose of 82 EKG shows a heart rate of 54, sinus bradycardia, QTC 435 Patient was brought in by EMS they reported to the ED staff that patient was sent here for decrease in eating and drinking.     Hospital Course:   Assessment & Plan:  1)failure to thrive/severe protein calorie malnutrition  -Body mass index is 14.33 kg/m. -Recommend nutritional supplements  2) generalized weakness and deconditioning--  -Patient has been seen by physical therapy with recommendations for supervision/assistance 24 hours and  skilled nursing facility placement.  Patient with limited functional mobility secondary to bilateral lower extremity weakness, fatigue, poor balance and deconditioning.  He is requiring maximum assistance;  -Patient's son request discharge back home with hospice services. -DME's will be arranged and has been delivered to  patient's home on  02/17/21;    3)-CAD/History of STEMI-Oct 2016- conservative Rx -No chest pain -Continue aspirin, statin and heart healthy diet. -Continue holding carvedilol in the setting of bradycardia.   4)-P. AFib/Atrial fibrillation -sinus bradycardia currently -Intermittent PVCs appreciated -Continue to follow electrolytes -In the setting of DNR status we will discontinue telemetry.   5-Dementia with behavioral disturbance ([place]) -continue Seroquel and Aricept   Code Status: DNR Family Communication:    Son contacted by [facility] Team members   -  DME will be arranged and delivered and was on 02/17/2021.   Disposition:   Dispo: The patient is from: Home              Anticipated d/c is to: Home with hospice                 Consultants:  Palliative care/hospice   Procedures:  See below for x-ray reports.    Discharge Condition: Overall prognosis is poor  Follow UP--- hospice team  Diet and Activity recommendation:  As advised  Discharge Instructions    Discharge Instructions     Call MD for:  persistant nausea and vomiting   Complete by: As directed    Call MD for:  severe uncontrolled pain   Complete by: As directed    Call MD for:  temperature >100.4   Complete by: As directed    Diet - low sodium heart healthy   Complete by: As directed    Discharge instructions   Complete by: As directed    -Discharge home with hospice services with focus on comfort   Increase activity slowly   Complete by: As directed          Discharge Medications     Allergies as of 02/17/2021   No Known Allergies      Medication List     STOP taking these medications    aspirin EC 81 MG tablet   cyanocobalamin 1000 MCG tablet   ferrous sulfate 325 (65 FE) MG EC tablet   omeprazole 20 MG capsule Commonly known as: PRILOSEC   pantoprazole 40 MG tablet Commonly known as: Protonix   sertraline 25 MG tablet Commonly known as: ZOLOFT   Vitamin D (Ergocalciferol)  1.25 MG (50000 UNIT) Caps capsule Commonly known as: DRISDOL       TAKE these medications    acetaminophen 325 MG tablet Commonly known as: TYLENOL  Take 2 tablets (650 mg total) by mouth every 6 (six) hours as needed for mild pain (or Fever >/= 101).   ALPRAZolam 0.25 MG tablet Commonly known as: XANAX Take 1 tablet (0.25 mg total) by mouth 3 (three) times daily as needed for anxiety.   carvedilol 3.125 MG tablet Commonly known as: COREG Take 1 tablet (3.125 mg total) by mouth 2 (two) times daily with a meal.   divalproex 125 MG DR tablet Commonly known as: DEPAKOTE Take 125 mg by mouth 2 (two) times daily.   donepezil 10 MG tablet Commonly known as: ARICEPT Take 1 tablet (10 mg total) by mouth at bedtime.   feeding supplement Liqd Take 237 mLs by mouth 2 (two) times daily between meals.   megestrol 40 MG tablet Commonly known as: MEGACE Take 40 mg by mouth daily.   nitroGLYCERIN 0.4 MG SL tablet Commonly known as: Nitrostat Place 1 tablet (0.4 mg total) under the tongue every 5 (five) minutes as needed for chest pain.   ondansetron 4 MG tablet Commonly known as: ZOFRAN Take 1 tablet (4 mg total) by mouth every 6 (six) hours as needed for nausea.   oxyCODONE 5 MG immediate release tablet Commonly known as: Roxicodone Take 1 tablet (5 mg total) by mouth every 4 (four) hours as needed for severe pain.   QUEtiapine 25 MG tablet Commonly known as: SEROQUEL Take 1 tablet (25 mg total) by mouth at bedtime. What changed:  medication strength how much to take when to take this               Durable Medical Equipment  (From admission, onward)           Start     Ordered   02/15/21 1142  For home use only DME Hospital bed  Once       Question Answer Comment  Length of Need Lifetime   The above medical condition requires: Patient requires the ability to reposition immediately   Bed type Semi-electric   Hoyer Lift Yes   Trapeze Bar Yes   Support  Surface: Gel Overlay      02/15/21 1142            Major procedures and Radiology Reports - PLEASE review detailed and final reports for all details, in brief -      No results found.  Micro Results     Recent Results (from the past 240 hour(s))  SARS CORONAVIRUS 2 (TAT 6-24 HRS) Nasopharyngeal Nasopharyngeal Swab     Status: None   Collection Time: 02/10/21  9:58 PM   Specimen: Nasopharyngeal Swab  Result Value Ref Range Status   SARS Coronavirus 2 NEGATIVE NEGATIVE Final    Comment: (NOTE) SARS-CoV-2 target nucleic acids are NOT DETECTED.  The SARS-CoV-2 RNA is generally detectable in upper and lower respiratory specimens during the acute phase of infection. Negative results do not preclude SARS-CoV-2 infection, do not rule out co-infections with other pathogens, and should not be used as the sole basis for treatment or other patient management decisions. Negative results must be combined with clinical observations, patient history, and epidemiological information. The expected result is Negative.  Fact Sheet for Patients: SugarRoll.be  Fact Sheet for Healthcare Providers: https://www.woods-mathews.com/  This test is not yet approved or cleared by the [place] FDA and  has been authorized for detection and/or diagnosis of SARS-CoV-2 by FDA under an Emergency Use Authorization (EUA). This EUA will remain  in effect (meaning this test can  be used) for the duration of the COVID-19 declaration under Se ction 564(b)(1) of the Act, 21 U.S.C. section 360bbb-3(b)(1), unless the authorization is terminated or revoked sooner.  Performed at [facility], [place] [address] [place], [place] [address]        Today   Subjective    Camdin Hegner today has no new complaints -Oral intake is fair but not great          Patient has been seen and examined prior to discharge   Objective   Blood pressure 100/63, pulse  82, temperature 98.6 F (37 C), temperature source Oral, resp. rate 19, height 6\' 3"  (1.905 m), weight 52 kg, SpO2 100 %.   Intake/Output Summary (Last 24 hours) at 02/17/2021 1114 Last data filed at 02/16/2021 1857 Gross per 24 hour  Intake 360 ml  Output --  Net 360 ml    Exam Gen:- Awake Alert, no acute distress  HEENT:- [place].AT, No sclera icterus Neck-Supple Neck,No JVD,.  Lungs-  CTAB , good air movement bilaterally  CV- S1, S2 normal, regular Abd-  +ve B.Sounds, Abd Soft, No tenderness,    Extremity/Skin:- No  edema,   good pulses Psych-underlying cognitive and memory deficits due to dementia  neuro-generalized weakness no new focal deficits, no tremors    Data Review   CBC w Diff:  Lab Results  Component Value Date   WBC 6.3 02/14/2021   HGB 11.8 (L) 02/14/2021   HGB 13.5 10/05/2012   HCT 35.0 (L) 02/14/2021   HCT 40 10/05/2012   PLT 215 02/14/2021   LYMPHOPCT 19 02/10/2021   MONOPCT 6 02/10/2021   EOSPCT 0 02/10/2021   BASOPCT 1 02/10/2021    CMP:  Lab Results  Component Value Date   NA 143 02/14/2021   NA 140 11/21/2019   K 3.6 02/14/2021   K 3.7 10/05/2012   CL 108 02/14/2021   CO2 30 02/14/2021   BUN 32 (H) 02/14/2021   BUN 14 11/21/2019   CREATININE 1.06 02/14/2021   CREATININE 1.28 (H) 09/10/2015   PROT 6.5 02/11/2021   PROT 6.8 11/21/2019   ALBUMIN 3.3 (L) 02/11/2021   ALBUMIN 4.0 11/21/2019   BILITOT 1.0 02/11/2021   BILITOT 0.2 11/21/2019   BILITOT 0.3 10/05/2012   ALKPHOS 73 02/11/2021   ALKPHOS 97 10/05/2012   AST 12 (L) 02/11/2021   AST 23 10/05/2012   ALT 9 02/11/2021  .   Total Discharge time is about 33 minutes  Roxan Hockey M.D on 02/17/2021 at 11:14 AM  Go to [email or web address] -  for contact info  [facility] - Office  [phone]

## 2021-04-11 NOTE — Progress Notes (Deleted)
Primary Care Physician:  Leeanne Rio, MD Primary Gastroenterologist:  Dr. Gala Romney  No chief complaint on file.   HPI:   Edward Trevino is a 87 y.o. male with GI history of GERD, pyloric channel stenosis s/p dilation in 2014, PUD producing gastric outlet obstruction and stricture at D1/D2 s/p dilations in 2016, presenting today for***  Last seen in our office December 2017.  He was doing well at that time without GERD or dysphagia.  Bowels moving well.  He is eating better and has been gaining weight appropriately.  Patient was admitted in September with failure to thrive.  He was brought to emergency room by his son due to decreased p.o. intake.  Patient son reported he had been declining over the last 3 months.  He was recommended to drink nutritional supplements.  Physical therapy also recommended skilled nursing facility placement, but son requested patient to be discharged home with hospice services.  Today:     ?BPE vs Upper GI series with small bowel follow through. Vs egd  Past Medical History:  Diagnosis Date   Anxiety    Bladder stones 05/22/2015   Choledocholithiasis with obstruction 07/25/2012   Dementia ([place])    Depression    Duodenitis 05/11/15   Erosive esophagitis    Esophageal stricture 05/11/15   Dilated   Gastric out let obstruction    Gastric outlet obstruction 05/07/2015   HOH (hard of hearing)    Insomnia    Ischemic cardiomyopathy    LVEF 20%   Multiple duodenal ulcers 05/11/15   2  were present per EGD.   ST elevation myocardial infarction (STEMI) of anterior wall [facility])    October 2016 - late presentation, managed conservatively   Suicide attempt [facility])     Past Surgical History:  Procedure Laterality Date   APPENDECTOMY     BACK SURGERY     BALLOON DILATION N/A 07/24/2012   Procedure: BALLOON DILATION;  Surgeon: Daneil Dolin, MD;  Location: [facility];  Service: Endoscopy;  Laterality: N/A;  Balloon Stone Extraction, Drudging   BIOPSY N/A  07/24/2012   Procedure: BIOPSY;  Surgeon: Daneil Dolin, MD;  Location: [facility];  Service: Endoscopy;  Laterality: N/A;  Duodenal and Esophageal Biopsies   BIOPSY N/A 10/18/2012   Procedure: BIOPSY;  Surgeon: Daneil Dolin, MD;  Location: [facility];  Service: Endoscopy;  Laterality: N/A;   CHOLECYSTECTOMY N/A 10/26/2012   Procedure: LAPAROSCOPIC CHOLECYSTECTOMY;  Surgeon: Jamesetta So, MD;  Location: [facility];  Service: General;  Laterality: N/A;   EGD/ERCP  10/18/2012   Dr. Gala Romney: ;yloric channel stenosis, s/p dilation and bx, persisting CBD stones with extension of sphincterotomy and sphincterotomy balloon dilation and stone extraction. Removal of biliary stent   ERCP N/A 07/24/2012   Dr. Gala Romney. Significant abnormalities of the bowl and proximal second portion producing partial gastric outlet stricture and with secondary gastric dilation and severe erosive reflux esophagitis. Biopsy showed benign ulceration. Normal-appearing ampulla, status post biliary sphincterotomy and ampulla balloon dilation, stone extraction and stent placement.   ERCP N/A 10/18/2012   Procedure: ENDOSCOPIC RETROGRADE CHOLANGIOPANCREATOGRAPHY (ERCP);  Surgeon: Daneil Dolin, MD;  Location: [facility];  Service: Endoscopy;  Laterality: N/A;   ESOPHAGEAL DILATION N/A 05/15/2015   Procedure: ESOPHAGEAL DILATION;  Surgeon: Danie Binder, MD;  Location[facility];  Service: Endoscopy;  Laterality: N/A;   ESOPHAGOGASTRODUODENOSCOPY N/A 07/24/2012   [id number] ampulla s/p biliary sphincterotomy and ampullary   ESOPHAGOGASTRODUODENOSCOPY N/A 05/07/2015  RMR: Severe esophagitis, retained food in the stomach precluded complete exam, pyloric channel/duodenal bulb abnormal with 1 cm ulcer, friability with high-grade gastric outlet obstruction. No dilation performed on Plavix and aspirin.   ESOPHAGOGASTRODUODENOSCOPY N/A 05/11/2015   Rehman: Distal esophageal ulcers with circumferential ulceration at distal 3-4 cm, soft stricture  dilated with scope, diffuse erythema and edema in the bulbar mucosa with 2 small ulcers in the distal segment and high-grade stricture in the middle, scope could not be passed. Dilated up to 12 mm. Could not see duodenum beyond this point because the scope was retained in the large stomach.   ESOPHAGOGASTRODUODENOSCOPY N/A 05/15/2015   SLF: Esophagitis with stricture, dilated GE junction stricture up to 14 mm with the balloon, D1/D2 stricture with narrowing of the lumen to 10-11 mm, dilated up to 15 mm with balloon. Downstream duodenum appear normal. Small duodenal bulbar ulcer. Biopsies from the stomach benign gastritis, no H pylori.   ESOPHAGOGASTRODUODENOSCOPY (EGD) WITH PROPOFOL N/A 10/18/2012   XFG:[id number] channel stenosis s/p dilation/Persisting common bile duct stones with extension of sphincterotomy     and sphincterotomy balloon dilation and stone extraction.  Biliary stent removal    LAPAROSCOPY N/A 10/31/2012   Procedure: LAPAROSCOPY DIAGNOSTIC;  Surgeon: Donato Heinz, MD;  Location: [facility];  Service: General;  Laterality: N/A;   REMOVAL OF STONES N/A 07/24/2012   Procedure: REMOVAL OF STONES;  Surgeon: Daneil Dolin, MD;  Location: [facility];  Service: Endoscopy;  Laterality: N/A;   REMOVAL OF STONES N/A 10/18/2012   Procedure: REMOVAL OF STONES;  Surgeon: Daneil Dolin, MD;  Location: [facility];  Service: Endoscopy;  Laterality: N/A;   SPHINCTEROTOMY N/A 07/24/2012   Procedure: SPHINCTEROTOMY;  Surgeon: Daneil Dolin, MD;  Location: [facility];  Service: Endoscopy;  Laterality: N/A;    Current Outpatient Medications  Medication Sig Dispense Refill   acetaminophen (TYLENOL) 325 MG tablet Take 2 tablets (650 mg total) by mouth every 6 (six) hours as needed for mild pain (or Fever >/= 101). 12 tablet 0   ALPRAZolam (XANAX) 0.25 MG tablet Take 1 tablet (0.25 mg total) by mouth 3 (three) times daily as needed for anxiety. 10 tablet 0   carvedilol (COREG) 3.125 MG tablet Take 1 tablet (3.125 mg total)  by mouth 2 (two) times daily with a meal. 60 tablet 3   divalproex (DEPAKOTE) 125 MG DR tablet Take 125 mg by mouth 2 (two) times daily.     donepezil (ARICEPT) 10 MG tablet Take 1 tablet (10 mg total) by mouth at bedtime. 30 tablet 11   feeding supplement, ENSURE ENLIVE, (ENSURE ENLIVE) LIQD Take 237 mLs by mouth 2 (two) times daily between meals. (Patient not taking: No sig reported)     megestrol (MEGACE) 40 MG tablet Take 40 mg by mouth daily.     nitroGLYCERIN (NITROSTAT) 0.4 MG SL tablet Place 1 tablet (0.4 mg total) under the tongue every 5 (five) minutes as needed for chest pain.     ondansetron (ZOFRAN) 4 MG tablet Take 1 tablet (4 mg total) by mouth every 6 (six) hours as needed for nausea. 20 tablet 0   oxyCODONE (ROXICODONE) 5 MG immediate release tablet Take 1 tablet (5 mg total) by mouth every 4 (four) hours as needed for severe pain. 15 tablet 0   QUEtiapine (SEROQUEL) 25 MG tablet Take 1 tablet (25 mg total) by mouth at bedtime. 30 tablet 0   No current facility-administered medications for this visit.    Allergies  as of 04/12/2021   (No Known Allergies)    Family History  Problem Relation Age of Onset   Depression Sister    Other Mother        unsure of history   Other Father        never had relationship w/ father   Colon cancer Neg Hx    Heart disease Neg Hx     Social History   Socioeconomic History   Marital status: Single    Spouse name: Not on file   Number of children: 5   Years of education: 12   Highest education level: High school graduate  Occupational History   Occupation: Retired  Tobacco Use   Smoking status: Every Day    Types: Cigars   Smokeless tobacco: Never   Tobacco comments:    Smokes 4-5 cigars a day "but I don't really inhale."  Vaping Use   Vaping Use: Never used  Substance and Sexual Activity   Alcohol use: No    Alcohol/week: 0.0 standard drinks   Drug use: Never   Sexual activity: Never  Other Topics Concern   Not on file   Social History Narrative   He lives alone. He has caregiver from Monday-Friday 9-3. He has family that lives on the same property who look in on him at other times.   Right-handed.   Four cups coffee per day.   Social Determinants of Health   Financial Resource Strain: Not on file  Food Insecurity: Not on file  Transportation Needs: Not on file  Physical Activity: Not on file  Stress: Not on file  Social Connections: Not on file  Intimate Partner Violence: Not on file    Review of Systems: Gen: Denies any fever, chills, fatigue, weight loss, lack of appetite.  CV: Denies chest pain, heart palpitations, peripheral edema, syncope.  Resp: Denies shortness of breath at rest or with exertion. Denies wheezing or cough.  GI: Denies dysphagia or odynophagia. Denies jaundice, hematemesis, fecal incontinence. GU : Denies urinary burning, urinary frequency, urinary hesitancy MS: Denies joint pain, muscle weakness, cramps, or limitation of movement.  Derm: Denies rash, itching, dry skin Psych: Denies depression, anxiety, memory loss, and confusion Heme: Denies bruising, bleeding, and enlarged lymph nodes.  Physical Exam: There were no vitals taken for this visit. General:   Alert and oriented. Pleasant and cooperative. Well-nourished and well-developed.  Head:  Normocephalic and atraumatic. Eyes:  Without icterus, sclera clear and conjunctiva pink.  Ears:  Normal auditory acuity. Nose:  No deformity, discharge,  or lesions. Mouth:  No deformity or lesions, oral mucosa pink.  Neck:  Supple, without mass or thyromegaly. Lungs:  Clear to auscultation bilaterally. No wheezes, rales, or rhonchi. No distress.  Heart:  S1, S2 present without murmurs appreciated.  Abdomen:  +BS, soft, non-tender and non-distended. No HSM noted. No guarding or rebound. No masses appreciated.  Rectal:  Deferred  Msk:  Symmetrical without gross deformities. Normal posture. Pulses:  Normal pulses  noted. Extremities:  Without clubbing or edema. Neurologic:  Alert and  oriented x4;  grossly normal neurologically. Skin:  Intact without significant lesions or rashes. Cervical Nodes:  No significant cervical adenopathy. Psych:  Alert and cooperative. Normal mood and affect.

## 2021-04-12 ENCOUNTER — Encounter: Payer: Self-pay | Admitting: Internal Medicine

## 2021-04-12 ENCOUNTER — Ambulatory Visit: Payer: Medicare Other | Admitting: Gastroenterology

## 2021-08-28 ENCOUNTER — Encounter (HOSPITAL_COMMUNITY): Payer: Self-pay

## 2021-08-28 ENCOUNTER — Emergency Department (HOSPITAL_COMMUNITY): Payer: No Typology Code available for payment source

## 2021-08-28 ENCOUNTER — Emergency Department (HOSPITAL_COMMUNITY)
Admission: EM | Admit: 2021-08-28 | Discharge: 2021-08-28 | Disposition: A | Discharge status: Home / Self Care | Payer: No Typology Code available for payment source | Attending: Emergency Medicine | Admitting: Emergency Medicine

## 2021-08-28 DIAGNOSIS — W1839XA Other fall on same level, initial encounter: Secondary | ICD-10-CM | POA: Diagnosis not present

## 2021-08-28 DIAGNOSIS — S01112A Laceration without foreign body of left eyelid and periocular area, initial encounter: Secondary | ICD-10-CM | POA: Diagnosis not present

## 2021-08-28 DIAGNOSIS — S0990XA Unspecified injury of head, initial encounter: Secondary | ICD-10-CM | POA: Diagnosis present

## 2021-08-28 DIAGNOSIS — Y92009 Unspecified place in unspecified non-institutional (private) residence as the place of occurrence of the external cause: Secondary | ICD-10-CM | POA: Insufficient documentation

## 2021-08-28 DIAGNOSIS — W19XXXA Unspecified fall, initial encounter: Secondary | ICD-10-CM

## 2021-08-28 LAB — CBC WITH DIFFERENTIAL/PLATELET
Abs Immature Granulocytes: 0.03 10*3/uL (ref 0.00–0.07)
Basophils Absolute: 0 10*3/uL (ref 0.0–0.1)
Basophils Relative: 1 %
Eosinophils Absolute: 0.1 10*3/uL (ref 0.0–0.5)
Eosinophils Relative: 2 %
HCT: 42.1 % (ref 39.0–52.0)
Hemoglobin: 14.3 g/dL (ref 13.0–17.0)
Immature Granulocytes: 1 %
Lymphocytes Relative: 22 %
Lymphs Abs: 1.4 10*3/uL (ref 0.7–4.0)
MCH: 33.1 pg (ref 26.0–34.0)
MCHC: 34 g/dL (ref 30.0–36.0)
MCV: 97.5 fL (ref 80.0–100.0)
Monocytes Absolute: 0.5 10*3/uL (ref 0.1–1.0)
Monocytes Relative: 8 %
Neutro Abs: 4.4 10*3/uL (ref 1.7–7.7)
Neutrophils Relative %: 66 %
Platelets: 222 10*3/uL (ref 150–400)
RBC: 4.32 MIL/uL (ref 4.22–5.81)
RDW: 12.7 % (ref 11.5–15.5)
WBC: 6.5 10*3/uL (ref 4.0–10.5)
nRBC: 0 % (ref 0.0–0.2)

## 2021-08-28 LAB — BASIC METABOLIC PANEL
Anion gap: 6 (ref 5–15)
BUN: 24 mg/dL — ABNORMAL HIGH (ref 8–23)
CO2: 28 mmol/L (ref 22–32)
Calcium: 9.4 mg/dL (ref 8.9–10.3)
Chloride: 108 mmol/L (ref 98–111)
Creatinine, Ser: 1.34 mg/dL — ABNORMAL HIGH (ref 0.61–1.24)
GFR, Estimated: 51 mL/min — ABNORMAL LOW (ref >60)
Glucose, Bld: 128 mg/dL — ABNORMAL HIGH (ref 70–99)
Potassium: 3.8 mmol/L (ref 3.5–5.1)
Sodium: 142 mmol/L (ref 135–145)

## 2021-08-28 MED ORDER — LIDOCAINE-EPINEPHRINE (PF) 1 %-1:200000 IJ SOLN
10.0000 mL | Freq: Once | INTRAMUSCULAR | Status: DC
  Filled 2021-08-28: qty 30

## 2021-08-28 NOTE — ED Provider Notes (Signed)
?[facility] ?Provider Note ? ? ?CSN: [id number] ?Arrival date & time: 08/28/21  1649 ? ?  ? ?History ?Chief Complaint  ?Patient presents with  ? Fall  ?  Lac above left eye  ? ? ?Edward Trevino is a 88 y.o. male with history of severe dementia who comes to the emergency department today after an unwitnessed fall at home.  Per EMS, patient was at home fell and sustained a laceration above the left eye.  Patient has no complaints currently. ? ? ?Fall ? ? ?  ? ?Home Medications ?Prior to Admission medications   ?Medication Sig Start Date End Date Taking? Authorizing Provider  ?acetaminophen (TYLENOL) 325 MG tablet Take 2 tablets (650 mg total) by mouth every 6 (six) hours as needed for mild pain (or Fever >/= 101). 02/17/21   Roxan Hockey, MD  ?ALPRAZolam Duanne Moron) 0.25 MG tablet Take 1 tablet (0.25 mg total) by mouth 3 (three) times daily as needed for anxiety. 02/17/21   Roxan Hockey, MD  ?carvedilol (COREG) 3.125 MG tablet Take 1 tablet (3.125 mg total) by mouth 2 (two) times daily with a meal. 12/21/18   Emokpae, Courage, MD  ?divalproex (DEPAKOTE) 125 MG DR tablet Take 125 mg by mouth 2 (two) times daily. 02/04/21   [provider]  ?donepezil (ARICEPT) 10 MG tablet Take 1 tablet (10 mg total) by mouth at bedtime. 11/21/19   Marcial Pacas, MD  ?feeding supplement, ENSURE ENLIVE, (ENSURE ENLIVE) LIQD Take 237 mLs by mouth 2 (two) times daily between Trevino. ?Patient not taking: No sig reported 12/05/19   Barton Dubois, MD  ?megestrol (MEGACE) 40 MG tablet Take 40 mg by mouth daily. 02/08/21   [provider]  ?nitroGLYCERIN (NITROSTAT) 0.4 MG SL tablet Place 1 tablet (0.4 mg total) under the tongue every 5 (five) minutes as needed for chest pain. 12/05/19 12/04/20  Barton Dubois, MD  ?ondansetron (ZOFRAN) 4 MG tablet Take 1 tablet (4 mg total) by mouth every 6 (six) hours as needed for nausea. 02/17/21   Roxan Hockey, MD  ?oxyCODONE (ROXICODONE) 5 MG immediate release tablet Take 1  tablet (5 mg total) by mouth every 4 (four) hours as needed for severe pain. 02/17/21   Roxan Hockey, MD  ?QUEtiapine (SEROQUEL) 25 MG tablet Take 1 tablet (25 mg total) by mouth at bedtime. 02/17/21   Roxan Hockey, MD  ?   ? ?Allergies    ?Patient has no known allergies.   ? ?Review of Systems   ?Review of Systems  ?All other systems reviewed and are negative. ? ?Physical Exam ?Updated Vital Signs ?BP 114/64   Pulse 67   Temp 98.3 ?F (36.8 ?C) (Oral)   Resp (!) 29   Ht '6\' 3"'$  (1.905 m)   SpO2 100%   BMI 14.33 kg/m?  ?Physical Exam ?Vitals and nursing note reviewed.  ?Constitutional:   ?   General: He is not in acute distress. ?   Appearance: Normal appearance.  ?   Comments: Chronically ill-appearing.  ?HENT:  ?   Head: Normocephalic.  ?Eyes:  ?   General:     ?   Right eye: No discharge.     ?   Left eye: No discharge.  ?Cardiovascular:  ?   Comments: Regular rate and rhythm.  S1/S2 are distinct without any evidence of murmur, rubs, or gallops.  Radial pulses are 2+ bilaterally.  Dorsalis pedis pulses are 2+ bilaterally.  No evidence of pedal edema. ?Pulmonary:  ?  Comments: Clear to auscultation bilaterally.  Normal effort.  No respiratory distress.  No evidence of wheezes, rales, or rhonchi heard throughout. ?Abdominal:  ?   General: Abdomen is flat. Bowel sounds are normal. There is no distension.  ?   Tenderness: There is no abdominal tenderness. There is no guarding or rebound.  ?Musculoskeletal:     ?   General: Normal range of motion.  ?   Cervical back: Neck supple.  ?Skin: ?   General: Skin is warm and dry.  ?   Findings: No rash.  ?Neurological:  ?   General: No focal deficit present.  ?   Mental Status: He is alert.  ?Psychiatric:     ?   Mood and Affect: Mood normal.     ?   Behavior: Behavior normal.  ? ? ? ? ?ED Results / Procedures / Treatments   ?Labs ?(all labs ordered are listed, but only abnormal results are displayed) ?Labs Reviewed  ?BASIC METABOLIC PANEL - Abnormal; Notable for  the following components:  ?    Result Value  ? Glucose, Bld 128 (*)   ? BUN 24 (*)   ? Creatinine, Ser 1.34 (*)   ? GFR, Estimated 51 (*)   ? All other components within normal limits  ?CBC WITH DIFFERENTIAL/PLATELET  ? ? ?EKG ?None ? ?Radiology ?CT Head Wo Contrast ? ?Result Date: 08/28/2021 ?CLINICAL DATA:  Head trauma, moderate-severe head trauma unwitnessed fall; Neck trauma (Age >= 65y) neck trauma unwitnessed fall EXAM: CT HEAD WITHOUT CONTRAST CT CERVICAL SPINE WITHOUT CONTRAST TECHNIQUE: Multidetector CT imaging of the head and cervical spine was performed following the standard protocol without intravenous contrast. Multiplanar CT image reconstructions of the cervical spine were also generated. RADIATION DOSE REDUCTION: This exam was performed according to the departmental dose-optimization program which includes automated exposure control, adjustment of the mA and/or kV according to patient size and/or use of iterative reconstruction technique. COMPARISON:  CT head and C-spine 11/04/2019, CT C-spine 12/17/2018 FINDINGS: CT HEAD FINDINGS BRAIN: BRAIN Cerebral ventricle sizes are concordant with the degree of cerebral volume loss. Patchy and confluent areas of decreased attenuation are noted throughout the deep and periventricular white matter of the cerebral hemispheres bilaterally, compatible with chronic microvascular ischemic disease. No evidence of large-territorial acute infarction. No parenchymal hemorrhage. No mass lesion. No extra-axial collection. No mass effect or midline shift. No hydrocephalus. Basilar cisterns are patent. Vascular: No hyperdense vessel. Skull: No acute fracture or focal lesion. Sinuses/Orbits: Paranasal sinuses and mastoid air cells are clear. Left lens replacement. Otherwise orbits are unremarkable. Other: None. CT CERVICAL SPINE FINDINGS Alignment: Normal. Skull base and vertebrae: Multilevel severe degenerative changes of spine with associated severe osseous neural foraminal  stenosis at the C4-C5 level. No acute fracture. No aggressive appearing focal osseous lesion or focal pathologic process. Soft tissues and spinal canal: No prevertebral fluid or swelling. No visible canal hematoma. Upper chest: Persistent marked biapical pleural/pulmonary scarring. Other: None. IMPRESSION: 1. No acute intracranial abnormality. 2. No acute displaced fracture or traumatic listhesis of the cervical spine. Electronically Signed   By: Iven Finn M.D.   On: 08/28/2021 17:36  ? ?CT Cervical Spine Wo Contrast ? ?Result Date: 08/28/2021 ?CLINICAL DATA:  Head trauma, moderate-severe head trauma unwitnessed fall; Neck trauma (Age >= 65y) neck trauma unwitnessed fall EXAM: CT HEAD WITHOUT CONTRAST CT CERVICAL SPINE WITHOUT CONTRAST TECHNIQUE: Multidetector CT imaging of the head and cervical spine was performed following the standard protocol without intravenous contrast. Multiplanar CT  image reconstructions of the cervical spine were also generated. RADIATION DOSE REDUCTION: This exam was performed according to the departmental dose-optimization program which includes automated exposure control, adjustment of the mA and/or kV according to patient size and/or use of iterative reconstruction technique. COMPARISON:  CT head and C-spine 11/04/2019, CT C-spine 12/17/2018 FINDINGS: CT HEAD FINDINGS BRAIN: BRAIN Cerebral ventricle sizes are concordant with the degree of cerebral volume loss. Patchy and confluent areas of decreased attenuation are noted throughout the deep and periventricular white matter of the cerebral hemispheres bilaterally, compatible with chronic microvascular ischemic disease. No evidence of large-territorial acute infarction. No parenchymal hemorrhage. No mass lesion. No extra-axial collection. No mass effect or midline shift. No hydrocephalus. Basilar cisterns are patent. Vascular: No hyperdense vessel. Skull: No acute fracture or focal lesion. Sinuses/Orbits: Paranasal sinuses and  mastoid air cells are clear. Left lens replacement. Otherwise orbits are unremarkable. Other: None. CT CERVICAL SPINE FINDINGS Alignment: Normal. Skull base and vertebrae: Multilevel severe degenerative changes of

## 2021-08-28 NOTE — ED Notes (Signed)
Pt son's Konrad Dolores is on the way to pick him up.  ?

## 2021-08-28 NOTE — ED Notes (Signed)
Lac cleansed and covered with clean, dry gauze ?

## 2021-08-28 NOTE — ED Triage Notes (Signed)
Pt brought in by [facility] for unwitnessed fall. Lac above left eye, dressed by EMS. Pt is a poor historian, hx of dementia. ?

## 2021-08-28 NOTE — Discharge Instructions (Addendum)
Please return to the emergency department or your primary care provider in 5 days for suture removal.  There were a total of 10 sutures in place.  Please return to the emergency room for any worsening symptoms you might have. ?

## 2021-08-28 NOTE — ED Notes (Signed)
Pt returned from CT °

## 2021-08-28 NOTE — ED Notes (Signed)
Patient transported to CT 

## 2021-09-24 ENCOUNTER — Emergency Department (HOSPITAL_COMMUNITY): Payer: No Typology Code available for payment source

## 2021-09-24 ENCOUNTER — Emergency Department (HOSPITAL_COMMUNITY)
Admission: EM | Admit: 2021-09-24 | Discharge: 2021-09-24 | Disposition: A | Discharge status: Home / Self Care | Payer: No Typology Code available for payment source | Attending: Emergency Medicine | Admitting: Emergency Medicine

## 2021-09-24 ENCOUNTER — Encounter (HOSPITAL_COMMUNITY): Payer: Self-pay | Admitting: Emergency Medicine

## 2021-09-24 ENCOUNTER — Other Ambulatory Visit: Payer: Self-pay

## 2021-09-24 DIAGNOSIS — S0181XA Laceration without foreign body of other part of head, initial encounter: Secondary | ICD-10-CM | POA: Insufficient documentation

## 2021-09-24 DIAGNOSIS — S0990XA Unspecified injury of head, initial encounter: Secondary | ICD-10-CM | POA: Diagnosis present

## 2021-09-24 DIAGNOSIS — W01198A Fall on same level from slipping, tripping and stumbling with subsequent striking against other object, initial encounter: Secondary | ICD-10-CM | POA: Diagnosis not present

## 2021-09-24 DIAGNOSIS — F0390 Unspecified dementia without behavioral disturbance: Secondary | ICD-10-CM | POA: Insufficient documentation

## 2021-09-24 DIAGNOSIS — W19XXXA Unspecified fall, initial encounter: Secondary | ICD-10-CM

## 2021-09-24 MED ORDER — OXYCODONE-ACETAMINOPHEN 5-325 MG PO TABS
1.0000 | ORAL_TABLET | Freq: Once | ORAL | Status: AC
  Administered 2021-09-24: 1 via ORAL
  Filled 2021-09-24: qty 1

## 2021-09-24 MED ORDER — OXYCODONE-ACETAMINOPHEN 5-325 MG PO TABS: 1.0000 | ORAL_TABLET | Freq: Three times a day (TID) | ORAL | 0 refills | Status: AC | PRN

## 2021-09-24 NOTE — ED Notes (Signed)
Spoke to pt's daughter in law, [place], regarding pt's disposition. The plan is to call whether pt is admitted or discharged ?

## 2021-09-24 NOTE — Discharge Instructions (Addendum)
Patient can follow-up to get his staples out with his family doctor in 7 to 10 days ?

## 2021-09-24 NOTE — ED Notes (Signed)
Per EDP, bandaid applied to shallow cut above left eye ?

## 2021-09-24 NOTE — ED Provider Notes (Signed)
?[place] [facility] ?Provider Note ? ? ?CSN: [id number] ?Arrival date & time: 09/24/21  1614 ? ?  ? ?History ? ?Chief Complaint  ?Patient presents with  ? Fall  ? ? ?Edward Trevino is a 88 y.o. male. ? ?Patient fell and hit his head.  Patient has a history of dementia ? ?The history is provided by the patient and medical records. No language interpreter was used.  ?Fall ?This is a new problem. The current episode started 3 to 5 hours ago. The problem occurs constantly. The problem has been resolved. Pertinent negatives include no chest pain. Nothing aggravates the symptoms. Nothing relieves the symptoms. He has tried nothing for the symptoms. The treatment provided no relief.  ? ?  ? ?Home Medications ?Prior to Admission medications   ?Medication Sig Start Date End Date Taking? Authorizing Provider  ?ATIVAN 0.5 MG tablet Take 0.5-1 mg by mouth every 3 (three) hours as needed. 09/15/21  Yes [provider]  ?carvedilol (COREG) 3.125 MG tablet Take 1 tablet (3.125 mg total) by mouth 2 (two) times daily with a meal. 12/21/18  Yes Emokpae, Courage, MD  ?divalproex (DEPAKOTE) 125 MG DR tablet Take 125 mg by mouth 2 (two) times daily. 02/04/21  Yes [provider]  ?donepezil (ARICEPT) 10 MG tablet Take 1 tablet (10 mg total) by mouth at bedtime. 11/21/19  Yes Marcial Pacas, MD  ?megestrol (MEGACE) 40 MG tablet Take 40 mg by mouth daily. 02/08/21  Yes [provider]  ?ondansetron (ZOFRAN) 4 MG tablet Take 1 tablet (4 mg total) by mouth every 6 (six) hours as needed for nausea. 02/17/21  Yes Roxan Hockey, MD  ?oxyCODONE (ROXICODONE) 5 MG immediate release tablet Take 1 tablet (5 mg total) by mouth every 4 (four) hours as needed for severe pain. 02/17/21  Yes Roxan Hockey, MD  ?oxyCODONE-acetaminophen (PERCOCET) 5-325 MG tablet Take 1 tablet by mouth every 8 (eight) hours as needed. 09/24/21  Yes Milton Ferguson, MD  ?acetaminophen (TYLENOL) 325 MG tablet Take 2 tablets (650 mg total) by  mouth every 6 (six) hours as needed for mild pain (or Fever >/= 101). ?Patient not taking: Reported on 09/24/2021 02/17/21   Roxan Hockey, MD  ?ALPRAZolam Duanne Moron) 0.25 MG tablet Take 1 tablet (0.25 mg total) by mouth 3 (three) times daily as needed for anxiety. ?Patient not taking: Reported on 09/24/2021 02/17/21   Roxan Hockey, MD  ?feeding supplement, ENSURE ENLIVE, (ENSURE ENLIVE) LIQD Take 237 mLs by mouth 2 (two) times daily between meals. ?Patient not taking: Reported on 02/11/2021 12/05/19   Barton Dubois, MD  ?nitroGLYCERIN (NITROSTAT) 0.4 MG SL tablet Place 1 tablet (0.4 mg total) under the tongue every 5 (five) minutes as needed for chest pain. ?Patient not taking: Reported on 09/24/2021 12/05/19 12/04/20  Barton Dubois, MD  ?QUEtiapine (SEROQUEL) 25 MG tablet Take 1 tablet (25 mg total) by mouth at bedtime. ?Patient not taking: Reported on 09/24/2021 02/17/21   Roxan Hockey, MD  ?   ? ?Allergies    ?Patient has no known allergies.   ? ?Review of Systems   ?Review of Systems  ?Unable to perform ROS: Dementia  ?Cardiovascular:  Negative for chest pain.  ? ?Physical Exam ?Updated Vital Signs ?BP 138/67   Pulse 68   Temp 98 ?F (36.7 ?C) (Oral)   Resp 17   Ht '6\' 3"'$  (1.905 m)   Wt 52 kg   SpO2 100%   BMI 14.33 kg/m?  ?Physical Exam ?Vitals and nursing note  reviewed.  ?Constitutional:   ?   Appearance: He is well-developed.  ?HENT:  ?   Head: Normocephalic.  ?   Comments: 3 cm laceration occipital head ?   Nose: Nose normal.  ?Eyes:  ?   General: No scleral icterus. ?   Conjunctiva/sclera: Conjunctivae normal.  ?Neck:  ?   Thyroid: No thyromegaly.  ?Cardiovascular:  ?   Rate and Rhythm: Normal rate and regular rhythm.  ?   Heart sounds: No murmur heard. ?  No friction rub. No gallop.  ?Pulmonary:  ?   Breath sounds: No stridor. No wheezing or rales.  ?Chest:  ?   Chest wall: No tenderness.  ?Abdominal:  ?   General: There is no distension.  ?   Tenderness: There is no abdominal tenderness. There is no  rebound.  ?Musculoskeletal:     ?   General: Normal range of motion.  ?   Cervical back: Neck supple.  ?Lymphadenopathy:  ?   Cervical: No cervical adenopathy.  ?Skin: ?   Findings: No erythema or rash.  ?Neurological:  ?   Mental Status: He is alert.  ?   Motor: No abnormal muscle tone.  ?   Coordination: Coordination normal.  ?   Comments: Oriented to person only  ?Psychiatric:     ?   Behavior: Behavior normal.  ? ? ?ED Results / Procedures / Treatments   ?Labs ?(all labs ordered are listed, but only abnormal results are displayed) ?Labs Reviewed - No data to display ? ?EKG ?None ? ?Radiology ?CT Head Wo Contrast ? ?Result Date: 09/24/2021 ?CLINICAL DATA:  Trauma, fall EXAM: CT HEAD WITHOUT CONTRAST TECHNIQUE: Contiguous axial images were obtained from the base of the skull through the vertex without intravenous contrast. RADIATION DOSE REDUCTION: This exam was performed according to the departmental dose-optimization program which includes automated exposure control, adjustment of the mA and/or kV according to patient size and/or use of iterative reconstruction technique. COMPARISON:  08/28/2021 FINDINGS: Brain: No acute intracranial findings are seen in the noncontrast CT brain. Cortical sulci are prominent. There is decreased density in the periventricular and subcortical white matter. Ventricles are not dilated. There are no epidural or subdural fluid collections. Vascular: Unremarkable. Skull: No fracture is seen in the calvarium. There is subcutaneous contusion/hematoma in the frontal scalp. Sinuses/Orbits: There is mucosal thickening in the ethmoid and sphenoid sinuses. Right maxillary antrum is smaller than left. Other: None IMPRESSION: No acute intracranial findings are seen in noncontrast CT brain. Atrophy. Small-vessel disease. There is subcutaneous stranding/hematoma in the frontal scalp. No fracture is seen in the calvarium. Electronically Signed   By: Elmer Picker M.D.   On: 09/24/2021 18:33   ? ?CT Cervical Spine Wo Contrast ? ?Result Date: 09/24/2021 ?CLINICAL DATA:  Trauma, fall EXAM: CT CERVICAL SPINE WITHOUT CONTRAST TECHNIQUE: Multidetector CT imaging of the cervical spine was performed without intravenous contrast. Multiplanar CT image reconstructions were also generated. RADIATION DOSE REDUCTION: This exam was performed according to the departmental dose-optimization program which includes automated exposure control, adjustment of the mA and/or kV according to patient size and/or use of iterative reconstruction technique. COMPARISON:  08/28/2021 FINDINGS: Alignment: Alignment of posterior margins of vertebral bodies is unremarkable. There is mild levoscoliosis. Skull base and vertebrae: No recent fracture is seen. There are few small smooth marginated calcifications posterior to the lower cervical spine suggesting ligament calcifications from previous injury. Marked degenerative changes are noted at the articulation of anterior arch of C1 and odontoid process. Degenerative  changes are noted in the cervical spine with bony spurs at multiple levels. Schmorl's node is seen in the upper endplate of body of T2 vertebra. Soft tissues and spinal canal: Posterior bony spurs causing extrinsic pressure over the ventral margin of thecal sac. There is mild spinal stenosis at C4-C5 and C5-C6 levels. There is fusion of bodies of C5, C6 and C7 vertebrae, possibly postsurgical. Disc levels: There is encroachment of neural foramina from C2 to T1 levels. Upper chest: Marked fibrotic changes are noted in both apices. Possible left pleural effusion is present. Other: None IMPRESSION: No recent fracture is seen in the cervical spine. Cervical spondylosis with encroachment of neural foramina from C2-T1 levels. Significant scarring is seen in the visualized apical portions of both lungs. Electronically Signed   By: Elmer Picker M.D.   On: 09/24/2021 18:40   ? ?Procedures ?Marland Kitchen.Laceration Repair ? ?Date/Time:  09/26/2021 10:30 AM ?Performed by: Milton Ferguson, MD ?Authorized by: Milton Ferguson, MD  ? ?Comments:  ?   Patient is a 3 cm superficial laceration to occipital head.  Area was cleaned thoroughly with Betadine.  5 stapl

## 2021-09-24 NOTE — ED Notes (Signed)
Assisted pt use urinal ?

## 2021-09-24 NOTE — ED Triage Notes (Addendum)
Pt to the ED after an unwitnessed fall in which he struck his head on the nightstand. ? ?The pt is on blood thinners, has a history of dementia, and falls. ?Pt has a cervical collar in place with bandages on his head with bleeding controlled. ? ?

## 2021-09-24 NOTE — ED Notes (Signed)
Pt cleaned up and put in paper scrubs for discharge.  ?

## 2021-10-14 DEATH — deceased
